# Patient Record
Sex: Male | Born: 1945 | Race: Black or African American | Hispanic: No | Smoking: Former smoker
Health system: Southern US, Community
[De-identification: ages and names within clinical notes are randomized; demographics above are authoritative.]

## PROBLEM LIST (undated history)

## (undated) ENCOUNTER — Emergency Department (HOSPITAL_COMMUNITY): Payer: Commercial Managed Care - HMO | Source: Home / Self Care

## (undated) DIAGNOSIS — J189 Pneumonia, unspecified organism: Secondary | ICD-10-CM

## (undated) DIAGNOSIS — I10 Essential (primary) hypertension: Secondary | ICD-10-CM

## (undated) DIAGNOSIS — Z86010 Personal history of colonic polyps: Secondary | ICD-10-CM

## (undated) DIAGNOSIS — I219 Acute myocardial infarction, unspecified: Secondary | ICD-10-CM

## (undated) DIAGNOSIS — I255 Ischemic cardiomyopathy: Secondary | ICD-10-CM

## (undated) DIAGNOSIS — Z9111 Patient's noncompliance with dietary regimen: Secondary | ICD-10-CM

## (undated) DIAGNOSIS — Z91119 Patient's noncompliance with dietary regimen due to unspecified reason: Secondary | ICD-10-CM

## (undated) DIAGNOSIS — G4733 Obstructive sleep apnea (adult) (pediatric): Secondary | ICD-10-CM

## (undated) DIAGNOSIS — Z9981 Dependence on supplemental oxygen: Secondary | ICD-10-CM

## (undated) DIAGNOSIS — E7800 Pure hypercholesterolemia, unspecified: Secondary | ICD-10-CM

## (undated) DIAGNOSIS — I2510 Atherosclerotic heart disease of native coronary artery without angina pectoris: Secondary | ICD-10-CM

## (undated) DIAGNOSIS — Z8719 Personal history of other diseases of the digestive system: Secondary | ICD-10-CM

## (undated) DIAGNOSIS — R0602 Shortness of breath: Secondary | ICD-10-CM

## (undated) DIAGNOSIS — Z8711 Personal history of peptic ulcer disease: Secondary | ICD-10-CM

## (undated) DIAGNOSIS — K859 Acute pancreatitis without necrosis or infection, unspecified: Secondary | ICD-10-CM

## (undated) DIAGNOSIS — E119 Type 2 diabetes mellitus without complications: Secondary | ICD-10-CM

## (undated) DIAGNOSIS — M1990 Unspecified osteoarthritis, unspecified site: Secondary | ICD-10-CM

## (undated) DIAGNOSIS — K922 Gastrointestinal hemorrhage, unspecified: Secondary | ICD-10-CM

## (undated) DIAGNOSIS — F1021 Alcohol dependence, in remission: Secondary | ICD-10-CM

## (undated) DIAGNOSIS — I350 Nonrheumatic aortic (valve) stenosis: Secondary | ICD-10-CM

## (undated) DIAGNOSIS — K219 Gastro-esophageal reflux disease without esophagitis: Secondary | ICD-10-CM

## (undated) DIAGNOSIS — C8590 Non-Hodgkin lymphoma, unspecified, unspecified site: Secondary | ICD-10-CM

## (undated) DIAGNOSIS — K5790 Diverticulosis of intestine, part unspecified, without perforation or abscess without bleeding: Secondary | ICD-10-CM

## (undated) DIAGNOSIS — I5042 Chronic combined systolic (congestive) and diastolic (congestive) heart failure: Secondary | ICD-10-CM

## (undated) DIAGNOSIS — Z9989 Dependence on other enabling machines and devices: Secondary | ICD-10-CM

## (undated) HISTORY — DX: Personal history of colonic polyps: Z86.010

## (undated) HISTORY — DX: Pneumonia, unspecified organism: J18.9

## (undated) HISTORY — DX: Acute pancreatitis without necrosis or infection, unspecified: K85.90

## (undated) HISTORY — DX: Alcohol dependence, in remission: F10.21

## (undated) HISTORY — DX: Diverticulosis of intestine, part unspecified, without perforation or abscess without bleeding: K57.90

## (undated) HISTORY — DX: Non-Hodgkin lymphoma, unspecified, unspecified site: C85.90

---

## 1979-03-19 DIAGNOSIS — K922 Gastrointestinal hemorrhage, unspecified: Secondary | ICD-10-CM

## 1979-03-19 HISTORY — DX: Gastrointestinal hemorrhage, unspecified: K92.2

## 1998-02-09 ENCOUNTER — Other Ambulatory Visit: Admission: RE | Admit: 1998-02-09 | Discharge: 1998-02-09 | Payer: Self-pay | Admitting: Family Medicine

## 2011-08-07 HISTORY — PX: CARDIAC CATHETERIZATION: SHX172

## 2012-05-18 DIAGNOSIS — I219 Acute myocardial infarction, unspecified: Secondary | ICD-10-CM

## 2012-05-18 DIAGNOSIS — J189 Pneumonia, unspecified organism: Secondary | ICD-10-CM

## 2012-05-18 HISTORY — DX: Acute myocardial infarction, unspecified: I21.9

## 2012-05-18 HISTORY — DX: Pneumonia, unspecified organism: J18.9

## 2012-05-31 ENCOUNTER — Other Ambulatory Visit: Payer: Self-pay | Admitting: Internal Medicine

## 2012-05-31 ENCOUNTER — Other Ambulatory Visit: Payer: Self-pay | Admitting: *Deleted

## 2012-06-05 ENCOUNTER — Other Ambulatory Visit: Payer: Self-pay | Admitting: Internal Medicine

## 2012-06-06 ENCOUNTER — Ambulatory Visit (HOSPITAL_COMMUNITY)
Admission: RE | Admit: 2012-06-06 | Discharge: 2012-06-07 | Disposition: A | Discharge status: Home / Self Care | Payer: Medicare Other | Source: Ambulatory Visit | Attending: Internal Medicine | Admitting: Internal Medicine

## 2012-06-06 ENCOUNTER — Encounter (HOSPITAL_COMMUNITY): Payer: Self-pay | Admitting: General Practice

## 2012-06-06 ENCOUNTER — Encounter (HOSPITAL_COMMUNITY)
Admission: RE | Disposition: A | Discharge status: Home / Self Care | Payer: Self-pay | Source: Ambulatory Visit | Attending: Internal Medicine

## 2012-06-06 DIAGNOSIS — Z955 Presence of coronary angioplasty implant and graft: Secondary | ICD-10-CM

## 2012-06-06 DIAGNOSIS — I255 Ischemic cardiomyopathy: Secondary | ICD-10-CM

## 2012-06-06 DIAGNOSIS — E119 Type 2 diabetes mellitus without complications: Secondary | ICD-10-CM

## 2012-06-06 DIAGNOSIS — D72829 Elevated white blood cell count, unspecified: Secondary | ICD-10-CM | POA: Insufficient documentation

## 2012-06-06 DIAGNOSIS — I350 Nonrheumatic aortic (valve) stenosis: Secondary | ICD-10-CM | POA: Diagnosis present

## 2012-06-06 DIAGNOSIS — I5043 Acute on chronic combined systolic (congestive) and diastolic (congestive) heart failure: Secondary | ICD-10-CM

## 2012-06-06 DIAGNOSIS — I2510 Atherosclerotic heart disease of native coronary artery without angina pectoris: Secondary | ICD-10-CM

## 2012-06-06 DIAGNOSIS — E785 Hyperlipidemia, unspecified: Secondary | ICD-10-CM | POA: Insufficient documentation

## 2012-06-06 DIAGNOSIS — I2589 Other forms of chronic ischemic heart disease: Secondary | ICD-10-CM | POA: Insufficient documentation

## 2012-06-06 DIAGNOSIS — F17200 Nicotine dependence, unspecified, uncomplicated: Secondary | ICD-10-CM | POA: Diagnosis present

## 2012-06-06 DIAGNOSIS — I509 Heart failure, unspecified: Secondary | ICD-10-CM

## 2012-06-06 DIAGNOSIS — I359 Nonrheumatic aortic valve disorder, unspecified: Secondary | ICD-10-CM | POA: Insufficient documentation

## 2012-06-06 DIAGNOSIS — I10 Essential (primary) hypertension: Secondary | ICD-10-CM

## 2012-06-06 HISTORY — DX: Obstructive sleep apnea (adult) (pediatric): G47.33

## 2012-06-06 HISTORY — DX: Pure hypercholesterolemia, unspecified: E78.00

## 2012-06-06 HISTORY — PX: PERCUTANEOUS CORONARY STENT INTERVENTION (PCI-S): SHX5485

## 2012-06-06 HISTORY — DX: Unspecified osteoarthritis, unspecified site: M19.90

## 2012-06-06 HISTORY — DX: Ischemic cardiomyopathy: I25.5

## 2012-06-06 HISTORY — DX: Shortness of breath: R06.02

## 2012-06-06 HISTORY — DX: Essential (primary) hypertension: I10

## 2012-06-06 HISTORY — DX: Dependence on other enabling machines and devices: Z99.89

## 2012-06-06 HISTORY — PX: CORONARY ANGIOPLASTY WITH STENT PLACEMENT: SHX49

## 2012-06-06 HISTORY — DX: Gastrointestinal hemorrhage, unspecified: K92.2

## 2012-06-06 HISTORY — DX: Type 2 diabetes mellitus without complications: E11.9

## 2012-06-06 HISTORY — PX: LEFT AND RIGHT HEART CATHETERIZATION WITH CORONARY ANGIOGRAM: SHX5449

## 2012-06-06 LAB — POCT ACTIVATED CLOTTING TIME: Activated Clotting Time: 364 seconds

## 2012-06-06 LAB — POCT I-STAT 3, VENOUS BLOOD GAS (G3P V)
Acid-base deficit: 1 mmol/L (ref 0.0–2.0)
O2 Saturation: 60 %

## 2012-06-06 LAB — HEMOGLOBIN A1C: Mean Plasma Glucose: 177 mg/dL — ABNORMAL HIGH (ref <117)

## 2012-06-06 LAB — POCT I-STAT 3, ART BLOOD GAS (G3+): pH, Arterial: 7.408 (ref 7.350–7.450)

## 2012-06-06 LAB — GLUCOSE, CAPILLARY
Glucose-Capillary: 110 mg/dL — ABNORMAL HIGH (ref 70–99)
Glucose-Capillary: 194 mg/dL — ABNORMAL HIGH (ref 70–99)

## 2012-06-06 SURGERY — LEFT AND RIGHT HEART CATHETERIZATION WITH CORONARY ANGIOGRAM
Anesthesia: LOCAL

## 2012-06-06 MED ORDER — MIDAZOLAM HCL 2 MG/2ML IJ SOLN
INTRAMUSCULAR | Status: AC
  Filled 2012-06-06: qty 2

## 2012-06-06 MED ORDER — INSULIN ASPART 100 UNIT/ML ~~LOC~~ SOLN
0.0000 [IU] | SUBCUTANEOUS | Status: DC
  Administered 2012-06-06 – 2012-06-07 (×2): 2 [IU] via SUBCUTANEOUS
  Administered 2012-06-07: 1 [IU] via SUBCUTANEOUS
  Administered 2012-06-07: 2 [IU] via SUBCUTANEOUS
  Administered 2012-06-07: 09:00:00 1 [IU] via SUBCUTANEOUS

## 2012-06-06 MED ORDER — SODIUM CHLORIDE 0.9 % IV SOLN
INTRAVENOUS | Status: DC
  Administered 2012-06-06: 16:00:00 via INTRAVENOUS

## 2012-06-06 MED ORDER — ACETAMINOPHEN 325 MG PO TABS: 650.0000 mg | ORAL_TABLET | ORAL | Status: DC | PRN

## 2012-06-06 MED ORDER — LORAZEPAM 0.5 MG PO TABS: 2.0000 mg | ORAL_TABLET | Freq: Four times a day (QID) | ORAL | Status: DC | PRN

## 2012-06-06 MED ORDER — BIVALIRUDIN 250 MG IV SOLR
INTRAVENOUS | Status: AC
  Filled 2012-06-06: qty 250

## 2012-06-06 MED ORDER — FENTANYL CITRATE 0.05 MG/ML IJ SOLN
INTRAMUSCULAR | Status: AC
  Filled 2012-06-06: qty 2

## 2012-06-06 MED ORDER — FUROSEMIDE 20 MG PO TABS: 20.0000 mg | ORAL_TABLET | Freq: Every day | ORAL | Status: DC

## 2012-06-06 MED ORDER — NITROGLYCERIN 0.2 MG/ML ON CALL CATH LAB
INTRAVENOUS | Status: AC
  Filled 2012-06-06: qty 1

## 2012-06-06 MED ORDER — PRASUGREL HCL 10 MG PO TABS
10.0000 mg | ORAL_TABLET | Freq: Every day | ORAL | Status: DC
  Administered 2012-06-07: 10 mg via ORAL
  Filled 2012-06-06 (×2): qty 1

## 2012-06-06 MED ORDER — SODIUM CHLORIDE 0.9 % IJ SOLN: 3.0000 mL | INTRAMUSCULAR | Status: DC | PRN

## 2012-06-06 MED ORDER — LIDOCAINE HCL (PF) 1 % IJ SOLN
INTRAMUSCULAR | Status: AC
  Filled 2012-06-06: qty 30

## 2012-06-06 MED ORDER — ASPIRIN EC 81 MG PO TBEC
81.0000 mg | DELAYED_RELEASE_TABLET | Freq: Every day | ORAL | Status: DC
  Administered 2012-06-07: 81 mg via ORAL
  Filled 2012-06-06 (×2): qty 1

## 2012-06-06 MED ORDER — HEPARIN (PORCINE) IN NACL 2-0.9 UNIT/ML-% IJ SOLN
INTRAMUSCULAR | Status: AC
  Filled 2012-06-06: qty 1000

## 2012-06-06 MED ORDER — PRASUGREL HCL 10 MG PO TABS
ORAL_TABLET | ORAL | Status: AC
  Filled 2012-06-06: qty 6

## 2012-06-06 MED ORDER — ASPIRIN 81 MG PO CHEW
324.0000 mg | CHEWABLE_TABLET | ORAL | Status: AC
  Administered 2012-06-06: 324 mg via ORAL
  Filled 2012-06-06: qty 4

## 2012-06-06 MED ORDER — FENTANYL CITRATE 0.05 MG/ML IJ SOLN
25.0000 ug | Freq: Once | INTRAMUSCULAR | Status: AC
  Administered 2012-06-06: 25 ug via INTRAVENOUS

## 2012-06-06 MED ORDER — METFORMIN HCL 500 MG PO TABS
500.0000 mg | ORAL_TABLET | Freq: Two times a day (BID) | ORAL | Status: DC
  Filled 2012-06-06 (×3): qty 1

## 2012-06-06 MED ORDER — ALPRAZOLAM 0.25 MG PO TABS
0.5000 mg | ORAL_TABLET | Freq: Once | ORAL | Status: AC
  Administered 2012-06-06: 0.5 mg via ORAL

## 2012-06-06 MED ORDER — METFORMIN HCL 500 MG PO TABS: 500.0000 mg | ORAL_TABLET | Freq: Every day | ORAL | Status: DC

## 2012-06-06 MED ORDER — FUROSEMIDE 20 MG PO TABS
20.0000 mg | ORAL_TABLET | Freq: Every day | ORAL | Status: DC
  Filled 2012-06-06: qty 1

## 2012-06-06 MED ORDER — SODIUM CHLORIDE 0.9 % IJ SOLN: 3.0000 mL | Freq: Two times a day (BID) | INTRAMUSCULAR | Status: DC

## 2012-06-06 MED ORDER — NICOTINE 14 MG/24HR TD PT24
14.0000 mg | MEDICATED_PATCH | Freq: Every day | TRANSDERMAL | Status: DC
  Administered 2012-06-06 – 2012-06-07 (×2): 14 mg via TRANSDERMAL
  Filled 2012-06-06 (×2): qty 1

## 2012-06-06 MED ORDER — HEPARIN (PORCINE) IN NACL 2-0.9 UNIT/ML-% IJ SOLN
INTRAMUSCULAR | Status: AC
  Filled 2012-06-06: qty 500

## 2012-06-06 MED ORDER — ATORVASTATIN CALCIUM 20 MG PO TABS
20.0000 mg | ORAL_TABLET | Freq: Every day | ORAL | Status: DC
  Administered 2012-06-06: 20 mg via ORAL
  Filled 2012-06-06 (×2): qty 1

## 2012-06-06 MED ORDER — ONDANSETRON HCL 4 MG/2ML IJ SOLN: 4.0000 mg | Freq: Four times a day (QID) | INTRAMUSCULAR | Status: DC | PRN

## 2012-06-06 MED ORDER — SODIUM CHLORIDE 0.9 % IV SOLN: INTRAVENOUS | Status: DC

## 2012-06-06 MED ORDER — SODIUM CHLORIDE 0.9 % IV SOLN
0.2500 mg/kg/h | INTRAVENOUS | Status: DC
  Filled 2012-06-06: qty 250

## 2012-06-06 MED ORDER — SODIUM CHLORIDE 0.9 % IV SOLN: 250.0000 mL | INTRAVENOUS | Status: DC | PRN

## 2012-06-06 MED ORDER — ALPRAZOLAM 0.25 MG PO TABS
ORAL_TABLET | ORAL | Status: AC
  Filled 2012-06-06: qty 2

## 2012-06-06 NOTE — CV Procedure (Signed)
PCI Note:  LCX OM1 vessel  [name] [name], 66 y.o., male  Full note dictated.  DICTATION # [id number], [id number]  Successful PCI to OM1 branch of LCX with 2.0 x 12 Sprinter balloon, 2.5 X 18 non DES Integrity stent, post dilated to 2.70 mm with 2.75 x 12 [employer] Sprinter balloon with 90 - 95% OM1 stenosis reduced to 0 and improved colaterals to distal superior  Branch of OM1 vessell and improved collaterals to OM2 vessel which is chronically occluded.  Angiomax bolus and infusion; 60 mg Effient; IC NTG.  Tolerated well.  Lennette Bihari, MD, [facility] 06/06/2012 12:35 PM

## 2012-06-06 NOTE — H&P (Signed)
     [facility]          INTERVAL PROCEDURE H&P   History and Physical Interval Note:  06/06/2012 8:40 AM  [name] [name] has presented today for their planned procedure. The various methods of treatment have been discussed with the patient and family. After consideration of risks, benefits and other options for treatment, the patient has consented to the procedure.  The patients' outpatient history has been reviewed, patient examined, and no change in status from most recent office note within the past 30 days. I have reviewed the patients' chart and labs and will proceed as planned. Questions were answered to the patient's satisfaction.   Chrystie Nose, MD, [facility] Attending Cardiologist [facility]  Deion Forgue C 06/06/2012, 8:40 AM

## 2012-06-06 NOTE — CV Procedure (Addendum)
[facility]     CARDIAC CATHETERIZATION REPORT  Ganesh Deeg   [id number] 05/25/1946  Performing Cardiologist: Chrystie Nose Primary Physician: Katharina Caper, N.P. Primary Cardiologist:  Dr. Rennis Golden  Procedures Performed:  Left Heart Catheterization via 5 Fr right femoral artery access, upgraded to a 22F sheath  Right Heart Cathterization via 7 Fr right femoral vein access  Native Coronary Angiography  Transaortic valve pressure gradient measurement  Indication(s): Dyspnea, new onset heart failure, ischemic cardiomyopathy EF 40-45%  History: 66 y.o. male with a history of obesity, long standing diabetes type II, HPL and HTN.  He presented with increasing shortness of breath, weight gain, anasarca and non-productive cough. He was started on diuretics and sent to me for cardiac evaluation. An echocardiogram revealed an LVEF of 40-45% without regional wall motion abnormalities, however, there was pseudonormal diastolic dysfunction and at least moderate aortic stenosis (AVA 1.1 cm2 - peak and mean gradients of 31 mmHg and 17 mmHg with a question of bicuspid aortic valve).  He is referred for left heart catheterization and right heart catheterization and to evaluate the aortic valve gradient.  Consent: The procedure with Risks/Benefits/Alternatives and Indications was reviewed with the patient (and family).  All questions were answered.    Risks / Complications include, but not limited to: Death, MI, CVA/TIA, VF/VT (with defibrillation), Bradycardia (need for temporary pacer placement), contrast induced nephropathy, bleeding / bruising / hematoma / pseudoaneurysm, vascular or coronary injury (with possible emergent CT or Vascular Surgery), adverse medication reactions, infection.    The patient (and family) voice understanding and agree to proceed.    Risks of procedure as well as the alternatives and risks of each were explained to the (patient/caregiver).   Consent for procedure obtained. Consent for signed by MD and patient with RN witness -- placed on chart.  Procedure: The patient was brought to the 2nd Floor [facility] in the fasting state and prepped and draped in the usual sterile fashion for (Right groin) access.  Sterile technique was used including antiseptics, cap, gloves, gown, hand hygiene, mask and sheet.  Skin prep: Chlorhexidine;  Time Out: Verified patient identification, verified procedure, site/side was marked, verified correct patient position, special equipment/implants available, medications/allergies/relevent history reviewed, required imaging and test results available.  Performed  The right femoral head was identified using tactile and fluoroscopic technique.  The right groin was anesthetized with 1% subcutaneous Lidocaine.  The right Common Femoral Artery was accessed using the Modified Seldinger Technique with placement of (5 Fr) sheath using the Seldinger technique.  The sheath was aspirated and flushed.  A 7 Fr venous sheath was similarly placed in the right femoral vein, aspirated and flushed.  A Swan-Ganz catheter was advanced into the RA, RV, PA and PCWP positions. Output was measured by thermodilution and the Fick method. The Swan-Ganz catheter was then used.  A 5 Fr JL4 Catheter was then advanced of over a Standard J wire into the ascending Aorta.  The catheter was used to engage the left coronary artery.  Multiple cineangiographic views of the left coronary artery system(s) were performed. A 5 Fr JR4 Catheter was advanced of over a Safety J wire into the ascending Aorta.  The catheter was used to engage the right coronary artery.  Multiple cineangiographic views of the right coronary artery system(s) were performed. This catheter was used to guide an exchange wire into the left ventricle and an angled Langston catheter was advanced across the Aortic Valve.  LV hemodynamics were measured and  simultaneous pressures across the aortic valve were obtained. The catheter and the wire was removed completely out of the body.  The patient was transferred to the holding area where the sheath was removed with manual pressure held for hemostasis.   The patient was transported to the cath lab holding area in stable condition.   The patient  was stable before, during and following the procedure.   Patient did tolerate procedure well. There were not complications. EBL: <10 cc  Medications:  Premedication: none  Sedation:  3 mg IV Versed, 50 IV mcg Fentanyl  Contrast:  100 cc Omnipaque    Hemodynamics:  Central Aortic Pressure / Mean Aortic Pressure: 127/80  LV Pressure / LV End diastolic Pressure:  32  Right Heart Catheterization:  RA - 10  RV - 57/13  PA - 62/24 (39)  PCWP - 16  TPG - 23  PVR - 8 wood units  PA Sat - 60%  AO Sat - 91%  FCO/CI - 5.42/2.18  TDCO/CI - 2.89/1.16  Langston Aortic Valve gradient (simultaneous) - 17 mmHg  Aortic pullback gradient - 19 mmHg  Calculated AVA - 1-1.1 cm2.  Coronary Angiographic Data:  Left Main:  Calcified, no significant stenosis  Left Anterior Descending (LAD):  Large vessel which coarses around the apex, mild patchy stenoses  1st diagonal (D1):  Moderate sized vessel without significant stenoses  Circumflex (LCx): Large vessel that gives off 2 diagonal branches, there is up to 50% eccentric ostial stenosis.  1st obtuse marginal:  There is a large OM1 vessel with a severe tubular stenosis of 90+% that extends to a bifurcation of the vessel  2nd obtuse marginal:  This appears to be ostially occluded with distal filling from collaterals  3rd obtuse marginal: Small vessel which is ostially occluded.  Right Coronary Artery: There is complete occlusion of the distal RCA with TIMI II flow in the vessel.  right ventricle branch of right coronary artery: There is a large RV marginal branch with 70% ostial stenosis and  homocollaterals to the distal PDA/PLB system  posterior descending artery: fills via distal collaterals from the Right and left systems  posterior lateral branch:  fills via distal collaterals from the Right and left systems  Impression: 1.  2 vessel CAD - apparent CTO of the distal RCA and severe ostial stenosis of a large OM1 branch.  2.  Moderate aortic stenosis - mean gradient of 17 - 19 mmHG, AVA of 1.0-1.1 cm2. 3.  LVEDP = 32 mmHg 4.  Elevated right heart pressures with mild pulmonary venous hypertension (PA mean 39 mmHg - PCWP 16) 5.  Mildly reduced cardiac output.  Plan: 1.  Discussed with Dr. Tresa Endo - given that the aortic valve is not severe and the RCA appears chronic, will proceed with intervention to the OM1 vessel to try to improve LVEF. 2.  Would favor BMS due to questionable compliance issues and if surgery would be necessary to shorten the length of DAPT. 3.  Will need increase in diuretics tomorrow after kidneys have recovered from contrast dye. 4.  Admit post-procedure for diuresis and monitoring of PCI.  The case and results was discussed with the patient (and daughter). The case and results was not discussed with the patient's PCP. The case and results was discussed with the patient's Cardiologist.  Time Spend Directly with Patient:  60 minutes  Chrystie Nose, MD, [facility] Attending Cardiologist [facility]  Carmine Carrozza  C 06/06/2012, 10:55 AM

## 2012-06-06 NOTE — Progress Notes (Signed)
Pt placed on cpap 16 cmH2O with 4L O2 bleed in. He was unaware of settings, but felt this was about right. Placed on full face mask because he said he wears that at home. Pt tolerating well at this time, SpO2-95%.  Jacqulynn Cadet RRT

## 2012-06-07 DIAGNOSIS — E119 Type 2 diabetes mellitus without complications: Secondary | ICD-10-CM | POA: Diagnosis present

## 2012-06-07 DIAGNOSIS — I350 Nonrheumatic aortic (valve) stenosis: Secondary | ICD-10-CM | POA: Diagnosis present

## 2012-06-07 DIAGNOSIS — D72829 Elevated white blood cell count, unspecified: Secondary | ICD-10-CM | POA: Diagnosis present

## 2012-06-07 DIAGNOSIS — I2510 Atherosclerotic heart disease of native coronary artery without angina pectoris: Secondary | ICD-10-CM | POA: Diagnosis present

## 2012-06-07 DIAGNOSIS — E785 Hyperlipidemia, unspecified: Secondary | ICD-10-CM | POA: Diagnosis present

## 2012-06-07 DIAGNOSIS — I10 Essential (primary) hypertension: Secondary | ICD-10-CM | POA: Diagnosis present

## 2012-06-07 DIAGNOSIS — I5043 Acute on chronic combined systolic (congestive) and diastolic (congestive) heart failure: Secondary | ICD-10-CM | POA: Diagnosis present

## 2012-06-07 DIAGNOSIS — F17200 Nicotine dependence, unspecified, uncomplicated: Secondary | ICD-10-CM | POA: Diagnosis present

## 2012-06-07 LAB — GLUCOSE, CAPILLARY
Glucose-Capillary: 147 mg/dL — ABNORMAL HIGH (ref 70–99)
Glucose-Capillary: 195 mg/dL — ABNORMAL HIGH (ref 70–99)
Glucose-Capillary: 152 mg/dL — ABNORMAL HIGH (ref 70–99)

## 2012-06-07 LAB — BASIC METABOLIC PANEL
BUN: 20 mg/dL (ref 6–23)
Calcium: 9.6 mg/dL (ref 8.4–10.5)
GFR calc non Af Amer: 68 mL/min — ABNORMAL LOW (ref >90)
Glucose, Bld: 124 mg/dL — ABNORMAL HIGH (ref 70–99)

## 2012-06-07 LAB — CBC
HCT: 44.7 % (ref 39.0–52.0)
Hemoglobin: 14.8 g/dL (ref 13.0–17.0)
MCH: 29 pg (ref 26.0–34.0)
MCHC: 33.1 g/dL (ref 30.0–36.0)

## 2012-06-07 LAB — PRO B NATRIURETIC PEPTIDE: Pro B Natriuretic peptide (BNP): 897.7 pg/mL — ABNORMAL HIGH (ref 0–125)

## 2012-06-07 MED ORDER — ASPIRIN 81 MG PO TBEC: 81.0000 mg | DELAYED_RELEASE_TABLET | Freq: Every day | ORAL | Status: DC

## 2012-06-07 MED ORDER — METFORMIN HCL 500 MG PO TABS: 500.0000 mg | ORAL_TABLET | Freq: Every day | ORAL | Status: DC

## 2012-06-07 MED ORDER — PRASUGREL HCL 10 MG PO TABS: 10.0000 mg | ORAL_TABLET | Freq: Every day | ORAL | Status: DC

## 2012-06-07 MED ORDER — LISINOPRIL 5 MG PO TABS
5.0000 mg | ORAL_TABLET | Freq: Every day | ORAL | Status: DC
  Administered 2012-06-07: 5 mg via ORAL
  Filled 2012-06-07 (×2): qty 1

## 2012-06-07 MED ORDER — FUROSEMIDE 10 MG/ML IJ SOLN
20.0000 mg | Freq: Once | INTRAMUSCULAR | Status: AC
  Administered 2012-06-07: 09:00:00 20 mg via INTRAVENOUS
  Filled 2012-06-07: qty 2

## 2012-06-07 MED ORDER — LISINOPRIL 5 MG PO TABS: 5.0000 mg | ORAL_TABLET | Freq: Every day | ORAL | Status: DC

## 2012-06-07 MED ORDER — ATORVASTATIN CALCIUM 20 MG PO TABS: 20.0000 mg | ORAL_TABLET | Freq: Every day | ORAL | Status: DC

## 2012-06-07 MED FILL — Dextrose Inj 5%: INTRAVENOUS | Qty: 50 | Status: AC

## 2012-06-07 NOTE — Progress Notes (Signed)
Assessed for utilization review 

## 2012-06-07 NOTE — Care Management Note (Signed)
    Page 1 of 1   06/07/2012     4:38:51 PM   CARE MANAGEMENT NOTE 06/07/2012  Patient:  SHAHIEM, MCMURRY   Account Number:  [id number]  Date Initiated:  06/07/2012  Documentation initiated by:  Tera Mater  Subjective/Objective Assessment:   66yo male admitted for elective cardiac procedure     Action/Plan:   In to speak with pt. about HH needs.  Pt. stated he did not need any assistance at home.  He is able to obtain all his medications and he has a weigh scale at home to weigh himself everyday.   Anticipated DC Date:  06/07/2012   Anticipated DC Plan:  HOME/SELF CARE         Choice offered to / List presented to:             Status of service:  Completed, signed off Medicare Important Message given?   (If response is "NO", the following Medicare IM given date fields will be blank) Date Medicare IM given:   Date Additional Medicare IM given:    Discharge Disposition:  HOME/SELF CARE  Per UR Regulation:  Reviewed for med. necessity/level of care/duration of stay  If discussed at Long Length of Stay Meetings, dates discussed:    Comments:  06/07/12 1630 Pt. states he does not want any assistance at home from [facility] services.  Gave pt. list of HH agencies and made hime aware if he changes his mind, he could speak to his physician and he would order it for him.  Pt. to dc home today.  Tera Mater, RN, BSN Nurse Case Manager [phone]

## 2012-06-07 NOTE — Discharge Summary (Signed)
Physician Discharge Summary  Patient ID: [name] [name] MRN: [id number] DOB/AGE: 1946/03/12 66 y.o.  Admit date: 06/06/2012 Discharge date: 06/07/2012  Admission Diagnoses:  Coronary artery disease, acute on chronic combined systolic and diastolic heart failure  Discharge Diagnoses:  Active Problems:  CAD, CTO RCA with colloat. S/P OM1 BMS 06/06/12  Ischemic cardiomyopathy, EF40-45% 2D 10/13  HTN (hypertension)  Diabetes mellitus, Type 2 NIDDM  Acute on chronic combined systolic and diastolic congestive heart failure  Aortic stenosis, moderate at cath 06/06/12  Dyslipidemia, LDL 105  Leukocytosis  Smoker   Discharged Condition: stable  Hospital Course:   The patient is a 66 year old male with a history of coronary artery disease, ischemic cardiomyopathy with ejection fraction 40-45% by 2-D echocardiogram October 2013, hypertension, diabetes mellitus type 2, chronic combined systolic and diastolic heart failure, moderate aortic stenosis by cath 06/06/2012, dyslipidemia, leukocytosis, tobacco abuse.  He presented at [facility] with increasing shortness of breath, weight gain, anasarca and non-productive cough. He was started on diuretics and sent to Dr. Rennis Golden for cardiac evaluation.  Patient presented to [facility] for a scheduled left and right heart catheterization. See full cath notes below.  Patient ultimately underwent successful PCI to OM1 branch of LCX with 2.0 x 12 Sprinter balloon, 2.5 X 18 non DES Integrity stent after diagnostic catheterization.    He was given IV Lasix x1 the day after the procedure. Case manager was consult and for home health followup.  The patient was seen by Dr. Herbie Baltimore who felt he was stable for discharge home that afternoon. The patient was scheduled for followup within one week's time.  Consults: None  Significant Diagnostic Studies:  Right Heart Catheterization:  RA - 10  RV - 57/13  PA - 62/24 (39)  PCWP - 16    TPG - 23  PVR - 8 wood units  PA Sat - 60%  AO Sat - 91%  FCO/CI - 5.42/2.18  TDCO/CI - 2.89/1.16  Langston Aortic Valve gradient (simultaneous) - 17 mmHg  Aortic pullback gradient - 19 mmHg  Calculated AVA - 1-1.1 cm2.  Coronary Angiographic Data:  Left Main: Calcified, no significant stenosis  Left Anterior Descending (LAD): Large vessel which coarses around the apex, mild patchy stenoses  1st diagonal (D1): Moderate sized vessel without significant stenoses  Circumflex (LCx): Large vessel that gives off 2 diagonal branches, there is up to 50% eccentric ostial stenosis.  1st obtuse marginal: There is a large OM1 vessel with a severe tubular stenosis of 90+% that extends to a bifurcation of the vessel  2nd obtuse marginal: This appears to be ostially occluded with distal filling from collaterals  3rd obtuse marginal: Small vessel which is ostially occluded.  Right Coronary Artery: There is complete occlusion of the distal RCA with TIMI II flow in the vessel.  right ventricle branch of right coronary artery: There is a large RV marginal branch with 70% ostial stenosis and homocollaterals to the distal PDA/PLB system  posterior descending artery: fills via distal collaterals from the Right and left systems  posterior lateral branch: fills via distal collaterals from the Right and left systems Impression:  1. 2 vessel CAD - apparent CTO of the distal RCA and severe ostial stenosis of a large OM1 branch.  2. Moderate aortic stenosis - mean gradient of 17 - 19 mmHG, AVA of 1.0-1.1 cm2.  3. LVEDP = 32 mmHg  4. Elevated right heart pressures with mild pulmonary venous hypertension (PA mean 39 mmHg -  PCWP 16)  5. Mildly reduced cardiac output.  Plan:  1. Discussed with Dr. Tresa Endo - given that the aortic valve is not severe and the RCA appears chronic, will proceed with intervention to the OM1 vessel to try to improve LVEF.  2. Would favor BMS due to questionable compliance issues and if  surgery would be necessary to shorten the length of DAPT.  3. Will need increase in diuretics tomorrow after kidneys have recovered from contrast dye.  4. Admit post-procedure for diuresis and monitoring of PCI.  The case and results was discussed with the patient (and daughter).  The case and results was not discussed with the patient's PCP.  The case and results was discussed with the patient's Cardiologist.  Time Spend Directly with Patient:  60 minutes  Chrystie Nose, MD, [facility]  Attending Cardiologist  [facility]  HILTY,Kenneth C  Full note dictated.  DICTATION # [id number], [id number]  Successful PCI to OM1 branch of LCX with 2.0 x 12 Sprinter balloon, 2.5 X 18 non DES Integrity stent, post dilated to 2.70 mm with 2.75 x 12 [place] Sprinter balloon with 90 - 95% OM1 stenosis reduced to 0 and improved colaterals to distal superior Branch of OM1 vessell and improved collaterals to OM2 vessel which is chronically occluded.  Angiomax bolus and infusion; 60 mg Effient; IC NTG.  Tolerated well.  Lennette Bihari, MD, [facility]  06/06/2012   Treatments: BMS stent, IV lasix  Discharge Exam: Blood pressure 136/77, pulse 82, temperature 98 F (36.7 C), temperature source Oral, resp. rate 27, height 6\' 3"  (1.905 m), weight 124.8 kg (275 lb 2.2 oz), SpO2 100.00%.  Disposition: Final discharge disposition not confirmed  Discharge Orders    Future Orders Please Complete By Expires   Amb Referral to Cardiac Rehabilitation      Diet - low sodium heart healthy      Increase activity slowly      Discharge instructions      Comments:   No lifting more than a half gallon of milk or driving for three days       Medication List     As of 06/07/2012  2:34 PM    TAKE these medications         aspirin 81 MG EC tablet   Take 1 tablet (81 mg total) by mouth daily.      atorvastatin 20 MG tablet   Commonly known as: LIPITOR   Take 1 tablet (20 mg total) by mouth daily at 6  PM.      furosemide 20 MG tablet   Commonly known as: LASIX   Take 20 mg by mouth daily.      lisinopril 5 MG tablet   Commonly known as: PRINIVIL,ZESTRIL   Take 1 tablet (5 mg total) by mouth daily.      metFORMIN 500 MG tablet   Commonly known as: GLUCOPHAGE   Take 1 tablet (500 mg total) by mouth daily with breakfast.      potassium chloride 10 MEQ tablet   Commonly known as: K-DUR,KLOR-CON   Take 10 mEq by mouth 2 (two) times daily.      prasugrel 10 MG Tabs   Commonly known as: EFFIENT   Take 1 tablet (10 mg total) by mouth daily.           Follow-up Information    Follow up with Abelino Derrick, PA. (Our office will call you wit the appt date and time.)  Contact information:   [address] [place] [address] [phone]          Signed: Wilburt Finlay 06/07/2012, 2:34 PM  I saw and evaluated the patient on the morning of discharge. I agree the discharge summary noted above.  Mr. Imhoff seems to be having a hard time absorbing his HF & CAD -- relates long standing ED.  RHC #s demonstrate elevated LVEDP but PCWP of only 16. Mild to Moderate Pulm HTN with TPG of 23 suggests underlying Pulm HTN in addition to LV systolic & diastolic dysfunction related elevated filling pressures.  Cath site is C/D/I - no hematoma. Stable post PCI -- needs ASA & Effient for d/c (minimum DAPT of 1 month).  He is still somewhat edematous & is notes a bit of DOE. BP is also elevated. -- I agree with IV Lasix today & reassess in PM. Will also start ACE-I given CAD, decreased EF & DM.   Elevated WBC is concerning -- he also noted significant sweats overnight, but not at home -- he thinks it has something to do with cath lab sedation (had similar night sweats years ago while an active Alcoholic).   He has a firm "nodule" on the abdominal wall above the umbilicus. ? OP CT Abd/pelvis.   He may well be ready for d/c home later on today, but need to reassess his  ambulation & diuresis. Should remain on PO diuretic for d/c.   Restart metformin 48 hr post cath.  Marykay Lex, M.D., M.S. [facility] [address] [place], [place]  [address]  [phone] Pager # [phone] 06/07/2012

## 2012-06-07 NOTE — Progress Notes (Signed)
CARDIAC REHAB PHASE I   PRE:  Rate/Rhythm: 87SR  BP:  Supine:   Sitting: 136/77  Standing:    SaO2:   MODE:  Ambulation: 600 ft   POST:  Rate/Rhythem: 101ST  BP:  Supine:   Sitting: 158/85  Standing:    SaO2: 95%RA 0750-0909 Pt walked 600 ft on RA with handheld asst with steady gait. Appeared SOB. Pt denied CP and stated he really did not feel that SOB. Education completed. Discussed CHF signs/symptoms and gave CHF packet. Pt states he has scales at home. Encouraged daily weights and when to notify MD. Encouraged smoking cessation and handouts given. Pt quit for 2 years with chantix. To discuss chantix with MD. Discussed importance of watching sodium. Pt seemed a llittle overwhelmed. Permission given to refer to [facility] Phase 2.   Duanne Limerick

## 2012-06-07 NOTE — Cardiovascular Report (Signed)
NAME:  [name], [name] NO.:  [id number]  MEDICAL RECORD NO.:  [id number]  LOCATION:  6531                         FACILITY:  [facility]  PHYSICIAN:  Nicki Guadalajara, M.D.     DATE OF BIRTH:  15-Jun-1946  DATE OF PROCEDURE:  06/06/2012 DATE OF DISCHARGE:                           CARDIAC CATHETERIZATION   INDICATIONS:  Mr. [name] [name] is a 66 year old African American gentleman who underwent right and left heart cardiac catheterization today by Dr. Rennis Golden.  Please refer to Dr. Blanchie Dessert cardiac catheterization report.  In brief, the patient is a 66 year old gentleman who has a longstanding history of diabetes mellitus, obesity, hypertension, who had developed increasing shortness of breath.  He was found to have aortic stenosis and was scheduled for right and left heart catheterization.  At catheterization, he was found have moderate aortic stenosis with distal RCA occlusion after the acute margin with collateralization to his distal RCA via the left system.  His LAD had mild disease.  His circumflex vessel gave rise to an OM vessel that had 90-95% stenosis.  The distal branch of this marginal 1 vessel was also collateralized retrograde.  In addition, the OM-2 vessel was occluded with collateralization noted.  After discussion with Dr. Rennis Golden, the patient is now referred for percutaneous coronary intervention to the OM- 1 vessel.  PROCEDURE:  A 6-French arterial sheath was already in place from his diagnostic procedure.  He received an additional 1 mg of Versed plus 25 mcg of fentanyl x2 for additional sedation.  Angiomax bolus plus infusion was administered.  With the patient being diabetic, he received 60 mg of oral prasugrel for antiplatelet therapy.  ACT was documented to be therapeutic.  A 6-French XB 3.5 guide was used.  Initially, a Prowater wire was advanced into the OM-1 vessel.  Initial attempt was made to cross the distal branch occlusion of this marginal  vessel, but this wire was unable to cross this.  A 2.0 balloon was then inserted and dilatation was done at the initial proximal OM site of 95% stenosis. Again, further attempts were made to see if the wire would cross the distal occlusion, but it did not, and therefore, no further attempt was made at crossing this.  Several inflations were made at the initial site with the 2.0 x 12-mm balloon.  Dr. Rennis Golden had also had discussion with the patient's daughter, who felt the patient had a high likelihood of noncompliance.  Therefore, the decision was made to use a bare-metal rather than drug-eluting stent.  A 2.5 x 18-mm Medtronic Integrity bare- metal stent was then inserted and dilated x2 up to 11 atmospheres.  A 2.75 x 12-mm noncompliant Sprinter balloon was used for poststent dilatation with poststent dilatation up to 2.70 mm.  Scouty angiography confirmed an excellent angiographic result with brisk TIMI-3 flow with improved collateralization, both of the distal branches of this vessel as well as improved collateralization to the OM-2 vessel.  It did appear that there was a long chronic occlusion to the OM-2 vessel.  One brief attempt was made to see if the wire crossed easily to the OM-2 vessel, but since it did not, and due to the probable longstanding chronicity of  this long chronic occlusion, no further attempt was made at this.  The patient tolerated the procedure well.  Plan is to continue him on bivalirudin until the current drip runs out at full dose, which would allow for optimal timing of prasugrel-platelet inhibition.  The patient left the catheterization laboratory with stable hemodynamics.  HEMODYNAMIC DATA:  Central aortic pressure was 125/80.  ANGIOGRAPHIC DATA:  Please refer to Dr. Blanchie Dessert diagnostic cardiac catheterization report.  At the start of the intervention, the left circumflex coronary artery had approximately 40% smooth ostial narrowing.  The vessel gave rise to  a moderate-size OM-1 vessel, which had tubular diffuse stenosis of 90-95% proximally.  In the mid-distal portion of the marginal vessel, there was then another chronic occlusion of a more superior branch with collateralization.  Following PTCA and stenting of this proximal portion of the OM-1 vessel with insertion of a 2.5 x 18-mm Medtronic Integrity bare-metal stent, postdilated to 2.7 mm, the 90-95% stenosis was reduced to 0%.  There was brisk TIMI-3 flow, and there was no evidence for dissection.  At the completion of the procedure, there was improved collateralization to the more distal superior branch of this marginal 1 vessel and improved collateralization to the OM-2 vessel.  IMPRESSION:  Successful percutaneous coronary intervention to the OM-1 vessel of the left circumflex coronary artery with insertion of a 2.5 x 18-mm bare-metal Integrity stent postdilated to 2.7 mm with a 90-95% stenosis being reduced to 0% with improved collateralization to a distal superior branch of this OM-1 vessel and improved collateralization to the OM-2 vessel.          ______________________________ Nicki Guadalajara, M.D.     TK/MEDQ  D:  06/06/2012  T:  06/07/2012  Job:  [id number]  cc:   Italy Hilty, MD Caryl Bis, FNP

## 2012-06-07 NOTE — Progress Notes (Signed)
Subjective:  Denies SOB but cardiac rehab RN noted DOE  Objective:  Vital Signs in the last 24 hours: Temp:  [97.6 F (36.4 C)-98.3 F (36.8 C)] 98 F (36.7 C) (11/21 0806) Pulse Rate:  [79-88] 82  (11/21 0806) Resp:  [18-39] 27  (11/21 0806) BP: (102-159)/(61-92) 136/77 mmHg (11/21 0806) SpO2:  [95 %-100 %] 100 % (11/21 0806) Weight:  [124.8 kg (275 lb 2.2 oz)] 124.8 kg (275 lb 2.2 oz) (11/21 0014)  Intake/Output from previous day:  Intake/Output Summary (Last 24 hours) at 06/07/12 0824 Last data filed at 06/07/12 0500  Gross per 24 hour  Intake   1480 ml  Output   2575 ml  Net  -1095 ml    Physical Exam: General appearance: alert, cooperative, no distress and moderately obese Lungs: few basilar crackles Heart: regular rate and rhythm and 2/6 systolic murmur Rt Groin without hematoma   Rate: 82  Rhythm: normal sinus rhythm  Lab Results:  Basename 06/07/12 0445  WBC 16.1*  HGB 14.8  PLT 188    Basename 06/07/12 0445  NA 141  K 4.6  CL 104  CO2 27  GLUCOSE 124*  BUN 20  CREATININE 1.11   No results found for this basename: TROPONINI:2,CK,MB:2 in the last 72 hours Hepatic Function Panel No results found for this basename: PROT,ALBUMIN,AST,ALT,ALKPHOS,BILITOT,BILIDIR,IBILI in the last 72 hours No results found for this basename: CHOL in the last 72 hours No results found for this basename: INR in the last 72 hours  Imaging: No results found.  Cardiac Studies:  Assessment/Plan:   Active Problems:  CAD, CTO RCA with colloat. S/P OM1 BMS 06/06/12  Acute on chronic combined systolic and diastolic congestive heart failure  Ischemic cardiomyopathy, EF40-45% 2D 10/13  Aortic stenosis, moderate at cath 06/06/12  HTN (hypertension)  Diabetes mellitus, Type 2 NIDDM  Dyslipidemia, LDL 105  Leukocytosis  Plan- He still appears to be a little volume overloaded. Lasix 20mg   IV today. ? Home later today on a diuretic. He needs follow up of elevated WBC which  was also present pre-cath ( ? CLL ). Add ACE -- renal is stable. Resume Glucophage in 48hrs.  Corine Shelter PA-C 06/07/2012, 8:24 AM  I seen and evaluated the patient this morning along with Mr. Diona Fanti, [place]. I agree with his findings, examination as well as impression recommendations.  Mr. Calo seems to be having a hard time absorbing his HF & CAD -- relates long standing ED.    RHC #s demonstrate elevated LVEDP but PCWP of only 16.  Mild to Moderate Pulm HTN with TPG of 23 suggests underlying Pulm HTN in addition to LV systolic & diastolic dysfunction related elevated filling pressures.    Cath site is C/D/I - no hematoma.  Stable post PCI -- needs ASA & Effient for d/c (minimum DAPT of 1 month).  He is still somewhat edematous & is notes a bit of DOE.  BP is also elevated.  -- I agree with IV Lasix today & reassess in PM. Will also start ACE-I given CAD, decreased EF & DM.    Elevated WBC is concerning -- he also noted significant sweats overnight, but not at home -- he thinks it has something to do with cath lab sedation (had similar night sweats years ago while an active Alcoholic).  He has a firm "nodule" on the abdominal wall above the umbilicus.  ? OP CT Abd/pelvis.  He may well be ready for d/c home later on today, but  need to reassess his ambulation & diuresis.  Should remain on PO diuretic for d/c.  Restart metformin 48 hr post cath.  Marykay Lex, M.D., M.S. [facility] [address] [place], [place]  [address]  [phone] Pager # [phone] 06/07/2012 9:37 AM

## 2012-07-06 ENCOUNTER — Encounter (HOSPITAL_COMMUNITY): Payer: Self-pay | Admitting: Emergency Medicine

## 2012-07-06 ENCOUNTER — Encounter: Payer: Self-pay | Admitting: Internal Medicine

## 2012-07-06 ENCOUNTER — Emergency Department (INDEPENDENT_AMBULATORY_CARE_PROVIDER_SITE_OTHER): Payer: Medicare Other

## 2012-07-06 ENCOUNTER — Emergency Department (HOSPITAL_COMMUNITY)
Admission: EM | Admit: 2012-07-06 | Discharge: 2012-07-06 | Disposition: A | Discharge status: Home / Self Care | Payer: Medicare Other | Source: Home / Self Care | Attending: Family Medicine | Admitting: Family Medicine

## 2012-07-06 DIAGNOSIS — K5900 Constipation, unspecified: Secondary | ICD-10-CM

## 2012-07-06 DIAGNOSIS — K5909 Other constipation: Secondary | ICD-10-CM

## 2012-07-06 NOTE — ED Notes (Signed)
Reports abdominal pain for a week now.  Patient received a stent in his heart on 06/06/12.   Admits constipation and vomiting.

## 2012-07-06 NOTE — ED Provider Notes (Signed)
History     CSN: [id number]  Arrival date & time 07/06/12  1227   None     Chief Complaint  Patient presents with  . Abdominal Pain    (Consider location/radiation/quality/duration/timing/severity/associated sxs/prior treatment) Patient is a 66 y.o. male presenting with abdominal pain. The history is provided by the patient.  Abdominal Pain The primary symptoms of the illness include abdominal pain. The primary symptoms of the illness do not include fever, nausea, diarrhea, hematemesis or hematochezia. The current episode started more than 2 days ago. The onset of the illness was gradual. The problem has not changed since onset. The patient has had a change in bowel habit. Additional symptoms associated with the illness include anorexia and constipation. Symptoms associated with the illness do not include heartburn or back pain.    Past Medical History  Diagnosis Date  . Ischemic cardiomyopathy   . Type II diabetes mellitus   . Hypertension   . Hypercholesterolemia   . Heart murmur   . OSA on CPAP   . Shortness of breath     "all the time" (06/06/2012)  . Lower GI bleeding 1980's    "when I was drinking alot" (06/06/2012)  . Arthritis     "both shoulders" (06/06/2012)    Past Surgical History  Procedure Date  . Coronary angioplasty with stent placement 06/06/2012    "1"    History reviewed. No pertinent family history.  History  Substance Use Topics  . Smoking status: Current Every Day Smoker -- 1.0 packs/day for 53 years    Types: Cigarettes  . Smokeless tobacco: Not on file     Comment: 06/06/2012 "refuses smokiong cessation materials; I'm already on Chantix"  . Alcohol Use: Yes     Comment: 06/06/2012 "haven't had a drink in 22 years; I'm an alcoholic"      Review of Systems  Constitutional: Negative.  Negative for fever.  Respiratory: Negative.   Cardiovascular: Negative for chest pain.  Gastrointestinal: Positive for abdominal pain, constipation and  anorexia. Negative for heartburn, nausea, diarrhea, blood in stool, hematochezia, anal bleeding, rectal pain and hematemesis.  Musculoskeletal: Negative for back pain.    Allergies  Review of patient's allergies indicates no known allergies.  Home Medications   Current Outpatient Rx  Name  Route  Sig  Dispense  Refill  . ASPIRIN 81 MG PO TBEC   Oral   Take 1 tablet (81 mg total) by mouth daily.         . ATORVASTATIN CALCIUM 20 MG PO TABS   Oral   Take 1 tablet (20 mg total) by mouth daily at 6 PM.   30 tablet   5   . FUROSEMIDE 20 MG PO TABS   Oral   Take 20 mg by mouth daily.         Marland Kitchen LISINOPRIL 5 MG PO TABS   Oral   Take 1 tablet (5 mg total) by mouth daily.   30 tablet   5   . METFORMIN HCL 500 MG PO TABS   Oral   Take 1 tablet (500 mg total) by mouth daily with breakfast.           Resume this medication on 06/08/12   . POTASSIUM CHLORIDE CRYS ER 10 MEQ PO TBCR   Oral   Take 10 mEq by mouth 2 (two) times daily.         Marland Kitchen PRASUGREL HCL 10 MG PO TABS   Oral   Take 1  tablet (10 mg total) by mouth daily.   30 tablet   10     BP 158/93  Pulse 83  Temp 97.9 F (36.6 C) (Oral)  Resp 18  SpO2 100%  Physical Exam  Nursing note and vitals reviewed. Constitutional: He is oriented to person, place, and time. He appears well-developed and well-nourished. No distress.  HENT:  Head: Normocephalic.  Neck: Normal range of motion. Neck supple.  Cardiovascular: Normal rate and regular rhythm.   Pulmonary/Chest: Effort normal and breath sounds normal.  Abdominal: Soft. Bowel sounds are normal. He exhibits distension. He exhibits no mass. There is no tenderness. There is no rebound and no guarding.  Lymphadenopathy:    He has no cervical adenopathy.  Neurological: He is alert and oriented to person, place, and time.  Skin: Skin is warm and dry.  Psychiatric: He has a normal mood and affect.    ED Course  Procedures (including critical care  time)  Labs Reviewed - No data to display Dg Abd 1 View  07/06/2012  *RADIOLOGY REPORT*  Clinical Data: Abdominal pain extending to back.  ABDOMEN - 1 VIEW  Comparison: None.  Findings: Abnormal small bowel loop right mid abdomen which appears slightly prominent in size with mild fold thickening.  Remainder of bowel gas pattern nonspecific and unremarkable.  Calcifications project over the left kidney which may represent vascular calcifications rather than renal calculi.  The possibility of free intraperitoneal air cannot be addressed on a supine view.  Degenerative changes lower lumbar spine.  IMPRESSION: Abnormal small bowel loop right mid abdomen which appears slightly prominent in size with mild fold thickening.Abnormal small bowel loop right mid abdomen which appears slightly prominent in size with mild fold thickening.   Original Report Authenticated By: Lacy Duverney, M.D.      1. Constipation, chronic       MDM  X-rays reviewed and report per radiologist.         Linna Hoff, MD 07/06/12 [phone]

## 2012-07-08 ENCOUNTER — Inpatient Hospital Stay (HOSPITAL_COMMUNITY)
Admission: EM | Admit: 2012-07-08 | Discharge: 2012-07-14 | DRG: 424 | Disposition: A | Discharge status: Home / Self Care | Payer: Medicare Other | Attending: Family Medicine | Admitting: Family Medicine

## 2012-07-08 ENCOUNTER — Encounter (HOSPITAL_COMMUNITY): Payer: Self-pay | Admitting: Nurse Practitioner

## 2012-07-08 ENCOUNTER — Emergency Department (HOSPITAL_COMMUNITY): Payer: Medicare Other

## 2012-07-08 DIAGNOSIS — I5042 Chronic combined systolic (congestive) and diastolic (congestive) heart failure: Secondary | ICD-10-CM

## 2012-07-08 DIAGNOSIS — M19019 Primary osteoarthritis, unspecified shoulder: Secondary | ICD-10-CM | POA: Diagnosis present

## 2012-07-08 DIAGNOSIS — C259 Malignant neoplasm of pancreas, unspecified: Secondary | ICD-10-CM

## 2012-07-08 DIAGNOSIS — I2582 Chronic total occlusion of coronary artery: Secondary | ICD-10-CM | POA: Diagnosis present

## 2012-07-08 DIAGNOSIS — I509 Heart failure, unspecified: Secondary | ICD-10-CM | POA: Diagnosis present

## 2012-07-08 DIAGNOSIS — I5043 Acute on chronic combined systolic (congestive) and diastolic (congestive) heart failure: Secondary | ICD-10-CM

## 2012-07-08 DIAGNOSIS — E871 Hypo-osmolality and hyponatremia: Secondary | ICD-10-CM | POA: Diagnosis present

## 2012-07-08 DIAGNOSIS — E7800 Pure hypercholesterolemia, unspecified: Secondary | ICD-10-CM | POA: Diagnosis present

## 2012-07-08 DIAGNOSIS — R599 Enlarged lymph nodes, unspecified: Secondary | ICD-10-CM | POA: Diagnosis present

## 2012-07-08 DIAGNOSIS — E669 Obesity, unspecified: Secondary | ICD-10-CM | POA: Diagnosis present

## 2012-07-08 DIAGNOSIS — I255 Ischemic cardiomyopathy: Secondary | ICD-10-CM

## 2012-07-08 DIAGNOSIS — I10 Essential (primary) hypertension: Secondary | ICD-10-CM

## 2012-07-08 DIAGNOSIS — E785 Hyperlipidemia, unspecified: Secondary | ICD-10-CM

## 2012-07-08 DIAGNOSIS — R591 Generalized enlarged lymph nodes: Secondary | ICD-10-CM

## 2012-07-08 DIAGNOSIS — I350 Nonrheumatic aortic (valve) stenosis: Secondary | ICD-10-CM

## 2012-07-08 DIAGNOSIS — R109 Unspecified abdominal pain: Secondary | ICD-10-CM

## 2012-07-08 DIAGNOSIS — K859 Acute pancreatitis without necrosis or infection, unspecified: Principal | ICD-10-CM

## 2012-07-08 DIAGNOSIS — K219 Gastro-esophageal reflux disease without esophagitis: Secondary | ICD-10-CM

## 2012-07-08 DIAGNOSIS — I2589 Other forms of chronic ischemic heart disease: Secondary | ICD-10-CM | POA: Diagnosis present

## 2012-07-08 DIAGNOSIS — Z87891 Personal history of nicotine dependence: Secondary | ICD-10-CM

## 2012-07-08 DIAGNOSIS — I2510 Atherosclerotic heart disease of native coronary artery without angina pectoris: Secondary | ICD-10-CM

## 2012-07-08 DIAGNOSIS — Z9861 Coronary angioplasty status: Secondary | ICD-10-CM

## 2012-07-08 DIAGNOSIS — E119 Type 2 diabetes mellitus without complications: Secondary | ICD-10-CM

## 2012-07-08 DIAGNOSIS — Z79899 Other long term (current) drug therapy: Secondary | ICD-10-CM

## 2012-07-08 DIAGNOSIS — Z7982 Long term (current) use of aspirin: Secondary | ICD-10-CM

## 2012-07-08 DIAGNOSIS — D72829 Elevated white blood cell count, unspecified: Secondary | ICD-10-CM

## 2012-07-08 DIAGNOSIS — I359 Nonrheumatic aortic valve disorder, unspecified: Secondary | ICD-10-CM | POA: Diagnosis present

## 2012-07-08 DIAGNOSIS — F17200 Nicotine dependence, unspecified, uncomplicated: Secondary | ICD-10-CM

## 2012-07-08 DIAGNOSIS — G4733 Obstructive sleep apnea (adult) (pediatric): Secondary | ICD-10-CM | POA: Diagnosis present

## 2012-07-08 HISTORY — DX: Gastro-esophageal reflux disease without esophagitis: K21.9

## 2012-07-08 HISTORY — DX: Chronic combined systolic (congestive) and diastolic (congestive) heart failure: I50.42

## 2012-07-08 LAB — CBC WITH DIFFERENTIAL/PLATELET
Basophils Relative: 0 % (ref 0–1)
Eosinophils Relative: 2 % (ref 0–5)
Hemoglobin: 15.3 g/dL (ref 13.0–17.0)
Lymphs Abs: 8.5 10*3/uL — ABNORMAL HIGH (ref 0.7–4.0)
MCH: 29.5 pg (ref 26.0–34.0)
MCV: 82.4 fL (ref 78.0–100.0)
Monocytes Absolute: 0.5 10*3/uL (ref 0.1–1.0)
Monocytes Relative: 3 % (ref 3–12)
RBC: 5.18 MIL/uL (ref 4.22–5.81)

## 2012-07-08 LAB — COMPREHENSIVE METABOLIC PANEL
AST: 17 U/L (ref 0–37)
Albumin: 4.2 g/dL (ref 3.5–5.2)
Calcium: 10.2 mg/dL (ref 8.4–10.5)
Creatinine, Ser: 0.89 mg/dL (ref 0.50–1.35)
Total Protein: 7.4 g/dL (ref 6.0–8.3)

## 2012-07-08 LAB — URINALYSIS, MICROSCOPIC ONLY
Bilirubin Urine: NEGATIVE
Nitrite: NEGATIVE
Specific Gravity, Urine: 1.01 (ref 1.005–1.030)
pH: 5 (ref 5.0–8.0)

## 2012-07-08 LAB — LIPASE, BLOOD: Lipase: 255 U/L — ABNORMAL HIGH (ref 11–59)

## 2012-07-08 MED ORDER — ASPIRIN EC 81 MG PO TBEC
81.0000 mg | DELAYED_RELEASE_TABLET | Freq: Every day | ORAL | Status: DC
  Filled 2012-07-08 (×2): qty 1

## 2012-07-08 MED ORDER — MORPHINE SULFATE 4 MG/ML IJ SOLN
4.0000 mg | INTRAMUSCULAR | Status: DC | PRN
  Administered 2012-07-08 – 2012-07-09 (×4): 4 mg via INTRAVENOUS
  Filled 2012-07-08 (×4): qty 1

## 2012-07-08 MED ORDER — MORPHINE SULFATE 4 MG/ML IJ SOLN
4.0000 mg | Freq: Once | INTRAMUSCULAR | Status: AC
  Administered 2012-07-08: 4 mg via INTRAVENOUS
  Filled 2012-07-08: qty 1

## 2012-07-08 MED ORDER — IOHEXOL 300 MG/ML  SOLN
20.0000 mL | INTRAMUSCULAR | Status: AC
  Administered 2012-07-08: 20 mL via ORAL

## 2012-07-08 MED ORDER — SODIUM CHLORIDE 0.9 % IJ SOLN
3.0000 mL | Freq: Two times a day (BID) | INTRAMUSCULAR | Status: DC
  Administered 2012-07-10 – 2012-07-14 (×7): 3 mL via INTRAVENOUS

## 2012-07-08 MED ORDER — PRASUGREL HCL 10 MG PO TABS
10.0000 mg | ORAL_TABLET | Freq: Every day | ORAL | Status: DC
  Administered 2012-07-08: 10 mg via ORAL
  Filled 2012-07-08 (×4): qty 1

## 2012-07-08 MED ORDER — SODIUM CHLORIDE 0.9 % IV SOLN
Freq: Once | INTRAVENOUS | Status: AC
  Administered 2012-07-08: 16:00:00 via INTRAVENOUS

## 2012-07-08 MED ORDER — ATORVASTATIN CALCIUM 20 MG PO TABS
20.0000 mg | ORAL_TABLET | Freq: Every day | ORAL | Status: DC
  Administered 2012-07-08 – 2012-07-14 (×7): 20 mg via ORAL
  Filled 2012-07-08 (×7): qty 1

## 2012-07-08 MED ORDER — CARVEDILOL PHOSPHATE ER 10 MG PO CP24
10.0000 mg | ORAL_CAPSULE | Freq: Every day | ORAL | Status: DC
  Administered 2012-07-08 – 2012-07-14 (×7): 10 mg via ORAL
  Filled 2012-07-08 (×10): qty 1

## 2012-07-08 MED ORDER — OXYMETAZOLINE HCL 0.05 % NA SOLN
2.0000 | Freq: Two times a day (BID) | NASAL | Status: DC
  Administered 2012-07-08 – 2012-07-14 (×9): 2 via NASAL
  Filled 2012-07-08: qty 15

## 2012-07-08 MED ORDER — IOHEXOL 300 MG/ML  SOLN
100.0000 mL | Freq: Once | INTRAMUSCULAR | Status: AC | PRN
  Administered 2012-07-08: 100 mL via INTRAVENOUS

## 2012-07-08 MED ORDER — ONDANSETRON HCL 4 MG/2ML IJ SOLN
4.0000 mg | Freq: Once | INTRAMUSCULAR | Status: AC
Start: 2012-07-08 — End: 2012-07-08
  Administered 2012-07-08: 4 mg via INTRAVENOUS
  Filled 2012-07-08: qty 2

## 2012-07-08 NOTE — H&P (Signed)
[facility] History and Physical  [name] [name] ZOX:[id number] DOB: October 07, 1945 DOA: 07/08/2012  Referring physician: Herbert Seta, ED Pa-c PCP: Default, Provider, MD  Consulted Oncology Dr. Florencia Reasons [facility], [facility]  Chief Complaint: Abdominal pain  HPI:  66 yr old [name] with recent hospitalization for ACS s/p Stent to RCA presented 2 weeks ago 12/8 and got constipated.  He eventually went to the [facility] and felt somewhat nauseated, but never threw up and never got anything donw. He wetn to [facility] and was thought to be constipated and was given laxatives. HE has been trying to eat still, he had burning pain in the abdomen and discomfort and has only been able to keep down a boiled egg and half a cup of water-He also managed to keep down a bowl of cereal as well. He developed pain and discomfort in his abdomen, which started 12/22.  The pain radiates from belly to backbone.  He also describes some R shoulder pain as well [although has a h/o frozen shoulder] No fevers. No dark stools. Does have hid GB-has never had a GB attack Hasn't noticed any other swellings either   He is aware of some L neck lymphadenopathy from an [facility] visit in 02/2012  Emergency room workup was performed which revealed a leukocytosis of 17.3, he had a mild hyponatremia of 131.CL of 94  Lipase was 255  his LFTs were within normal limits.   Review of Systems:  He denies any chest pain and shortness of breath. Denies Largent edema and denies dysuria denies fever denies chills denies blurred vision denies double vision no falls no bleeding, no dark stool no parasternal  Past Medical History  Diagnosis Date  . Ischemic cardiomyopathy   . Type II diabetes mellitus   . Hypertension   . Hypercholesterolemia   . Heart murmur   . OSA on CPAP   . Shortness of breath     "all the time" (06/06/2012)  . Lower GI bleeding 1980's    "when I was drinking alot" (06/06/2012)  . Arthritis     "both shoulders"  (06/06/2012)    Independent Chart Review    Past Surgical History  Procedure Date  . Coronary angioplasty with stent placement 06/06/2012    "1"   Social History:  reports that he has quit smoking. His smoking use included Cigarettes. He has a 53 pack-year smoking history. He does not have any smokeless tobacco history on file. He reports that he does not drink alcohol or use illicit drugs.  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 Osteoarthritis Mother   . Osteoarthritis Father     Prior to Admission medications   Medication Sig Start Date End Date Taking? Authorizing Provider  aspirin EC 81 MG EC tablet Take 1 tablet (81 mg total) by mouth daily. 06/07/12  Yes Wilburt Finlay, PA  atorvastatin (LIPITOR) 20 MG tablet Take 1 tablet (20 mg total) by mouth daily at 6 PM. 06/07/12  Yes Wilburt Finlay, PA  furosemide (LASIX) 20 MG tablet Take 20 mg by mouth daily.   Yes Historical Provider, MD  lisinopril (PRINIVIL,ZESTRIL) 5 MG tablet Take 1 tablet (5 mg total) by mouth daily. 06/07/12  Yes Wilburt Finlay, PA  metFORMIN (GLUCOPHAGE) 500 MG tablet Take 1 tablet (500 mg total) by mouth daily with breakfast. 06/07/12  Yes Wilburt Finlay, PA  oxymetazoline (AFRIN) 0.05 % nasal spray Place 2 sprays into the nose 2 (two) times daily as needed. For nasal congestion   Yes Historical Provider, MD  potassium chloride (K-DUR,KLOR-CON) 10 MEQ tablet Take 10 mEq by mouth 2 (two) times daily.   Yes Historical Provider, MD  prasugrel (EFFIENT) 10 MG TABS Take 1 tablet (10 mg total) by mouth daily. 06/07/12  Yes Wilburt Finlay, PA   Physical Exam: Filed Vitals:   07/08/12 1314 07/08/12 1630 07/08/12 1735 07/08/12 1737  BP: 164/98 129/76 134/70   Pulse: 94 84  91  Temp: 97.7 F (36.5 C)     TempSrc: Oral     Resp: 16     SpO2: 98% 96%  97%      General:  Alert oriented pleasant obese African American male  Eyes: No icterus no pallor  ENT: Throat is clear no lymphadenopathy on the right side however some significant lymphadenopathy on the left  Neck: Soft see above  Cardiovascular: S1-S2 no murmur rub or gallop  Respiratory: Clinically clear  Abdomen: Slightly tender in periumbilical lower abdomen no right upper quadrant pain  Skin: Soft nontender  Musculoskeletal: Range of motion intact  Psychiatric: Euthymic  Neurologic: Motor intact  Labs on Admission:  Basic Metabolic Panel:  Lab 07/08/12 6578  NA 131*  K 4.6  CL 94*  CO2 25  GLUCOSE 165*  BUN 11  CREATININE 0.89  CALCIUM 10.2  MG --  PHOS --   Liver Function Tests:  Lab 07/08/12 1321  AST 17  ALT 22  ALKPHOS 86  BILITOT 0.6  PROT 7.4  ALBUMIN 4.2    Lab 07/08/12 1321  LIPASE 255*  AMYLASE --   No results found for this basename: AMMONIA:5 in the last 168 hours CBC:  Lab 07/08/12 1321  WBC 17.3*  NEUTROABS 8.0*  HGB 15.3  HCT 42.7  MCV 82.4  PLT 207   Cardiac Enzymes: No results found for this basename: CKTOTAL:5,CKMB:5,CKMBINDEX:5,TROPONINI:5 in the last 168 hours  BNP (last 3 results)  Basename 06/07/12 0445  PROBNP 897.7*   CBG: No results found for this basename: GLUCAP:5 in the last 168 hours  Radiological Exams on Admission: Ct Abdomen Pelvis W Contrast  07/08/2012  *RADIOLOGY REPORT*  Clinical Data: Severe abdominal pain  CT ABDOMEN AND PELVIS WITH CONTRAST  Technique:  Multidetector CT imaging of the abdomen and pelvis was performed following the standard protocol during bolus administration of intravenous contrast.  Contrast:  100 ml Omnipaque-300  Comparison: Abdomen films of 07/06/2012  Findings: The lung bases are clear.  The liver enhances with no focal abnormality and no ductal dilatation is seen.  No calcified gallstones are seen.  The pancreas enhances with no focal abnormality.  However, there is  significant adenopathy surrounding the head of the pancreas extending to the retroperitoneum and the mesentery.  One of the largest clusters of nodes is just caudal to the head of the pancreas measuring 7.9 x 3.9 cm.  Multiple nodes extend more caudally into the lower abdomen and upper pelvis. These nodes appear homogeneous, and lymphoma would be the primary consideration. There is a probable small right incidental adrenal adenoma present in the left adrenal gland is unremarkable.  The spleen is within normal limits in size.  The stomach is not well distended.  There is indentation upon the distal antrum of the stomach and the duodenum however by clusters of adenopathy.  The kidneys enhance and there is renovascular  calcification present. No definite renal calculi are seen and on delayed images, no hydronephrosis is noted. A fatty attenuation area in the posterior right kidney may represent a small angiomyolipoma.  The abdominal aorta is normal in caliber.  The adenopathy extends into the upper abdomen.  Within the upper pelvis at approximately the level of the iliac crest there are several pathologically enlarged lymph nodes with short-axis diameter over 2 cm.  The urinary bladder is not well distended but is unremarkable.  The prostate is minimally prominent.  There are multiple rectosigmoid colonic diverticula present.  The appendix and terminal ileum are unremarkable.  There are small lymph nodes along the iliac chains bilaterally.  Diffuse degenerative changes noted throughout the lumbar spine.  IMPRESSION:  1.  Adenopathy surrounding the pancreas, and involving the retroperitoneum and mesentery throughout the abdomen and extending into the upper pelvis.  This adenopathy is very homogeneous and most typical of lymphoma. 2.  Multiple rectosigmoid and distal descending colon diverticula. 3.  The appendix and terminal ileum are unremarkable.   Original Report Authenticated By: Dwyane Dee, M.D.     EKG:  Independently reviewed. None performed  Assessment/Plan Principal Problem:  *Pancreatitis Active Problems:  CAD, CTO RCA with colloat. S/P OM1 BMS 06/06/12  Ischemic cardiomyopathy, EF40-45% 2D 10/13  Acute on chronic combined systolic and diastolic congestive heart failure  Aortic stenosis, moderate at cath 06/06/12  Smoker  Primary pancreatic adenocarcinoma??   1. Pancreatitis-secondary to #1-continue IV fluids-repeat lipase a.m. low suspicioun this is GB stone 2. Lymphadenopathy surrounding pancreas-potentially secondary to mass effect. Get a lipase in the morning. We'll consult general surgery in the morning. Patient will need an excisional biopsy per discussion by myself with Dr. Joie Bimler will be involved in his care as per my consult with him tomorrow.  3. Recent CAD with CTO RCA with collateral status post OM1 BMS 06/06/2012-I have called Dr. Herbie Baltimore of [facility] heart and vascular to consult on the patient. Patient is on FE and an aspirin which we continued. Patient will potentially need to be off of this for 7-9 days prior to any type of surgery 4. 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 Leukocytosis-probably secondary to primary process. 6. Diabetes mellitus-placed on sliding scale insulin if blood sugars above 200  Code Status: Full code Family Communication: No family in room Disposition Plan: Telemetry, disposition pending  Time spent: 90 minutes  Mahala Menghini [facility] Pager [phone]  If 7PM-7AM, please contact night-coverage [email or web address] Password [email or web address] 07/08/2012, 6:11 PM

## 2012-07-08 NOTE — ED Notes (Signed)
Pt finished drinking oral CT contrast. Notified CT tech.

## 2012-07-08 NOTE — ED Notes (Signed)
Pt returned from radiology.

## 2012-07-08 NOTE — ED Notes (Signed)
Pt was seen at [facility] for constipation Friday, finally had a bowel movement on Saturday, but continues to have severe abd burning and pain "From the navel to the backbone." pt reports he can not even eat due to pain

## 2012-07-08 NOTE — ED Provider Notes (Signed)
History     CSN: [id number]  Arrival date & time 07/08/12  1308   First MD Initiated Contact with Patient 07/08/12 1322      Chief Complaint  Patient presents with  . Abdominal Pain    (Consider location/radiation/quality/duration/timing/severity/associated sxs/prior treatment) HPI [name] [name] is a 66 y.o. male who presents with complaint of abdominal pain. States pain started about 4-5 days ago. At first felt like was constipated. Had generalized discomfort, worse with eating. Went to an UC 2 days ago, was diagnosed with constipation. Was discharged with laxatives. States once took them, had a large bowel movement yesterday, but pain did not improve. States pain diffuse, sharp, mainly midline radiating into the back. States pain worsens with eating and drinking. Admits to nausea. One episode of vomiting yesterday. Pt denies fever, chills, urinary symptoms.    Past Medical History  Diagnosis Date  . Ischemic cardiomyopathy   . Type II diabetes mellitus   . Hypertension   . Hypercholesterolemia   . Heart murmur   . OSA on CPAP   . Shortness of breath     "all the time" (06/06/2012)  . Lower GI bleeding 1980's    "when I was drinking alot" (06/06/2012)  . Arthritis     "both shoulders" (06/06/2012)    Past Surgical History  Procedure Date  . Coronary angioplasty with stent placement 06/06/2012    "1"    History reviewed. No pertinent family history.  History  Substance Use Topics  . Smoking status: Former Smoker -- 1.0 packs/day for 53 years    Types: Cigarettes  . Smokeless tobacco: Not on file     Comment: 06/06/2012 "refuses smokiong cessation materials; I'm already on Chantix"  . 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  HENT: Negative for neck pain and neck stiffness.   Respiratory: Negative.   Cardiovascular: Negative.    Gastrointestinal: Positive for nausea, vomiting, abdominal pain and constipation. Negative for diarrhea.  Genitourinary: Negative for dysuria, urgency and flank pain.  Musculoskeletal: Negative for back pain.  Skin: Negative.   Neurological: Negative for dizziness, weakness and headaches.  All other systems reviewed and are negative.    Allergies  Review of patient's allergies indicates no known allergies.  Home Medications   Current Outpatient Rx  Name  Route  Sig  Dispense  Refill  . ASPIRIN 81 MG PO TBEC   Oral   Take 1 tablet (81 mg total) by mouth daily.         . ATORVASTATIN CALCIUM 20 MG PO TABS   Oral   Take 1 tablet (20 mg total) by mouth daily at 6 PM.   30 tablet   5   . FUROSEMIDE 20 MG PO TABS   Oral   Take 20 mg by mouth daily.         Marland Kitchen LISINOPRIL 5 MG PO TABS   Oral   Take 1 tablet (5 mg total) by mouth daily.   30 tablet   5   . METFORMIN HCL 500 MG PO TABS   Oral   Take 1 tablet (500 mg total) by mouth daily with breakfast.           Resume this medication on 06/08/12   . OXYMETAZOLINE HCL 0.05 % NA SOLN   Nasal   Place 2 sprays into  the nose 2 (two) times daily as needed. For nasal congestion         . POTASSIUM CHLORIDE CRYS ER 10 MEQ PO TBCR   Oral   Take 10 mEq by mouth 2 (two) times daily.         Marland Kitchen PRASUGREL HCL 10 MG PO TABS   Oral   Take 1 tablet (10 mg total) by mouth daily.   30 tablet   10     BP 164/98  Pulse 94  Temp 97.7 F (36.5 C) (Oral)  Resp 16  SpO2 98%  Physical Exam  Nursing note and vitals reviewed. Constitutional: He is oriented to person, place, and time. He appears well-developed and well-nourished. No distress.  HENT:  Head: Normocephalic.  Eyes: Conjunctivae normal are normal.  Neck: Neck supple.  Cardiovascular: Normal rate, regular rhythm and normal heart sounds.   Pulmonary/Chest: Effort normal and breath sounds normal. No respiratory distress. He has no wheezes. He has no rales.   Abdominal: Soft. Bowel sounds are normal. He exhibits no distension. There is tenderness. There is no rebound and no guarding.       Epigastric tenderness, diffuse tenderness with deep palpation  Musculoskeletal: He exhibits no edema.  Neurological: He is alert and oriented to person, place, and time.  Skin: Skin is warm and dry.  Psychiatric: He has a normal mood and affect. His behavior is normal.    ED Course  Procedures (including critical care time)  Pt with abdominal pain, nausea, for 4 days. Will get labs, ua. Fluids and pain meds ordered.   Results for orders placed during the hospital encounter of 07/08/12  COMPREHENSIVE METABOLIC PANEL      Component Value Range   Sodium 131 (*) 135 - 145 mEq/L   Potassium 4.6  3.5 - 5.1 mEq/L   Chloride 94 (*) 96 - 112 mEq/L   CO2 25  19 - 32 mEq/L   Glucose, Bld 165 (*) 70 - 99 mg/dL   BUN 11  6 - 23 mg/dL   Creatinine, Ser 8.29  0.50 - 1.35 mg/dL   Calcium 56.2  8.4 - 13.0 mg/dL   Total Protein 7.4  6.0 - 8.3 g/dL   Albumin 4.2  3.5 - 5.2 g/dL   AST 17  0 - 37 U/L   ALT 22  0 - 53 U/L   Alkaline Phosphatase 86  39 - 117 U/L   Total Bilirubin 0.6  0.3 - 1.2 mg/dL   GFR calc non Af Amer 88 (*) >90 mL/min   GFR calc Af Amer >90  >90 mL/min  CBC WITH DIFFERENTIAL      Component Value Range   WBC 17.3 (*) 4.0 - 10.5 K/uL   RBC 5.18  4.22 - 5.81 MIL/uL   Hemoglobin 15.3  13.0 - 17.0 g/dL   HCT 86.5  78.4 - 69.6 %   MCV 82.4  78.0 - 100.0 fL   MCH 29.5  26.0 - 34.0 pg   MCHC 35.8  30.0 - 36.0 g/dL   RDW 29.5  28.4 - 13.2 %   Platelets 207  150 - 400 K/uL   Neutrophils Relative 46  43 - 77 %   Lymphocytes Relative 49 (*) 12 - 46 %   Monocytes Relative 3  3 - 12 %   Eosinophils Relative 2  0 - 5 %   Basophils Relative 0  0 - 1 %   Neutro Abs 8.0 (*) 1.7 - 7.7  K/uL   Lymphs Abs 8.5 (*) 0.7 - 4.0 K/uL   Monocytes Absolute 0.5  0.1 - 1.0 K/uL   Eosinophils Absolute 0.3  0.0 - 0.7 K/uL   Basophils Absolute 0.0  0.0 - 0.1 K/uL    WBC Morphology ATYPICAL LYMPHOCYTES    LIPASE, BLOOD      Component Value Range   Lipase 255 (*) 11 - 59 U/L  URINALYSIS, MICROSCOPIC ONLY      Component Value Range   Color, Urine YELLOW  YELLOW   APPearance CLEAR  CLEAR   Specific Gravity, Urine 1.010  1.005 - 1.030   pH 5.0  5.0 - 8.0   Glucose, UA NEGATIVE  NEGATIVE mg/dL   Hgb urine dipstick SMALL (*) NEGATIVE   Bilirubin Urine NEGATIVE  NEGATIVE   Ketones, ur NEGATIVE  NEGATIVE mg/dL   Protein, ur 30 (*) NEGATIVE mg/dL   Urobilinogen, UA 1.0  0.0 - 1.0 mg/dL   Nitrite NEGATIVE  NEGATIVE   Leukocytes, UA NEGATIVE  NEGATIVE   RBC / HPF 0-2  <3 RBC/hpf   Dg Abd 1 View  07/06/2012  *RADIOLOGY REPORT*  Clinical Data: Abdominal pain extending to back.  ABDOMEN - 1 VIEW  Comparison: None.  Findings: Abnormal small bowel loop right mid abdomen which appears slightly prominent in size with mild fold thickening.  Remainder of bowel gas pattern nonspecific and unremarkable.  Calcifications project over the left kidney which may represent vascular calcifications rather than renal calculi.  The possibility of free intraperitoneal air cannot be addressed on a supine view.  Degenerative changes lower lumbar spine.  IMPRESSION: Abnormal small bowel loop right mid abdomen which appears slightly prominent in size with mild fold thickening.Abnormal small bowel loop right mid abdomen which appears slightly prominent in size with mild fold thickening.   Original Report Authenticated By: Lacy Duverney, M.D.     Lipase elevated. X-ray from 07/06/12 as above, will get CT scan for further evaluation. Pt moved to CDU pending CT scan. Pt to be admitted once CT returns.   1. Abdominal pain   2. CAD (coronary artery disease)   3. Diabetes mellitus   4. Pancreatitis       MDM  Pt with abdominal painfor about 4 days now. 2 days ago diagnosed with constipation. Looking at x-ray from that visit, it appears that the read did show several abnormal loops of the  bowel. Pt's pain worsening. WBC found to be elevated. aslo noted lipase elevated.  CT abdomen ordered for further evaluation.         Lottie Mussel, PA 07/10/12 0045

## 2012-07-08 NOTE — ED Provider Notes (Signed)
Assumed care of patient in the CDU.  Patient presents today with a chief complaint of abdominal pain that has been present for the past 4-5 days.  Labs show an elevated Lipase of 255 and an elevated WBC of 17.3 with elevated lymphocytes.  Patient states that he has not had a drink of Alcohol in 22 years.  Patient is to have a CT of the abdomen and pelvis with contrast.  Patient will be admitted once the CT has resulted.  Reassessed patient.  He reports that his pain has returned.  No active vomiting.  Patient is alert and orientated x 3, Heart RRR, Lungs CTAB, Abdomen soft with diffuse tenderness.  5:50 PM CT results are as follows: IMPRESSION:  1. Adenopathy surrounding the pancreas, and involving the retroperitoneum and mesentery throughout the abdomen and extending into the upper pelvis. This adenopathy is very homogeneous and most typical of lymphoma. 2. Multiple rectosigmoid and distal descending colon diverticula. 3. The appendix and terminal ileum are unremarkable.  Discussed results of the CT with patient.  Patient does not have a PCP.  Will page Internal Medicine Unassigned for admission.    6:00 PM Patient discussed with Dr. Mahala Menghini with [facility] who has agreed to see the patient and admit the patient.    Pascal Lux [place], PA-C 07/09/12 1341

## 2012-07-08 NOTE — ED Notes (Signed)
Pt ambulated to restroom with no issues.  

## 2012-07-08 NOTE — Progress Notes (Signed)
Pt admitted to 4700, room 4703 came on stretcher, VSS, c/o of ABD pain 2/10 increasing, Morphine 4mg  IV given as ordered, 1 degree AV block on monitor no distress noticed. Pt on CPAD at bed time of OSA. Pt oriented to the unit and to his room and encouraged to call for assistance to get OOB to prevent any fall or injury while in the hospital. We'll continue with POC.

## 2012-07-09 ENCOUNTER — Inpatient Hospital Stay (HOSPITAL_COMMUNITY): Payer: Medicare Other

## 2012-07-09 DIAGNOSIS — K859 Acute pancreatitis without necrosis or infection, unspecified: Principal | ICD-10-CM

## 2012-07-09 DIAGNOSIS — I2510 Atherosclerotic heart disease of native coronary artery without angina pectoris: Secondary | ICD-10-CM

## 2012-07-09 DIAGNOSIS — E119 Type 2 diabetes mellitus without complications: Secondary | ICD-10-CM

## 2012-07-09 LAB — COMPREHENSIVE METABOLIC PANEL
AST: 21 U/L (ref 0–37)
Albumin: 3.8 g/dL (ref 3.5–5.2)
BUN: 13 mg/dL (ref 6–23)
CO2: 21 mEq/L (ref 19–32)
Calcium: 9.7 mg/dL (ref 8.4–10.5)
Creatinine, Ser: 0.96 mg/dL (ref 0.50–1.35)
GFR calc non Af Amer: 85 mL/min — ABNORMAL LOW (ref >90)

## 2012-07-09 LAB — CBC
HCT: 42.5 % (ref 39.0–52.0)
MCHC: 34.6 g/dL (ref 30.0–36.0)
MCV: 83.8 fL (ref 78.0–100.0)
RBC: 5.07 MIL/uL (ref 4.22–5.81)
RDW: 12.6 % (ref 11.5–15.5)
WBC: 17.5 10*3/uL — ABNORMAL HIGH (ref 4.0–10.5)

## 2012-07-09 LAB — GLUCOSE, CAPILLARY: Glucose-Capillary: 155 mg/dL — ABNORMAL HIGH (ref 70–99)

## 2012-07-09 LAB — PROTIME-INR: INR: 1.15 (ref 0.00–1.49)

## 2012-07-09 LAB — LACTATE DEHYDROGENASE: LDH: 244 U/L (ref 94–250)

## 2012-07-09 LAB — PATHOLOGIST SMEAR REVIEW

## 2012-07-09 MED ORDER — PANTOPRAZOLE SODIUM 40 MG IV SOLR
40.0000 mg | INTRAVENOUS | Status: DC
  Administered 2012-07-09: 40 mg via INTRAVENOUS
  Filled 2012-07-09 (×2): qty 40

## 2012-07-09 MED ORDER — SODIUM CHLORIDE 0.9 % IV SOLN
INTRAVENOUS | Status: DC
  Administered 2012-07-09: 10:00:00 via INTRAVENOUS

## 2012-07-09 MED ORDER — INSULIN ASPART 100 UNIT/ML ~~LOC~~ SOLN
0.0000 [IU] | SUBCUTANEOUS | Status: DC
  Administered 2012-07-09 (×2): 2 [IU] via SUBCUTANEOUS
  Administered 2012-07-09: 3 [IU] via SUBCUTANEOUS
  Administered 2012-07-10 – 2012-07-11 (×4): 1 [IU] via SUBCUTANEOUS
  Administered 2012-07-11: 2 [IU] via SUBCUTANEOUS
  Administered 2012-07-11 – 2012-07-12 (×2): 1 [IU] via SUBCUTANEOUS
  Administered 2012-07-12: 2 [IU] via SUBCUTANEOUS
  Administered 2012-07-12: 3 [IU] via SUBCUTANEOUS
  Administered 2012-07-13: 2 [IU] via SUBCUTANEOUS
  Administered 2012-07-13 (×3): 1 [IU] via SUBCUTANEOUS
  Administered 2012-07-13: 5 [IU] via SUBCUTANEOUS
  Administered 2012-07-14: 1 [IU] via SUBCUTANEOUS
  Administered 2012-07-14 (×2): 2 [IU] via SUBCUTANEOUS

## 2012-07-09 MED ORDER — ONDANSETRON HCL 4 MG/2ML IJ SOLN: 4.0000 mg | Freq: Four times a day (QID) | INTRAMUSCULAR | Status: DC | PRN

## 2012-07-09 MED ORDER — IOHEXOL 300 MG/ML  SOLN
75.0000 mL | Freq: Once | INTRAMUSCULAR | Status: AC | PRN
  Administered 2012-07-09: 75 mL via INTRAVENOUS

## 2012-07-09 MED ORDER — MORPHINE SULFATE 4 MG/ML IJ SOLN
4.0000 mg | INTRAMUSCULAR | Status: DC | PRN
  Administered 2012-07-09 – 2012-07-10 (×6): 4 mg via INTRAVENOUS
  Filled 2012-07-09 (×6): qty 1

## 2012-07-09 NOTE — Consult Note (Signed)
Pt. Seen and examined. Agree with the NP/PA-C note as written.  [name] [name] is an unfortunate patient of mine who had little contact with doctors until recently. He was referred to me for evaluation of a murmur, increasing shortness of breath and weight gain suggestive of heart failure. I performed a L/RHC on 06/06/2012 which showed elevated RH pressures and 2 vessel CAD. The RCA was CTO and an OM branch was severely stenotic. He had a bare metal stent placed.  His AS is moderate, AVA around 1.1 cm2. Given concern about compliance, I recommended a BMS.  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  There is a family history of cancer in 2 uncles, one recently died of colon CA.   He will likely need a lymph node biopsy for diagnosis and staging and may even require surgery at some point.  He had a BMS placed on 06/06/12, therefore I can recommend discontinuing ASA and Effient to allow biopsy at lower risk in the next 7-10 days. With regard to operative risk, I would classify him as intermediate to high risk for surgery (under general anesthesia, not as high of a risk for needle biopsy under local anesthesia), given his CAD, moderate AS and ischemic cardiomyopathy (EF ~45%). I would ensure adequate perioperative b-blockade should he require surgery and be careful about volume overload with fluids.  Thanks for consulting me about [name] [name]. We will be available as needed, but sign-off for now.  Call with questions.  Chrystie Nose, MD, [facility] Attending Cardiologist [facility]

## 2012-07-09 NOTE — Progress Notes (Signed)
Case was discussed with Dr. Mahala Menghini over the phone.  I have recommended a lymph node biopsy first (by surgery if possible). Once pathology is done, full oncology evaluation to follow.

## 2012-07-09 NOTE — Consult Note (Signed)
Reason for Consult: Pre-op clearance  Requesting Physician: Rhetta Mura   HPI: The patient is a 66 year old male with a history of coronary artery disease, ischemic cardiomyopathy with ejection fraction 40-45% by 2-D echocardiogram October 2013, hypertension, diabetes mellitus type 2, chronic combined systolic and diastolic heart failure, moderate aortic stenosis, dyslipidemia, leukocytosis, and tobacco abuse. He was last admitted on 06/06/12 for  increasing shortness of breath, weight gain, anasarca and non-productive cough.  During previous admission, he underwent left and right heart catheterization, which revealed 2 vessel CAD (apparent CTO of the distal RCA and severe ostial stenosis of a large OM1 branch), moderate aortic stenosis (mean gradient of 17 - 19 mmHG, AVA of 1.0-1.1 cm2), LVEDP ( 32 mmHg), elevated right heart pressures with mild pulmonary venous hypertension (PA mean 39 mmHg - PCWP 16), and mildly reduced cardiac output. He ultimately underwent successful PCI  to OM1 branch of LCX with 2.0 x 12 Sprinter balloon, 2.5 X 18 non DES Integrity stent after diagnostic catheterization. He was discharged home on ASA and Effient. Since discharge, he has done well, from a cardiac standpoint. He denies chest pain/SOB. He is now admitted for pancreatitis and may require biopsy.    PMHx:  Past Medical History  Diagnosis Date  . Ischemic cardiomyopathy   . Type II diabetes mellitus   . Hypertension   . Hypercholesterolemia   . Heart murmur   . OSA on CPAP   . Shortness of breath     "all the time" (06/06/2012)  . Lower GI bleeding 1980's    "when I was drinking alot" (06/06/2012)  . Arthritis     "both shoulders" (06/06/2012)   Past Surgical History  Procedure Date  . Coronary angioplasty with stent placement 06/06/2012    "1"    FAMHx: Family History  Problem Relation Age of Onset  . Osteoarthritis Mother   . Osteoarthritis Father     SOCHx:  reports that he has  quit smoking. His smoking use included Cigarettes. He has a 53 pack-year smoking history. He does not have any smokeless tobacco history on file. He reports that he does not drink alcohol or use illicit drugs.  ALLERGIES: No Known Allergies  ROS: Constitutional: positive for anorexia, negative for chills, fevers and weight loss Respiratory: negative for dyspnea on exertion Cardiovascular: negative for chest pain and chest pressure/discomfort Gastrointestinal: positive for abdominal pain  HOME MEDICATIONS: Prescriptions prior to admission  Medication Sig Dispense Refill  . aspirin EC 81 MG EC tablet Take 1 tablet (81 mg total) by mouth daily.      Marland Kitchen atorvastatin (LIPITOR) 20 MG tablet Take 1 tablet (20 mg total) by mouth daily at 6 PM.  30 tablet  5  . furosemide (LASIX) 20 MG tablet Take 20 mg by mouth daily.      Marland Kitchen lisinopril (PRINIVIL,ZESTRIL) 5 MG tablet Take 1 tablet (5 mg total) by mouth daily.  30 tablet  5  . metFORMIN (GLUCOPHAGE) 500 MG tablet Take 1 tablet (500 mg total) by mouth daily with breakfast.      . oxymetazoline (AFRIN) 0.05 % nasal spray Place 2 sprays into the nose 2 (two) times daily as needed. For nasal congestion      . potassium chloride (K-DUR,KLOR-CON) 10 MEQ tablet Take 10 mEq by mouth 2 (two) times daily.      . prasugrel (EFFIENT) 10 MG TABS Take 1 tablet (10 mg total) by mouth daily.  30 tablet  10    HOSPITAL  MEDICATIONS: I have reviewed the patient's current medications.  VITALS: Blood pressure 122/69, pulse 80, temperature 98.1 F (36.7 C), temperature source Oral, resp. rate 18, height 6\' 3"  (1.905 m), weight 122.2 kg (269 lb 6.4 oz), SpO2 93.00%.  PHYSICAL EXAM: General appearance: alert, cooperative, no distress and moderately obese Neck: marked left cervical adenopathy,   Lungs: clear to auscultation bilaterally Heart: regular rate and rhythm and systolic murmur: systolic ejection 2/6, at 2nd left intercostal space Abdomen: soft, non-tender;  bowel sounds normal; no masses,  no organomegaly Extremities: no LEE Pulses: 2+ and symmetric Skin: warm and dry Neurologic: Grossly normal  LABS: Results for orders placed during the hospital encounter of 07/08/12 (from the past 48 hour(s))  COMPREHENSIVE METABOLIC PANEL     Status: Abnormal   Collection Time   07/08/12  1:21 PM      Component Value Range Comment   Sodium 131 (*) 135 - 145 mEq/L    Potassium 4.6  3.5 - 5.1 mEq/L    Chloride 94 (*) 96 - 112 mEq/L    CO2 25  19 - 32 mEq/L    Glucose, Bld 165 (*) 70 - 99 mg/dL    BUN 11  6 - 23 mg/dL    Creatinine, Ser 1.61  0.50 - 1.35 mg/dL    Calcium 09.6  8.4 - 10.5 mg/dL    Total Protein 7.4  6.0 - 8.3 g/dL    Albumin 4.2  3.5 - 5.2 g/dL    AST 17  0 - 37 U/L    ALT 22  0 - 53 U/L    Alkaline Phosphatase 86  39 - 117 U/L    Total Bilirubin 0.6  0.3 - 1.2 mg/dL    GFR calc non Af Amer 88 (*) >90 mL/min    GFR calc Af Amer >90  >90 mL/min   CBC WITH DIFFERENTIAL     Status: Abnormal   Collection Time   07/08/12  1:21 PM      Component Value Range Comment   WBC 17.3 (*) 4.0 - 10.5 K/uL    RBC 5.18  4.22 - 5.81 MIL/uL    Hemoglobin 15.3  13.0 - 17.0 g/dL    HCT 04.5  40.9 - 81.1 %    MCV 82.4  78.0 - 100.0 fL    MCH 29.5  26.0 - 34.0 pg    MCHC 35.8  30.0 - 36.0 g/dL    RDW 91.4  78.2 - 95.6 %    Platelets 207  150 - 400 K/uL    Neutrophils Relative 46  43 - 77 %    Lymphocytes Relative 49 (*) 12 - 46 %    Monocytes Relative 3  3 - 12 %    Eosinophils Relative 2  0 - 5 %    Basophils Relative 0  0 - 1 %    Neutro Abs 8.0 (*) 1.7 - 7.7 K/uL    Lymphs Abs 8.5 (*) 0.7 - 4.0 K/uL    Monocytes Absolute 0.5  0.1 - 1.0 K/uL    Eosinophils Absolute 0.3  0.0 - 0.7 K/uL    Basophils Absolute 0.0  0.0 - 0.1 K/uL    WBC Morphology ATYPICAL LYMPHOCYTES   ABSOLUTE LYMPHOCYTOSIS  LIPASE, BLOOD     Status: Abnormal   Collection Time   07/08/12  1:21 PM      Component Value Range Comment   Lipase 255 (*) 11 - 59 U/L    URINALYSIS,  MICROSCOPIC ONLY     Status: Abnormal   Collection Time   07/08/12  2:07 PM      Component Value Range Comment   Color, Urine YELLOW  YELLOW    APPearance CLEAR  CLEAR    Specific Gravity, Urine 1.010  1.005 - 1.030    pH 5.0  5.0 - 8.0    Glucose, UA NEGATIVE  NEGATIVE mg/dL    Hgb urine dipstick SMALL (*) NEGATIVE    Bilirubin Urine NEGATIVE  NEGATIVE    Ketones, ur NEGATIVE  NEGATIVE mg/dL    Protein, ur 30 (*) NEGATIVE mg/dL    Urobilinogen, UA 1.0  0.0 - 1.0 mg/dL    Nitrite NEGATIVE  NEGATIVE    Leukocytes, UA NEGATIVE  NEGATIVE    RBC / HPF 0-2  <3 RBC/hpf   GLUCOSE, CAPILLARY     Status: Abnormal   Collection Time   07/08/12  9:06 PM      Component Value Range Comment   Glucose-Capillary 140 (*) 70 - 99 mg/dL    Comment 1 Notify RN     COMPREHENSIVE METABOLIC PANEL     Status: Abnormal   Collection Time   07/09/12  4:35 AM      Component Value Range Comment   Sodium 132 (*) 135 - 145 mEq/L    Potassium 4.8  3.5 - 5.1 mEq/L    Chloride 96  96 - 112 mEq/L    CO2 21  19 - 32 mEq/L    Glucose, Bld 134 (*) 70 - 99 mg/dL    BUN 13  6 - 23 mg/dL    Creatinine, Ser 1.61  0.50 - 1.35 mg/dL    Calcium 9.7  8.4 - 09.6 mg/dL    Total Protein 6.9  6.0 - 8.3 g/dL    Albumin 3.8  3.5 - 5.2 g/dL    AST 21  0 - 37 U/L    ALT 21  0 - 53 U/L    Alkaline Phosphatase 86  39 - 117 U/L    Total Bilirubin 0.6  0.3 - 1.2 mg/dL    GFR calc non Af Amer 85 (*) >90 mL/min    GFR calc Af Amer >90  >90 mL/min   CBC     Status: Abnormal   Collection Time   07/09/12  4:35 AM      Component Value Range Comment   WBC 17.5 (*) 4.0 - 10.5 K/uL    RBC 5.07  4.22 - 5.81 MIL/uL    Hemoglobin 14.7  13.0 - 17.0 g/dL    HCT 04.5  40.9 - 81.1 %    MCV 83.8  78.0 - 100.0 fL    MCH 29.0  26.0 - 34.0 pg    MCHC 34.6  30.0 - 36.0 g/dL    RDW 91.4  78.2 - 95.6 %    Platelets 202  150 - 400 K/uL   PROTIME-INR     Status: Normal   Collection Time   07/09/12  4:35 AM      Component Value  Range Comment   Prothrombin Time 14.5  11.6 - 15.2 seconds    INR 1.15  0.00 - 1.49   GLUCOSE, CAPILLARY     Status: Abnormal   Collection Time   07/09/12  5:59 AM      Component Value Range Comment   Glucose-Capillary 140 (*) 70 - 99 mg/dL     IMAGING: Ct Abdomen Pelvis W Contrast  07/08/2012  *  RADIOLOGY REPORT*  Clinical Data: Severe abdominal pain  CT ABDOMEN AND PELVIS WITH CONTRAST  Technique:  Multidetector CT imaging of the abdomen and pelvis was performed following the standard protocol during bolus administration of intravenous contrast.  Contrast:  100 ml Omnipaque-300  Comparison: Abdomen films of 07/06/2012  Findings: The lung bases are clear.  The liver enhances with no focal abnormality and no ductal dilatation is seen.  No calcified gallstones are seen.  The pancreas enhances with no focal abnormality.  However, there is significant adenopathy surrounding the head of the pancreas extending to the retroperitoneum and the mesentery.  One of the largest clusters of nodes is just caudal to the head of the pancreas measuring 7.9 x 3.9 cm.  Multiple nodes extend more caudally into the lower abdomen and upper pelvis. These nodes appear homogeneous, and lymphoma would be the primary consideration. There is a probable small right incidental adrenal adenoma present in the left adrenal gland is unremarkable.  The spleen is within normal limits in size.  The stomach is not well distended.  There is indentation upon the distal antrum of the stomach and the duodenum however by clusters of adenopathy.  The kidneys enhance and there is renovascular calcification present. No definite renal calculi are seen and on delayed images, no hydronephrosis is noted. A fatty attenuation area in the posterior right kidney may represent a small angiomyolipoma.  The abdominal aorta is normal in caliber.  The adenopathy extends into the upper abdomen.  Within the upper pelvis at approximately the level of the iliac  crest there are several pathologically enlarged lymph nodes with short-axis diameter over 2 cm.  The urinary bladder is not well distended but is unremarkable.  The prostate is minimally prominent.  There are multiple rectosigmoid colonic diverticula present.  The appendix and terminal ileum are unremarkable.  There are small lymph nodes along the iliac chains bilaterally.  Diffuse degenerative changes noted throughout the lumbar spine.  IMPRESSION:  1.  Adenopathy surrounding the pancreas, and involving the retroperitoneum and mesentery throughout the abdomen and extending into the upper pelvis.  This adenopathy is very homogeneous and most typical of lymphoma. 2.  Multiple rectosigmoid and distal descending colon diverticula. 3.  The appendix and terminal ileum are unremarkable.   Original Report Authenticated By: Dwyane Dee, M.D.     IMPRESSION: Principal Problem:  *Pancreatitis Active Problems:  CAD, CTO RCA with colloat. S/P OM1 BMS 06/06/12  Acute on chronic combined systolic and diastolic congestive heart failure  Ischemic cardiomyopathy, EF40-45% 2D 10/13  Aortic stenosis, moderate at cath 06/06/12  Smoker  Primary pancreatic adenocarcinoma??   RECOMMENDATION:  Plan: Pt is s/p PCI w/ BMS + Effient therapy x 1 Month. From a cardiac standpoint, he has done well post-op.  It is reasonable to d/c Effient since he has taken it a minimum of 4 weeks, which is the current recommendation for BMS.    Time Spent Directly with Patient: 30 minutes  [name] [name] K 07/09/2012, 9:01 AM

## 2012-07-09 NOTE — Progress Notes (Signed)
[facility] PROGRESS NOTE  Gaetan Spieker NWG:[id number] DOB: 1946-02-14 DOA: 07/08/2012 PCP: Default, Provider, MD  Assessment/Plan: Principal Problem:  *Pancreatitis Active Problems:  CAD, CTO RCA with colloat. S/P OM1 BMS 06/06/12  Ischemic cardiomyopathy, EF40-45% 2D 10/13  Acute on chronic combined systolic and diastolic congestive heart failure  Aortic stenosis, moderate at cath 06/06/12  Smoker  Primary pancreatic adenocarcinoma??   1. Abdominal pain: Patient presented with a 2-week history of abdominal pain, constipation, poor appetite and occasional vomiting. Abdomina/pelvic CT demonstrated significant adenopathy surrounding the pancreas, and involving the retroperitoneum and mesentery throughout the abdomen and extending into the upper pelvis. This adenopathy is very homogeneous and most typical of lymphoma. He also has a peripheral blood lymphocytosis and "shotty" cervical lymphadenopathy, which patient states was first noticed in 02/2012. Have consulted Dr Denny Levy, interventional radiology for possible CT-guided lymph node biopsy, and he has recommended chest CT scan. We shall check serum LDH as well as peripheral blood flow cytometry. Further management will depend on pathology findings. Dr Clelia Croft has been consulted.  2. Pancreatitis: Patient was found to have a Lipase of 255 at presentation. LFTs are normal. He states that he quit drinking about 2 months ago, and imaging studies show no evidence of biliary or pancreatic ductal dilatation. Abdominal exam appears quite benign. He may have a mild pancreatitis, which is already resolving clinically. Will manage with bowel rest, PPI, anti-emetics and analgesics. Following Lipase levels. 3. CAD s/p Stent to RCA with collateral status post OM1 BMS 06/06/2012. Patient appears clinically stable/asymptomatic from this view point. Dr. Herbie Baltimore of [facility] heart [facility] vascular, consulted. Patient is on Prasugrel  and an aspirin which we  continued. These will likely need to be placed on hold, prior to biopsy. 4. CHF: Patient has known history of combined systolic/diastolic CHF. No clinical evidence of decompensation at this time. Will need to watch volume status closely.  5. OSA: Stable on CPAP.  6. Hyponatremia; This is mild, and likely secondary to poor oral intakesodium 131 low chloride 94-probably secondary to poor by mouth intake and diuretic therapy. Following lytes. 7. Diabetes mellitus:  Type 2. Metformin is on hold. Managing with SSI and CBGs are reasonable.    Code Status: Full Code.  Family Communication:  Disposition Plan: To be determined.    Brief narrative: 66 yr old [name] with known history of HTN, dyslipidemia, DM-2, OSA-on CPAP, OA, lower GI bleed, combined systolic/diastolic CHF, moderated aortic stenosis, CAD/ischemic cardiomyopathy, s/p hospitalization 06/06/12-06/07/12 for ACS s/p Stent to RCA presenting with abdominal pain and constipation over the past 2 weeks, despite visits to [facility]. He has been unable to keep food down. He eventually came to the ED on 07/08/12, where work up revealed a eukocytosis of 17.3, mild hyponatremia of 131.CL of 94 and Lipase was 255. He was admitted for further management.    Consultants:  Cardiology/[facility].  Jetty Peeks, MD, IR.  Dr Eli Hose, Hem-Onc.   Procedures:  Abdominal X-Ray.   CT abdomen/pelvis.   Antibiotics:  N/A.   HPI/Subjective: No new issues this AM.   Objective: Vital signs in last 24 hours: Temp:  [97.7 F (36.5 C)-98.1 F (36.7 C)] 98.1 F (36.7 C) (12/23 0533) Pulse Rate:  [80-94] 80  (12/23 0533) Resp:  [16-18] 18  (12/23 0533) BP: (122-164)/(64-98) 122/69 mmHg (12/23 0533) SpO2:  [93 %-100 %] 93 % (12/23 0533) Weight:  [122.018 kg (269 lb)-122.2 kg (269 lb 6.4 oz)] 122.2 kg (269 lb 6.4 oz) (12/23 0533) Weight change:  Last BM Date: 07/08/12  Intake/Output from previous day: 12/22 0701 - 12/23 0700 In: -  Out: 600  [Urine:600]     Physical Exam: General: Comfortable, alert, communicative, fully oriented, not short of breath at rest.  HEENT:  No clinical pallor, no jaundice, no conjunctival injection or discharge. Hydration is satisfactory.  NECK:  Supple, JVP not seen, no carotid bruits, shotty cervical lymphadenopathy, no palpable goiter. CHEST:  Clinically clear to auscultation, no wheezes, no crackles. No palpable axillary lymphadenopathy. HEART:  Sounds 1 and 2 heard, normal, regular, no murmurs. ABDOMEN:  Moderately obese, soft, non-tender, no palpable organomegaly, no palpable masses, normal bowel sounds. GENITALIA:  Not examined. LOWER EXTREMITIES:  No pitting edema, palpable peripheral pulses. MUSCULOSKELETAL SYSTEM:  Unremarkable.  CENTRAL NERVOUS SYSTEM:  No focal neurologic deficit on gross examination.  Lab Results:  Basename 07/09/12 0435 07/08/12 1321  WBC 17.5* 17.3*  HGB 14.7 15.3  HCT 42.5 42.7  PLT 202 207    Basename 07/09/12 0435 07/08/12 1321  NA 132* 131*  K 4.8 4.6  CL 96 94*  CO2 21 25  GLUCOSE 134* 165*  BUN 13 11  CREATININE 0.96 0.89  CALCIUM 9.7 10.2   No results found for this or any previous visit (from the past 240 hour(s)).   Studies/Results: Ct Abdomen Pelvis W Contrast  07/08/2012  *RADIOLOGY REPORT*  Clinical Data: Severe abdominal pain  CT ABDOMEN AND PELVIS WITH CONTRAST  Technique:  Multidetector CT imaging of the abdomen and pelvis was performed following the standard protocol during bolus administration of intravenous contrast.  Contrast:  100 ml Omnipaque-300  Comparison: Abdomen films of 07/06/2012  Findings: The lung bases are clear.  The liver enhances with no focal abnormality and no ductal dilatation is seen.  No calcified gallstones are seen.  The pancreas enhances with no focal abnormality.  However, there is significant adenopathy surrounding the head of the pancreas extending to the retroperitoneum and the mesentery.  One of the largest  clusters of nodes is just caudal to the head of the pancreas measuring 7.9 x 3.9 cm.  Multiple nodes extend more caudally into the lower abdomen and upper pelvis. These nodes appear homogeneous, and lymphoma would be the primary consideration. There is a probable small right incidental adrenal adenoma present in the left adrenal gland is unremarkable.  The spleen is within normal limits in size.  The stomach is not well distended.  There is indentation upon the distal antrum of the stomach and the duodenum however by clusters of adenopathy.  The kidneys enhance and there is renovascular calcification present. No definite renal calculi are seen and on delayed images, no hydronephrosis is noted. A fatty attenuation area in the posterior right kidney may represent a small angiomyolipoma.  The abdominal aorta is normal in caliber.  The adenopathy extends into the upper abdomen.  Within the upper pelvis at approximately the level of the iliac crest there are several pathologically enlarged lymph nodes with short-axis diameter over 2 cm.  The urinary bladder is not well distended but is unremarkable.  The prostate is minimally prominent.  There are multiple rectosigmoid colonic diverticula present.  The appendix and terminal ileum are unremarkable.  There are small lymph nodes along the iliac chains bilaterally.  Diffuse degenerative changes noted throughout the lumbar spine.  IMPRESSION:  1.  Adenopathy surrounding the pancreas, and involving the retroperitoneum and mesentery throughout the abdomen and extending into the upper pelvis.  This adenopathy is very homogeneous and most  typical of lymphoma. 2.  Multiple rectosigmoid and distal descending colon diverticula. 3.  The appendix and terminal ileum are unremarkable.   Original Report Authenticated By: Dwyane Dee, M.D.     Medications: Scheduled Meds:   . aspirin EC  81 mg Oral Daily  . atorvastatin  20 mg Oral q1800  . carvedilol  10 mg Oral Daily  .  oxymetazoline  2 spray Each Nare BID  . prasugrel  10 mg Oral Daily  . sodium chloride  3 mL Intravenous Q12H   Continuous Infusions:  PRN Meds:.morphine injection    LOS: 1 day   Henok Heacock,CHRISTOPHER  [facility] Pager [phone]. If 8PM-8AM, please contact night-coverage at [email or web address], password [facility] 07/09/2012, 8:21 AM  LOS: 1 day

## 2012-07-10 ENCOUNTER — Encounter (HOSPITAL_COMMUNITY): Payer: Self-pay | Admitting: Internal Medicine

## 2012-07-10 DIAGNOSIS — I5042 Chronic combined systolic (congestive) and diastolic (congestive) heart failure: Secondary | ICD-10-CM

## 2012-07-10 DIAGNOSIS — R599 Enlarged lymph nodes, unspecified: Secondary | ICD-10-CM

## 2012-07-10 DIAGNOSIS — I509 Heart failure, unspecified: Secondary | ICD-10-CM

## 2012-07-10 DIAGNOSIS — R591 Generalized enlarged lymph nodes: Secondary | ICD-10-CM | POA: Diagnosis present

## 2012-07-10 HISTORY — DX: Chronic combined systolic (congestive) and diastolic (congestive) heart failure: I50.42

## 2012-07-10 LAB — CBC
MCV: 83.6 fL (ref 78.0–100.0)
Platelets: 230 10*3/uL (ref 150–400)
RBC: 5.19 MIL/uL (ref 4.22–5.81)
WBC: 17.7 10*3/uL — ABNORMAL HIGH (ref 4.0–10.5)

## 2012-07-10 LAB — GLUCOSE, CAPILLARY
Glucose-Capillary: 104 mg/dL — ABNORMAL HIGH (ref 70–99)
Glucose-Capillary: 110 mg/dL — ABNORMAL HIGH (ref 70–99)
Glucose-Capillary: 114 mg/dL — ABNORMAL HIGH (ref 70–99)
Glucose-Capillary: 121 mg/dL — ABNORMAL HIGH (ref 70–99)
Glucose-Capillary: 134 mg/dL — ABNORMAL HIGH (ref 70–99)

## 2012-07-10 LAB — COMPREHENSIVE METABOLIC PANEL
ALT: 22 U/L (ref 0–53)
AST: 21 U/L (ref 0–37)
Albumin: 4.3 g/dL (ref 3.5–5.2)
Alkaline Phosphatase: 92 U/L (ref 39–117)
CO2: 25 mEq/L (ref 19–32)
Chloride: 95 mEq/L — ABNORMAL LOW (ref 96–112)
Creatinine, Ser: 1.09 mg/dL (ref 0.50–1.35)
GFR calc non Af Amer: 69 mL/min — ABNORMAL LOW (ref >90)
Potassium: 4.6 mEq/L (ref 3.5–5.1)
Sodium: 134 mEq/L — ABNORMAL LOW (ref 135–145)
Total Bilirubin: 0.7 mg/dL (ref 0.3–1.2)

## 2012-07-10 LAB — LIPASE, BLOOD: Lipase: 96 U/L — ABNORMAL HIGH (ref 11–59)

## 2012-07-10 MED ORDER — PANTOPRAZOLE SODIUM 40 MG IV SOLR
40.0000 mg | Freq: Two times a day (BID) | INTRAVENOUS | Status: DC
  Administered 2012-07-10 – 2012-07-11 (×3): 40 mg via INTRAVENOUS
  Filled 2012-07-10 (×5): qty 40

## 2012-07-10 MED ORDER — MORPHINE SULFATE 4 MG/ML IJ SOLN
4.0000 mg | INTRAMUSCULAR | Status: DC | PRN
  Administered 2012-07-10 – 2012-07-13 (×7): 4 mg via INTRAVENOUS
  Filled 2012-07-10 (×7): qty 1

## 2012-07-10 MED ORDER — ASPIRIN 81 MG PO CHEW
81.0000 mg | CHEWABLE_TABLET | Freq: Every day | ORAL | Status: DC
  Administered 2012-07-10 – 2012-07-14 (×4): 81 mg via ORAL
  Filled 2012-07-10 (×4): qty 1

## 2012-07-10 MED ORDER — FLEET ENEMA 7-19 GM/118ML RE ENEM
1.0000 | ENEMA | Freq: Every day | RECTAL | Status: DC | PRN
  Filled 2012-07-10: qty 1

## 2012-07-10 MED ORDER — SODIUM CHLORIDE 0.9 % IV SOLN
INTRAVENOUS | Status: AC
  Administered 2012-07-10: 1000 mL via INTRAVENOUS

## 2012-07-10 MED ORDER — SODIUM CHLORIDE 0.9 % IV SOLN
INTRAVENOUS | Status: DC
  Administered 2012-07-10: 20:00:00 via INTRAVENOUS
  Administered 2012-07-11: 75 mL/h via INTRAVENOUS
  Administered 2012-07-11: 01:00:00 via INTRAVENOUS

## 2012-07-10 MED ORDER — DOCUSATE SODIUM 100 MG PO CAPS
100.0000 mg | ORAL_CAPSULE | Freq: Two times a day (BID) | ORAL | Status: DC
  Administered 2012-07-10 – 2012-07-14 (×7): 100 mg via ORAL
  Filled 2012-07-10 (×10): qty 1

## 2012-07-10 NOTE — Progress Notes (Signed)
[facility] PROGRESS NOTE  [name] [name] UJW:[id number] DOB: 03/01/1946 DOA: 07/08/2012 PCP: Default, Provider, MD  Assessment/Plan: Principal Problem:  *Pancreatitis Active Problems:  CAD, CTO RCA with colloat. S/P OM1 BMS 06/06/12  Ischemic cardiomyopathy, EF40-45% 2D 10/13  HTN (hypertension)  Diabetes mellitus, Type 2 NIDDM  Aortic stenosis, moderate at cath 06/06/12  Dyslipidemia, LDL 105  Leukocytosis  Smoker  Primary pancreatic adenocarcinoma??  Lymphadenopathy  Chronic combined systolic and diastolic CHF (congestive heart failure)   1. Abdominal pain: Patient presented with a 2-week history of abdominal pain, constipation, poor appetite and occasional vomiting. Abdomina/pelvic CT demonstrated significant adenopathy surrounding the pancreas, and involving the retroperitoneum and mesentery throughout the abdomen and extending into the upper pelvis. This adenopathy is very homogeneous and most typical of lymphoma. He also has a peripheral blood lymphocytosis and "shotty" cervical lymphadenopathy, which patient states was first noticed in 02/2012. Have consulted Dr Denny Levy, interventional radiology for possible CT-guided lymph node biopsy, and he has recommended chest CT scan. CT scan of the chest has been done. Serum LDH is within normal limits at 244.  Peripheral blood flow cytometry pending. Further management will depend on pathology findings. Per Dr Clelia Croft once pathology is done for lymph node biopsy for oncology evaluation to follow.  2. Pancreatitis: Patient was found to have a Lipase of 255 at presentation. LFTs are normal. He states that he quit drinking about 2 months ago, and imaging studies show no evidence of biliary or pancreatic ductal dilatation. Abdominal exam appears quite benign. He may have a mild pancreatitis, which is already resolving clinically. Patient tried clear liquids with worsening abdominal pain. We'll make patient n.p.o, increase PPI to twice a day,  anti-emetics and analgesics. Following Lipase levels. 3. CAD s/p Stent to RCA with collateral status post OM1 BMS 06/06/2012. Patient appears clinically stable/asymptomatic from this view point. Dr. Herbie Baltimore of [facility] heart and vascular, consulted. Patient is on Prasugrel  and an aspirin which we continued. Per cardiology patient had a bare-metal stent placed 06/06/2012 and has had approximately a month of antiplatelet therapy and cardiology recommended discontinuing aspirin and effient to allow biopsy of the lower risk in the next 7-10 days. Will discontinue effient. Will continue aspirin as per interventional radiology okay for patient to be on aspirin. Per cardiology. 4. CHF: Patient has known history of combined systolic/diastolic CHF. No clinical evidence of decompensation at this time. Will need to watch volume status closely.  5. OSA: Stable on CPAP.  6. Hyponatremia; This is mild, and likely secondary to poor oral intake. Slowly improving with hydration. Sodium 131 low chloride 94 on admission-probably secondary to poor by mouth intake and diuretic therapy. Following lytes. 7. Diabetes mellitus:  Type 2. Hemoglobin A1c 7.8 on 06/06/2012. CBGs have ranged from 114-155. Metformin is on hold. Managing with SSI.  8.leukocytosis Likely secondary to lymphadenopathy versus pancreatitis. Urinalysis is negative for UTI. CT chest is negative for any acute infiltrate however shows bulky supraclavicular subpectoral axillary mediastinal and hilar lymphadenopathy. No need for antibiotics at this time. We'll monitor.  Code Status: Full Code.  Family Communication: Updated patient at bedside. No family present. Disposition Plan: To be determined.    Brief narrative: 66 yr old [name] with known history of HTN, dyslipidemia, DM-2, OSA-on CPAP, OA, lower GI bleed, combined systolic/diastolic CHF, moderated aortic stenosis, CAD/ischemic cardiomyopathy, s/p hospitalization 06/06/12-06/07/12 for ACS s/p Stent to  RCA presenting with abdominal pain and constipation over the past 2 weeks, despite visits to [facility]. He has been unable  to keep food down. He eventually came to the ED on 07/08/12, where work up revealed a eukocytosis of 17.3, mild hyponatremia of 131.CL of 94 and Lipase was 255. He was admitted for further management.    Consultants:  Cardiology/[facility].  Jetty Peeks, MD, IR.  Dr Eli Hose, Hem-Onc.   Procedures:  Abdominal X-Ray.   CT abdomen/pelvis.   CT of the abdomen and chest 07/09/2012  Antibiotics:  N/A.   HPI/Subjective: Patient states pain medication and not lasting long enough. Patient still with abdominal pain. Patient stated clear liquids worsened his abdominal pain and feels like a burning sensation. No nausea no vomiting. Patient is frustrated about being here and when he cannot have his biopsy.  Objective: Vital signs in last 24 hours: Temp:  [97.4 F (36.3 C)-98.5 F (36.9 C)] 98.5 F (36.9 C) (12/24 0600) Pulse Rate:  [77-91] 91  (12/24 0600) Resp:  [18] 18  (12/24 0600) BP: (133-156)/(92-100) 133/92 mmHg (12/24 0600) SpO2:  [92 %-96 %] 95 % (12/24 0600) Weight:  [119.931 kg (264 lb 6.4 oz)] 119.931 kg (264 lb 6.4 oz) (12/24 0600) Weight change: -2.087 kg (-4 lb 9.6 oz) Last BM Date: 07/08/12  Intake/Output from previous day: 12/23 0701 - 12/24 0700 In: 1940 [P.O.:960; I.V.:980] Out: 1250 [Urine:1250]     Physical Exam: General: Comfortable, alert, communicative, fully oriented, not short of breath at rest. Frustrated. HEENT:  No clinical pallor, no jaundice, no conjunctival injection or discharge. Hydration is satisfactory.  NECK:  Supple, JVP not seen, no carotid bruits, shotty cervical lymphadenopathy, no palpable goiter. CHEST:  Clinically clear to auscultation, no wheezes, no crackles. No palpable axillary lymphadenopathy. HEART:  Sounds 1 and 2 heard, normal, regular, no murmurs. ABDOMEN:  Moderately obese, soft, non-tender, no palpable  organomegaly, no palpable masses, normal bowel sounds. NTTP patient is status post pain medications.. GENITALIA:  Not examined. LOWER EXTREMITIES:  No pitting edema, palpable peripheral pulses. MUSCULOSKELETAL SYSTEM:  Unremarkable.  CENTRAL NERVOUS SYSTEM:  No focal neurologic deficit on gross examination.  Lab Results:  [facility] 07/10/12 0610 07/09/12 0435  WBC 17.7* 17.5*  HGB 15.0 14.7  HCT 43.4 42.5  PLT 230 202    Basename 07/10/12 0610 07/09/12 0435  NA 134* 132*  K 4.6 4.8  CL 95* 96  CO2 25 21  GLUCOSE 139* 134*  BUN 14 13  CREATININE 1.09 0.96  CALCIUM 9.8 9.7   No results found for this or any previous visit (from the past 240 hour(s)).   Studies/Results: Ct Chest W Contrast  07/09/2012  *RADIOLOGY REPORT*  Clinical Data: Lymphadenopathy.  CT CHEST WITH CONTRAST  Technique:  Multidetector CT imaging of the chest was performed following the standard protocol during bolus administration of intravenous contrast.  Contrast: 75mL OMNIPAQUE IOHEXOL 300 MG/ML  SOLN  Comparison: Abdominal CT scan 07/08/2012.  Findings: The chest wall demonstrates bulky supraclavicular, subpectoral and axillary lymphadenopathy.  The thyroid gland is grossly normal.  The bony thorax is intact.  No destructive bone lesions or spinal canal compromise.  There are advanced degenerative changes involving the shoulder joints and there are moderate degenerative changes involving the thoracic spine.  The heart is normal in size.  No pericardial effusion.  The aorta is normal in caliber.  Mild atherosclerotic calcifications. Coronary artery calcifications are noted.  There is bulky mediastinal and hilar lymphadenopathy. The esophagus is grossly normal.  Examination of the lung parenchyma demonstrates no acute pulmonary findings.  No worrisome pulmonary mass lesions or nodules.  No pleural effusion.  The upper abdomen demonstrates bulky adenopathy.  IMPRESSION:  1.  Bulky supraclavicular, subpectoral, axillary,  mediastinal and hilar lymphadenopathy. 2.  No acute pulmonary findings or worrisome pulmonary nodules.   Original Report Authenticated By: Rudie Meyer, M.D.    Ct Abdomen Pelvis W Contrast  07/08/2012  *RADIOLOGY REPORT*  Clinical Data: Severe abdominal pain  CT ABDOMEN AND PELVIS WITH CONTRAST  Technique:  Multidetector CT imaging of the abdomen and pelvis was performed following the standard protocol during bolus administration of intravenous contrast.  Contrast:  100 ml Omnipaque-300  Comparison: Abdomen films of 07/06/2012  Findings: The lung bases are clear.  The liver enhances with no focal abnormality and no ductal dilatation is seen.  No calcified gallstones are seen.  The pancreas enhances with no focal abnormality.  However, there is significant adenopathy surrounding the head of the pancreas extending to the retroperitoneum and the mesentery.  One of the largest clusters of nodes is just caudal to the head of the pancreas measuring 7.9 x 3.9 cm.  Multiple nodes extend more caudally into the lower abdomen and upper pelvis. These nodes appear homogeneous, and lymphoma would be the primary consideration. There is a probable small right incidental adrenal adenoma present in the left adrenal gland is unremarkable.  The spleen is within normal limits in size.  The stomach is not well distended.  There is indentation upon the distal antrum of the stomach and the duodenum however by clusters of adenopathy.  The kidneys enhance and there is renovascular calcification present. No definite renal calculi are seen and on delayed images, no hydronephrosis is noted. A fatty attenuation area in the posterior right kidney may represent a small angiomyolipoma.  The abdominal aorta is normal in caliber.  The adenopathy extends into the upper abdomen.  Within the upper pelvis at approximately the level of the iliac crest there are several pathologically enlarged lymph nodes with short-axis diameter over 2 cm.  The  urinary bladder is not well distended but is unremarkable.  The prostate is minimally prominent.  There are multiple rectosigmoid colonic diverticula present.  The appendix and terminal ileum are unremarkable.  There are small lymph nodes along the iliac chains bilaterally.  Diffuse degenerative changes noted throughout the lumbar spine.  IMPRESSION:  1.  Adenopathy surrounding the pancreas, and involving the retroperitoneum and mesentery throughout the abdomen and extending into the upper pelvis.  This adenopathy is very homogeneous and most typical of lymphoma. 2.  Multiple rectosigmoid and distal descending colon diverticula. 3.  The appendix and terminal ileum are unremarkable.   Original Report Authenticated By: Dwyane Dee, M.D.     Medications: Scheduled Meds:    . aspirin  81 mg Oral Daily  . atorvastatin  20 mg Oral q1800  . carvedilol  10 mg Oral Daily  . docusate sodium  100 mg Oral BID  . insulin aspart  0-9 Units Subcutaneous Q4H  . oxymetazoline  2 spray Each Nare BID  . pantoprazole (PROTONIX) IV  40 mg Intravenous Q12H  . sodium chloride  3 mL Intravenous Q12H   Continuous Infusions:    . sodium chloride 50 mL/hr at 07/09/12 1024   PRN Meds:.morphine injection, ondansetron (ZOFRAN) IV, sodium phosphate    LOS: 2 days   [facility]  [facility] Pager [phone]. If 8PM-8AM, please contact night-coverage at [email or web address], password [facility] 07/10/2012, 9:35 AM  LOS: 2 days

## 2012-07-10 NOTE — Progress Notes (Signed)
Spoke with pt's daughter Rosaline this am at 0840 regarding pt's procedure and called CT who notified no order at this time.  Also notified Dr. Janee Morn.  MD will f/u.  Will continue to monitor.  Samora Jernberg,RN.

## 2012-07-10 NOTE — Consult Note (Signed)
Reason for Consult:  Refer for possible open biopsy for patient with widespread LAD Referring Physician: Dr. Ardean Larsen [name] [name] is an 66 y.o. male.  HPI:  66 yr old AA male with recent hospitalization for ACS s/p stent to RCA presented 2 weeks ago 12/8 and more recently became constipated and nauseated after returning home.  He eventually went to the [facility] UC after 1 week, he was given laxatives. Despite his symptoms he continued to try and eat, but he would developed pain and discomfort in his abdomen, which started 12/22. The pain radiates from belly to backbone. No fevers/chills, fatigue, dark stools.  No past abdominal surgeries, he still has his appendix and GB and never had an attack, no history of any hernias.    Back in August 2013 he saw UC for some swelling in his legs/groin/testicles.  He was given penicillin and lasix which resolved the issue.  At the time they noticed enlarged palpable lymphnodes.  Today he is able to point out that his lymph nodes in his neck are enlarged and mildly tender.    Workup revealed as of today a leukocytosis of 17.7, and lipase was 255 now down to 96 his LFTs were within normal limits.   Past Medical History  Diagnosis Date  . Ischemic cardiomyopathy   . Type II diabetes mellitus   . Hypertension   . Hypercholesterolemia   . Heart murmur   . OSA on CPAP   . Shortness of breath     "all the time" (06/06/2012)  . Lower GI bleeding 1980's    "when I was drinking alot" (06/06/2012)  . Arthritis     "both shoulders" (06/06/2012)  . Chronic combined systolic and diastolic CHF (congestive heart failure) 07/10/2012    Past Surgical History  Procedure Date  . Coronary angioplasty with stent placement 06/06/2012    "1"    Family History  Problem Relation Age of Onset  . Osteoarthritis Mother   . Osteoarthritis Father     Social History:  reports that he has quit smoking. His smoking use included Cigarettes. He has a 53 pack-year smoking  history. He does not have any smokeless tobacco history on file. 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  He denies any illicit drug use.  Allergies: No Known Allergies, but allergic to asbestos?  Medications: I have reviewed the patient's current medications.  Results for orders placed during the hospital encounter of 07/08/12 (from the past 48 hour(s))  URINALYSIS, MICROSCOPIC ONLY     Status: Abnormal   Collection Time   07/08/12  2:07 PM      Component Value Range Comment   Color, Urine YELLOW  YELLOW    APPearance CLEAR  CLEAR    Specific Gravity, Urine 1.010  1.005 - 1.030    pH 5.0  5.0 - 8.0    Glucose, UA NEGATIVE  NEGATIVE mg/dL    Hgb urine dipstick SMALL (*) NEGATIVE    Bilirubin Urine NEGATIVE  NEGATIVE    Ketones, ur NEGATIVE  NEGATIVE mg/dL    Protein, ur 30 (*) NEGATIVE mg/dL    Urobilinogen, UA 1.0  0.0 - 1.0 mg/dL    Nitrite NEGATIVE  NEGATIVE    Leukocytes, UA NEGATIVE  NEGATIVE    RBC / HPF 0-2  <3 RBC/hpf   GLUCOSE, CAPILLARY     Status: Abnormal   Collection Time   07/08/12  9:06 PM      Component Value Range Comment   Glucose-Capillary 140 (*) 70 - 99 mg/dL    Comment 1 Notify RN     COMPREHENSIVE METABOLIC PANEL     Status: Abnormal   Collection Time   07/09/12  4:35 AM      Component Value Range Comment   Sodium 132 (*) 135 - 145 mEq/L    Potassium 4.8  3.5 - 5.1 mEq/L    Chloride 96  96 - 112 mEq/L    CO2 21  19 - 32 mEq/L    Glucose, Bld 134 (*) 70 - 99 mg/dL    BUN 13  6 - 23 mg/dL    Creatinine, Ser 1.19  0.50 - 1.35 mg/dL    Calcium 9.7  8.4 - 14.7 mg/dL    Total Protein 6.9  6.0 - 8.3 g/dL    Albumin 3.8  3.5 - 5.2 g/dL    AST 21  0 - 37 U/L    ALT 21  0 - 53 U/L    Alkaline Phosphatase 86  39 - 117 U/L    Total Bilirubin 0.6  0.3 - 1.2 mg/dL    GFR calc non Af Amer 85 (*) >90 mL/min    GFR calc Af Amer >90  >90 mL/min   CBC     Status: Abnormal   Collection  Time   07/09/12  4:35 AM      Component Value Range Comment   WBC 17.5 (*) 4.0 - 10.5 K/uL    RBC 5.07  4.22 - 5.81 MIL/uL    Hemoglobin 14.7  13.0 - 17.0 g/dL    HCT 82.9  56.2 - 13.0 %    MCV 83.8  78.0 - 100.0 fL    MCH 29.0  26.0 - 34.0 pg    MCHC 34.6  30.0 - 36.0 g/dL    RDW 86.5  78.4 - 69.6 %    Platelets 202  150 - 400 K/uL   PROTIME-INR     Status: Normal   Collection Time   07/09/12  4:35 AM      Component Value Range Comment   Prothrombin Time 14.5  11.6 - 15.2 seconds    INR 1.15  0.00 - 1.49   GLUCOSE, CAPILLARY     Status: Abnormal   Collection Time   07/09/12  5:59 AM      Component Value Range Comment   Glucose-Capillary 140 (*) 70 - 99 mg/dL   LACTATE DEHYDROGENASE     Status: Normal   Collection Time   07/09/12  9:17 AM      Component Value Range Comment   LDH 244  94 - 250 U/L   LIPASE, BLOOD     Status: Abnormal   Collection Time   07/09/12  9:17 AM      Component Value Range Comment   Lipase 118 (*) 11 - 59 U/L   GLUCOSE, CAPILLARY     Status: Abnormal   Collection Time   07/09/12 11:33 AM      Component Value Range Comment   Glucose-Capillary 167 (*) 70 - 99 mg/dL    Comment 1 Notify RN     GLUCOSE, CAPILLARY     Status: Abnormal   Collection Time   07/09/12  4:34 PM      Component Value Range Comment   Glucose-Capillary 155 (*) 70 - 99 mg/dL    Comment 1 Notify RN  GLUCOSE, CAPILLARY     Status: Abnormal   Collection Time   07/09/12  8:15 PM      Component Value Range Comment   Glucose-Capillary 155 (*) 70 - 99 mg/dL   GLUCOSE, CAPILLARY     Status: Abnormal   Collection Time   07/10/12 12:04 AM      Component Value Range Comment   Glucose-Capillary 114 (*) 70 - 99 mg/dL   GLUCOSE, CAPILLARY     Status: Abnormal   Collection Time   07/10/12  4:12 AM      Component Value Range Comment   Glucose-Capillary 136 (*) 70 - 99 mg/dL   LIPASE, BLOOD     Status: Abnormal   Collection Time   07/10/12  6:10 AM      Component Value Range  Comment   Lipase 96 (*) 11 - 59 U/L   CBC     Status: Abnormal   Collection Time   07/10/12  6:10 AM      Component Value Range Comment   WBC 17.7 (*) 4.0 - 10.5 K/uL    RBC 5.19  4.22 - 5.81 MIL/uL    Hemoglobin 15.0  13.0 - 17.0 g/dL    HCT 40.9  81.1 - 91.4 %    MCV 83.6  78.0 - 100.0 fL    MCH 28.9  26.0 - 34.0 pg    MCHC 34.6  30.0 - 36.0 g/dL    RDW 78.2  95.6 - 21.3 %    Platelets 230  150 - 400 K/uL   COMPREHENSIVE METABOLIC PANEL     Status: Abnormal   Collection Time   07/10/12  6:10 AM      Component Value Range Comment   Sodium 134 (*) 135 - 145 mEq/L    Potassium 4.6  3.5 - 5.1 mEq/L    Chloride 95 (*) 96 - 112 mEq/L    CO2 25  19 - 32 mEq/L    Glucose, Bld 139 (*) 70 - 99 mg/dL    BUN 14  6 - 23 mg/dL    Creatinine, Ser 0.86  0.50 - 1.35 mg/dL    Calcium 9.8  8.4 - 57.8 mg/dL    Total Protein 7.4  6.0 - 8.3 g/dL    Albumin 4.3  3.5 - 5.2 g/dL    AST 21  0 - 37 U/L    ALT 22  0 - 53 U/L    Alkaline Phosphatase 92  39 - 117 U/L    Total Bilirubin 0.7  0.3 - 1.2 mg/dL    GFR calc non Af Amer 69 (*) >90 mL/min    GFR calc Af Amer 80 (*) >90 mL/min   GLUCOSE, CAPILLARY     Status: Abnormal   Collection Time   07/10/12  7:37 AM      Component Value Range Comment   Glucose-Capillary 125 (*) 70 - 99 mg/dL    Comment 1 Notify RN     GLUCOSE, CAPILLARY     Status: Abnormal   Collection Time   07/10/12 11:36 AM      Component Value Range Comment   Glucose-Capillary 110 (*) 70 - 99 mg/dL    Comment 1 Notify RN       Ct Chest W Contrast  07/09/2012  *RADIOLOGY REPORT*  Clinical Data: Lymphadenopathy.  CT CHEST WITH CONTRAST  Technique:  Multidetector CT imaging of the chest was performed following the standard protocol during bolus administration of intravenous contrast.  Contrast: 75mL OMNIPAQUE IOHEXOL 300 MG/ML  SOLN  Comparison: Abdominal CT scan 07/08/2012.  Findings: The chest wall demonstrates bulky supraclavicular, subpectoral and axillary lymphadenopathy.   The thyroid gland is grossly normal.  The bony thorax is intact.  No destructive bone lesions or spinal canal compromise.  There are advanced degenerative changes involving the shoulder joints and there are moderate degenerative changes involving the thoracic spine.  The heart is normal in size.  No pericardial effusion.  The aorta is normal in caliber.  Mild atherosclerotic calcifications. Coronary artery calcifications are noted.  There is bulky mediastinal and hilar lymphadenopathy. The esophagus is grossly normal.  Examination of the lung parenchyma demonstrates no acute pulmonary findings.  No worrisome pulmonary mass lesions or nodules.  No pleural effusion.  The upper abdomen demonstrates bulky adenopathy.  IMPRESSION:  1.  Bulky supraclavicular, subpectoral, axillary, mediastinal and hilar lymphadenopathy. 2.  No acute pulmonary findings or worrisome pulmonary nodules.   Original Report Authenticated By: Rudie Meyer, M.D.    Ct Abdomen Pelvis W Contrast  07/08/2012  *RADIOLOGY REPORT*  Clinical Data: Severe abdominal pain  CT ABDOMEN AND PELVIS WITH CONTRAST  Technique:  Multidetector CT imaging of the abdomen and pelvis was performed following the standard protocol during bolus administration of intravenous contrast.  Contrast:  100 ml Omnipaque-300  Comparison: Abdomen films of 07/06/2012  Findings: The lung bases are clear.  The liver enhances with no focal abnormality and no ductal dilatation is seen.  No calcified gallstones are seen.  The pancreas enhances with no focal abnormality.  However, there is significant adenopathy surrounding the head of the pancreas extending to the retroperitoneum and the mesentery.  One of the largest clusters of nodes is just caudal to the head of the pancreas measuring 7.9 x 3.9 cm.  Multiple nodes extend more caudally into the lower abdomen and upper pelvis. These nodes appear homogeneous, and lymphoma would be the primary consideration. There is a probable small  right incidental adrenal adenoma present in the left adrenal gland is unremarkable.  The spleen is within normal limits in size.  The stomach is not well distended.  There is indentation upon the distal antrum of the stomach and the duodenum however by clusters of adenopathy.  The kidneys enhance and there is renovascular calcification present. No definite renal calculi are seen and on delayed images, no hydronephrosis is noted. A fatty attenuation area in the posterior right kidney may represent a small angiomyolipoma.  The abdominal aorta is normal in caliber.  The adenopathy extends into the upper abdomen.  Within the upper pelvis at approximately the level of the iliac crest there are several pathologically enlarged lymph nodes with short-axis diameter over 2 cm.  The urinary bladder is not well distended but is unremarkable.  The prostate is minimally prominent.  There are multiple rectosigmoid colonic diverticula present.  The appendix and terminal ileum are unremarkable.  There are small lymph nodes along the iliac chains bilaterally.  Diffuse degenerative changes noted throughout the lumbar spine.  IMPRESSION:  1.  Adenopathy surrounding the pancreas, and involving the retroperitoneum and mesentery throughout the abdomen and extending into the upper pelvis.  This adenopathy is very homogeneous and most typical of lymphoma. 2.  Multiple rectosigmoid and distal descending colon diverticula. 3.  The appendix and terminal ileum are unremarkable.   Original Report Authenticated By: Dwyane Dee, M.D.     ROS Blood pressure 119/76, pulse 92, temperature 98.5 F (36.9 C), temperature source Oral, resp. rate  18, height 6\' 3"  (1.905 m), weight 264 lb 6.4 oz (119.931 kg), SpO2 96.00%.  Physical Exam General: pleasant, WD/WN AA male, NAD HEENT: head is normocephalic, atraumatic.  Sclera are noninjected.  PERRL.  Ears and nose without any masses or lesions.  Mouth is pink and moist Neck:  +LAD anterior/posterior  cervical, supraclavicular, and axillary Heart: regular, rate, and rhythm. +murmur Lungs: CTAB, no wheezes, rhonchi, or rales noted.  Respiratory effort nonlabored Abd: Soft, NT/ND, diminished BS, no masses, hernias, or organomegaly Ext:  Mild b/l leg edema, good pulses Psych: A&Ox3 with an appropriate affect.   Assessment/Plan: 1.  Pancreatitis - cont NPO, per medicine treatments 2.  Widespread lymphadenopathy, leukocytosis (17.7) possible lymphoma   - Will need biopsied, oncologist Dr. Clelia Croft recommends surgical biopsy as opposed to IR guided biopsy  - IR per note would be willing to biopsy depending on what surgery recommends  - Patient aware that he may have lymphoma and understands we need to get biopsy results back for confirmation  - Patient on day 2 of holding Effient will need to hold 7-10 days before IR or surgery will consider an operation/procedure  - Long discussion with patient about him needing to stay her until his pancreatitis is resolved and medicine/cardiology think its   safe for him to return home.  He may be able to be discharged and brought back if he improves over the next day or 2.  - One factor limiting the patients biopsy is his CAD and heard disease as well as his limited flexion in b/l shoulders due to frozen  Shoulders if approach would be axillary lymph nodes.    - Will discuss with Dr. Jamey Ripa about potential for biopsy and time frame.     DORT, Zariya Minner 07/10/2012, 1:48 PM

## 2012-07-10 NOTE — Progress Notes (Signed)
Patient ID: [name] [name], male   DOB: May 20, 1946, 66 y.o.   MRN: [id number]  Request has been made for IR to perform LAN bx by [facility]  Imaging has been reviewed by Dr Lowella Dandy Pt does have lymphadenopathy that is amenable to percutaneous bx  Imaging most suspicious for lymphoma This type of bx requires core samples  Pt must be off Effient 7 days to safely perform core bx  Earliest we could perform bx is 12/30 Can be done as OP if necessary  Dr Lowella Dandy recommends surgical consult for poss excisional bx This is also Dr Clelia Croft recommendation per note in chart  Let us know as to decision and Rec of surgery. Dr Janee Morn aware and agreeable

## 2012-07-10 NOTE — ED Provider Notes (Signed)
Medical screening examination/treatment/procedure(s) were performed by non-physician practitioner and as supervising physician I was immediately available for consultation/collaboration.   Gwyneth Sprout, MD 07/10/12 [phone]
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
## 2012-07-10 NOTE — Progress Notes (Signed)
Utilization Review Completed.   Laquinta Hazell, RN, BSN Nurse Case Manager  [phone]  

## 2012-07-10 NOTE — ED Provider Notes (Signed)
Medical screening examination/treatment/procedure(s) were performed by non-physician practitioner and as supervising physician I was immediately available for consultation/collaboration.   Aniston Christman, MD 07/10/12 0418 

## 2012-07-10 NOTE — Progress Notes (Signed)
Pt had 3 beats of non sustain VT.  Asymptomatic.  BP 150/92, HR 80.  L. Kilroy informed.  Instructed to call if HR is > 20.  Will continue to monitor.  Amanda Pea, Charity fundraiser.

## 2012-07-11 LAB — CBC WITH DIFFERENTIAL/PLATELET
Basophils Absolute: 0 10*3/uL (ref 0.0–0.1)
Eosinophils Relative: 4 % (ref 0–5)
Lymphocytes Relative: 55 % — ABNORMAL HIGH (ref 12–46)
Lymphs Abs: 7.7 10*3/uL — ABNORMAL HIGH (ref 0.7–4.0)
Monocytes Relative: 4 % (ref 3–12)
Neutrophils Relative %: 37 % — ABNORMAL LOW (ref 43–77)
Platelets: 211 10*3/uL (ref 150–400)
RBC: 4.99 MIL/uL (ref 4.22–5.81)
RDW: 12.6 % (ref 11.5–15.5)
WBC: 14.1 10*3/uL — ABNORMAL HIGH (ref 4.0–10.5)

## 2012-07-11 LAB — GLUCOSE, CAPILLARY
Glucose-Capillary: 99 mg/dL (ref 70–99)
Glucose-Capillary: 91 mg/dL (ref 70–99)

## 2012-07-11 LAB — COMPREHENSIVE METABOLIC PANEL
AST: 22 U/L (ref 0–37)
Albumin: 4 g/dL (ref 3.5–5.2)
Alkaline Phosphatase: 84 U/L (ref 39–117)
BUN: 15 mg/dL (ref 6–23)
Chloride: 98 mEq/L (ref 96–112)
Potassium: 4.2 mEq/L (ref 3.5–5.1)
Total Bilirubin: 0.6 mg/dL (ref 0.3–1.2)
Total Protein: 6.9 g/dL (ref 6.0–8.3)

## 2012-07-11 LAB — LIPASE, BLOOD: Lipase: 73 U/L — ABNORMAL HIGH (ref 11–59)

## 2012-07-11 MED ORDER — LISINOPRIL 5 MG PO TABS
5.0000 mg | ORAL_TABLET | Freq: Every day | ORAL | Status: DC
  Administered 2012-07-11 – 2012-07-14 (×3): 5 mg via ORAL
  Filled 2012-07-11 (×4): qty 1

## 2012-07-11 MED ORDER — POLYETHYLENE GLYCOL 3350 17 G PO PACK
17.0000 g | PACK | Freq: Every day | ORAL | Status: DC | PRN
  Filled 2012-07-11: qty 1

## 2012-07-11 MED ORDER — SIMETHICONE 80 MG PO CHEW
160.0000 mg | CHEWABLE_TABLET | Freq: Four times a day (QID) | ORAL | Status: DC | PRN
  Filled 2012-07-11: qty 2

## 2012-07-11 NOTE — Progress Notes (Signed)
[facility] PROGRESS NOTE  [name] [name] EAV:[id number] DOB: 05/21/1946 DOA: 07/08/2012 PCP: Default, Provider, MD  Assessment/Plan: Principal Problem:  *Pancreatitis Active Problems:  CAD, CTO RCA with colloat. S/P OM1 BMS 06/06/12  Ischemic cardiomyopathy, EF40-45% 2D 10/13  HTN (hypertension)  Diabetes mellitus, Type 2 NIDDM  Aortic stenosis, moderate at cath 06/06/12  Dyslipidemia, LDL 105  Leukocytosis  Smoker  Primary pancreatic adenocarcinoma??  Lymphadenopathy  Chronic combined systolic and diastolic CHF (congestive heart failure)   1. Abdominal pain: Patient presented with a 2-week history of abdominal pain, constipation, poor appetite and occasional vomiting. Abdomina/pelvic CT demonstrated significant adenopathy surrounding the pancreas, and involving the retroperitoneum and mesentery throughout the abdomen and extending into the upper pelvis. This adenopathy is very homogeneous and most typical of lymphoma. He also has a peripheral blood lymphocytosis and "shotty" cervical lymphadenopathy, which patient states was first noticed in 02/2012. Have consulted Dr Denny Levy, interventional radiology for possible CT-guided lymph node biopsy, and he has recommended chest CT scan. CT scan of the chest has been done. Serum LDH is within normal limits at 244.  Peripheral blood flow cytometry pending. Interventional radiology recommended consulted surgery as per oncology recommendations. Surgery has been consulted, and awaiting recommendations for  biopsy. Per Dr Clelia Croft once pathology is done for lymph node biopsy then oncology evaluation to follow.  2. Pancreatitis: Patient was found to have a Lipase of 255 at presentation. LFTs are normal. He states that he quit drinking about 2 months ago, and imaging studies show no evidence of biliary or pancreatic ductal dilatation. Abdominal exam appears quite benign. He may have a mild pancreatitis, which is already resolving clinically. Patient  states abdominal pain has improved. Lipase levels trending down. Will restart patient on clear liquids and advance as tolerated. Decrease IV fluids to 50 cc per hour. Continue PPI IV twice a day. Continue supportive care. Following Lipase levels. 3. CAD s/p Stent to RCA with collateral status post OM1 BMS 06/06/2012. Patient appears clinically stable/asymptomatic from this view point. Dr. Herbie Baltimore of [facility] heart and vascular, consulted. Patient is on Prasugrel  and an aspirin which we continued. Per cardiology patient had a bare-metal stent placed 06/06/2012 and has had approximately a month of antiplatelet therapy and cardiology recommended discontinuing aspirin and effient to allow biopsy of the lower risk in the next 7-10 days. Discontinued effient 3 days ago. Will continue aspirin as per interventional radiology okay for patient to be on aspirin. Per cardiology. 4. CHF: Patient has known history of combined systolic/diastolic CHF. No clinical evidence of decompensation at this time. Will need to watch volume status closely. Decrease IV fluids to 50 cc per hour. 5. OSA: Stable on CPAP.  6. Hyponatremia; This is mild, and likely secondary to poor oral intake. Slowly improving with hydration. Sodium 131 low chloride 94 on admission-probably secondary to poor by mouth intake and diuretic therapy. Following lytes. 7. Diabetes mellitus:  Type 2. Hemoglobin A1c 7.8 on 06/06/2012. CBGs have ranged from 87-163. Metformin is on hold. Managing with SSI.  8.leukocytosis Likely secondary to lymphadenopathy versus pancreatitis. Leukocytosis is trending down. Urinalysis is negative for UTI. CT chest is negative for any acute infiltrate however shows bulky supraclavicular subpectoral axillary mediastinal and hilar lymphadenopathy. No need for antibiotics at this time. We'll monitor. 9.hypertension Continue Coreg. Will resume patient's home dose lisinopril.  Code Status: Full Code.  Family Communication:  Updated patient at bedside. No family present. Disposition Plan: To be determined.    Brief narrative: 66 yr old  [name] with known history of HTN, dyslipidemia, DM-2, OSA-on CPAP, OA, lower GI bleed, combined systolic/diastolic CHF, moderated aortic stenosis, CAD/ischemic cardiomyopathy, s/p hospitalization 06/06/12-06/07/12 for ACS s/p Stent to RCA presenting with abdominal pain and constipation over the past 2 weeks, despite visits to [facility]. He has been unable to keep food down. He eventually came to the ED on 07/08/12, where work up revealed a eukocytosis of 17.3, mild hyponatremia of 131.CL of 94 and Lipase was 255. He was admitted for further management.    Consultants:  Cardiology/[facility].  Jetty Peeks, MD, IR.  Dr Eli Hose, Hem-Onc.   General surgery: Meghan Dort PA  Procedures:  Abdominal X-Ray.   CT abdomen/pelvis.   CT of the abdomen and chest 07/09/2012  Antibiotics:  N/A.   HPI/Subjective: Patient states abdominal pain improved with increased IV dose of Protonix. Patient states he's required less pain medication. Patient asking diet to be advanced.  Objective: Vital signs in last 24 hours: Temp:  [98.1 F (36.7 C)] 98.1 F (36.7 C) (12/25 0545) Pulse Rate:  [72-97] 75  (12/25 0545) Resp:  [18-22] 18  (12/25 0545) BP: (119-150)/(69-86) 139/69 mmHg (12/25 0545) SpO2:  [88 %-97 %] 94 % (12/25 0545) Weight:  [119.2 kg (262 lb 12.6 oz)] 119.2 kg (262 lb 12.6 oz) (12/25 0545) Weight change: -0.731 kg (-1 lb 9.8 oz) Last BM Date: 07/08/12  Intake/Output from previous day: 12/24 0701 - 12/25 0700 In: 1231.7 [I.V.:1231.7] Out: 600 [Urine:600]     Physical Exam: General: Comfortable, alert, communicative, fully oriented, not short of breath at rest.  HEENT:  No clinical pallor, no jaundice, no conjunctival injection or discharge. Hydration is satisfactory.  NECK:  Supple, JVP not seen, no carotid bruits, shotty cervical lymphadenopathy, no palpable  goiter. CHEST:  Clinically clear to auscultation, no wheezes, no crackles. No palpable axillary lymphadenopathy. HEART:  Sounds 1 and 2 heard, normal, regular, no murmurs. ABDOMEN:  Moderately obese, soft, non-tender, no palpable organomegaly, no palpable masses, normal bowel sounds. NTTP patient is status post pain medications.. GENITALIA:  Not examined. LOWER EXTREMITIES:  No pitting edema, palpable peripheral pulses. MUSCULOSKELETAL SYSTEM:  Unremarkable.  CENTRAL NERVOUS SYSTEM:  No focal neurologic deficit on gross examination.  Lab Results:  Basename 07/11/12 0435 07/10/12 0610  WBC 14.1* 17.7*  HGB 14.3 15.0  HCT 42.0 43.4  PLT 211 230    Basename 07/11/12 0435 07/10/12 0610  NA 137 134*  K 4.2 4.6  CL 98 95*  CO2 22 25  GLUCOSE 94 139*  BUN 15 14  CREATININE 1.08 1.09  CALCIUM 9.5 9.8   No results found for this or any previous visit (from the past 240 hour(s)).   Studies/Results: Ct Chest W Contrast  07/09/2012  *RADIOLOGY REPORT*  Clinical Data: Lymphadenopathy.  CT CHEST WITH CONTRAST  Technique:  Multidetector CT imaging of the chest was performed following the standard protocol during bolus administration of intravenous contrast.  Contrast: 75mL OMNIPAQUE IOHEXOL 300 MG/ML  SOLN  Comparison: Abdominal CT scan 07/08/2012.  Findings: The chest wall demonstrates bulky supraclavicular, subpectoral and axillary lymphadenopathy.  The thyroid gland is grossly normal.  The bony thorax is intact.  No destructive bone lesions or spinal canal compromise.  There are advanced degenerative changes involving the shoulder joints and there are moderate degenerative changes involving the thoracic spine.  The heart is normal in size.  No pericardial effusion.  The aorta is normal in caliber.  Mild atherosclerotic calcifications. Coronary artery calcifications are noted.  There  is bulky mediastinal and hilar lymphadenopathy. The esophagus is grossly normal.  Examination of the lung  parenchyma demonstrates no acute pulmonary findings.  No worrisome pulmonary mass lesions or nodules.  No pleural effusion.  The upper abdomen demonstrates bulky adenopathy.  IMPRESSION:  1.  Bulky supraclavicular, subpectoral, axillary, mediastinal and hilar lymphadenopathy. 2.  No acute pulmonary findings or worrisome pulmonary nodules.   Original Report Authenticated By: Rudie Meyer, M.D.     Medications: Scheduled Meds:    . aspirin  81 mg Oral Daily  . atorvastatin  20 mg Oral q1800  . carvedilol  10 mg Oral Daily  . docusate sodium  100 mg Oral BID  . insulin aspart  0-9 Units Subcutaneous Q4H  . oxymetazoline  2 spray Each Nare BID  . pantoprazole (PROTONIX) IV  40 mg Intravenous Q12H  . sodium chloride  3 mL Intravenous Q12H   Continuous Infusions:    . sodium chloride 75 mL/hr at 07/11/12 0044   PRN Meds:.morphine injection, ondansetron (ZOFRAN) IV, polyethylene glycol, sodium phosphate    LOS: 3 days   [facility]  [facility] Pager [phone]. If 8PM-8AM, please contact night-coverage at [email or web address], password [facility] 07/11/2012, 11:27 AM  LOS: 3 days

## 2012-07-11 NOTE — Progress Notes (Signed)
Patient has had pain med x1 during 7a-7p shift and has been ambulating short distances in the hall to escalator and back to room without any pain noted. Will continue to monitor to end of shift.

## 2012-07-12 ENCOUNTER — Encounter (HOSPITAL_COMMUNITY): Payer: Self-pay | Admitting: Internal Medicine

## 2012-07-12 DIAGNOSIS — K219 Gastro-esophageal reflux disease without esophagitis: Secondary | ICD-10-CM

## 2012-07-12 HISTORY — DX: Gastro-esophageal reflux disease without esophagitis: K21.9

## 2012-07-12 LAB — CBC
Hemoglobin: 14.7 g/dL (ref 13.0–17.0)
MCH: 28.5 pg (ref 26.0–34.0)
MCV: 83.3 fL (ref 78.0–100.0)
RBC: 5.16 MIL/uL (ref 4.22–5.81)

## 2012-07-12 LAB — GLUCOSE, CAPILLARY
Glucose-Capillary: 137 mg/dL — ABNORMAL HIGH (ref 70–99)
Glucose-Capillary: 138 mg/dL — ABNORMAL HIGH (ref 70–99)

## 2012-07-12 LAB — BASIC METABOLIC PANEL
BUN: 13 mg/dL (ref 6–23)
CO2: 26 mEq/L (ref 19–32)
Calcium: 9.7 mg/dL (ref 8.4–10.5)
Glucose, Bld: 119 mg/dL — ABNORMAL HIGH (ref 70–99)
Potassium: 4.3 mEq/L (ref 3.5–5.1)
Sodium: 138 mEq/L (ref 135–145)

## 2012-07-12 MED ORDER — PANTOPRAZOLE SODIUM 40 MG PO TBEC
40.0000 mg | DELAYED_RELEASE_TABLET | Freq: Two times a day (BID) | ORAL | Status: DC
  Administered 2012-07-12 – 2012-07-14 (×4): 40 mg via ORAL
  Filled 2012-07-12 (×4): qty 1

## 2012-07-12 MED ORDER — GI COCKTAIL ~~LOC~~
30.0000 mL | Freq: Three times a day (TID) | ORAL | Status: DC | PRN
Start: 2012-07-12 — End: 2012-07-14
  Administered 2012-07-12 – 2012-07-14 (×2): 30 mL via ORAL
  Filled 2012-07-12 (×4): qty 30

## 2012-07-12 NOTE — Consult Note (Signed)
Agree with A&P of MD,PA.

## 2012-07-12 NOTE — Progress Notes (Signed)
Pancreatitis  Assessment: Pancreatitis Diffuse lymphadenopathy, likely malignant  Plan: He has some readily accessible posterior cervical nodes. He will be off anticoagulant five days tomorrow. This node is fairly superficial and we can remove under IV sedation and local. I think if we do tomorrow or Saturday there will be lonely a slight increase in bleeding risk. I discussed with patient and he is agreeable.  If primary care and card ok we will schedule for tomorrow   Subjective: Still with some abd pain  Objective: Vital signs in last 24 hours: Temp:  [97.5 F (36.4 C)-98.2 F (36.8 C)] 97.8 F (36.6 C) (12/26 0609) Pulse Rate:  [78-86] 82  (12/26 0609) Resp:  [19-20] 20  (12/26 0609) BP: (136-152)/(70-93) 136/91 mmHg (12/26 0609) SpO2:  [97 %-100 %] 97 % (12/26 0609) Weight:  [263 lb 0.1 oz (119.3 kg)] 263 lb 0.1 oz (119.3 kg) (12/26 0609) Last BM Date: 07/08/12  Intake/Output from previous day: 12/25 0701 - 12/26 0700 In: 360 [P.O.:360] Out: 2851 [Urine:2850; Stool:1] Intake/Output this shift:    General appearance: alert, cooperative and no distress Lymph nodes: Cervical adenopathy: Bilateral and there are nodes that are somewhat posterior and superficial that are amenable to excisional biopsy  Lab Results:  Results for orders placed during the hospital encounter of 07/08/12 (from the past 24 hour(s))  GLUCOSE, CAPILLARY     Status: Normal   Collection Time   07/11/12 12:02 PM      Component Value Range   Glucose-Capillary 87  70 - 99 mg/dL  GLUCOSE, CAPILLARY     Status: Abnormal   Collection Time   07/11/12  4:15 PM      Component Value Range   Glucose-Capillary 163 (*) 70 - 99 mg/dL   Comment 1 Notify RN    GLUCOSE, CAPILLARY     Status: Abnormal   Collection Time   07/11/12  5:02 PM      Component Value Range   Glucose-Capillary 160 (*) 70 - 99 mg/dL  GLUCOSE, CAPILLARY     Status: Abnormal   Collection Time   07/11/12  8:56 PM      Component Value  Range   Glucose-Capillary 130 (*) 70 - 99 mg/dL  BASIC METABOLIC PANEL     Status: Abnormal   Collection Time   07/12/12  4:05 AM      Component Value Range   Sodium 138  135 - 145 mEq/L   Potassium 4.3  3.5 - 5.1 mEq/L   Chloride 100  96 - 112 mEq/L   CO2 26  19 - 32 mEq/L   Glucose, Bld 119 (*) 70 - 99 mg/dL   BUN 13  6 - 23 mg/dL   Creatinine, Ser 6.29  0.50 - 1.35 mg/dL   Calcium 9.7  8.4 - 52.8 mg/dL   GFR calc non Af Amer 67 (*) >90 mL/min   GFR calc Af Amer 78 (*) >90 mL/min  CBC     Status: Abnormal   Collection Time   07/12/12  4:05 AM      Component Value Range   WBC 14.1 (*) 4.0 - 10.5 K/uL   RBC 5.16  4.22 - 5.81 MIL/uL   Hemoglobin 14.7  13.0 - 17.0 g/dL   HCT 41.3  24.4 - 01.0 %   MCV 83.3  78.0 - 100.0 fL   MCH 28.5  26.0 - 34.0 pg   MCHC 34.2  30.0 - 36.0 g/dL   RDW 27.2  53.6 -  15.5 %   Platelets 228  150 - 400 K/uL  LIPASE, BLOOD     Status: Abnormal   Collection Time   07/12/12  4:05 AM      Component Value Range   Lipase 61 (*) 11 - 59 U/L  GLUCOSE, CAPILLARY     Status: Abnormal   Collection Time   07/12/12  5:21 AM      Component Value Range   Glucose-Capillary 137 (*) 70 - 99 mg/dL   Comment 1 Documented in Chart     Comment 2 Notify RN       Studies/Results Radiology     MEDS, Scheduled    . aspirin  81 mg Oral Daily  . atorvastatin  20 mg Oral q1800  . carvedilol  10 mg Oral Daily  . docusate sodium  100 mg Oral BID  . insulin aspart  0-9 Units Subcutaneous Q4H  . lisinopril  5 mg Oral Daily  . oxymetazoline  2 spray Each Nare BID  . pantoprazole (PROTONIX) IV  40 mg Intravenous Q12H  . sodium chloride  3 mL Intravenous Q12H       LOS: 4 days    Currie Paris, MD, [facility], [place] [phone]   07/12/2012 8:14 AM

## 2012-07-12 NOTE — Evaluation (Signed)
Occupational Therapy Evaluation Patient Details Name: [name] [name] MRN: [id number] DOB: 1945/11/20 Today's Date: 07/12/2012 Time: 6962-9528 OT Time Calculation (min): 11 min  OT Assessment / Plan / Recommendation Clinical Impression  Pt admitted with abdominal pain and undergoing work up for pancreatitis.  Pt currently at mod I/I level with ADLs and functional mobility.  Bil shoulder deficits at baseline which could be addressed by OPOT.  No further acute OT needs. Signing off.    OT Assessment  All further OT needs can be met in the next venue of care    Follow Up Recommendations  Outpatient OT (for bil shoulders)    Barriers to Discharge      Equipment Recommendations  None recommended by OT    Recommendations for Other Services    Frequency       Precautions / Restrictions Precautions Precautions: None   Pertinent Vitals/Pain See vitals    ADL  Grooming: Performed;Wash/dry face;Modified independent Where Assessed - Grooming: Unsupported sitting Lower Body Dressing: Performed;Modified independent Where Assessed - Lower Body Dressing: Unsupported sitting Toilet Transfer: Simulated;Independent Toilet Transfer Method: Sit to Barista: Other (comment) (ambulating around room) Toileting - Clothing Manipulation and Hygiene: Performed;Independent Where Assessed - Toileting Clothing Manipulation and Hygiene: Standing Equipment Used:  (none) Transfers/Ambulation Related to ADLs: independent without device around room ADL Comments: Pt is at baseline level I/mod I with ADLs.  Mod I due to bil shoulder ROM deficits (baseline- frozen shoulder) but is able to perform all tasks with increased time.  Pt reports that he would like to paricipate in cardiac rehab but is unable to get his truck out of the garage. Has been looking in to using [facility] transportation but cannot not access a phone number in order to contact them.  OT provided pt with phone numbers for  both [employer] and [employer] transportation for pt to contact in order to maximize safety and independence with community mobility.    OT Diagnosis:    OT Problem List:   OT Treatment Interventions:     OT Goals    Visit Information  Last OT Received On: 07/12/12    Subjective Data      Prior Functioning     Home Living Lives With: Alone Available Help at Discharge: Family;Available PRN/intermittently Type of Home: House Home Access: Level entry Home Layout: One level Bathroom Shower/Tub: Engineer, manufacturing systems: Standard Home Adaptive Equipment: None Prior Function Level of Independence: Independent Able to Take Stairs?: Yes Driving: Yes         Vision/Perception     Cognition  Overall Cognitive Status: Appears within functional limits for tasks assessed/performed Arousal/Alertness: Awake/alert Orientation Level: Appears intact for tasks assessed Behavior During Session: [name] for tasks performed    Extremity/Trunk Assessment Right Upper Extremity Assessment RUE ROM/Strength/Tone: [name] for tasks assessed;Deficits RUE ROM/Strength/Tone Deficits: 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  FF ~100.  Able to reach back of head when washing face/head.  Pt shaves his head and was able to reach all aspects of head with bil UE.  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  FF ~100.  Able to reach back of head when washing face/head.  Pt shaves his head and was able to reach all aspects of head with bil UE.     Mobility Bed Mobility Bed Mobility: Not assessed Transfers Transfers: Sit to Stand;Stand to Sit Sit  to Stand: 7: Independent;From bed Stand to Sit: 7: Independent;To bed     Shoulder Instructions     Exercise     Balance     End of Session OT - End of Session Equipment Utilized During Treatment:  (none) Activity  Tolerance: Patient tolerated treatment well Patient left: with nursing in room Nurse Communication: Mobility status  GO    07/12/2012 [name] OTR/L Pager [phone] Office [phone]  [name] 07/12/2012, 11:53 AM

## 2012-07-12 NOTE — Progress Notes (Deleted)
PT does not wear a CPAP 

## 2012-07-12 NOTE — Progress Notes (Signed)
[facility] PROGRESS NOTE  [name] [name] AVW:[id number] DOB: [birth year] DOA: 07/08/2012 PCP: Default, Provider, MD  Assessment/Plan: Principal Problem:  *Pancreatitis Active Problems:  CAD, CTO RCA with colloat. S/P OM1 BMS 06/06/12  Ischemic cardiomyopathy, EF40-45% 2D 10/13  HTN (hypertension)  Diabetes mellitus, Type 2 NIDDM  Aortic stenosis, moderate at cath 06/06/12  Dyslipidemia, LDL 105  Leukocytosis  Smoker  Primary pancreatic adenocarcinoma??  Lymphadenopathy  Chronic combined systolic and diastolic CHF (congestive heart failure)  GERD (gastroesophageal reflux disease)   1. Abdominal pain: Patient presented with a 2-week history of abdominal pain, constipation, poor appetite and occasional vomiting. Abdomina/pelvic CT demonstrated significant adenopathy surrounding the pancreas, and involving the retroperitoneum and mesentery throughout the abdomen and extending into the upper pelvis. This adenopathy is very homogeneous and most typical of lymphoma. He also has a peripheral blood lymphocytosis and "shotty" cervical lymphadenopathy, which patient states was first noticed in 02/2012. Have consulted Dr Denny Levy, interventional radiology for possible CT-guided lymph node biopsy, and he has recommended chest CT scan. CT scan of the chest has been done. Serum LDH is within normal limits at 244.  Peripheral blood flow cytometry pending. Interventional radiology recommended consulted surgery as per oncology recommendations. Surgery has been consulted and scheduling patient for biopsy tomorrow. It's okay with medicine for biopsy to be done tomorrow. Patient was seen by cardiology on 07/09/2012 and per note okay for biopsy tomorrow. Per Dr Clelia Croft once pathology is done for lymph node biopsy then oncology evaluation to follow.  2. Pancreatitis: Patient was found to have a Lipase of 255 at presentation. LFTs are normal. He states that he quit drinking about 2 months ago, and imaging studies  show no evidence of biliary or pancreatic ductal dilatation. Abdominal exam appears quite benign. He may have a mild pancreatitis, which is already resolving clinically. Patient states abdominal pain has improved. Lipase levels trending down. Patient tolerated clear and full liquids yesterday. Will normal saline lock IV fluids. Will advance diet to her heart healthy diet. Continue PPI twice daily. 3. Gastroesophageal reflux disease Change IV PPI to oral PPI twice daily. GI cocktail when necessary. 4. CAD s/p Stent to RCA with collateral status post OM1 BMS 06/06/2012. Patient appears clinically stable/asymptomatic from this view point. Dr. Herbie Baltimore of [facility] heart and vascular, consulted. Patient is on Prasugrel  and an aspirin which we continued. Per cardiology patient had a bare-metal stent placed 06/06/2012 and has had approximately a month of antiplatelet therapy and cardiology recommended discontinuing aspirin and effient to allow biopsy of the lower risk in the next 7-10 days. Discontinued effient 4 days ago. Will continue aspirin as per interventional radiology okay for patient to be on aspirin. Per cardiology. 5. CHF: Patient has known history of combined systolic/diastolic CHF. No clinical evidence of decompensation at this time. Normal saline lock IV fluids. Will resume Lasix at 1-2 days. 6. OSA: Stable on CPAP.  7. Hyponatremia; This is mild, and likely secondary to poor oral intake. Slowly improving with hydration. Sodium 131 low chloride 94 on admission-probably secondary to poor by mouth intake and diuretic therapy. Resolved. Following lytes. 8. Diabetes mellitus:  Type 2. Hemoglobin A1c 7.8 on 06/06/2012. CBGs have ranged from 130-160. Metformin is on hold. Managing with SSI.  9.leukocytosis Likely secondary to lymphadenopathy versus pancreatitis. Leukocytosis is trending down. Urinalysis is negative for UTI. CT chest is negative for any acute infiltrate however shows bulky  supraclavicular subpectoral axillary mediastinal and hilar lymphadenopathy. No need for antibiotics at this time. Will monitor.  10.hypertension Continue Coreg and lisinopril.   Code Status: Full Code.  Family Communication: Updated patient at bedside. No family present. Disposition Plan: To be determined.    Brief narrative: 66 yr old [name] with known history of HTN, dyslipidemia, DM-2, OSA-on CPAP, OA, lower GI bleed, combined systolic/diastolic CHF, moderated aortic stenosis, CAD/ischemic cardiomyopathy, s/p hospitalization 06/06/12-06/07/12 for ACS s/p Stent to RCA presenting with abdominal pain and constipation over the past 2 weeks, despite visits to [facility]. He has been unable to keep food down. He eventually came to the ED on 07/08/12, where work up revealed a eukocytosis of 17.3, mild hyponatremia of 131.CL of 94 and Lipase was 255. He was admitted for further management.    Consultants:  Cardiology/[facility].  Jetty Peeks, MD, IR.  Dr Eli Hose, Hem-Onc.   General surgery: Meghan Dort PA/Dr. Jamey Ripa  Procedures:  Abdominal X-Ray.   CT abdomen/pelvis.   CT of the abdomen and chest 07/09/2012  Antibiotics:  N/A.   HPI/Subjective: Patient states abdominal pain improved with increased IV dose of Protonix. Patient states he's required less pain medication. Patient describes his pain as more burning sensation. No pain with full liquid diet last night.   Objective: Vital signs in last 24 hours: Temp:  [97.5 F (36.4 C)-98.2 F (36.8 C)] 97.8 F (36.6 C) (12/26 0609) Pulse Rate:  [78-86] 82  (12/26 0609) Resp:  [19-20] 20  (12/26 0609) BP: (136-152)/(70-93) 136/91 mmHg (12/26 0609) SpO2:  [97 %-100 %] 97 % (12/26 0609) Weight:  [119.3 kg (263 lb 0.1 oz)] 119.3 kg (263 lb 0.1 oz) (12/26 0609) Weight change: 0.1 kg (3.5 oz) Last BM Date: 07/08/12  Intake/Output from previous day: 12/25 0701 - 12/26 0700 In: 360 [P.O.:360] Out: 2851 [Urine:2850; Stool:1] Total I/O In:  360 [P.O.:360] Out: -    Physical Exam: General: Comfortable, alert, communicative, fully oriented, not short of breath at rest.  HEENT:  No clinical pallor, no jaundice, no conjunctival injection or discharge. Hydration is satisfactory.  NECK:  Supple, JVP not seen, no carotid bruits, shotty cervical lymphadenopathy, no palpable goiter. CHEST:  Clinically clear to auscultation, no wheezes, no crackles. No palpable axillary lymphadenopathy. HEART:  Sounds 1 and 2 heard, normal, regular, no murmurs. ABDOMEN:  Moderately obese, soft, non-tender, no palpable organomegaly, no palpable masses, normal bowel sounds. NTTP patient is status post pain medications.. GENITALIA:  Not examined. LOWER EXTREMITIES:  No pitting edema, palpable peripheral pulses. MUSCULOSKELETAL SYSTEM:  Unremarkable.  CENTRAL NERVOUS SYSTEM:  No focal neurologic deficit on gross examination.  Lab Results:  [facility] 07/12/12 0405 07/11/12 0435  WBC 14.1* 14.1*  HGB 14.7 14.3  HCT 43.0 42.0  PLT 228 211    Basename 07/12/12 0405 07/11/12 0435  NA 138 137  K 4.3 4.2  CL 100 98  CO2 26 22  GLUCOSE 119* 94  BUN 13 15  CREATININE 1.12 1.08  CALCIUM 9.7 9.5   No results found for this or any previous visit (from the past 240 hour(s)).   Studies/Results: No results found.  Medications: Scheduled Meds:    . aspirin  81 mg Oral Daily  . atorvastatin  20 mg Oral q1800  . carvedilol  10 mg Oral Daily  . docusate sodium  100 mg Oral BID  . insulin aspart  0-9 Units Subcutaneous Q4H  . lisinopril  5 mg Oral Daily  . oxymetazoline  2 spray Each Nare BID  . pantoprazole  40 mg Oral BID AC  . sodium chloride  3  mL Intravenous Q12H   Continuous Infusions:   PRN Meds:.gi cocktail, morphine injection, ondansetron (ZOFRAN) IV, polyethylene glycol, simethicone, sodium phosphate    LOS: 4 days   [facility]  [facility] Pager [phone]. If 8PM-8AM, please contact night-coverage at [email or web address],  password [facility] 07/12/2012, 9:34 AM  LOS: 4 days

## 2012-07-12 NOTE — Progress Notes (Signed)
Upon entering the room, the pt. Is on CPAP (IPAP 10, EPAP 10) back up rate of 8.  Pt. Is resting and tolerating well. RN aware.

## 2012-07-12 NOTE — Progress Notes (Signed)
AM labs drawn. Pt OOB to chair, tolerating activity well.We will continue to monitor.

## 2012-07-12 NOTE — Progress Notes (Signed)
The pt is for biopsy tomorrow. Dr Janee Morn has asked [place] to stop by and check on the pt post op and to make final recommendations reguarding Effient.  Corine Shelter PA-C 07/12/2012 4:05 PM

## 2012-07-13 ENCOUNTER — Encounter (HOSPITAL_COMMUNITY): Payer: Self-pay | Admitting: *Deleted

## 2012-07-13 ENCOUNTER — Inpatient Hospital Stay (HOSPITAL_COMMUNITY): Payer: Medicare Other | Admitting: *Deleted

## 2012-07-13 ENCOUNTER — Encounter (HOSPITAL_COMMUNITY)
Admission: EM | Disposition: A | Discharge status: Home / Self Care | Payer: Self-pay | Source: Home / Self Care | Attending: Internal Medicine

## 2012-07-13 DIAGNOSIS — C9110 Chronic lymphocytic leukemia of B-cell type not having achieved remission: Secondary | ICD-10-CM

## 2012-07-13 DIAGNOSIS — C8581 Other specified types of non-Hodgkin lymphoma, lymph nodes of head, face, and neck: Secondary | ICD-10-CM

## 2012-07-13 HISTORY — PX: LYMPH GLAND EXCISION: SHX13

## 2012-07-13 LAB — GLUCOSE, CAPILLARY
Glucose-Capillary: 191 mg/dL — ABNORMAL HIGH (ref 70–99)
Glucose-Capillary: 123 mg/dL — ABNORMAL HIGH (ref 70–99)
Glucose-Capillary: 123 mg/dL — ABNORMAL HIGH (ref 70–99)

## 2012-07-13 LAB — BASIC METABOLIC PANEL
BUN: 13 mg/dL (ref 6–23)
CO2: 24 mEq/L (ref 19–32)
Calcium: 9.3 mg/dL (ref 8.4–10.5)
Chloride: 102 mEq/L (ref 96–112)
Creatinine, Ser: 1.11 mg/dL (ref 0.50–1.35)

## 2012-07-13 LAB — CBC
HCT: 39.4 % (ref 39.0–52.0)
MCH: 28.7 pg (ref 26.0–34.0)
MCV: 83.1 fL (ref 78.0–100.0)
Platelets: 217 10*3/uL (ref 150–400)
RBC: 4.74 MIL/uL (ref 4.22–5.81)

## 2012-07-13 LAB — SURGICAL PCR SCREEN: MRSA, PCR: NEGATIVE

## 2012-07-13 SURGERY — EXCISION, LYMPH NODE, CERVICAL
Anesthesia: Monitor Anesthesia Care | Site: Neck | Laterality: Right | Wound class: Clean

## 2012-07-13 MED ORDER — MEPERIDINE HCL 25 MG/ML IJ SOLN: 6.2500 mg | INTRAMUSCULAR | Status: DC | PRN

## 2012-07-13 MED ORDER — HEMOSTATIC AGENTS (NO CHARGE) OPTIME
TOPICAL | Status: DC | PRN
  Administered 2012-07-13: 1 via TOPICAL

## 2012-07-13 MED ORDER — OXYCODONE HCL 5 MG PO TABS: 5.0000 mg | ORAL_TABLET | Freq: Once | ORAL | Status: DC | PRN

## 2012-07-13 MED ORDER — LACTATED RINGERS IV SOLN
INTRAVENOUS | Status: DC | PRN
  Administered 2012-07-13: 10:00:00 via INTRAVENOUS

## 2012-07-13 MED ORDER — LIDOCAINE HCL (PF) 1 % IJ SOLN
INTRAMUSCULAR | Status: AC
  Filled 2012-07-13: qty 30

## 2012-07-13 MED ORDER — MIDAZOLAM HCL 5 MG/5ML IJ SOLN
INTRAMUSCULAR | Status: DC | PRN
  Administered 2012-07-13: 2 mg via INTRAVENOUS

## 2012-07-13 MED ORDER — MIDAZOLAM HCL 2 MG/2ML IJ SOLN: 0.5000 mg | Freq: Once | INTRAMUSCULAR | Status: DC | PRN

## 2012-07-13 MED ORDER — LIDOCAINE HCL 1 % IJ SOLN
INTRAMUSCULAR | Status: DC | PRN
  Administered 2012-07-13: 11:00:00 via INTRAMUSCULAR

## 2012-07-13 MED ORDER — PROPOFOL INFUSION 10 MG/ML OPTIME
INTRAVENOUS | Status: DC | PRN
  Administered 2012-07-13: 75 ug/kg/min via INTRAVENOUS

## 2012-07-13 MED ORDER — INFLUENZA VIRUS VACC SPLIT PF IM SUSP
0.5000 mL | INTRAMUSCULAR | Status: AC
  Administered 2012-07-14: 0.5 mL via INTRAMUSCULAR
  Filled 2012-07-13: qty 0.5

## 2012-07-13 MED ORDER — FENTANYL CITRATE 0.05 MG/ML IJ SOLN
INTRAMUSCULAR | Status: DC | PRN
  Administered 2012-07-13 (×3): 25 ug via INTRAVENOUS

## 2012-07-13 MED ORDER — PROMETHAZINE HCL 25 MG/ML IJ SOLN: 6.2500 mg | INTRAMUSCULAR | Status: DC | PRN

## 2012-07-13 MED ORDER — OXYCODONE HCL 5 MG/5ML PO SOLN: 5.0000 mg | Freq: Once | ORAL | Status: DC | PRN

## 2012-07-13 MED ORDER — LACTATED RINGERS IV SOLN
INTRAVENOUS | Status: DC
  Administered 2012-07-13: 10:00:00 via INTRAVENOUS

## 2012-07-13 MED ORDER — HYDROMORPHONE HCL PF 1 MG/ML IJ SOLN: 0.2500 mg | INTRAMUSCULAR | Status: DC | PRN

## 2012-07-13 SURGICAL SUPPLY — 43 items
ADH SKN CLS APL DERMABOND .7 (GAUZE/BANDAGES/DRESSINGS) ×2
BLADE SURG 10 STRL SS (BLADE) ×2 IMPLANT
BLADE SURG 15 STRL LF DISP TIS (BLADE) ×1 IMPLANT
BLADE SURG 15 STRL SS (BLADE) ×2
CANISTER SUCTION 2500CC (MISCELLANEOUS) IMPLANT
CHLORAPREP W/TINT 26ML (MISCELLANEOUS) ×2 IMPLANT
CLOTH BEACON ORANGE TIMEOUT ST (SAFETY) ×2 IMPLANT
COVER SURGICAL LIGHT HANDLE (MISCELLANEOUS) ×2 IMPLANT
DECANTER SPIKE VIAL GLASS SM (MISCELLANEOUS) ×2 IMPLANT
DERMABOND ADVANCED (GAUZE/BANDAGES/DRESSINGS) ×2
DERMABOND ADVANCED .7 DNX12 (GAUZE/BANDAGES/DRESSINGS) ×2 IMPLANT
DRAPE LAPAROTOMY T 102X78X121 (DRAPES) IMPLANT
DRAPE PED LAPAROTOMY (DRAPES) ×2 IMPLANT
DRAPE UTILITY 15X26 W/TAPE STR (DRAPE) ×4 IMPLANT
ELECT CAUTERY BLADE 6.4 (BLADE) ×2 IMPLANT
ELECT REM PT RETURN 9FT ADLT (ELECTROSURGICAL) ×2
ELECTRODE REM PT RTRN 9FT ADLT (ELECTROSURGICAL) ×1 IMPLANT
GAUZE SPONGE 4X4 16PLY XRAY LF (GAUZE/BANDAGES/DRESSINGS) ×2 IMPLANT
GLOVE EUDERMIC 7 POWDERFREE (GLOVE) ×2 IMPLANT
GOWN PREVENTION PLUS XLARGE (GOWN DISPOSABLE) ×2 IMPLANT
GOWN STRL NON-REIN LRG LVL3 (GOWN DISPOSABLE) ×2 IMPLANT
HEMOSTAT SURGICEL 2X14 (HEMOSTASIS) ×2 IMPLANT
KIT BASIN OR (CUSTOM PROCEDURE TRAY) ×2 IMPLANT
KIT ROOM TURNOVER OR (KITS) ×2 IMPLANT
NEEDLE 27GAX1X1/2 (NEEDLE) ×2 IMPLANT
NS IRRIG 1000ML POUR BTL (IV SOLUTION) ×2 IMPLANT
PACK SURGICAL SETUP 50X90 (CUSTOM PROCEDURE TRAY) ×2 IMPLANT
PAD ARMBOARD 7.5X6 YLW CONV (MISCELLANEOUS) ×4 IMPLANT
PENCIL BUTTON HOLSTER BLD 10FT (ELECTRODE) ×2 IMPLANT
SPECIMEN JAR SMALL (MISCELLANEOUS) ×2 IMPLANT
SPONGE GAUZE 4X4 12PLY (GAUZE/BANDAGES/DRESSINGS) IMPLANT
SPONGE LAP 18X18 X RAY DECT (DISPOSABLE) ×2 IMPLANT
STRIP CLOSURE SKIN 1/2X4 (GAUZE/BANDAGES/DRESSINGS) IMPLANT
SUT MNCRL AB 4-0 PS2 18 (SUTURE) ×2 IMPLANT
SUT VIC AB 3-0 SH 27 (SUTURE)
SUT VIC AB 3-0 SH 27X BRD (SUTURE) IMPLANT
SYR BULB 3OZ (MISCELLANEOUS) ×2 IMPLANT
SYR CONTROL 10ML LL (SYRINGE) ×2 IMPLANT
TOWEL OR 17X24 6PK STRL BLUE (TOWEL DISPOSABLE) ×2 IMPLANT
TOWEL OR 17X26 10 PK STRL BLUE (TOWEL DISPOSABLE) ×2 IMPLANT
TUBE CONNECTING 12X1/4 (SUCTIONS) IMPLANT
WATER STERILE IRR 1000ML POUR (IV SOLUTION) IMPLANT
YANKAUER SUCT BULB TIP NO VENT (SUCTIONS) IMPLANT

## 2012-07-13 NOTE — Op Note (Signed)
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 Lymph node biopsy was recommended and requested by the medical oncologist for diagnostic purposes. This was discussed with the patient and he is agreeable. There is a fairly abnormal right posterior cervical lymph node that was thought to be amenable to excisional biopsy    Description of Procedure: the patient was seen again in the preoperative area and the plans confirmed and the lymph node marked. He was taken the operating room and given IV sedation. The right posterior neck was prepped and draped and a timeout performed.  Accommodation of 1% plain Xylocaine and 0.25% Marcaine with epinephrine was mixed equally and used for local. Was infiltrated over the palpable lymph node. A curvilinear incision was made in a skin fold directly over the lymph node. The platysma was divided. A self-retaining retractor was placed. The abnormal lymph node was carefully dissected out with accommodation of blunt dissection and cautery. Cautery used to strip Limited because of proximity to multiple dermal nerves.  On this was removed it was sent for pathology. Because the patient has been on continuous aspirin as well as other anticoagulant therapy I left a small piece of Surgicel in the wound prior to closing. However, there have been no significant bleeding at all. Incision was closed in layers with 3-0 Vicryl and 4 Monocryl subcuticular plus Dermabond. The patient tolerated the procedure well. Counts were correct. Estimated blood loss was minimal.  Currie Paris, MD, FACS 07/13/2012 11:18 AM

## 2012-07-13 NOTE — Transfer of Care (Signed)
Immediate Anesthesia Transfer of Care Note  Patient: [name] [name]  Procedure(s) Performed: Procedure(s) (LRB) with comments: CERVICAL LYMPH GLAND EXCISION (Right)  Patient Location: PACU  Anesthesia Type:MAC  Level of Consciousness: awake, oriented and patient cooperative  Airway & Oxygen Therapy: Patient Spontanous Breathing and Patient connected to nasal cannula oxygen  Post-op Assessment: Report given to PACU RN and Post -op Vital signs reviewed and stable  Post vital signs: Reviewed and stable  Complications: No apparent anesthesia complications

## 2012-07-13 NOTE — Preoperative (Signed)
Beta Blockers   Reason not to administer Beta Blockers:Not Applicable, took this am 

## 2012-07-13 NOTE — Anesthesia Postprocedure Evaluation (Signed)
  Anesthesia Post-op Note  Patient: [name] [name]  Procedure(s) Performed: Procedure(s) (LRB) with comments: CERVICAL LYMPH GLAND EXCISION (Right)  Patient Location: PACU  Anesthesia Type:MAC  Level of Consciousness: awake, alert , oriented and patient cooperative  Airway and Oxygen Therapy: Patient Spontanous Breathing  Post-op Pain: none  Post-op Assessment: Post-op Vital signs reviewed, Patient's Cardiovascular Status Stable, Respiratory Function Stable, Patent Airway, No signs of Nausea or vomiting and Pain level controlled  Post-op Vital Signs: Reviewed and stable  Complications: No apparent anesthesia complications

## 2012-07-13 NOTE — Progress Notes (Signed)
[facility] PROGRESS NOTE  [name] [name] ZOX:[id number] DOB: Aug 04, 1945 DOA: 07/08/2012 PCP: Default, Provider, MD  Assessment/Plan: Principal Problem:  *Pancreatitis Active Problems:  CAD, CTO RCA with colloat. S/P OM1 BMS 06/06/12  Ischemic cardiomyopathy, EF40-45% 2D 10/13  HTN (hypertension)  Diabetes mellitus, Type 2 NIDDM  Aortic stenosis, moderate at cath 06/06/12  Dyslipidemia, LDL 105  Leukocytosis  Smoker  Primary pancreatic adenocarcinoma??  Lymphadenopathy  Chronic combined systolic and diastolic CHF (congestive heart failure)  GERD (gastroesophageal reflux disease)   1. Abdominal pain: Patient presented with a 2-week history of abdominal pain, constipation, poor appetite and occasional vomiting. Abdomina/pelvic CT demonstrated significant adenopathy surrounding the pancreas, and involving the retroperitoneum and mesentery throughout the abdomen and extending into the upper pelvis. This adenopathy is very homogeneous and most typical of lymphoma. He also has a peripheral blood lymphocytosis and "shotty" cervical lymphadenopathy, which patient states was first noticed in 02/2012. Have consulted Dr Denny Levy, interventional radiology for possible CT-guided lymph node biopsy, and he has recommended chest CT scan. CT scan of the chest has been done. Serum LDH is within normal limits at 244.  Peripheral blood flow cytometry pending. Interventional radiology recommended consulted surgery as per oncology recommendations. Surgery has been consulted and scheduling patient for biopsy today. Per Dr Clelia Croft once pathology is done for lymph node biopsy then oncology evaluation to follow.  2. Pancreatitis: Patient was found to have a Lipase of 255 at presentation. LFTs are normal. He states that he quit drinking about 2 months ago, and imaging studies show no evidence of biliary or pancreatic ductal dilatation. Abdominal exam appears quite benign. He may have a mild pancreatitis, which is  already resolving clinically. Patient states abdominal pain has improved. Lipase levels trending down. Patient tolerating diet. Continue PPI twice daily. 3. Gastroesophageal reflux disease Continue PPI twice daily. GI cocktail when necessary. 4. CAD s/p Stent to RCA with collateral status post OM1 BMS 06/06/2012. Patient appears clinically stable/asymptomatic from this view point. Dr. Herbie Baltimore of [facility] heart and vascular, consulted. Patient is on Prasugrel  and an aspirin which we continued. Per cardiology patient had a bare-metal stent placed 06/06/2012 and has had approximately a month of antiplatelet therapy and cardiology recommended discontinuing aspirin and effient to allow biopsy of the lower risk in the next 7-10 days. Discontinued effient 5 days ago. The patient for biopsy today per general surgery. Cardiology and general surgery to advise when to resume Effient. 5. CHF: Patient has known history of combined systolic/diastolic CHF. No clinical evidence of decompensation at this time. Normal saline lock IV fluids. Will resume Lasix tomorrow. 6. OSA: Stable on CPAP.  7. Hyponatremia; This is mild, and likely secondary to poor oral intake. Resolved. Following lytes. 8. Diabetes mellitus:  Type 2. Hemoglobin A1c 7.8 on 06/06/2012. CBGs have ranged from 123-255. Metformin is on hold. Managing with SSI.  9.leukocytosis Likely secondary to lymphadenopathy versus pancreatitis. Leukocytosis is trending down. Urinalysis is negative for UTI. CT chest is negative for any acute infiltrate however shows bulky supraclavicular subpectoral axillary mediastinal and hilar lymphadenopathy. No need for antibiotics at this time. Will monitor. 10.hypertension Continue Coreg and lisinopril.   Code Status: Full Code.  Family Communication: Updated patient at bedside. No family present. Disposition Plan: To be determined.    Brief narrative: 66 yr old [name] with known history of HTN, dyslipidemia, DM-2, OSA-on  CPAP, OA, lower GI bleed, combined systolic/diastolic CHF, moderated aortic stenosis, CAD/ischemic cardiomyopathy, s/p hospitalization 06/06/12-06/07/12 for ACS s/p Stent to RCA presenting  with abdominal pain and constipation over the past 2 weeks, despite visits to [facility]. He has been unable to keep food down. He eventually came to the ED on 07/08/12, where work up revealed a eukocytosis of 17.3, mild hyponatremia of 131.CL of 94 and Lipase was 255. He was admitted for further management.    Consultants:  Cardiology/[facility].  Jetty Peeks, MD, IR.  Dr Eli Hose, Hem-Onc.   General surgery: Meghan Dort PA/Dr. Jamey Ripa  Procedures:  Abdominal X-Ray.   CT abdomen/pelvis.   CT of the abdomen and chest 07/09/2012  Antibiotics:  N/A.   HPI/Subjective: Patient states abdominal pain improved. Patient tolerating current diet. Patient for biopsy today.  Objective: Vital signs in last 24 hours: Temp:  [97.9 F (36.6 C)-98.1 F (36.7 C)] 98 F (36.7 C) (12/27 0420) Pulse Rate:  [81-85] 81  (12/27 0420) Resp:  [18-19] 18  (12/27 0420) BP: (115-150)/(72-85) 133/85 mmHg (12/27 0420) SpO2:  [94 %-98 %] 94 % (12/27 0420) Weight:  [119.523 kg (263 lb 8 oz)] 119.523 kg (263 lb 8 oz) (12/27 0420) Weight change: 0.223 kg (7.9 oz) Last BM Date: 07/12/12  Intake/Output from previous day: 12/26 0701 - 12/27 0700 In: 1086 [P.O.:1080; I.V.:6] Out: 1125 [Urine:1125]     Physical Exam: General: Comfortable, alert, communicative, fully oriented, not short of breath at rest.  HEENT:  No clinical pallor, no jaundice, no conjunctival injection or discharge. Hydration is satisfactory.  NECK:  Supple, JVP not seen, no carotid bruits, shotty cervical lymphadenopathy, no palpable goiter. CHEST:  Clinically clear to auscultation, no wheezes, no crackles. No palpable axillary lymphadenopathy. HEART:  Sounds 1 and 2 heard, normal, regular, 3/6 SEM ABDOMEN:  Moderately obese, soft, non-tender, no  palpable organomegaly, no palpable masses, normal bowel sounds. NTTP patient is status post pain medications.. GENITALIA:  Not examined. LOWER EXTREMITIES:  No pitting edema, palpable peripheral pulses. MUSCULOSKELETAL SYSTEM:  Unremarkable.  CENTRAL NERVOUS SYSTEM:  No focal neurologic deficit on gross examination.  Lab Results:  Basename 07/13/12 0415 07/12/12 0405  WBC 13.9* 14.1*  HGB 13.6 14.7  HCT 39.4 43.0  PLT 217 228    Basename 07/13/12 0415 07/12/12 0405  NA 137 138  K 4.2 4.3  CL 102 100  CO2 24 26  GLUCOSE 115* 119*  BUN 13 13  CREATININE 1.11 1.12  CALCIUM 9.3 9.7   Recent Results (from the past 240 hour(s))  SURGICAL PCR SCREEN     Status: Normal   Collection Time   07/13/12  2:58 AM      Component Value Range Status Comment   MRSA, PCR NEGATIVE  NEGATIVE Final    Staphylococcus aureus NEGATIVE  NEGATIVE Final      Studies/Results: No results found.  Medications: Scheduled Meds:    . aspirin  81 mg Oral Daily  . atorvastatin  20 mg Oral q1800  . carvedilol  10 mg Oral Daily  . docusate sodium  100 mg Oral BID  . influenza  inactive virus vaccine  0.5 mL Intramuscular Tomorrow-1000  . insulin aspart  0-9 Units Subcutaneous Q4H  . lisinopril  5 mg Oral Daily  . oxymetazoline  2 spray Each Nare BID  . pantoprazole  40 mg Oral BID AC  . sodium chloride  3 mL Intravenous Q12H   Continuous Infusions:   PRN Meds:.gi cocktail, morphine injection, ondansetron (ZOFRAN) IV, polyethylene glycol, simethicone, sodium phosphate    LOS: 5 days   [facility]  [facility] Pager [phone]. If 8PM-8AM, please  contact night-coverage at [email or web address], password [facility] 07/13/2012, 9:39 AM  LOS: 5 days

## 2012-07-13 NOTE — Anesthesia Preprocedure Evaluation (Signed)
Anesthesia Evaluation  Patient identified by MRN, date of birth, ID band Patient awake    Reviewed: Allergy & Precautions, H&P , NPO status , Patient's Chart, lab work & pertinent test results, reviewed documented beta blocker date and time   History of Anesthesia Complications Negative for: history of anesthetic complications  Airway Mallampati: II TM Distance: >3 FB Neck ROM: Full    Dental  (+) Poor Dentition, Chipped and Dental Advisory Given   Pulmonary shortness of breath, sleep apnea and Continuous Positive Airway Pressure Ventilation , former smoker,  breath sounds clear to auscultation  Pulmonary exam normal       Cardiovascular hypertension, + CAD, + Cardiac Stents (11/13 RCA stent) and +CHF + Valvular Problems/Murmurs (moderate AS at 11/13 cath) AS Rhythm:Regular Rate:Normal + Systolic murmurs Ischemic cardiomyopathy, 10/13 EF 40-45%, mod AS   Neuro/Psych negative neurological ROS  negative psych ROS   GI/Hepatic Neg liver ROS, GERD-  Controlled,  Endo/Other  diabetes (glu 123), Well Controlled, Type 2, Oral Hypoglycemic AgentsMorbid obesity  Renal/GU negative Renal ROS     Musculoskeletal   Abdominal (+) + obese,   Peds  Hematology negative hematology ROS (+)   Anesthesia Other Findings   Reproductive/Obstetrics                           Anesthesia Physical Anesthesia Plan  ASA: III  Anesthesia Plan: MAC   Post-op Pain Management:    Induction:   Airway Management Planned: Natural Airway and Simple Face Mask  Additional Equipment:   Intra-op Plan:   Post-operative Plan:   Informed Consent: I have reviewed the patients History and Physical, chart, labs and discussed the procedure including the risks, benefits and alternatives for the proposed anesthesia with the patient or authorized representative who has indicated his/her understanding and acceptance.   Dental advisory  given  Plan Discussed with: CRNA and Surgeon  Anesthesia Plan Comments: (Plan routine monitors, MAC with local by surgeon)        Anesthesia Quick Evaluation

## 2012-07-13 NOTE — Progress Notes (Signed)
S; patient feels about the same, no new c/o O: Has multiple enlarged cervical LN. There is a readily accessible one right posterior cervical. I: Lymphadenopathy, likely some form of lymphoma P: Will excise the right posterior node. Discussed with patient and marked the node, answered questions. He has another larger node on left, near angle of mandible, but excision of this would be more difficult and is in proximity to the facial nerve

## 2012-07-14 LAB — GLUCOSE, CAPILLARY
Glucose-Capillary: 159 mg/dL — ABNORMAL HIGH (ref 70–99)
Glucose-Capillary: 147 mg/dL — ABNORMAL HIGH (ref 70–99)
Glucose-Capillary: 148 mg/dL — ABNORMAL HIGH (ref 70–99)

## 2012-07-14 LAB — CBC
HCT: 39.7 % (ref 39.0–52.0)
Hemoglobin: 13.6 g/dL (ref 13.0–17.0)
WBC: 14.2 10*3/uL — ABNORMAL HIGH (ref 4.0–10.5)

## 2012-07-14 LAB — BASIC METABOLIC PANEL
BUN: 13 mg/dL (ref 6–23)
CO2: 24 mEq/L (ref 19–32)
Chloride: 100 mEq/L (ref 96–112)
Glucose, Bld: 204 mg/dL — ABNORMAL HIGH (ref 70–99)
Potassium: 3.9 mEq/L (ref 3.5–5.1)

## 2012-07-14 MED ORDER — GI COCKTAIL ~~LOC~~: 30.0000 mL | Freq: Three times a day (TID) | ORAL | Status: DC | PRN

## 2012-07-14 MED ORDER — CARVEDILOL PHOSPHATE ER 10 MG PO CP24: 10.0000 mg | ORAL_CAPSULE | Freq: Every day | ORAL | Status: DC

## 2012-07-14 MED ORDER — PANTOPRAZOLE SODIUM 40 MG PO TBEC: 40.0000 mg | DELAYED_RELEASE_TABLET | Freq: Two times a day (BID) | ORAL | Status: DC

## 2012-07-14 NOTE — Discharge Summary (Signed)
Physician Discharge Summary  [name] [name] JYN:[id number] DOB: 19-Aug-1945 DOA: 07/08/2012  PCP: Default, Provider, MD  Admit date: 07/08/2012 Discharge date: 07/14/2012  Time spent: 35 minutes  Recommendations for Outpatient Follow-up:  1. Needs close f/u with Oncology-Patient is to call Dr. Alver Fisher office for an appointment 2. Interval f/u with [facility] prn 3. Needs CMET and cbc in about 1 week   Discharge Diagnoses:  Principal Problem:  *Pancreatitis Active Problems:  CAD, CTO RCA with colloat. S/P OM1 BMS 06/06/12  Ischemic cardiomyopathy, EF40-45% 2D 10/13  HTN (hypertension)  Diabetes mellitus, Type 2 NIDDM  Aortic stenosis, moderate at cath 06/06/12  Dyslipidemia, LDL 105  Leukocytosis  Smoker  Primary pancreatic adenocarcinoma??  Lymphadenopathy  Chronic combined systolic and diastolic CHF (congestive heart failure)  GERD (gastroesophageal reflux disease)   Discharge Condition: good  Diet recommendation: heart healthy low salt  Filed Weights   07/12/12 0609 07/13/12 0420 07/14/12 0452  Weight: 119.3 kg (263 lb 0.1 oz) 119.523 kg (263 lb 8 oz) 119.84 kg (264 lb 3.2 oz)    History of present illness:  66 yr old [name] with recent hospitalization for ACS s/p Stent to RCA presented 2 weeks ago 12/8 and got constipated. He eventually went to the [facility] and felt somewhat nauseated, but never threw up and never got anything donw.  He went to [facility] and was thought to be constipated and was given laxatives. He came to the [facility] ultimately 12/22 and was found to have an elevated lipase of 255 and CT scan abdomen showed bulky peri-pancreatic adenopathy.   Hospital Course:  1. Abdominal pain: Patient presented with a 2-week history of abdominal pain, constipation, poor appetite and occasional vomiting. Abdomina/pelvic CT demonstrated significant adenopathy surrounding the pancreas, and involving the retroperitoneum and mesentery throughout the abdomen and extending into the upper  pelvis. This adenopathy is very homogeneous and most typical of lymphoma. He also has a peripheral blood lymphocytosis and "shotty" cervical lymphadenopathy, which patient states was first noticed in 02/2012. Have consulted Dr Denny Levy, interventional radiology for possible CT-guided lymph node biopsy, and he has recommended chest CT scan. CT scan of the chest has been done. Serum LDH is within normal limits at 244. Peripheral blood flow cytometry pending. Interventional radiology recommended consulted surgery as per oncology recommendations. Surgery has been consulted and scheduling patient for biopsy today. Dr. Clelia Croft was curb-sided during hopsital stay and patient was encouraged to call his office as an out-patient to make sure this was follwed up  2. Pancreatitis: Patient was found to have a Lipase of 255 at presentation. LFTs are normal. He states that he quit drinking about 2 months ago, and imaging studies show no evidence of biliary or pancreatic ductal dilatation. Abdominal exam appears quite benign. He may have a mild pancreatitis, which resolved.   3. Gastroesophageal reflux disease  Continue PPI twice daily. GI cocktail when necessary-patient was given Rx's on d/c home for both  4. CAD s/p  BMS Integerity Stent to RCA with collateral status post OM1 BMS 06/06/2012. Patient appears clinically stable/asymptomatic from this view point. Dr. Herbie Baltimore of [facility] heart and vascular, consulted.  Discontinued effient.  Will d/c home on ASA alone per Cardiology rec's   5. CHF: Patient has known history of combined systolic/diastolic CHF. No clinical evidence of decompensation at this time. Normal saline lock IV fluids. Continue home doses of lasix  6. OSA: Stable on CPAP.   7. Hyponatremia; This is mild, and likely secondary to poor oral intake. Resolved.  Following lytes.   8. Diabetes mellitus: Type 2. Hemoglobin A1c 7.8 on 06/06/2012. CBGs have ranged from 123-255.  Start back metformin on d/c  home  9.leukocytosis Likely secondary to lymphadenopathy versus pancreatitis. Leukocytosis is trending down. Urinalysis is negative for UTI. CT chest is negative for any acute infiltrate however shows bulky supraclavicular subpectoral axillary mediastinal and hilar lymphadenopathy. No need for antibiotics at this time.    10.hypertension Continue Coreg and lisinopril.    Consultants:  Cardiology/[facility].  Jetty Peeks, MD, IR.  Dr Eli Hose, Hem-Onc.  General surgery: Meghan Dort PA/Dr. Jamey Ripa Procedures:  Abdominal X-Ray.  CT abdomen/pelvis.  CT of the abdomen and chest 07/09/2012 Antibiotics:  N/A.    Discharge Exam: Filed Vitals:   07/13/12 2036 07/14/12 0452 07/14/12 1003 07/14/12 1501  BP: 143/72 155/83 136/80 147/85  Pulse: 84 86 67 82  Temp: 98.1 F (36.7 C) 98.4 F (36.9 C)  98.2 F (36.8 C)  TempSrc: Oral Oral  Oral  Resp: 18 18 18 18   Height:      Weight:  119.84 kg (264 lb 3.2 oz)    SpO2: 97% 98% 100% 99%   Alert pleasant oriented, NAD Talking and laughing  General: alert opleasant nad Cardiovascular S1 s2 no HSM 4/6 Respiratory: clear  Discharge Instructions  Discharge Orders    Future Appointments: Provider: Department: Dept Phone: Center:   08/03/2012 10:00 AM [facility] Pre[facility] [phone] [facility]   08/17/2012 11:00 AM Iva Boop, MD  [facility] [phone] [facility]     Future Orders Please Complete By Expires   Diet - low sodium heart healthy      Increase activity slowly      Call MD for:  temperature >100.4      Call MD for:  severe uncontrolled pain      Call MD for:  redness, tenderness, or signs of infection (pain, swelling, redness, odor or green/yellow discharge around incision site)      Call MD for:  hives      Call MD for:  persistant dizziness or light-headedness          Medication List     As of 07/14/2012  4:14 PM    STOP taking these medications           prasugrel 10 MG Tabs   Commonly known as: EFFIENT      TAKE these medications         aspirin 81 MG EC tablet   Take 1 tablet (81 mg total) by mouth daily.      atorvastatin 20 MG tablet   Commonly known as: LIPITOR   Take 1 tablet (20 mg total) by mouth daily at 6 PM.      carvedilol 10 MG 24 hr capsule   Commonly known as: COREG CR   Take 1 capsule (10 mg total) by mouth daily.      furosemide 20 MG tablet   Commonly known as: LASIX   Take 20 mg by mouth daily.      gi cocktail Susp suspension   Take 30 mLs by mouth 3 (three) times daily as needed for indigestion. Shake well.      lisinopril 5 MG tablet   Commonly known as: PRINIVIL,ZESTRIL   Take 1 tablet (5 mg total) by mouth daily.      metFORMIN 500 MG tablet   Commonly known as: GLUCOPHAGE   Take 1 tablet (500 mg total) by mouth daily  with breakfast.      oxymetazoline 0.05 % nasal spray   Commonly known as: AFRIN   Place 2 sprays into the nose 2 (two) times daily as needed. For nasal congestion      pantoprazole 40 MG tablet   Commonly known as: PROTONIX   Take 1 tablet (40 mg total) by mouth 2 (two) times daily before a meal.      potassium chloride 10 MEQ tablet   Commonly known as: K-DUR,KLOR-CON   Take 10 mEq by mouth 2 (two) times daily.           Follow-up Information    Follow up with [facility], MD. Schedule an appointment as soon as possible for a visit in 2 weeks.   Contact information:   [address] [place] [address] [phone]           The results of significant diagnostics from this hospitalization (including imaging, microbiology, ancillary and laboratory) are listed below for reference.    Significant Diagnostic Studies: Dg Abd 1 View  07/06/2012  *RADIOLOGY REPORT*  Clinical Data: Abdominal pain extending to back.  ABDOMEN - 1 VIEW  Comparison: None.  Findings: Abnormal small bowel loop right mid abdomen which appears slightly prominent in size with mild fold  thickening.  Remainder of bowel gas pattern nonspecific and unremarkable.  Calcifications project over the left kidney which may represent vascular calcifications rather than renal calculi.  The possibility of free intraperitoneal air cannot be addressed on a supine view.  Degenerative changes lower lumbar spine.  IMPRESSION: Abnormal small bowel loop right mid abdomen which appears slightly prominent in size with mild fold thickening.Abnormal small bowel loop right mid abdomen which appears slightly prominent in size with mild fold thickening.   Original Report Authenticated By: Lacy Duverney, M.D.    Ct Chest W Contrast  07/09/2012  *RADIOLOGY REPORT*  Clinical Data: Lymphadenopathy.  CT CHEST WITH CONTRAST  Technique:  Multidetector CT imaging of the chest was performed following the standard protocol during bolus administration of intravenous contrast.  Contrast: 75mL OMNIPAQUE IOHEXOL 300 MG/ML  SOLN  Comparison: Abdominal CT scan 07/08/2012.  Findings: The chest wall demonstrates bulky supraclavicular, subpectoral and axillary lymphadenopathy.  The thyroid gland is grossly normal.  The bony thorax is intact.  No destructive bone lesions or spinal canal compromise.  There are advanced degenerative changes involving the shoulder joints and there are moderate degenerative changes involving the thoracic spine.  The heart is normal in size.  No pericardial effusion.  The aorta is normal in caliber.  Mild atherosclerotic calcifications. Coronary artery calcifications are noted.  There is bulky mediastinal and hilar lymphadenopathy. The esophagus is grossly normal.  Examination of the lung parenchyma demonstrates no acute pulmonary findings.  No worrisome pulmonary mass lesions or nodules.  No pleural effusion.  The upper abdomen demonstrates bulky adenopathy.  IMPRESSION:  1.  Bulky supraclavicular, subpectoral, axillary, mediastinal and hilar lymphadenopathy. 2.  No acute pulmonary findings or worrisome  pulmonary nodules.   Original Report Authenticated By: Rudie Meyer, M.D.    Ct Abdomen Pelvis W Contrast  07/08/2012  *RADIOLOGY REPORT*  Clinical Data: Severe abdominal pain  CT ABDOMEN AND PELVIS WITH CONTRAST  Technique:  Multidetector CT imaging of the abdomen and pelvis was performed following the standard protocol during bolus administration of intravenous contrast.  Contrast:  100 ml Omnipaque-300  Comparison: Abdomen films of 07/06/2012  Findings: The lung bases are clear.  The liver enhances with no focal abnormality  and no ductal dilatation is seen.  No calcified gallstones are seen.  The pancreas enhances with no focal abnormality.  However, there is significant adenopathy surrounding the head of the pancreas extending to the retroperitoneum and the mesentery.  One of the largest clusters of nodes is just caudal to the head of the pancreas measuring 7.9 x 3.9 cm.  Multiple nodes extend more caudally into the lower abdomen and upper pelvis. These nodes appear homogeneous, and lymphoma would be the primary consideration. There is a probable small right incidental adrenal adenoma present in the left adrenal gland is unremarkable.  The spleen is within normal limits in size.  The stomach is not well distended.  There is indentation upon the distal antrum of the stomach and the duodenum however by clusters of adenopathy.  The kidneys enhance and there is renovascular calcification present. No definite renal calculi are seen and on delayed images, no hydronephrosis is noted. A fatty attenuation area in the posterior right kidney may represent a small angiomyolipoma.  The abdominal aorta is normal in caliber.  The adenopathy extends into the upper abdomen.  Within the upper pelvis at approximately the level of the iliac crest there are several pathologically enlarged lymph nodes with short-axis diameter over 2 cm.  The urinary bladder is not well distended but is unremarkable.  The prostate is minimally  prominent.  There are multiple rectosigmoid colonic diverticula present.  The appendix and terminal ileum are unremarkable.  There are small lymph nodes along the iliac chains bilaterally.  Diffuse degenerative changes noted throughout the lumbar spine.  IMPRESSION:  1.  Adenopathy surrounding the pancreas, and involving the retroperitoneum and mesentery throughout the abdomen and extending into the upper pelvis.  This adenopathy is very homogeneous and most typical of lymphoma. 2.  Multiple rectosigmoid and distal descending colon diverticula. 3.  The appendix and terminal ileum are unremarkable.   Original Report Authenticated By: Dwyane Dee, M.D.     Microbiology: Recent Results (from the past 240 hour(s))  SURGICAL PCR SCREEN     Status: Normal   Collection Time   07/13/12  2:58 AM      Component Value Range Status Comment   MRSA, PCR NEGATIVE  NEGATIVE Final    Staphylococcus aureus NEGATIVE  NEGATIVE Final      Labs: Basic Metabolic Panel:  Lab 07/14/12 1610 07/13/12 0415 07/12/12 0405 07/11/12 0435 07/10/12 0610  NA 136 137 138 137 134*  K 3.9 4.2 4.3 4.2 4.6  CL 100 102 100 98 95*  CO2 24 24 26 22 25   GLUCOSE 204* 115* 119* 94 139*  BUN 13 13 13 15 14   CREATININE 1.03 1.11 1.12 1.08 1.09  CALCIUM 9.3 9.3 9.7 9.5 9.8  MG -- -- -- -- --  PHOS -- -- -- -- --   Liver Function Tests:  Lab 07/11/12 0435 07/10/12 0610 07/09/12 0435 07/08/12 1321  AST 22 21 21 17   ALT 20 22 21 22   ALKPHOS 84 92 86 86  BILITOT 0.6 0.7 0.6 0.6  PROT 6.9 7.4 6.9 7.4  ALBUMIN 4.0 4.3 3.8 4.2    Lab 07/12/12 0405 07/11/12 0435 07/10/12 0610 07/09/12 0917 07/08/12 1321  LIPASE 61* 73* 96* 118* 255*  AMYLASE -- -- -- -- --   No results found for this basename: AMMONIA:5 in the last 168 hours CBC:  Lab 07/14/12 0432 07/13/12 0415 07/12/12 0405 07/11/12 0435 07/10/12 0610 07/08/12 1321  WBC 14.2* 13.9* 14.1* 14.1* 17.7* --  NEUTROABS -- -- -- 5.2 -- 8.0*  HGB 13.6 13.6 14.7 14.3 15.0 --  HCT  39.7 39.4 43.0 42.0 43.4 --  MCV 84.5 83.1 83.3 84.2 83.6 --  PLT 220 217 228 211 230 --   Cardiac Enzymes: No results found for this basename: CKTOTAL:5,CKMB:5,CKMBINDEX:5,TROPONINI:5 in the last 168 hours BNP: BNP (last 3 results)  Basename 06/07/12 0445  PROBNP 897.7*   CBG:  Lab 07/14/12 1119 07/14/12 0737 07/14/12 0524 07/13/12 2147 07/13/12 1605  GLUCAP 147* 159* 170* 135* 255*       Signed:  Rhetta Mura  [facility] 07/14/2012, 4:14 PM

## 2012-07-14 NOTE — Progress Notes (Signed)
Pt d/c to home. D/c instructions and medications reviewed with Pt. Pt states understanding/ All Pt questions answered

## 2012-07-14 NOTE — Progress Notes (Signed)
1 Day Post-Op   Assessment: s/p Procedure(s): CERVICAL LYMPH GLAND EXCISION Patient Active Problem List  Diagnosis  . CAD, CTO RCA with colloat. S/P OM1 BMS 06/06/12  . Ischemic cardiomyopathy, EF40-45% 2D 10/13  . HTN (hypertension)  . Diabetes mellitus, Type 2 NIDDM  . Acute on chronic combined systolic and diastolic congestive heart failure  . Aortic stenosis, moderate at cath 06/06/12  . Dyslipidemia, LDL 105  . Leukocytosis  . Smoker  . Pancreatitis  . Primary pancreatic adenocarcinoma??  . Lymphadenopathy  . Chronic combined systolic and diastolic CHF (congestive heart failure)  . GERD (gastroesophageal reflux disease)    No problems post excision of right posterior cervical LN  Plan: We will see again PRN. No sututres to remove and he may shower and get the incision we. Does not need office F/U unless problems with incision  Subjective: Feels OK, no problems from surgery  Objective: Vital signs in last 24 hours: Temp:  [97.1 F (36.2 C)-98.4 F (36.9 C)] 98.4 F (36.9 C) (12/28 0452) Pulse Rate:  [67-86] 86  (12/28 0452) Resp:  [18-22] 18  (12/28 0452) BP: (113-155)/(68-83) 155/83 mmHg (12/28 0452) SpO2:  [93 %-100 %] 98 % (12/28 0452) Weight:  [264 lb 3.2 oz (119.84 kg)] 264 lb 3.2 oz (119.84 kg) (12/28 0452)   Intake/Output from previous day: 12/27 0701 - 12/28 0700 In: 1508.3 [P.O.:960; I.V.:548.3] Out: 1375 [Urine:1375] Intake/Output this shift:     General appearance: alert, cooperative and no distress  Incision: healing well  Lab Results:   Basename 07/14/12 0432 07/13/12 0415  WBC 14.2* 13.9*  HGB 13.6 13.6  HCT 39.7 39.4  PLT 220 217   BMET  Basename 07/14/12 0432 07/13/12 0415  NA 136 137  K 3.9 4.2  CL 100 102  CO2 24 24  GLUCOSE 204* 115*  BUN 13 13  CREATININE 1.03 1.11  CALCIUM 9.3 9.3   PT/INR No results found for this basename: LABPROT:2,INR:2 in the last 72 hours ABG No results found for this basename:  PHART:2,PCO2:2,PO2:2,HCO3:2 in the last 72 hours  MEDS, Scheduled    . aspirin  81 mg Oral Daily  . atorvastatin  20 mg Oral q1800  . carvedilol  10 mg Oral Daily  . docusate sodium  100 mg Oral BID  . influenza  inactive virus vaccine  0.5 mL Intramuscular Tomorrow-1000  . insulin aspart  0-9 Units Subcutaneous Q4H  . lisinopril  5 mg Oral Daily  . oxymetazoline  2 spray Each Nare BID  . pantoprazole  40 mg Oral BID AC  . sodium chloride  3 mL Intravenous Q12H    Studies/Results: No results found.    LOS: 6 days     Currie Paris, MD, [facility], [place] [phone]   07/14/2012 8:04 AM

## 2012-07-14 NOTE — Progress Notes (Signed)
[name]  Subjective: No CP, SOB or ABD pain.  Objective: Vital signs in last 24 hours: Temp:  [97.1 F (36.2 C)-98.4 F (36.9 C)] 98.4 F (36.9 C) (12/28 0452) Pulse Rate:  [67-86] 86  (12/28 0452) Resp:  [18-22] 18  (12/28 0452) BP: (113-155)/(68-83) 155/83 mmHg (12/28 0452) SpO2:  [93 %-100 %] 98 % (12/28 0452) Weight:  [119.84 kg (264 lb 3.2 oz)] 119.84 kg (264 lb 3.2 oz) (12/28 0452) Last BM Date: 07/13/12  Intake/Output from previous day: 12/27 0701 - 12/28 0700 In: 1508.3 [P.O.:960; I.V.:548.3] Out: 1375 [Urine:1375] Intake/Output this shift: Total I/O In: 600 [P.O.:600] Out: 300 [Urine:300]  Medications Current Facility-Administered Medications  Medication Dose Route Frequency Provider Last Rate Last Dose  . aspirin chewable tablet 81 mg  81 mg Oral Daily Rodolph Bong, MD   81 mg at 07/12/12 1041  . atorvastatin (LIPITOR) tablet 20 mg  20 mg Oral q1800 Rhetta Mura, MD   20 mg at 07/13/12 1702  . carvedilol (COREG CR) 24 hr capsule 10 mg  10 mg Oral Daily Rhetta Mura, MD   10 mg at 07/13/12 1020  . docusate sodium (COLACE) capsule 100 mg  100 mg Oral BID Rodolph Bong, MD   100 mg at 07/13/12 2144  . gi cocktail (Maalox,Lidocaine,Donnatal)  30 mL Oral TID PRN Rodolph Bong, MD   30 mL at 07/14/12 0541  . influenza  inactive virus vaccine (FLUZONE/FLUARIX) injection 0.5 mL  0.5 mL Intramuscular Tomorrow-1000 Rodolph Bong, MD      . insulin aspart (novoLOG) injection 0-9 Units  0-9 Units Subcutaneous Q4H Laveda Norman, MD   2 Units at 07/14/12 [phone]  . lisinopril (PRINIVIL,ZESTRIL) tablet 5 mg  5 mg Oral Daily Rodolph Bong, MD   5 mg at 07/12/12 1041  . morphine 4 MG/ML injection 4 mg  4 mg Intravenous Q3H PRN Rodolph Bong, MD   4 mg at 07/13/12 0317  . ondansetron (ZOFRAN) injection 4 mg  4 mg Intravenous Q6H PRN Laveda Norman, MD      . oxymetazoline (AFRIN) 0.05 % nasal spray 2 spray  2 spray  Each Nare BID Rhetta Mura, MD   2 spray at 07/13/12 2145  . pantoprazole (PROTONIX) EC tablet 40 mg  40 mg Oral BID AC Rodolph Bong, MD   40 mg at 07/14/12 0541  . polyethylene glycol (MIRALAX / GLYCOLAX) packet 17 g  17 g Oral Daily PRN Rodolph Bong, MD      . simethicone [facility]) chewable tablet 160 mg  160 mg Oral Q6H PRN Rodolph Bong, MD      . sodium chloride 0.9 % injection 3 mL  3 mL Intravenous Q12H Rhetta Mura, MD   3 mL at 07/13/12 2144  . sodium phosphate (FLEET) 7-19 GM/118ML enema 1 enema  1 enema Rectal Daily PRN Rodolph Bong, MD        PE: General appearance: alert, cooperative and no distress Neck: Mild errythema at incision site on the right side on his neck. Lungs: clear to auscultation bilaterally Heart: regular rate and rhythm and 2/6 systolic MM Abdomen: +BS,  nontender Extremities: Trace LEE Pulses: 2+ and symmetric 1+ pedal pulses. Neurologic: Grossly normal  Lab Results:   Basename 07/14/12 0432 07/13/12 0415 07/12/12 0405  WBC 14.2* 13.9* 14.1*  HGB 13.6 13.6 14.7  HCT 39.7 39.4 43.0  PLT 220 217 228   BMET  Basename 07/14/12 0432 07/13/12 0415 07/12/12 0405  NA 136 137 138  K 3.9 4.2 4.3  CL 100 102 100  CO2 24 24 26   GLUCOSE 204* 115* 119*  BUN 13 13 13   CREATININE 1.03 1.11 1.12  CALCIUM 9.3 9.3 9.7    Assessment/Plan  Principal Problem:  *Pancreatitis Active Problems:  CAD, CTO RCA with colloat. S/P OM1 BMS 06/06/12  Ischemic cardiomyopathy, EF40-45% 2D 10/13  HTN (hypertension)  Diabetes mellitus, Type 2 NIDDM  Aortic stenosis, moderate at cath 06/06/12  Dyslipidemia, LDL 105  Leukocytosis  Smoker  Primary pancreatic adenocarcinoma??  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  SP BMS to OM1 on 06/06/12.  ? When to restart Effient. The patient  also reports he is to have a colonoscopy at the end of January.  BP and HR stable.    LOS: 6 days    [name], [name] 07/14/2012 9:21 AM   Patient seen and examined. Agree with assessment and plan. Pt had a BMS Integrity Medtronic 2.5 x18 mm stent (not DES) inserted into the ostium of OM1 branch of LCX ~5 weeks ago. Therefore, no need to resume effient particularly since he will be needing other procedures. Will continue with ASA alone unless recurrent cardiac symptomotology.   Lennette Bihari, MD, [facility] 07/14/2012 9:54 AM

## 2012-07-16 ENCOUNTER — Encounter (HOSPITAL_COMMUNITY): Payer: Self-pay | Admitting: Surgery

## 2012-07-16 NOTE — Progress Notes (Signed)
Received a call from Daughter Jerald Kief regarding KDur Rx which she wanted a rx for.  Called in to [facility]. Kdur 20 bid Patient requesting information re status of Pathology from [facility] perfomred 12.27.  Explained to her that on review of chart this is not available at present.  Explained this numerous times.  Patient insistent to know result this week.  She has an appt with Dr. Clelia Croft in 2 weeks.   Patient goes on to state that she would like me to find this out.  Again explained I am not sure if this result is available Patient has no PCP.  I have infomred her I am avaiable until tomorrow and will try to call her if I get a result She understands  Pleas Koch, MD [facility] [phone]

## 2012-07-17 ENCOUNTER — Telehealth: Payer: Self-pay | Admitting: Oncology

## 2012-07-17 ENCOUNTER — Other Ambulatory Visit: Payer: Self-pay | Admitting: Oncology

## 2012-07-17 DIAGNOSIS — D72829 Elevated white blood cell count, unspecified: Secondary | ICD-10-CM

## 2012-07-17 DIAGNOSIS — C8590 Non-Hodgkin lymphoma, unspecified, unspecified site: Secondary | ICD-10-CM

## 2012-07-17 NOTE — Telephone Encounter (Signed)
S/W pt dtr in re NP appt 01/02 @ 10:30 w/Dr. Clelia Croft. Welcome packet given at registration.

## 2012-07-17 NOTE — Significant Event (Signed)
Received a call from Pathologist-He has potential NH Lymphoma.  Flow cytometry is still pending. Recommended to follow-up with Dr. Clelia Croft for further input as per appt.  Will forward note to him for follow up    Pleas Koch, MD [facility] [phone]

## 2012-07-19 ENCOUNTER — Ambulatory Visit: Payer: Medicare Other

## 2012-07-19 ENCOUNTER — Telehealth: Payer: Self-pay | Admitting: Oncology

## 2012-07-19 ENCOUNTER — Encounter: Payer: Self-pay | Admitting: Oncology

## 2012-07-19 ENCOUNTER — Other Ambulatory Visit (INDEPENDENT_AMBULATORY_CARE_PROVIDER_SITE_OTHER): Payer: Self-pay | Admitting: Surgery

## 2012-07-19 ENCOUNTER — Other Ambulatory Visit (HOSPITAL_BASED_OUTPATIENT_CLINIC_OR_DEPARTMENT_OTHER): Payer: Medicare Other | Admitting: Lab

## 2012-07-19 ENCOUNTER — Ambulatory Visit (HOSPITAL_BASED_OUTPATIENT_CLINIC_OR_DEPARTMENT_OTHER): Payer: Medicare Other | Admitting: Oncology

## 2012-07-19 VITALS — BP 130/73 | HR 79 | Temp 97.5°F | Resp 20 | Ht 71.5 in | Wt 268.0 lb

## 2012-07-19 DIAGNOSIS — C8589 Other specified types of non-Hodgkin lymphoma, extranodal and solid organ sites: Secondary | ICD-10-CM

## 2012-07-19 DIAGNOSIS — E119 Type 2 diabetes mellitus without complications: Secondary | ICD-10-CM

## 2012-07-19 DIAGNOSIS — I10 Essential (primary) hypertension: Secondary | ICD-10-CM

## 2012-07-19 DIAGNOSIS — D72829 Elevated white blood cell count, unspecified: Secondary | ICD-10-CM

## 2012-07-19 DIAGNOSIS — C8590 Non-Hodgkin lymphoma, unspecified, unspecified site: Secondary | ICD-10-CM

## 2012-07-19 LAB — CBC WITH DIFFERENTIAL/PLATELET
BASO%: 0.7 % (ref 0.0–2.0)
LYMPH%: 51.6 % — ABNORMAL HIGH (ref 14.0–49.0)
MCHC: 33.7 g/dL (ref 32.0–36.0)
MONO#: 0.7 10*3/uL (ref 0.1–0.9)
Platelets: 241 10*3/uL (ref 140–400)
RBC: 4.85 10*6/uL (ref 4.20–5.82)
WBC: 15.1 10*3/uL — ABNORMAL HIGH (ref 4.0–10.3)

## 2012-07-19 LAB — TECHNOLOGIST REVIEW

## 2012-07-19 LAB — COMPREHENSIVE METABOLIC PANEL (CC13)
ALT: 17 U/L (ref 0–55)
Alkaline Phosphatase: 87 U/L (ref 40–150)
CO2: 29 mEq/L (ref 22–29)
Sodium: 139 mEq/L (ref 136–145)
Total Bilirubin: 0.42 mg/dL (ref 0.20–1.20)
Total Protein: 6.7 g/dL (ref 6.4–8.3)

## 2012-07-19 LAB — LACTATE DEHYDROGENASE (CC13): LDH: 222 U/L (ref 125–245)

## 2012-07-19 MED ORDER — ONDANSETRON HCL 8 MG PO TABS: 8.0000 mg | ORAL_TABLET | Freq: Three times a day (TID) | ORAL | Status: DC | PRN

## 2012-07-19 NOTE — Progress Notes (Signed)
Checked in new patient. No financial issues. °

## 2012-07-19 NOTE — Telephone Encounter (Signed)
C/D 07/20/11 for appt. 07/19/12

## 2012-07-19 NOTE — Progress Notes (Signed)
Note dictated

## 2012-07-19 NOTE — Progress Notes (Signed)
Patient said still has not received his insurance card. I advised him make sure he brings back on next appointment.

## 2012-07-19 NOTE — Telephone Encounter (Signed)
Per staff message and POF I have scheduled appts.  JMW  

## 2012-07-20 ENCOUNTER — Telehealth (INDEPENDENT_AMBULATORY_CARE_PROVIDER_SITE_OTHER): Payer: Self-pay

## 2012-07-20 NOTE — Progress Notes (Signed)
CC:   [name] [name], M.D. [name] [name], M.D.  REASON FOR CONSULTATION:  Leukocytosis and lymphadenopathy.  HISTORY OF PRESENT ILLNESS:  [name] [name] is a pleasant 67 year old gentleman currently of [place].  This is a gentleman with past medical history significant for diabetes and recent coronary artery disease who really has not been receiving routine medical care, does not have a regular primary care doctor.  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  He was diagnosed with coronary artery disease after a hospitalization for acute coronary syndrome in November 2013.  At that time, he had a stent placed in the RCA.  After his discharge, he did well for a period of time and started developing constipation, nausea.  Despite given laxatives, he continued to have abdominal discomfort.  He presented again to [facility] and was hospitalized on December 22nd.  During that hospitalization he was noted to have leukocytosis with white cell count ranging between 14,000-15,000 with lymphocytosis.  He had a CT scan of the abdomen on December 22nd which showed enlarging lymphadenopathy, a large cluster was noted around a caudal head of the pancreas measuring 7.9 x 3.9 cm.  Multiple nodes extend more caudally into lower abdomen and pelvis.  The adenopathy, again, extends into the upper abdomen.  Within the upper pelvis approximately the level of the iliac crest there are several pathologically enlarged lymph nodes over 2 cm.  He also had a CT scan of the chest on December 23rd which showed bulky supraclavicular, subpectoral, axillary, and mediastinal lymphadenopathy.  At that time, General Surgery evaluated the patient, Dr. Jamey Ripa.  Performed a lymph node biopsy that was done on 07/13/2012.  He was discharged on 07/14/2012 and has done very well since his discharge.  His abdominal pain has  resolved.  He is able to eat without any problems.  He is not reporting any constipation, nausea, diarrhea, or abdominal pain.  He resumed most activities of daily living without any hindrance or decline.  He has not been driving yet, but certainly is capable of doing that at this time.  PAST MEDICAL HISTORY:  Significant for hypertension, diabetes, hyperlipidemia.  He does have history of arthritis, as well as well as ischemic cardiomyopathy.  He is status post coronary angioplasty.  FAMILY HISTORY:  His father had diabetes, mother had Alzheimer disease. No history of any malignancy.  SOCIAL HISTORY:  He used to be a heavy smoker, he quit just recently, had a 53-pack-year smoking history.  There is also a history of alcohol abuse, but quit about 22 years.  ALLERGIES:  He is not allergic to any medication.  MEDICATION:  Reviewed today.  He is on potassium chloride, Protonix, Afrin, Zofran, Glucophage, lisinopril, Lasix, Coreg, Lipitor, aspirin.  PHYSICAL EXAM:  General:  Alert, awake gentleman, appeared in no active distress today.  Vital Signs:  Blood pressure is 130/73, pulse 79, respiration 20, ECOG performance status is 1.  Weight is 268 pounds. HEENT:  Head is normocephalic, atraumatic.  Pupils equal, round, reactive to light.  Oral mucosa moist and pink.  Neck:  Supple.  There are some small cervical lymph nodes noted.  Chest:  Clear to auscultation.  No rhonchi, wheeze, or dullness to percussion.  Heart: Regular rate and rhythm.  S1 and S2.  Abdomen:  Soft, nontender.  No hepatosplenomegaly.  Extremities:  No clubbing, cyanosis, or edema. Neurologic:  Intact motor,  sensory, and deep tendon reflexes.  LABORATORY DATA:  Today showed a hemoglobin of 14.3, white cell count of 15, platelet count of 241.  Chemistry showed a creatinine of 1.4, glucose __________.  He has lymphocytosis of 51% lymphocytes.  PATHOLOGY:  The case was reviewed with Dr. Colonel Bald, the  reviewing pathologist.  The flow cytometry showed a monoclonal B cell population with expression of CD5, CD19, 20, 21, and 22, and these are lambda restricted.  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  ASSESSMENT AND PLAN:  This is a 67 year old gentleman with the following issues:  1. 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  I had a lengthy discussion today with [name] [name] and     his daughter, discussing the natural course of a small lymphocytic     lymphoma today.  He appeared to have widespread disease with     disease above and below the diaphragm, indicating a stage IIIA     disease.  Certainly, given the indwelling nature of a small     lymphocytic lymphoma, there is really no urgency to treat Mr.     [name]; however, he does have rather bulky disease with a large     mass around the pancreas that makes it more susceptible to     developing episodes of pancreatitis which is possibly what he     developed recently.  It could also cause his bowel obstruction,     among other complications, and for that reason I am in favor for     treating him sooner rather than later.  Treatment regimens for     chronic lymphocytic leukemia were discussed that include     fludarabine, cyclophosphamide, rituximab regimen versus     bendamustine and rituximab regimen versus Adriamycin-based     regimens.  The risks and benefits of these treatments were     discussed.  I feel bendamustine and rituximab is a reasonable     regimen, given his cardiac history as well as his history of     diabetes.  Complications from this regimen were discussed today     including infusion-related toxicity with rituximab that includes     hives, pruritus, and rarely, but possibly anaphylaxis.     Complications for the bendamustine including myelosuppression,     neutropenia, neutropenic fever, and neutropenic  sepsis are rare,     but definitely a possibility.  Gastrointestinal toxicity including     nausea and vomiting are also possible complications.  I intend to     start him in the near future, around January 15th or so, after a     chemotherapy education class. 2. Nausea prophylaxis.  I have given him a prescription for Zofran. 3. Intravenous access.  I am going to ask Dr. Jamey Ripa or colleagues to     kindly place a Port-A-Cath in anticipation of starting     chemotherapy.  Risks and benefits of having a Port-A-Cath were     discussed, complications that include thrombosis, bleeding,     infection were discussed today and certainly with the caveat that     after the completion of chemotherapy, this can be removed.  All his questions were answered today and we will anticipate, again, starting chemotherapy around January 15th.    ______________________________ Benjiman Core, M.D. FNS/MEDQ  D:  07/19/2012  T:  07/19/2012  Job:  [id number]

## 2012-07-20 NOTE — Telephone Encounter (Signed)
The pt just got scheduled for a portacath placement on 1/13.  I asked him questions about his history and he does have a history of MI, stent and murmur.  I asked him to stop his Aspirin 5 days preop.  I informed Dr Jamey Ripa and he offered to see the pt preop if he wanted to talk.  The pt declined and said Dr Jamey Ripa did such a good job on his neck that he has confidence in him.  I told him that Dr Jamey Ripa will just see him preop the day of surgery.  I also told him he was checked over by Cardiology in the hospital so he should be ok to stop the Aspirin.

## 2012-07-24 ENCOUNTER — Other Ambulatory Visit: Payer: Medicare Other

## 2012-07-24 ENCOUNTER — Encounter: Payer: Self-pay | Admitting: *Deleted

## 2012-07-25 ENCOUNTER — Encounter (HOSPITAL_BASED_OUTPATIENT_CLINIC_OR_DEPARTMENT_OTHER): Payer: Self-pay | Admitting: *Deleted

## 2012-07-25 ENCOUNTER — Telehealth (INDEPENDENT_AMBULATORY_CARE_PROVIDER_SITE_OTHER): Payer: Self-pay

## 2012-07-25 NOTE — Telephone Encounter (Signed)
Pt called for instructions on taking his meds prior to [facility] placement on 07/30/12.  He was instructed to stop his ASA today, and follow his pre-op nurses' instructions on his other medications.  Pt had a hard time understanding these instructions which were repeated several times.

## 2012-07-25 NOTE — Progress Notes (Signed)
Pt was in hospital 11/13 for MI cath-stent Then in 112/13 with dx lymphoma-having PAC-called dr Landry Dyke office to make them aware of this per dr Gelene Mink Dr Jamey Ripa to tell him if he can stop asa

## 2012-07-27 NOTE — Progress Notes (Signed)
Cleared by dr Sampson Goon

## 2012-07-30 ENCOUNTER — Encounter (HOSPITAL_BASED_OUTPATIENT_CLINIC_OR_DEPARTMENT_OTHER): Payer: Self-pay | Admitting: Anesthesiology

## 2012-07-30 ENCOUNTER — Ambulatory Visit (HOSPITAL_BASED_OUTPATIENT_CLINIC_OR_DEPARTMENT_OTHER): Payer: Medicare Other | Admitting: Anesthesiology

## 2012-07-30 ENCOUNTER — Encounter (HOSPITAL_BASED_OUTPATIENT_CLINIC_OR_DEPARTMENT_OTHER)
Admission: RE | Disposition: A | Discharge status: Home / Self Care | Payer: Self-pay | Source: Ambulatory Visit | Attending: Surgery

## 2012-07-30 ENCOUNTER — Ambulatory Visit (HOSPITAL_COMMUNITY): Payer: Medicare Other

## 2012-07-30 ENCOUNTER — Encounter (HOSPITAL_BASED_OUTPATIENT_CLINIC_OR_DEPARTMENT_OTHER): Payer: Self-pay | Admitting: *Deleted

## 2012-07-30 ENCOUNTER — Ambulatory Visit (HOSPITAL_BASED_OUTPATIENT_CLINIC_OR_DEPARTMENT_OTHER)
Admission: RE | Admit: 2012-07-30 | Discharge: 2012-07-30 | Disposition: A | Discharge status: Home / Self Care | Payer: Medicare Other | Source: Ambulatory Visit | Attending: Surgery | Admitting: Surgery

## 2012-07-30 DIAGNOSIS — I10 Essential (primary) hypertension: Secondary | ICD-10-CM | POA: Insufficient documentation

## 2012-07-30 DIAGNOSIS — K219 Gastro-esophageal reflux disease without esophagitis: Secondary | ICD-10-CM | POA: Insufficient documentation

## 2012-07-30 DIAGNOSIS — G4730 Sleep apnea, unspecified: Secondary | ICD-10-CM | POA: Insufficient documentation

## 2012-07-30 DIAGNOSIS — I509 Heart failure, unspecified: Secondary | ICD-10-CM | POA: Insufficient documentation

## 2012-07-30 DIAGNOSIS — R011 Cardiac murmur, unspecified: Secondary | ICD-10-CM | POA: Insufficient documentation

## 2012-07-30 DIAGNOSIS — C8589 Other specified types of non-Hodgkin lymphoma, extranodal and solid organ sites: Secondary | ICD-10-CM | POA: Insufficient documentation

## 2012-07-30 DIAGNOSIS — Z87891 Personal history of nicotine dependence: Secondary | ICD-10-CM | POA: Insufficient documentation

## 2012-07-30 DIAGNOSIS — I2510 Atherosclerotic heart disease of native coronary artery without angina pectoris: Secondary | ICD-10-CM | POA: Insufficient documentation

## 2012-07-30 DIAGNOSIS — C8590 Non-Hodgkin lymphoma, unspecified, unspecified site: Secondary | ICD-10-CM

## 2012-07-30 DIAGNOSIS — I252 Old myocardial infarction: Secondary | ICD-10-CM | POA: Insufficient documentation

## 2012-07-30 DIAGNOSIS — E669 Obesity, unspecified: Secondary | ICD-10-CM | POA: Insufficient documentation

## 2012-07-30 DIAGNOSIS — N289 Disorder of kidney and ureter, unspecified: Secondary | ICD-10-CM | POA: Insufficient documentation

## 2012-07-30 HISTORY — PX: PORTACATH PLACEMENT: SHX2246

## 2012-07-30 HISTORY — DX: Acute myocardial infarction, unspecified: I21.9

## 2012-07-30 HISTORY — DX: Atherosclerotic heart disease of native coronary artery without angina pectoris: I25.10

## 2012-07-30 SURGERY — INSERTION, TUNNELED CENTRAL VENOUS DEVICE, WITH PORT
Anesthesia: Monitor Anesthesia Care | Site: Chest | Laterality: Right | Wound class: Clean

## 2012-07-30 MED ORDER — FENTANYL CITRATE 0.05 MG/ML IJ SOLN
INTRAMUSCULAR | Status: DC | PRN
  Administered 2012-07-30 (×3): 50 ug via INTRAVENOUS

## 2012-07-30 MED ORDER — CHLORHEXIDINE GLUCONATE 4 % EX LIQD: 1.0000 "application " | Freq: Once | CUTANEOUS | Status: DC

## 2012-07-30 MED ORDER — CEFAZOLIN SODIUM-DEXTROSE 2-3 GM-% IV SOLR
2.0000 g | INTRAVENOUS | Status: AC
  Administered 2012-07-30: 2 g via INTRAVENOUS

## 2012-07-30 MED ORDER — MIDAZOLAM HCL 2 MG/2ML IJ SOLN: 0.5000 mg | Freq: Once | INTRAMUSCULAR | Status: DC | PRN

## 2012-07-30 MED ORDER — OXYCODONE HCL 5 MG PO TABS: 5.0000 mg | ORAL_TABLET | Freq: Once | ORAL | Status: DC | PRN

## 2012-07-30 MED ORDER — LIDOCAINE HCL (PF) 1 % IJ SOLN
INTRAMUSCULAR | Status: DC | PRN
  Administered 2012-07-30: 23 mL

## 2012-07-30 MED ORDER — PROPOFOL 10 MG/ML IV BOLUS
INTRAVENOUS | Status: DC | PRN
  Administered 2012-07-30: 20 mg via INTRAVENOUS

## 2012-07-30 MED ORDER — FENTANYL CITRATE 0.05 MG/ML IJ SOLN: 25.0000 ug | INTRAMUSCULAR | Status: DC | PRN

## 2012-07-30 MED ORDER — PROPOFOL INFUSION 10 MG/ML OPTIME
INTRAVENOUS | Status: DC | PRN
  Administered 2012-07-30: 50 ug/kg/min via INTRAVENOUS

## 2012-07-30 MED ORDER — PROMETHAZINE HCL 25 MG/ML IJ SOLN: 6.2500 mg | INTRAMUSCULAR | Status: DC | PRN

## 2012-07-30 MED ORDER — OXYCODONE HCL 5 MG/5ML PO SOLN: 5.0000 mg | Freq: Once | ORAL | Status: DC | PRN

## 2012-07-30 MED ORDER — MEPERIDINE HCL 25 MG/ML IJ SOLN: 6.2500 mg | INTRAMUSCULAR | Status: DC | PRN

## 2012-07-30 MED ORDER — HEPARIN (PORCINE) IN NACL 2-0.9 UNIT/ML-% IJ SOLN
INTRAMUSCULAR | Status: DC | PRN
  Administered 2012-07-30: 500 mL via INTRAVENOUS

## 2012-07-30 MED ORDER — HEPARIN SOD (PORK) LOCK FLUSH 100 UNIT/ML IV SOLN
INTRAVENOUS | Status: DC | PRN
  Administered 2012-07-30: 500 [IU] via INTRAVENOUS

## 2012-07-30 MED ORDER — HYDROCODONE-ACETAMINOPHEN 5-325 MG PO TABS: 1.0000 | ORAL_TABLET | ORAL | Status: DC | PRN

## 2012-07-30 MED ORDER — ONDANSETRON HCL 4 MG/2ML IJ SOLN
INTRAMUSCULAR | Status: DC | PRN
  Administered 2012-07-30: 4 mg via INTRAVENOUS

## 2012-07-30 MED ORDER — LACTATED RINGERS IV SOLN
INTRAVENOUS | Status: DC
  Administered 2012-07-30 (×2): via INTRAVENOUS

## 2012-07-30 MED ORDER — NITROGLYCERIN 0.4 MG SL SUBL
SUBLINGUAL_TABLET | SUBLINGUAL | Status: DC | PRN
  Administered 2012-07-30: 0.4 mg via SUBLINGUAL

## 2012-07-30 MED ORDER — MIDAZOLAM HCL 5 MG/5ML IJ SOLN
INTRAMUSCULAR | Status: DC | PRN
  Administered 2012-07-30: 2 mg via INTRAVENOUS

## 2012-07-30 SURGICAL SUPPLY — 50 items
ADH SKN CLS APL DERMABOND .7 (GAUZE/BANDAGES/DRESSINGS) ×1
APL SKNCLS STERI-STRIP NONHPOA (GAUZE/BANDAGES/DRESSINGS) ×1
BAG DECANTER FOR FLEXI CONT (MISCELLANEOUS) ×2 IMPLANT
BENZOIN TINCTURE PRP APPL 2/3 (GAUZE/BANDAGES/DRESSINGS) ×2 IMPLANT
BLADE HEX COATED 2.75 (ELECTRODE) ×2 IMPLANT
BLADE SURG 15 STRL LF DISP TIS (BLADE) ×1 IMPLANT
BLADE SURG 15 STRL SS (BLADE) ×2
CANISTER SUCTION 1200CC (MISCELLANEOUS) IMPLANT
CHLORAPREP W/TINT 26ML (MISCELLANEOUS) ×2 IMPLANT
CLOTH BEACON ORANGE TIMEOUT ST (SAFETY) ×2 IMPLANT
COVER MAYO STAND STRL (DRAPES) ×2 IMPLANT
COVER TABLE BACK 60X90 (DRAPES) ×2 IMPLANT
DECANTER SPIKE VIAL GLASS SM (MISCELLANEOUS) IMPLANT
DERMABOND ADVANCED (GAUZE/BANDAGES/DRESSINGS) ×1
DERMABOND ADVANCED .7 DNX12 (GAUZE/BANDAGES/DRESSINGS) ×1 IMPLANT
DRAPE C-ARM 42X72 X-RAY (DRAPES) ×2 IMPLANT
DRAPE LAPAROTOMY TRNSV 102X78 (DRAPE) ×2 IMPLANT
DRAPE UTILITY XL STRL (DRAPES) ×4 IMPLANT
ELECT REM PT RETURN 9FT ADLT (ELECTROSURGICAL) ×2
ELECTRODE REM PT RTRN 9FT ADLT (ELECTROSURGICAL) ×1 IMPLANT
GLOVE EUDERMIC 7 POWDERFREE (GLOVE) ×2 IMPLANT
GOWN PREVENTION PLUS XLARGE (GOWN DISPOSABLE) ×4 IMPLANT
IV CATH PLACEMENT UNIT 16 GA (IV SOLUTION) ×4 IMPLANT
IV HEPARIN 1000UNITS/500ML (IV SOLUTION) ×2 IMPLANT
IV KIT MINILOC 20X1 SAFETY (NEEDLE) IMPLANT
KIT BARDPORT ISP (Port) IMPLANT
KIT PORT POWER 8FR ISP CVUE (Catheter) ×2 IMPLANT
KIT PORT POWER ISP 8FR (Catheter) IMPLANT
KIT POWER PORT SLIM 6FR (PORTABLE EQUIPMENT SUPPLIES) IMPLANT
KIT PROBE COVER (MISCELLANEOUS) ×4 IMPLANT
NEEDLE HYPO 25X1 1.5 SAFETY (NEEDLE) ×2 IMPLANT
NEEDLE SPNL 18GX3.5 QUINCKE PK (NEEDLE) ×2 IMPLANT
PACK BASIN DAY SURGERY FS (CUSTOM PROCEDURE TRAY) ×2 IMPLANT
PENCIL BUTTON HOLSTER BLD 10FT (ELECTRODE) ×2 IMPLANT
SET SHEATH INTRODUCER 10FR (MISCELLANEOUS) ×2 IMPLANT
SHEATH COOK PEEL AWAY SET 9F (SHEATH) IMPLANT
SHEET MEDIUM DRAPE 40X70 STRL (DRAPES) ×2 IMPLANT
SLEEVE SCD COMPRESS KNEE MED (MISCELLANEOUS) ×2 IMPLANT
STAPLER VISISTAT (STAPLE) IMPLANT
STRIP CLOSURE SKIN 1/2X4 (GAUZE/BANDAGES/DRESSINGS) ×4 IMPLANT
SUT MNCRL AB 4-0 PS2 18 (SUTURE) ×2 IMPLANT
SUT PROLENE 2 0 SH DA (SUTURE) ×2 IMPLANT
SUT VICRYL 3-0 CR8 SH (SUTURE) ×2 IMPLANT
SYR 5ML LUER SLIP (SYRINGE) ×2 IMPLANT
SYR CONTROL 10ML LL (SYRINGE) ×2 IMPLANT
TOWEL OR 17X24 6PK STRL BLUE (TOWEL DISPOSABLE) ×2 IMPLANT
TOWEL OR NON WOVEN STRL DISP B (DISPOSABLE) IMPLANT
TUBE CONNECTING 20X1/4 (TUBING) IMPLANT
WATER STERILE IRR 1000ML POUR (IV SOLUTION) IMPLANT
YANKAUER SUCT BULB TIP NO VENT (SUCTIONS) IMPLANT

## 2012-07-30 NOTE — Op Note (Signed)
[name] [name] 1945-07-27 [id number] 07/20/2012  Preoperative diagnosis: PAC needed  Postoperative diagnosis: Same  Procedure: Portacath Placement  Surgeon: Currie Paris, MD, FACS  Anesthesia: MAC  Clinical History and Indications: The patient is getting ready to begin chemotherapy for his cancer. He  needs a Port-A-Cath for venous access.  Description of Procedure: I have seen the patient in the holding area and confirmed the plans for the procedure as noted above. I reviewed the risks and complications again and the patient has no further questions. He wishes to proceed.   The patient was then taken to the operating room. After satisfactory MAC anesthesia had been obtained the upper chest and lower neck were prepped and draped as a sterile field. The timeout was done.  I attempted to enter the right subclavian vein, but was unable to access it. I appeared to run into bone where the clavicle and first rib are close together. I also then developed a more lateral but continued to run into bony prominences. I therefore used the ultrasound to identify the right internal jugular vein. I was able to enter it with a finder needle and then with the 18-gauge needle. The guidewire threaded easily. Using fluoroscopy it appeared to have made a turn so was deflated under fluoroscopic guidance into the superior vena cava right atrial area.  Additional local was infiltrated, a transverse incision made in a pocket made for the port placement.  The port tubing was then brought through a subcutaneous tunnel from the port site to the guidewire site. The dilator and peel-away sheath were then advanced over the guidewire while monitoring this with fluoroscopy. The guidewire and dilator were removed and the tubing threaded to approximately 22 cm. The peel-away sheath was then removed. The catheter aspirated and flushed easily. Using fluoroscopy the tip was backed out into the superior vena cava right atrial  junction area. It aspirated and flushed easily. The reservoir was attached and the locking mechanism engaged. That aspirated and flushed easily.  The reservoir was secured to the fascia with 2-0 Prolene. A final check with fluoroscopy was done to make sure we had no kinks and good positioning of the tip of the catheter. Everything appeared to be okay. The catheter was aspirated, flushed with dilute heparin and then concentrated aqueous heparin.  The incision was then closed with interrupted 3-0 Vicryl, and 4-0 Monocryl subcuticular with Dermabond on the skin.  There were no operative complications. Estimated blood loss was minimal. All counts were correct. The patient tolerated the procedure well.  Currie Paris, MD, FACS 07/30/2012 1:04 PM

## 2012-07-30 NOTE — Anesthesia Postprocedure Evaluation (Signed)
  Anesthesia Post-op Note  Patient: [name] [name]  Procedure(s) Performed: Procedure(s) (LRB) with comments: INSERTION PORT-A-CATH (Right)  Patient Location: PACU  Anesthesia Type:MAC  Level of Consciousness: awake, alert , oriented and patient cooperative  Airway and Oxygen Therapy: Patient Spontanous Breathing  Post-op Pain: none  Post-op Assessment: Post-op Vital signs reviewed, Patient's Cardiovascular Status Stable, Respiratory Function Stable, Patent Airway, No signs of Nausea or vomiting and Pain level controlled, CXR no active Cardiopulmonary disease, 12 lead EKG unchanged, no ST-T abnormalities  Post-op Vital Signs: Reviewed and stable  Complications: No apparent anesthesia complications

## 2012-07-30 NOTE — Anesthesia Preprocedure Evaluation (Addendum)
Anesthesia Evaluation  Patient identified by MRN, date of birth, ID band Patient awake    Reviewed: Allergy & Precautions, H&P , NPO status , Patient's Chart, lab work & pertinent test results, reviewed documented beta blocker date and time   History of Anesthesia Complications Negative for: history of anesthetic complications  Airway Mallampati: II TM Distance: >3 FB Neck ROM: Full    Dental  (+) Dental Advisory Given and Poor Dentition   Pulmonary shortness of breath, with exertion and at rest, sleep apnea , former smoker,  breath sounds clear to auscultation  Pulmonary exam normal       Cardiovascular hypertension, Pt. on medications and Pt. on home beta blockers + CAD, + Past MI, + Cardiac Stents and +CHF + Valvular Problems/Murmurs AS Rhythm:Regular Rate:Normal + Systolic murmurs 16/10 Echo: EF 40-45% 11/13 cath: mod AS, BM Stent to Cx   Neuro/Psych    GI/Hepatic Neg liver ROS, GERD-  Medicated and Controlled,  Endo/Other  diabetes, Well Controlled, Type 2, Oral Hypoglycemic AgentsMorbid obesity  Renal/GU Renal InsufficiencyRenal disease (creat 1.4)negative Renal ROS     Musculoskeletal   Abdominal (+) + obese,   Peds  Hematology  (+) Blood dyscrasia (lymphoma), ,   Anesthesia Other Findings   Reproductive/Obstetrics                          Anesthesia Physical Anesthesia Plan  ASA: III  Anesthesia Plan: MAC   Post-op Pain Management:    Induction:   Airway Management Planned: Simple Face Mask and Natural Airway  Additional Equipment:   Intra-op Plan:   Post-operative Plan:   Informed Consent: I have reviewed the patients History and Physical, chart, labs and discussed the procedure including the risks, benefits and alternatives for the proposed anesthesia with the patient or authorized representative who has indicated his/her understanding and acceptance.   Dental advisory  given  Plan Discussed with: CRNA and Surgeon  Anesthesia Plan Comments: (Plan routine monitors, MAC with local by surgeon)       Anesthesia Quick Evaluation

## 2012-07-30 NOTE — Transfer of Care (Signed)
Immediate Anesthesia Transfer of Care Note  Patient: [name] [name]  Procedure(s) Performed: Procedure(s) (LRB) with comments: INSERTION PORT-A-CATH (Right)  Patient Location: PACU  Anesthesia Type:MAC  Level of Consciousness: awake, alert  and oriented  Airway & Oxygen Therapy: Patient Spontanous Breathing and Patient connected to face mask oxygen  Post-op Assessment: Report given to PACU RN and Post -op Vital signs reviewed and stable  Post vital signs: Reviewed and stable  Complications: No apparent anesthesia complications

## 2012-07-30 NOTE — H&P (View-Only) (Signed)
Reason for Consult:  Refer for possible open biopsy for patient with widespread LAD Referring Physician: Dr. Thompson  [name] [name] is an 67 y.o. male.  HPI:  67 yr old AA male with recent hospitalization for ACS s/p stent to RCA presented 2 weeks ago 12/8 and more recently became constipated and nauseated after returning home.  He eventually went to the [facility] after 1 week, he was given laxatives. Despite his symptoms he continued to try and eat, but he would developed pain and discomfort in his abdomen, which started 12/22. The pain radiates from belly to backbone. No fevers/chills, fatigue, dark stools.  No past abdominal surgeries, he still has his appendix and GB and never had an attack, no history of any hernias.    Back in August 2013 he saw UC for some swelling in his legs/groin/testicles.  He was given penicillin and lasix which resolved the issue.  At the time they noticed enlarged palpable lymphnodes.  Today he is able to point out that his lymph nodes in his neck are enlarged and mildly tender.    Workup revealed as of today a leukocytosis of 17.7, and lipase was 255 now down to 96 his LFTs were within normal limits.   Past Medical History  Diagnosis Date  . Ischemic cardiomyopathy   . Type II diabetes mellitus   . Hypertension   . Hypercholesterolemia   . Heart murmur   . OSA on CPAP   . Shortness of breath     "all the time" (06/06/2012)  . Lower GI bleeding 1980's    "when I was drinking alot" (06/06/2012)  . Arthritis     "both shoulders" (06/06/2012)  . Chronic combined systolic and diastolic CHF (congestive heart failure) 07/10/2012    Past Surgical History  Procedure Date  . Coronary angioplasty with stent placement 06/06/2012    "1"    Family History  Problem Relation Age of Onset  . Osteoarthritis Mother   . Osteoarthritis Father     Social History:  reports that he has quit smoking. His smoking use included Cigarettes. He has a 53 pack-year smoking  history. He does not have any smokeless tobacco history on file. 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  He denies any illicit drug use.  Allergies: No Known Allergies, but allergic to asbestos?  Medications: I have reviewed the patient's current medications.  Results for orders placed during the hospital encounter of 07/08/12 (from the past 48 hour(s))  URINALYSIS, MICROSCOPIC ONLY     Status: Abnormal   Collection Time   07/08/12  2:07 PM      Component Value Range Comment   Color, Urine YELLOW  YELLOW    APPearance CLEAR  CLEAR    Specific Gravity, Urine 1.010  1.005 - 1.030    pH 5.0  5.0 - 8.0    Glucose, UA NEGATIVE  NEGATIVE mg/dL    Hgb urine dipstick SMALL (*) NEGATIVE    Bilirubin Urine NEGATIVE  NEGATIVE    Ketones, ur NEGATIVE  NEGATIVE mg/dL    Protein, ur 30 (*) NEGATIVE mg/dL    Urobilinogen, UA 1.0  0.0 - 1.0 mg/dL    Nitrite NEGATIVE  NEGATIVE    Leukocytes, UA NEGATIVE  NEGATIVE    RBC / HPF 0-2  <3 RBC/hpf   GLUCOSE, CAPILLARY     Status: Abnormal   Collection Time   07/08/12    9:06 PM      Component Value Range Comment   Glucose-Capillary 140 (*) 70 - 99 mg/dL    Comment 1 Notify RN     COMPREHENSIVE METABOLIC PANEL     Status: Abnormal   Collection Time   07/09/12  4:35 AM      Component Value Range Comment   Sodium 132 (*) 135 - 145 mEq/L    Potassium 4.8  3.5 - 5.1 mEq/L    Chloride 96  96 - 112 mEq/L    CO2 21  19 - 32 mEq/L    Glucose, Bld 134 (*) 70 - 99 mg/dL    BUN 13  6 - 23 mg/dL    Creatinine, Ser 0.96  0.50 - 1.35 mg/dL    Calcium 9.7  8.4 - 10.5 mg/dL    Total Protein 6.9  6.0 - 8.3 g/dL    Albumin 3.8  3.5 - 5.2 g/dL    AST 21  0 - 37 U/L    ALT 21  0 - 53 U/L    Alkaline Phosphatase 86  39 - 117 U/L    Total Bilirubin 0.6  0.3 - 1.2 mg/dL    GFR calc non Af Amer 85 (*) >90 mL/min    GFR calc Af Amer >90  >90 mL/min   CBC     Status: Abnormal   Collection  Time   07/09/12  4:35 AM      Component Value Range Comment   WBC 17.5 (*) 4.0 - 10.5 K/uL    RBC 5.07  4.22 - 5.81 MIL/uL    Hemoglobin 14.7  13.0 - 17.0 g/dL    HCT 42.5  39.0 - 52.0 %    MCV 83.8  78.0 - 100.0 fL    MCH 29.0  26.0 - 34.0 pg    MCHC 34.6  30.0 - 36.0 g/dL    RDW 12.6  11.5 - 15.5 %    Platelets 202  150 - 400 K/uL   PROTIME-INR     Status: Normal   Collection Time   07/09/12  4:35 AM      Component Value Range Comment   Prothrombin Time 14.5  11.6 - 15.2 seconds    INR 1.15  0.00 - 1.49   GLUCOSE, CAPILLARY     Status: Abnormal   Collection Time   07/09/12  5:59 AM      Component Value Range Comment   Glucose-Capillary 140 (*) 70 - 99 mg/dL   LACTATE DEHYDROGENASE     Status: Normal   Collection Time   07/09/12  9:17 AM      Component Value Range Comment   LDH 244  94 - 250 U/L   LIPASE, BLOOD     Status: Abnormal   Collection Time   07/09/12  9:17 AM      Component Value Range Comment   Lipase 118 (*) 11 - 59 U/L   GLUCOSE, CAPILLARY     Status: Abnormal   Collection Time   07/09/12 11:33 AM      Component Value Range Comment   Glucose-Capillary 167 (*) 70 - 99 mg/dL    Comment 1 Notify RN     GLUCOSE, CAPILLARY     Status: Abnormal   Collection Time   07/09/12  4:34 PM      Component Value Range Comment   Glucose-Capillary 155 (*) 70 - 99 mg/dL    Comment 1 Notify RN       GLUCOSE, CAPILLARY     Status: Abnormal   Collection Time   07/09/12  8:15 PM      Component Value Range Comment   Glucose-Capillary 155 (*) 70 - 99 mg/dL   GLUCOSE, CAPILLARY     Status: Abnormal   Collection Time   07/10/12 12:04 AM      Component Value Range Comment   Glucose-Capillary 114 (*) 70 - 99 mg/dL   GLUCOSE, CAPILLARY     Status: Abnormal   Collection Time   07/10/12  4:12 AM      Component Value Range Comment   Glucose-Capillary 136 (*) 70 - 99 mg/dL   LIPASE, BLOOD     Status: Abnormal   Collection Time   07/10/12  6:10 AM      Component Value Range  Comment   Lipase 96 (*) 11 - 59 U/L   CBC     Status: Abnormal   Collection Time   07/10/12  6:10 AM      Component Value Range Comment   WBC 17.7 (*) 4.0 - 10.5 K/uL    RBC 5.19  4.22 - 5.81 MIL/uL    Hemoglobin 15.0  13.0 - 17.0 g/dL    HCT 43.4  39.0 - 52.0 %    MCV 83.6  78.0 - 100.0 fL    MCH 28.9  26.0 - 34.0 pg    MCHC 34.6  30.0 - 36.0 g/dL    RDW 12.6  11.5 - 15.5 %    Platelets 230  150 - 400 K/uL   COMPREHENSIVE METABOLIC PANEL     Status: Abnormal   Collection Time   07/10/12  6:10 AM      Component Value Range Comment   Sodium 134 (*) 135 - 145 mEq/L    Potassium 4.6  3.5 - 5.1 mEq/L    Chloride 95 (*) 96 - 112 mEq/L    CO2 25  19 - 32 mEq/L    Glucose, Bld 139 (*) 70 - 99 mg/dL    BUN 14  6 - 23 mg/dL    Creatinine, Ser 1.09  0.50 - 1.35 mg/dL    Calcium 9.8  8.4 - 10.5 mg/dL    Total Protein 7.4  6.0 - 8.3 g/dL    Albumin 4.3  3.5 - 5.2 g/dL    AST 21  0 - 37 U/L    ALT 22  0 - 53 U/L    Alkaline Phosphatase 92  39 - 117 U/L    Total Bilirubin 0.7  0.3 - 1.2 mg/dL    GFR calc non Af Amer 69 (*) >90 mL/min    GFR calc Af Amer 80 (*) >90 mL/min   GLUCOSE, CAPILLARY     Status: Abnormal   Collection Time   07/10/12  7:37 AM      Component Value Range Comment   Glucose-Capillary 125 (*) 70 - 99 mg/dL    Comment 1 Notify RN     GLUCOSE, CAPILLARY     Status: Abnormal   Collection Time   07/10/12 11:36 AM      Component Value Range Comment   Glucose-Capillary 110 (*) 70 - 99 mg/dL    Comment 1 Notify RN       Ct Chest W Contrast  07/09/2012  *RADIOLOGY REPORT*  Clinical Data: Lymphadenopathy.  CT CHEST WITH CONTRAST  Technique:  Multidetector CT imaging of the chest was performed following the standard protocol during bolus administration of intravenous contrast.    Contrast: 75mL OMNIPAQUE IOHEXOL 300 MG/ML  SOLN  Comparison: Abdominal CT scan 07/08/2012.  Findings: The chest wall demonstrates bulky supraclavicular, subpectoral and axillary lymphadenopathy.   The thyroid gland is grossly normal.  The bony thorax is intact.  No destructive bone lesions or spinal canal compromise.  There are advanced degenerative changes involving the shoulder joints and there are moderate degenerative changes involving the thoracic spine.  The heart is normal in size.  No pericardial effusion.  The aorta is normal in caliber.  Mild atherosclerotic calcifications. Coronary artery calcifications are noted.  There is bulky mediastinal and hilar lymphadenopathy. The esophagus is grossly normal.  Examination of the lung parenchyma demonstrates no acute pulmonary findings.  No worrisome pulmonary mass lesions or nodules.  No pleural effusion.  The upper abdomen demonstrates bulky adenopathy.  IMPRESSION:  1.  Bulky supraclavicular, subpectoral, axillary, mediastinal and hilar lymphadenopathy. 2.  No acute pulmonary findings or worrisome pulmonary nodules.   Original Report Authenticated By: P. Gallerani, M.D.    Ct Abdomen Pelvis W Contrast  07/08/2012  *RADIOLOGY REPORT*  Clinical Data: Severe abdominal pain  CT ABDOMEN AND PELVIS WITH CONTRAST  Technique:  Multidetector CT imaging of the abdomen and pelvis was performed following the standard protocol during bolus administration of intravenous contrast.  Contrast:  100 ml Omnipaque-300  Comparison: Abdomen films of 07/06/2012  Findings: The lung bases are clear.  The liver enhances with no focal abnormality and no ductal dilatation is seen.  No calcified gallstones are seen.  The pancreas enhances with no focal abnormality.  However, there is significant adenopathy surrounding the head of the pancreas extending to the retroperitoneum and the mesentery.  One of the largest clusters of nodes is just caudal to the head of the pancreas measuring 7.9 x 3.9 cm.  Multiple nodes extend more caudally into the lower abdomen and upper pelvis. These nodes appear homogeneous, and lymphoma would be the primary consideration. There is a probable small  right incidental adrenal adenoma present in the left adrenal gland is unremarkable.  The spleen is within normal limits in size.  The stomach is not well distended.  There is indentation upon the distal antrum of the stomach and the duodenum however by clusters of adenopathy.  The kidneys enhance and there is renovascular calcification present. No definite renal calculi are seen and on delayed images, no hydronephrosis is noted. A fatty attenuation area in the posterior right kidney may represent a small angiomyolipoma.  The abdominal aorta is normal in caliber.  The adenopathy extends into the upper abdomen.  Within the upper pelvis at approximately the level of the iliac crest there are several pathologically enlarged lymph nodes with short-axis diameter over 2 cm.  The urinary bladder is not well distended but is unremarkable.  The prostate is minimally prominent.  There are multiple rectosigmoid colonic diverticula present.  The appendix and terminal ileum are unremarkable.  There are small lymph nodes along the iliac chains bilaterally.  Diffuse degenerative changes noted throughout the lumbar spine.  IMPRESSION:  1.  Adenopathy surrounding the pancreas, and involving the retroperitoneum and mesentery throughout the abdomen and extending into the upper pelvis.  This adenopathy is very homogeneous and most typical of lymphoma. 2.  Multiple rectosigmoid and distal descending colon diverticula. 3.  The appendix and terminal ileum are unremarkable.   Original Report Authenticated By: Paul Barry, M.D.     ROS Blood pressure 119/76, pulse 92, temperature 98.5 F (36.9 C), temperature source Oral, resp. rate   18, height 6' 3" (1.905 m), weight 264 lb 6.4 oz (119.931 kg), SpO2 96.00%.  Physical Exam General: pleasant, WD/WN AA male, NAD HEENT: head is normocephalic, atraumatic.  Sclera are noninjected.  PERRL.  Ears and nose without any masses or lesions.  Mouth is pink and moist Neck:  +LAD anterior/posterior  cervical, supraclavicular, and axillary Heart: regular, rate, and rhythm. +murmur Lungs: CTAB, no wheezes, rhonchi, or rales noted.  Respiratory effort nonlabored Abd: Soft, NT/ND, diminished BS, no masses, hernias, or organomegaly Ext:  Mild b/l leg edema, good pulses Psych: A&Ox3 with an appropriate affect.   Assessment/Plan: 1.  Pancreatitis - cont NPO, per medicine treatments 2.  Widespread lymphadenopathy, leukocytosis (17.7) possible lymphoma   - Will need biopsied, oncologist Dr. Shadad recommends surgical biopsy as opposed to IR guided biopsy  - IR per note would be willing to biopsy depending on what surgery recommends  - Patient aware that he may have lymphoma and understands we need to get biopsy results back for confirmation  - Patient on day 2 of holding Effient will need to hold 7-10 days before IR or surgery will consider an operation/procedure  - Long discussion with patient about him needing to stay her until his pancreatitis is resolved and medicine/cardiology think its   safe for him to return home.  He may be able to be discharged and brought back if he improves over the next day or 2.  - One factor limiting the patients biopsy is his CAD and heard disease as well as his limited flexion in b/l shoulders due to frozen  Shoulders if approach would be axillary lymph nodes.    - Will discuss with Dr. Streck about potential for biopsy and time frame.     DORT, Kristianne Albin 07/10/2012, 1:48 PM      

## 2012-07-30 NOTE — Interval H&P Note (Signed)
History and Physical Interval Note:  07/30/2012 11:43 AM  [name] [name]  has presented today for surgery, with the diagnosis of lymphoma  The various methods of treatment have been discussed with the patient and family. After consideration of risks, benefits and other options for treatment, the patient has consented to  Procedure(s) (LRB) with comments: INSERTION PORT-A-CATH (N/A) as a surgical intervention .  The patient's history has been reviewed, patient examined, no change in status, stable for surgery.  I have reviewed the patient's chart and labs.  Questions were answered to the patient's satisfaction.   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 He has done well since discharge and is planning on chemo to start in a few days. He presents for port placement. I have reviewed the procedure risks etc and he is ready to proceed. I discussed with him the need to have someone stay with him tonight or stay in the [facility] for overnight observation  [name] [name]

## 2012-07-31 ENCOUNTER — Encounter (HOSPITAL_BASED_OUTPATIENT_CLINIC_OR_DEPARTMENT_OTHER): Payer: Self-pay | Admitting: Surgery

## 2012-07-31 LAB — POCT I-STAT, CHEM 8
Calcium, Ion: 1.16 mmol/L (ref 1.13–1.30)
Creatinine, Ser: 1 mg/dL (ref 0.50–1.35)
Glucose, Bld: 150 mg/dL — ABNORMAL HIGH (ref 70–99)
HCT: 40 % (ref 39.0–52.0)
Hemoglobin: 13.6 g/dL (ref 13.0–17.0)
Potassium: 4.7 mEq/L (ref 3.5–5.1)
TCO2: 26 mmol/L (ref 0–100)

## 2012-08-01 ENCOUNTER — Ambulatory Visit (HOSPITAL_BASED_OUTPATIENT_CLINIC_OR_DEPARTMENT_OTHER): Payer: Medicare Other

## 2012-08-01 ENCOUNTER — Other Ambulatory Visit (HOSPITAL_BASED_OUTPATIENT_CLINIC_OR_DEPARTMENT_OTHER): Payer: Medicare Other | Admitting: Lab

## 2012-08-01 VITALS — BP 134/63 | HR 89 | Temp 97.9°F | Resp 17

## 2012-08-01 DIAGNOSIS — Z5111 Encounter for antineoplastic chemotherapy: Secondary | ICD-10-CM

## 2012-08-01 DIAGNOSIS — Z5112 Encounter for antineoplastic immunotherapy: Secondary | ICD-10-CM

## 2012-08-01 DIAGNOSIS — C8589 Other specified types of non-Hodgkin lymphoma, extranodal and solid organ sites: Secondary | ICD-10-CM

## 2012-08-01 DIAGNOSIS — I10 Essential (primary) hypertension: Secondary | ICD-10-CM

## 2012-08-01 DIAGNOSIS — C8590 Non-Hodgkin lymphoma, unspecified, unspecified site: Secondary | ICD-10-CM

## 2012-08-01 DIAGNOSIS — E119 Type 2 diabetes mellitus without complications: Secondary | ICD-10-CM

## 2012-08-01 LAB — CBC WITH DIFFERENTIAL/PLATELET
BASO%: 0.4 % (ref 0.0–2.0)
Basophils Absolute: 0.1 10*3/uL (ref 0.0–0.1)
EOS%: 6.8 % (ref 0.0–7.0)
HGB: 13.6 g/dL (ref 13.0–17.1)
MCH: 28.6 pg (ref 27.2–33.4)
MCHC: 33.6 g/dL (ref 32.0–36.0)
MCV: 85.1 fL (ref 79.3–98.0)
MONO%: 3.4 % (ref 0.0–14.0)
RBC: 4.76 10*6/uL (ref 4.20–5.82)
RDW: 12.7 % (ref 11.0–14.6)
lymph#: 7.8 10*3/uL — ABNORMAL HIGH (ref 0.9–3.3)

## 2012-08-01 LAB — COMPREHENSIVE METABOLIC PANEL (CC13)
AST: 17 U/L (ref 5–34)
Alkaline Phosphatase: 83 U/L (ref 40–150)
Glucose: 147 mg/dl — ABNORMAL HIGH (ref 70–99)
Potassium: 4.4 mEq/L (ref 3.5–5.1)
Sodium: 138 mEq/L (ref 136–145)
Total Bilirubin: 0.69 mg/dL (ref 0.20–1.20)
Total Protein: 6.9 g/dL (ref 6.4–8.3)

## 2012-08-01 MED ORDER — HEPARIN SOD (PORK) LOCK FLUSH 100 UNIT/ML IV SOLN
500.0000 [IU] | Freq: Once | INTRAVENOUS | Status: AC | PRN
  Administered 2012-08-01: 500 [IU]
  Filled 2012-08-01: qty 5

## 2012-08-01 MED ORDER — DEXAMETHASONE SODIUM PHOSPHATE 10 MG/ML IJ SOLN
10.0000 mg | Freq: Once | INTRAMUSCULAR | Status: AC
  Administered 2012-08-01: 10 mg via INTRAVENOUS

## 2012-08-01 MED ORDER — DIPHENHYDRAMINE HCL 25 MG PO CAPS
50.0000 mg | ORAL_CAPSULE | Freq: Once | ORAL | Status: AC
  Administered 2012-08-01: 50 mg via ORAL

## 2012-08-01 MED ORDER — SODIUM CHLORIDE 0.9 % IV SOLN
90.0000 mg/m2 | Freq: Once | INTRAVENOUS | Status: AC
  Administered 2012-08-01: 225 mg via INTRAVENOUS
  Filled 2012-08-01: qty 45

## 2012-08-01 MED ORDER — SODIUM CHLORIDE 0.9 % IV SOLN
375.0000 mg/m2 | Freq: Once | INTRAVENOUS | Status: AC
  Administered 2012-08-01: 900 mg via INTRAVENOUS
  Filled 2012-08-01: qty 90

## 2012-08-01 MED ORDER — SODIUM CHLORIDE 0.9 % IV SOLN
Freq: Once | INTRAVENOUS | Status: AC
  Administered 2012-08-01: 09:00:00 via INTRAVENOUS

## 2012-08-01 MED ORDER — SODIUM CHLORIDE 0.9 % IJ SOLN
10.0000 mL | INTRAMUSCULAR | Status: DC | PRN
  Administered 2012-08-01: 10 mL
  Filled 2012-08-01: qty 10

## 2012-08-01 MED ORDER — ACETAMINOPHEN 325 MG PO TABS
650.0000 mg | ORAL_TABLET | Freq: Once | ORAL | Status: AC
  Administered 2012-08-01: 650 mg via ORAL

## 2012-08-01 MED ORDER — ONDANSETRON 8 MG/50ML IVPB (CHCC)
8.0000 mg | Freq: Once | INTRAVENOUS | Status: AC
  Administered 2012-08-01: 8 mg via INTRAVENOUS

## 2012-08-01 NOTE — Progress Notes (Signed)
0935 Rituxan initiated at 73ml/hr x 9ml VTBI. 1005 VSS. Patient with no complaints, rate increased to 66ml/hr x 19 VTBI. 1035 VSS. Patient with no complaints, eating. Rate increased to 64ml/hr x 29ml VTBI. 1105 VSS. Patient with no complaints. Rate increased to 42ml/hr x 38ml VTBI. 1135 VSS. Patient with no complaints. Rate increased to 102ml/hr x 48ml VTBI 1205 VSS. Patient with no complaints. Up to bathroom. Rate increased to 159ml/hr x 58ml VTBI 1235 VSS. Patient with no complaints. Rate increased to 122ml/hr x 67ml VTBI. 1305 VSS. Patient with no complaints. Rate increased to goal rate of 180ml/hr.

## 2012-08-01 NOTE — Patient Instructions (Addendum)
[facility] Discharge Instructions for Patients Receiving Chemotherapy  Today you received the following chemotherapy agents Rituxan and Treanda.  To help prevent nausea and vomiting after your treatment, we encourage you to take your nausea medication. Begin taking your nausea medication as often as prescribed for by Dr. Clelia Croft.    If you develop nausea and vomiting that is not controlled by your nausea medication, call the clinic. If it is after clinic hours your family physician or the after hours number for the clinic or go to the Emergency Department.   BELOW ARE SYMPTOMS THAT SHOULD BE REPORTED IMMEDIATELY:  *FEVER GREATER THAN 100.5 F  *CHILLS WITH OR WITHOUT FEVER  NAUSEA AND VOMITING THAT IS NOT CONTROLLED WITH YOUR NAUSEA MEDICATION  *UNUSUAL SHORTNESS OF BREATH  *UNUSUAL BRUISING OR BLEEDING  TENDERNESS IN MOUTH AND THROAT WITH OR WITHOUT PRESENCE OF ULCERS  *URINARY PROBLEMS  *BOWEL PROBLEMS  UNUSUAL RASH Items with * indicate a potential emergency and should be followed up as soon as possible.  One of the nurses will contact you 24 hours after your treatment. Please let the nurse know about any problems that you may have experienced. Feel free to call the clinic you have any questions or concerns. The clinic phone number is [phone].   I have been informed and understand all the instructions given to me. I know to contact the clinic, my physician, or go to the Emergency Department if any problems should occur. I do not have any questions at this time, but understand that I may call the clinic during office hours   should I have any questions or need assistance in obtaining follow up care.    __________________________________________  _____________  __________ Signature of Patient or Authorized Representative            Date                   Time    __________________________________________ Nurse's Signature

## 2012-08-02 ENCOUNTER — Telehealth: Payer: Self-pay | Admitting: *Deleted

## 2012-08-02 ENCOUNTER — Ambulatory Visit (HOSPITAL_BASED_OUTPATIENT_CLINIC_OR_DEPARTMENT_OTHER): Payer: Medicare Other

## 2012-08-02 VITALS — BP 146/69 | HR 91 | Temp 97.0°F | Resp 24

## 2012-08-02 DIAGNOSIS — C8590 Non-Hodgkin lymphoma, unspecified, unspecified site: Secondary | ICD-10-CM

## 2012-08-02 DIAGNOSIS — Z5111 Encounter for antineoplastic chemotherapy: Secondary | ICD-10-CM

## 2012-08-02 DIAGNOSIS — C8589 Other specified types of non-Hodgkin lymphoma, extranodal and solid organ sites: Secondary | ICD-10-CM

## 2012-08-02 MED ORDER — SODIUM CHLORIDE 0.9 % IV SOLN
90.0000 mg/m2 | Freq: Once | INTRAVENOUS | Status: AC
  Administered 2012-08-02: 225 mg via INTRAVENOUS
  Filled 2012-08-02: qty 45

## 2012-08-02 MED ORDER — SODIUM CHLORIDE 0.9 % IV SOLN
Freq: Once | INTRAVENOUS | Status: AC
  Administered 2012-08-02: 15:00:00 via INTRAVENOUS

## 2012-08-02 MED ORDER — SODIUM CHLORIDE 0.9 % IJ SOLN
10.0000 mL | INTRAMUSCULAR | Status: DC | PRN
  Administered 2012-08-02: 10 mL
  Filled 2012-08-02: qty 10

## 2012-08-02 MED ORDER — DEXAMETHASONE SODIUM PHOSPHATE 10 MG/ML IJ SOLN
10.0000 mg | Freq: Once | INTRAMUSCULAR | Status: AC
  Administered 2012-08-02: 10 mg via INTRAVENOUS

## 2012-08-02 MED ORDER — HEPARIN SOD (PORK) LOCK FLUSH 100 UNIT/ML IV SOLN
500.0000 [IU] | Freq: Once | INTRAVENOUS | Status: AC | PRN
  Administered 2012-08-02: 500 [IU]
  Filled 2012-08-02: qty 5

## 2012-08-02 MED ORDER — ONDANSETRON 8 MG/50ML IVPB (CHCC)
8.0000 mg | Freq: Once | INTRAVENOUS | Status: AC
  Administered 2012-08-02: 8 mg via INTRAVENOUS

## 2012-08-02 NOTE — Patient Instructions (Addendum)
[facility] Discharge Instructions for Patients Receiving Chemotherapy  Today you received the following chemotherapy agents treanda  To help prevent nausea and vomiting after your treatment, we encourage you to take your nausea medication  as often as prescribed for the next 48-72 hours.   If you develop nausea and vomiting that is not controlled by your nausea medication, call the clinic. If it is after clinic hours your family physician or the after hours number for the clinic or go to the Emergency Department.   BELOW ARE SYMPTOMS THAT SHOULD BE REPORTED IMMEDIATELY:  *FEVER GREATER THAN 100.5 F  *CHILLS WITH OR WITHOUT FEVER  NAUSEA AND VOMITING THAT IS NOT CONTROLLED WITH YOUR NAUSEA MEDICATION  *UNUSUAL SHORTNESS OF BREATH  *UNUSUAL BRUISING OR BLEEDING  TENDERNESS IN MOUTH AND THROAT WITH OR WITHOUT PRESENCE OF ULCERS  *URINARY PROBLEMS  *BOWEL PROBLEMS  UNUSUAL RASH Items with * indicate a potential emergency and should be followed up as soon as possible.  Feel free to call the clinic you have any questions or concerns. The clinic phone number is [phone].   I have been informed and understand all the instructions given to me. I know to contact the clinic, my physician, or go to the Emergency Department if any problems should occur. I do not have any questions at this time, but understand that I may call the clinic during office hours   should I have any questions or need assistance in obtaining follow up care.    __________________________________________  _____________  __________ Signature of Patient or Authorized Representative            Date                   Time    __________________________________________ Nurse's Signature

## 2012-08-02 NOTE — Telephone Encounter (Signed)
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 Appointments for previsit and colonoscopy were scheduled in 06/2012. Message left for patient to confirm if this appointment needs to be kept for tomorrow and end of month or if we need to cancel and reschedule.

## 2012-08-03 ENCOUNTER — Encounter: Payer: Self-pay | Admitting: Internal Medicine

## 2012-08-03 ENCOUNTER — Telehealth: Payer: Self-pay | Admitting: *Deleted

## 2012-08-03 ENCOUNTER — Encounter: Payer: Self-pay | Admitting: *Deleted

## 2012-08-03 NOTE — Telephone Encounter (Signed)
Called patient for chemo follow up, patient states he called and spoke to Dr Clelia Croft last night regarding SOB he was experiencing, used the "machine I have used for years" and after an hour it got better so he did not need to go to emergency room. Patient denies any symptoms today of nausea/vomiting/diarrhea/fever. Patient understands to call with any other problems.

## 2012-08-03 NOTE — Telephone Encounter (Signed)
Message copied by Kathlynn Grate on Fri Aug 03, 2012  4:09 PM ------      Message from: Rexene Edison      Created: Wed Aug 01, 2012  3:41 PM      Regarding: chemo follow up call      Contact: [phone]       Rituxan/ Treanda 1/15 and 1/16. Dr. Clelia Croft.

## 2012-08-03 NOTE — Progress Notes (Signed)
Patient ID: [name] [name], male   DOB: Sep 22, 1945, 67 y.o.   MRN: [id number]  Pt here for PV for direct colonoscopy 1/31.  Ph has new diagnosis of Lymphoma and began chemo on 1/16.  Pt said Dr. Artis Flock suggested screening colonoscopy before diagnosis.  Explained to pt that he would need ov with Dr. Leone Payor to discuss need for colonoscopy.  PV and colonoscopy cancelled and new patient appt scheduled with Dr. Leone Payor. 2/10 at 10:30.

## 2012-08-17 ENCOUNTER — Encounter: Payer: Medicare Other | Admitting: Internal Medicine

## 2012-08-27 ENCOUNTER — Ambulatory Visit (INDEPENDENT_AMBULATORY_CARE_PROVIDER_SITE_OTHER): Payer: Medicare Other | Admitting: Internal Medicine

## 2012-08-27 ENCOUNTER — Encounter: Payer: Self-pay | Admitting: Internal Medicine

## 2012-08-27 VITALS — BP 100/64 | HR 96 | Ht 72.0 in | Wt 266.1 lb

## 2012-08-27 DIAGNOSIS — Z1211 Encounter for screening for malignant neoplasm of colon: Secondary | ICD-10-CM

## 2012-08-27 DIAGNOSIS — K5900 Constipation, unspecified: Secondary | ICD-10-CM

## 2012-08-27 NOTE — Progress Notes (Signed)
  Subjective:    Patient ID: [name] [name], male    DOB: [birth year], 67 y.o.   MRN: [id number]  HPI This very nice man is here to discuss a screening colonoscopy. He recently had some constipation and saw Dr. Artis Flock - his former PCP - in urgent care. Dr. Artis Flock advised he see about a colonoscopy. He was also recently diagnosed with lymphoma and was hospitalized at the time, with an associated pancreatitis. He has had a Portocath placed and has started or will start chemotherapy under the direction of Dr. Clelia Croft soon.  Medications, allergies, past medical history, past surgical history, family history and social history are reviewed and updated in the EMR.   Review of Systems + arthritis, myalgias, fatigue All other ROS negative or as above    Objective:   Physical Exam General:  Well-developed, well-nourished and in no acute distress Eyes:  anicteric. ENT:   Mouth and posterior pharynx free of lesions.  Neck:   supple w/o thyromegaly or mass.  Lungs: Clear to auscultation bilaterally. Heart:  S1S2, 3/6 systolic ejection murmur best at RUSB no gallops. Abdomen:  soft, non-tender, no hepatosplenomegaly, hernia, or mass and BS+.  Rectal: declined Lymph:  no cervical or supraclavicular adenopathy. Extremities:   Trace bilateral LE edema Skin   no rash. Neuro:  A&O x 3.  Psych:  appropriate mood and  Affect.   Data Reviewed: Lab Results  Component Value Date   WBC 14.9* 08/01/2012   HGB 13.6 08/01/2012   HCT 40.5 08/01/2012   MCV 85.1 08/01/2012   PLT 187 08/01/2012          Assessment & Plan:   1. Unspecified constipation - transient  2. Special screening for malignant neoplasms, colon    1. 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 Depending upon how that goes it may not be appropriate at all - but I suspect it will. 2. He has asked to obtain a PCP and he will ask [name] for an appointment.  Cc: Eli Hose, MD

## 2012-08-27 NOTE — Patient Instructions (Addendum)
Stop at primary care on the first floor about establishing care.  Stop your Protonix but if indigestion becomes bad may restart.  Get your potassium filled.  We have put you in for an office recall for August 2014 and will contact you at that time.   Thank you for choosing me and [facility].  Iva Boop, M.D., [facility]

## 2012-08-28 ENCOUNTER — Telehealth: Payer: Self-pay | Admitting: *Deleted

## 2012-08-28 NOTE — Telephone Encounter (Signed)
Patient called regarding her appts for tomorrow, patient needs to cancel due to possible weather. Patient transferred to the desk RN. JMW

## 2012-08-29 ENCOUNTER — Telehealth: Payer: Self-pay | Admitting: Oncology

## 2012-08-29 ENCOUNTER — Other Ambulatory Visit (HOSPITAL_BASED_OUTPATIENT_CLINIC_OR_DEPARTMENT_OTHER): Payer: Medicare Other

## 2012-08-29 ENCOUNTER — Ambulatory Visit (HOSPITAL_BASED_OUTPATIENT_CLINIC_OR_DEPARTMENT_OTHER): Payer: Medicare Other

## 2012-08-29 ENCOUNTER — Ambulatory Visit (HOSPITAL_BASED_OUTPATIENT_CLINIC_OR_DEPARTMENT_OTHER): Payer: Medicare Other | Admitting: Oncology

## 2012-08-29 VITALS — BP 135/83 | HR 86 | Temp 98.3°F | Resp 16

## 2012-08-29 VITALS — BP 143/79 | HR 93 | Temp 97.3°F | Resp 20 | Ht 72.0 in | Wt 261.7 lb

## 2012-08-29 DIAGNOSIS — Z5112 Encounter for antineoplastic immunotherapy: Secondary | ICD-10-CM

## 2012-08-29 DIAGNOSIS — I10 Essential (primary) hypertension: Secondary | ICD-10-CM

## 2012-08-29 DIAGNOSIS — C8590 Non-Hodgkin lymphoma, unspecified, unspecified site: Secondary | ICD-10-CM

## 2012-08-29 DIAGNOSIS — C8589 Other specified types of non-Hodgkin lymphoma, extranodal and solid organ sites: Secondary | ICD-10-CM

## 2012-08-29 DIAGNOSIS — E119 Type 2 diabetes mellitus without complications: Secondary | ICD-10-CM

## 2012-08-29 LAB — CBC WITH DIFFERENTIAL/PLATELET
Eosinophils Absolute: 1.3 10*3/uL — ABNORMAL HIGH (ref 0.0–0.5)
HCT: 36.3 % — ABNORMAL LOW (ref 38.4–49.9)
LYMPH%: 17.8 % (ref 14.0–49.0)
MCV: 84 fL (ref 79.3–98.0)
MONO#: 0.7 10*3/uL (ref 0.1–0.9)
MONO%: 6.4 % (ref 0.0–14.0)
NEUT#: 6.8 10*3/uL — ABNORMAL HIGH (ref 1.5–6.5)
NEUT%: 63.1 % (ref 39.0–75.0)
Platelets: 321 10*3/uL (ref 140–400)
WBC: 10.8 10*3/uL — ABNORMAL HIGH (ref 4.0–10.3)

## 2012-08-29 LAB — COMPREHENSIVE METABOLIC PANEL (CC13)
BUN: 13.4 mg/dL (ref 7.0–26.0)
CO2: 26 mEq/L (ref 22–29)
Calcium: 9.5 mg/dL (ref 8.4–10.4)
Chloride: 101 mEq/L (ref 98–107)
Creatinine: 1.2 mg/dL (ref 0.7–1.3)
Glucose: 203 mg/dl — ABNORMAL HIGH (ref 70–99)

## 2012-08-29 MED ORDER — DEXAMETHASONE SODIUM PHOSPHATE 10 MG/ML IJ SOLN
10.0000 mg | Freq: Once | INTRAMUSCULAR | Status: AC
  Administered 2012-08-29: 10 mg via INTRAVENOUS

## 2012-08-29 MED ORDER — DIPHENHYDRAMINE HCL 25 MG PO CAPS
50.0000 mg | ORAL_CAPSULE | Freq: Once | ORAL | Status: AC
  Administered 2012-08-29: 50 mg via ORAL

## 2012-08-29 MED ORDER — SODIUM CHLORIDE 0.9 % IV SOLN
375.0000 mg/m2 | Freq: Once | INTRAVENOUS | Status: AC
  Administered 2012-08-29: 900 mg via INTRAVENOUS
  Filled 2012-08-29: qty 90

## 2012-08-29 MED ORDER — SODIUM CHLORIDE 0.9 % IV SOLN
Freq: Once | INTRAVENOUS | Status: AC
  Administered 2012-08-29: 09:00:00 via INTRAVENOUS

## 2012-08-29 MED ORDER — ACETAMINOPHEN 325 MG PO TABS
650.0000 mg | ORAL_TABLET | Freq: Once | ORAL | Status: AC
  Administered 2012-08-29: 650 mg via ORAL

## 2012-08-29 MED ORDER — BENDAMUSTINE HCL (LYOPHILIZED PWD) CHEMO INJECTION 100MG
90.0000 mg/m2 | Freq: Once | INTRAVENOUS | Status: AC
  Administered 2012-08-29: 225 mg via INTRAVENOUS
  Filled 2012-08-29: qty 45

## 2012-08-29 MED ORDER — ONDANSETRON 8 MG/50ML IVPB (CHCC)
8.0000 mg | Freq: Once | INTRAVENOUS | Status: AC
  Administered 2012-08-29: 8 mg via INTRAVENOUS

## 2012-08-29 MED ORDER — SODIUM CHLORIDE 0.9 % IJ SOLN
10.0000 mL | INTRAMUSCULAR | Status: DC | PRN
  Administered 2012-08-29: 10 mL
  Filled 2012-08-29: qty 10

## 2012-08-29 MED ORDER — HEPARIN SOD (PORK) LOCK FLUSH 100 UNIT/ML IV SOLN
500.0000 [IU] | Freq: Once | INTRAVENOUS | Status: AC | PRN
  Administered 2012-08-29: 500 [IU]
  Filled 2012-08-29: qty 5

## 2012-08-29 NOTE — Telephone Encounter (Signed)
Gave pt appt for lab and MD for [facility] and April 2014, emailed Marcelino Duster regzarding chemo she will get calendar from [facility] @ chemo room

## 2012-08-29 NOTE — Progress Notes (Signed)
Hematology and Oncology Follow Up Visit  [name] [name] [id number] 04/13/1946 67 y.o. 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   Prior Therapy: He is S/P lymph node biopsy that was done on 07/13/2012.   Current therapy: He is S/P the first cycle of B-R in 07/2012. He is here for cycle 2.   Interim History: [name] [name] presents today for a follow up visit. He is a nice man with the above diagnosis. He presented with bulky adenopathy above and below the diaphragm. He tolerated chemotherapy so far without complications. He did have an episode of congestion and shortness of breath after the first cycle, but resolved quickly. He reports no fevers or chills. No nausea or vomiting. His abdominal  pain has resolved. He is able to eat without any problems. He is not reporting any constipation, nausea, diarrhea, or abdominal pain. He  resumed most activities of daily living without any hindrance or decline. He has been driving as well.   Medications: I have reviewed the patient's current medications. Current outpatient prescriptions:Alum & Mag Hydroxide-Simeth (GI COCKTAIL) SUSP suspension, Take 30 mLs by mouth 3 (three) times daily as needed for indigestion. Shake well., Disp: 300 mL, Rfl: 0;  aspirin EC 81 MG EC tablet, Take 1 tablet (81 mg total) by mouth daily., Disp: , Rfl: ;  atorvastatin (LIPITOR) 20 MG tablet, Take 1 tablet (20 mg total) by mouth daily at 6 PM., Disp: 30 tablet, Rfl: 5 carvedilol (COREG CR) 10 MG 24 hr capsule, Take 325 mg by mouth 2 (two) times daily. , Disp: , Rfl: ;  furosemide (LASIX) 20 MG tablet, Take 20 mg by mouth daily., Disp: , Rfl: ;  lisinopril (PRINIVIL,ZESTRIL) 5 MG tablet, Take 1 tablet (5 mg total) by mouth daily., Disp: 30 tablet, Rfl: 5;  metFORMIN (GLUCOPHAGE) 500 MG tablet, Take 1,000 mg by mouth 2 (two) times daily with a meal., Disp: , Rfl:  oxymetazoline (AFRIN) 0.05 % nasal spray, Place 2 sprays into the  nose 2 (two) times daily as needed. For nasal congestion, Disp: , Rfl: ;  potassium chloride (K-DUR) 10 MEQ tablet, Take 10 mg by mouth daily. , Disp: , Rfl:   Allergies: No Known Allergies  Past Medical History, Surgical history, Social history, and Family History were reviewed and updated.  Review of Systems: Constitutional:  Negative for fever, chills, night sweats, anorexia, weight loss, pain. Cardiovascular: no chest pain or dyspnea on exertion Respiratory: negative Neurological: negative Dermatological: negative ENT: negative Skin: Negative. Gastrointestinal: negative Genito-Urinary: negative Hematological and Lymphatic: negative Breast: negative Musculoskeletal: negative Remaining ROS negative. Physical Exam: Blood pressure 143/79, pulse 93, temperature 97.3 F (36.3 C), temperature source Oral, resp. rate 20, height 6' (1.829 m), weight 261 lb 11.2 oz (118.706 kg). ECOG: 1 General appearance: alert Head: Normocephalic, without obvious abnormality, atraumatic Neck: no adenopathy, no carotid bruit, no JVD, supple, symmetrical, trachea midline and thyroid not enlarged, symmetric, no tenderness/mass/nodules Lymph nodes: Cervical, supraclavicular, and axillary nodes normal. Heart:regular rate and rhythm, S1, S2 normal, no murmur, click, rub or gallop Lung:chest clear, no wheezing, rales, normal symmetric air entry Abdomin: soft, non-tender, without masses or organomegaly EXT:no erythema, induration, or nodules Decrease to a resolution of his left cervical adenopathy.   Lab Results: Lab Results  Component Value Date   WBC 10.8* 08/29/2012   HGB 12.2* 08/29/2012   HCT 36.3* 08/29/2012   MCV 84.0 08/29/2012   PLT 321 08/29/2012  Chemistry      Component Value Date/Time   NA 138 08/01/2012 0815   NA 139 07/30/2012 1140   K 4.4 08/01/2012 0815   K 4.7 07/30/2012 1140   CL 102 08/01/2012 0815   CL 102 07/30/2012 1140   CO2 26 08/01/2012 0815   CO2 24 07/14/2012 0432   BUN  14.0 08/01/2012 0815   BUN 16 07/30/2012 1140   CREATININE 1.4* 08/01/2012 0815   CREATININE 1.00 07/30/2012 1140      Component Value Date/Time   CALCIUM 9.5 08/01/2012 0815   CALCIUM 9.3 07/14/2012 0432   ALKPHOS 83 08/01/2012 0815   ALKPHOS 84 07/11/2012 0435   AST 17 08/01/2012 0815   AST 22 07/11/2012 0435   ALT 11 08/01/2012 0815   ALT 20 07/11/2012 0435   BILITOT 0.69 08/01/2012 0815   BILITOT 0.6 07/11/2012 0435       Impression and Plan:  This is a 67 year old gentleman with the following issues: 1. 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 He is on B-R chemotherapy. He is tolerating it well without complications. He is ready to proceed with cycle 2 today. Likely he will need 4-6 cycles.   2. Nausea prophylaxis. on Zofran without major issues.   3. IV access: A port in place without issues at this time.   4. Follow up: 3/12 for cycle 3.       [facility], MD 2/12/20148:48 AM

## 2012-08-29 NOTE — Patient Instructions (Addendum)
[facility] Discharge Instructions for Patients Receiving Chemotherapy  Today you received the following chemotherapy agents Rituxan/Treanda  To help prevent nausea and vomiting after your treatment, we encourage you to take your nausea medication as prescribed.   If you develop nausea and vomiting that is not controlled by your nausea medication, call the clinic. If it is after clinic hours your family physician or the after hours number for the clinic or go to the Emergency Department.   BELOW ARE SYMPTOMS THAT SHOULD BE REPORTED IMMEDIATELY:  *FEVER GREATER THAN 100.5 F  *CHILLS WITH OR WITHOUT FEVER  NAUSEA AND VOMITING THAT IS NOT CONTROLLED WITH YOUR NAUSEA MEDICATION  *UNUSUAL SHORTNESS OF BREATH  *UNUSUAL BRUISING OR BLEEDING  TENDERNESS IN MOUTH AND THROAT WITH OR WITHOUT PRESENCE OF ULCERS  *URINARY PROBLEMS  *BOWEL PROBLEMS  UNUSUAL RASH Items with * indicate a potential emergency and should be followed up as soon as possible.  Feel free to call the clinic you have any questions or concerns. The clinic phone number is [phone].   I have been informed and understand all the instructions given to me. I know to contact the clinic, my physician, or go to the Emergency Department if any problems should occur. I do not have any questions at this time, but understand that I may call the clinic during office hours   should I have any questions or need assistance in obtaining follow up care.    __________________________________________  _____________  __________ Signature of Patient or Authorized Representative            Date                   Time    __________________________________________ Nurse's Signature

## 2012-08-30 ENCOUNTER — Ambulatory Visit: Payer: Medicare Other

## 2012-09-06 ENCOUNTER — Telehealth: Payer: Self-pay | Admitting: *Deleted

## 2012-09-06 NOTE — Telephone Encounter (Signed)
Per staff message and POF I have scheduled appts.  JMW  

## 2012-09-07 ENCOUNTER — Telehealth: Payer: Self-pay | Admitting: Oncology

## 2012-09-07 NOTE — Telephone Encounter (Signed)
Talked to patient and gave him appt date for lab,ML and chemo

## 2012-09-26 ENCOUNTER — Ambulatory Visit (HOSPITAL_BASED_OUTPATIENT_CLINIC_OR_DEPARTMENT_OTHER): Payer: Medicare Other

## 2012-09-26 ENCOUNTER — Encounter: Payer: Self-pay | Admitting: Oncology

## 2012-09-26 ENCOUNTER — Ambulatory Visit (HOSPITAL_BASED_OUTPATIENT_CLINIC_OR_DEPARTMENT_OTHER): Payer: Medicare Other | Admitting: Oncology

## 2012-09-26 ENCOUNTER — Other Ambulatory Visit (HOSPITAL_BASED_OUTPATIENT_CLINIC_OR_DEPARTMENT_OTHER): Payer: Medicare Other

## 2012-09-26 ENCOUNTER — Ambulatory Visit: Payer: Medicare Other | Admitting: Internal Medicine

## 2012-09-26 VITALS — BP 126/70 | HR 94 | Temp 97.7°F | Resp 20 | Ht 72.0 in | Wt 276.7 lb

## 2012-09-26 VITALS — BP 128/80 | HR 93 | Temp 97.1°F | Resp 17

## 2012-09-26 DIAGNOSIS — C8590 Non-Hodgkin lymphoma, unspecified, unspecified site: Secondary | ICD-10-CM

## 2012-09-26 DIAGNOSIS — C9110 Chronic lymphocytic leukemia of B-cell type not having achieved remission: Secondary | ICD-10-CM

## 2012-09-26 DIAGNOSIS — Z0289 Encounter for other administrative examinations: Secondary | ICD-10-CM

## 2012-09-26 LAB — COMPREHENSIVE METABOLIC PANEL (CC13)
ALT: 14 U/L (ref 0–55)
AST: 12 U/L (ref 5–34)
Albumin: 3.7 g/dL (ref 3.5–5.0)
Alkaline Phosphatase: 77 U/L (ref 40–150)
BUN: 20.4 mg/dL (ref 7.0–26.0)
CO2: 26 mEq/L (ref 22–29)
Calcium: 9.4 mg/dL (ref 8.4–10.4)
Chloride: 101 mEq/L (ref 98–107)
Creatinine: 1.2 mg/dL (ref 0.7–1.3)
Glucose: 322 mg/dl — ABNORMAL HIGH (ref 70–99)
Potassium: 4.6 mEq/L (ref 3.5–5.1)
Sodium: 135 mEq/L — ABNORMAL LOW (ref 136–145)
Total Bilirubin: 0.43 mg/dL (ref 0.20–1.20)
Total Protein: 6.9 g/dL (ref 6.4–8.3)

## 2012-09-26 LAB — CBC WITH DIFFERENTIAL/PLATELET
BASO%: 0.7 % (ref 0.0–2.0)
Basophils Absolute: 0.1 10*3/uL (ref 0.0–0.1)
Eosinophils Absolute: 0.8 10*3/uL — ABNORMAL HIGH (ref 0.0–0.5)
HCT: 35.3 % — ABNORMAL LOW (ref 38.4–49.9)
HGB: 12.1 g/dL — ABNORMAL LOW (ref 13.0–17.1)
MCHC: 34.3 g/dL (ref 32.0–36.0)
MONO#: 0.7 10*3/uL (ref 0.1–0.9)
NEUT#: 5.1 10*3/uL (ref 1.5–6.5)
NEUT%: 63.4 % (ref 39.0–75.0)
WBC: 8.1 10*3/uL (ref 4.0–10.3)
lymph#: 1.4 10*3/uL (ref 0.9–3.3)

## 2012-09-26 MED ORDER — SODIUM CHLORIDE 0.9 % IV SOLN
Freq: Once | INTRAVENOUS | Status: AC
  Administered 2012-09-26: 12:00:00 via INTRAVENOUS

## 2012-09-26 MED ORDER — ONDANSETRON 8 MG/50ML IVPB (CHCC)
8.0000 mg | Freq: Once | INTRAVENOUS | Status: AC
  Administered 2012-09-26: 8 mg via INTRAVENOUS

## 2012-09-26 MED ORDER — SODIUM CHLORIDE 0.9 % IJ SOLN
10.0000 mL | INTRAMUSCULAR | Status: DC | PRN
  Administered 2012-09-26: 10 mL
  Filled 2012-09-26: qty 10

## 2012-09-26 MED ORDER — DEXAMETHASONE SODIUM PHOSPHATE 10 MG/ML IJ SOLN
10.0000 mg | Freq: Once | INTRAMUSCULAR | Status: AC
  Administered 2012-09-26: 10 mg via INTRAVENOUS

## 2012-09-26 MED ORDER — DIPHENHYDRAMINE HCL 25 MG PO CAPS
50.0000 mg | ORAL_CAPSULE | Freq: Once | ORAL | Status: AC
  Administered 2012-09-26: 50 mg via ORAL

## 2012-09-26 MED ORDER — SODIUM CHLORIDE 0.9 % IV SOLN
90.0000 mg/m2 | Freq: Once | INTRAVENOUS | Status: AC
  Administered 2012-09-26: 225 mg via INTRAVENOUS
  Filled 2012-09-26: qty 45

## 2012-09-26 MED ORDER — ACETAMINOPHEN 325 MG PO TABS
650.0000 mg | ORAL_TABLET | Freq: Once | ORAL | Status: AC
  Administered 2012-09-26: 650 mg via ORAL

## 2012-09-26 MED ORDER — SODIUM CHLORIDE 0.9 % IV SOLN
375.0000 mg/m2 | Freq: Once | INTRAVENOUS | Status: AC
  Administered 2012-09-26: 900 mg via INTRAVENOUS
  Filled 2012-09-26: qty 90

## 2012-09-26 MED ORDER — HEPARIN SOD (PORK) LOCK FLUSH 100 UNIT/ML IV SOLN
500.0000 [IU] | Freq: Once | INTRAVENOUS | Status: AC | PRN
  Administered 2012-09-26: 500 [IU]
  Filled 2012-09-26: qty 5

## 2012-09-26 NOTE — Patient Instructions (Addendum)
[facility] Discharge Instructions for Patients Receiving Chemotherapy  Today you received the following chemotherapy agents : Rituxan & Treanda  To help prevent nausea and vomiting after your treatment, we encourage you to take your nausea medication : Zofran Begin taking it at 1st sign of nausea and take it every 8 hours as needed for nausea.    If you develop nausea and vomiting that is not controlled by your nausea medication, call the clinic. If it is after clinic hours your family physician or the after hours number for the clinic or go to the Emergency Department.   BELOW ARE SYMPTOMS THAT SHOULD BE REPORTED IMMEDIATELY:  *FEVER GREATER THAN 100.5 F  *CHILLS WITH OR WITHOUT FEVER  NAUSEA AND VOMITING THAT IS NOT CONTROLLED WITH YOUR NAUSEA MEDICATION  *UNUSUAL SHORTNESS OF BREATH  *UNUSUAL BRUISING OR BLEEDING  TENDERNESS IN MOUTH AND THROAT WITH OR WITHOUT PRESENCE OF ULCERS  *URINARY PROBLEMS  *BOWEL PROBLEMS  UNUSUAL RASH Items with * indicate a potential emergency and should be followed up as soon as possible.   Feel free to call the clinic you have any questions or concerns. The clinic phone number is [phone].   I have been informed and understand all the instructions given to me. I know to contact the clinic, my physician, or go to the Emergency Department if any problems should occur. I do not have any questions at this time, but understand that I may call the clinic during office hours   should I have any questions or need assistance in obtaining follow up care.    __________________________________________  _____________  __________ Signature of Patient or Authorized Representative            Date                   Time    __________________________________________ Nurse's Signature

## 2012-09-26 NOTE — Progress Notes (Signed)
Hematology and Oncology Follow Up Visit  [name] [name] [id number] 1946/01/10 67 y.o. 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   Prior Therapy: He is S/P lymph node biopsy that was done on 07/13/2012.   Current therapy: He is S/P the first cycle of B-R in 07/2012. He is here for cycle 3.   Interim History: [name] [name] presents today for a follow up visit. He is a nice man with the above diagnosis. He presented with bulky adenopathy above and below the diaphragm. He tolerated chemotherapy so far without complications. He did have an episode of congestion and shortness of breath after the first cycle, but resolved quickly. He reports no fevers or chills. No nausea or vomiting. His abdominal pain has resolved. He is able to eat without any problems. He is not reporting any constipation, nausea, diarrhea, or abdominal pain. He resumed most activities of daily living without any hindrance or decline. He has been driving as well. 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   Medications: I have reviewed the patient's current medications. Current outpatient prescriptions:Alum & Mag Hydroxide-Simeth (GI COCKTAIL) SUSP suspension, Take 30 mLs by mouth 3 (three) times daily as needed for indigestion. Shake well., Disp: 300 mL, Rfl: 0;  aspirin EC 81 MG EC tablet, Take 1 tablet (81 mg total) by mouth daily., Disp: , Rfl: ;  atorvastatin (LIPITOR) 20 MG tablet, Take 1 tablet (20 mg total) by mouth daily at 6 PM., Disp: 30 tablet, Rfl: 5 carvedilol (COREG CR) 10 MG 24 hr capsule, Take 3.25 mg by mouth 2 (two) times daily. , Disp: , Rfl: ;  furosemide (LASIX) 20 MG tablet, Take 20 mg by mouth daily., Disp: , Rfl: ;  lisinopril (PRINIVIL,ZESTRIL) 5 MG tablet, Take 1 tablet (5 mg total) by mouth daily., Disp: 30 tablet, Rfl: 5;  metFORMIN (GLUCOPHAGE) 500 MG tablet, Take 1,000 mg  by mouth 2 (two) times daily with a meal., Disp: , Rfl:  oxymetazoline (AFRIN) 0.05 % nasal spray, Place 2 sprays into the nose 2 (two) times daily as needed. For nasal congestion, Disp: , Rfl: ;  potassium chloride (K-DUR) 10 MEQ tablet, Take 10 mg by mouth daily. , Disp: , Rfl:  No current facility-administered medications for this visit. Facility-Administered Medications Ordered in Other Visits: bendamustine (TREANDA) 225 mg in sodium chloride 0.9 % 500 mL chemo infusion, 90 mg/m2 (Treatment Plan Actual), Intravenous, Once, [name] Core, MD;  heparin lock flush 100 unit/mL, 500 Units, Intracatheter, Once PRN, [name] Core, MD;  riTUXimab (RITUXAN) 900 mg in sodium chloride 0.9 % 160 mL chemo infusion, 375 mg/m2 (Treatment Plan Actual), Intravenous, Once, [name] Core, MD sodium chloride 0.9 % injection 10 mL, 10 mL, Intracatheter, PRN, [name] Core, MD  Allergies: No Known Allergies  Past Medical History, Surgical history, Social history, and Family History were reviewed and updated.  Review of Systems: Constitutional:  Negative for fever, chills, night sweats, anorexia, weight loss, pain. Cardiovascular: no chest pain or dyspnea on exertion Respiratory: negative Neurological: negative Dermatological: negative ENT: negative Skin: Negative. Gastrointestinal: negative Genito-Urinary: negative Hematological and Lymphatic: negative Breast: negative Musculoskeletal: negative Remaining ROS negative.  Physical Exam: Blood pressure 126/70, pulse 94, temperature 97.7 F (36.5 C), temperature source Oral, resp. rate 20, height 6' (1.829 m), weight 276 lb 11.2 oz (125.51 kg). ECOG: 1 General appearance: alert  Head: Normocephalic, without obvious abnormality, atraumatic Neck: no adenopathy, no carotid bruit, no JVD, supple, symmetrical, trachea midline and thyroid not enlarged, symmetric, no tenderness/mass/nodules Lymph nodes: Cervical, supraclavicular, and axillary nodes  normal. Heart:regular rate and rhythm, S1, S2 normal, no murmur, click, rub or gallop Lung:chest clear, no wheezing, rales, normal symmetric air entry Abdomen: soft, non-tender, without masses or organomegaly EXT:no erythema, induration, or nodules Decrease to a resolution of his left cervical adenopathy.   Lab Results: Lab Results  Component Value Date   WBC 8.1 09/26/2012   HGB 12.1* 09/26/2012   HCT 35.3* 09/26/2012   MCV 85.1 09/26/2012   PLT 222 09/26/2012     Chemistry      Component Value Date/Time   NA 135* 09/26/2012 1016   NA 139 07/30/2012 1140   K 4.6 09/26/2012 1016   K 4.7 07/30/2012 1140   CL 101 09/26/2012 1016   CL 102 07/30/2012 1140   CO2 26 09/26/2012 1016   CO2 24 07/14/2012 0432   BUN 20.4 09/26/2012 1016   BUN 16 07/30/2012 1140   CREATININE 1.2 09/26/2012 1016   CREATININE 1.00 07/30/2012 1140      Component Value Date/Time   CALCIUM 9.4 09/26/2012 1016   CALCIUM 9.3 07/14/2012 0432   ALKPHOS 77 09/26/2012 1016   ALKPHOS 84 07/11/2012 0435   AST 12 09/26/2012 1016   AST 22 07/11/2012 0435   ALT 14 09/26/2012 1016   ALT 20 07/11/2012 0435   BILITOT 0.43 09/26/2012 1016   BILITOT 0.6 07/11/2012 0435       Impression and Plan:  This is a 67 year old gentleman with the following issues: 1. 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 He is on B-R chemotherapy. He is tolerating it well without complications. He is ready to proceed with cycle 3 today. Likely he will need 4-6 cycles.   2. Nausea prophylaxis. on Zofran without major issues.   3. IV access: A port in place without issues at this time.   4. Shoulder pain: Due to osteoarthritis. Recommend Aleve BID.  4. Follow up: In 4 weeks for cycle 4.       [name] [name] 3/12/20141:06 PM

## 2012-09-27 ENCOUNTER — Ambulatory Visit (HOSPITAL_BASED_OUTPATIENT_CLINIC_OR_DEPARTMENT_OTHER): Payer: Medicare Other

## 2012-09-27 VITALS — BP 129/59 | HR 74 | Temp 97.6°F

## 2012-09-27 DIAGNOSIS — C8590 Non-Hodgkin lymphoma, unspecified, unspecified site: Secondary | ICD-10-CM

## 2012-09-27 MED ORDER — SODIUM CHLORIDE 0.9 % IV SOLN
90.0000 mg/m2 | Freq: Once | INTRAVENOUS | Status: AC
  Administered 2012-09-27: 225 mg via INTRAVENOUS
  Filled 2012-09-27: qty 45

## 2012-09-27 MED ORDER — HEPARIN SOD (PORK) LOCK FLUSH 100 UNIT/ML IV SOLN
500.0000 [IU] | Freq: Once | INTRAVENOUS | Status: AC | PRN
  Administered 2012-09-27: 500 [IU]
  Filled 2012-09-27: qty 5

## 2012-09-27 MED ORDER — ONDANSETRON 8 MG/50ML IVPB (CHCC)
8.0000 mg | Freq: Once | INTRAVENOUS | Status: AC
  Administered 2012-09-27: 8 mg via INTRAVENOUS

## 2012-09-27 MED ORDER — DEXAMETHASONE SODIUM PHOSPHATE 10 MG/ML IJ SOLN
10.0000 mg | Freq: Once | INTRAMUSCULAR | Status: AC
  Administered 2012-09-27: 10 mg via INTRAVENOUS

## 2012-09-27 MED ORDER — SODIUM CHLORIDE 0.9 % IJ SOLN
10.0000 mL | INTRAMUSCULAR | Status: DC | PRN
  Administered 2012-09-27: 10 mL
  Filled 2012-09-27: qty 10

## 2012-09-27 MED ORDER — SODIUM CHLORIDE 0.9 % IV SOLN
Freq: Once | INTRAVENOUS | Status: AC
  Administered 2012-09-27: 15:00:00 via INTRAVENOUS

## 2012-09-27 NOTE — Patient Instructions (Addendum)
[facility] Discharge Instructions for Patients Receiving Chemotherapy  Today you received the following chemotherapy agents; Treanda  To help prevent nausea and vomiting after your treatment, we encourage you to take your nausea medication; Zofran (ondansetron) as directed.   If you develop nausea and vomiting that is not controlled by your nausea medication, call the clinic. If it is after clinic hours your family physician or the after hours number for the clinic or go to the Emergency Department.   BELOW ARE SYMPTOMS THAT SHOULD BE REPORTED IMMEDIATELY:  *FEVER GREATER THAN 100.5 F  *CHILLS WITH OR WITHOUT FEVER  NAUSEA AND VOMITING THAT IS NOT CONTROLLED WITH YOUR NAUSEA MEDICATION  *UNUSUAL SHORTNESS OF BREATH  *UNUSUAL BRUISING OR BLEEDING  TENDERNESS IN MOUTH AND THROAT WITH OR WITHOUT PRESENCE OF ULCERS  *URINARY PROBLEMS  *BOWEL PROBLEMS  UNUSUAL RASH Items with * indicate a potential emergency and should be followed up as soon as possible.  Feel free to call the clinic you have any questions or concerns. The clinic phone number is [phone].   I have been informed and understand all the instructions given to me. I know to contact the clinic, my physician, or go to the Emergency Department if any problems should occur. I do not have any questions at this time, but understand that I may call the clinic during office hours   should I have any questions or need assistance in obtaining follow up care.    __________________________________________  _____________  __________ Signature of Patient or Authorized Representative            Date                   Time    __________________________________________ Nurse's Signature

## 2012-10-24 ENCOUNTER — Ambulatory Visit (HOSPITAL_BASED_OUTPATIENT_CLINIC_OR_DEPARTMENT_OTHER): Payer: Medicare Other | Admitting: Oncology

## 2012-10-24 ENCOUNTER — Other Ambulatory Visit (HOSPITAL_BASED_OUTPATIENT_CLINIC_OR_DEPARTMENT_OTHER): Payer: Medicare Other | Admitting: Lab

## 2012-10-24 ENCOUNTER — Telehealth: Payer: Self-pay | Admitting: *Deleted

## 2012-10-24 ENCOUNTER — Telehealth: Payer: Self-pay | Admitting: Oncology

## 2012-10-24 ENCOUNTER — Ambulatory Visit (HOSPITAL_BASED_OUTPATIENT_CLINIC_OR_DEPARTMENT_OTHER): Payer: Medicare Other

## 2012-10-24 VITALS — BP 121/78 | HR 100 | Temp 96.8°F | Resp 18 | Ht 72.0 in | Wt 267.6 lb

## 2012-10-24 VITALS — BP 122/74 | HR 77 | Temp 97.8°F | Resp 18

## 2012-10-24 DIAGNOSIS — C8590 Non-Hodgkin lymphoma, unspecified, unspecified site: Secondary | ICD-10-CM

## 2012-10-24 DIAGNOSIS — C8599 Non-Hodgkin lymphoma, unspecified, extranodal and solid organ sites: Secondary | ICD-10-CM

## 2012-10-24 DIAGNOSIS — C9110 Chronic lymphocytic leukemia of B-cell type not having achieved remission: Secondary | ICD-10-CM

## 2012-10-24 DIAGNOSIS — M25559 Pain in unspecified hip: Secondary | ICD-10-CM

## 2012-10-24 DIAGNOSIS — Z5112 Encounter for antineoplastic immunotherapy: Secondary | ICD-10-CM

## 2012-10-24 DIAGNOSIS — Z5111 Encounter for antineoplastic chemotherapy: Secondary | ICD-10-CM

## 2012-10-24 DIAGNOSIS — R0602 Shortness of breath: Secondary | ICD-10-CM

## 2012-10-24 LAB — CBC WITH DIFFERENTIAL/PLATELET
Basophils Absolute: 0 10*3/uL (ref 0.0–0.1)
EOS%: 9.2 % — ABNORMAL HIGH (ref 0.0–7.0)
Eosinophils Absolute: 0.8 10*3/uL — ABNORMAL HIGH (ref 0.0–0.5)
HCT: 37.2 % — ABNORMAL LOW (ref 38.4–49.9)
HGB: 12.8 g/dL — ABNORMAL LOW (ref 13.0–17.1)
MONO#: 0.6 10*3/uL (ref 0.1–0.9)
NEUT#: 5.6 10*3/uL (ref 1.5–6.5)
NEUT%: 67.7 % (ref 39.0–75.0)
RDW: 13.9 % (ref 11.0–14.6)
WBC: 8.3 10*3/uL (ref 4.0–10.3)
lymph#: 1.3 10*3/uL (ref 0.9–3.3)

## 2012-10-24 LAB — COMPREHENSIVE METABOLIC PANEL (CC13)
Albumin: 3.4 g/dL — ABNORMAL LOW (ref 3.5–5.0)
Alkaline Phosphatase: 93 U/L (ref 40–150)
BUN: 12.5 mg/dL (ref 7.0–26.0)
CO2: 23 mEq/L (ref 22–29)
Calcium: 9.6 mg/dL (ref 8.4–10.4)
Chloride: 103 mEq/L (ref 98–107)
Glucose: 239 mg/dl — ABNORMAL HIGH (ref 70–99)
Potassium: 4.5 mEq/L (ref 3.5–5.1)
Sodium: 136 mEq/L (ref 136–145)
Total Protein: 7.2 g/dL (ref 6.4–8.3)

## 2012-10-24 MED ORDER — SODIUM CHLORIDE 0.9 % IV SOLN
Freq: Once | INTRAVENOUS | Status: AC
  Administered 2012-10-24: 11:00:00 via INTRAVENOUS

## 2012-10-24 MED ORDER — ACETAMINOPHEN 325 MG PO TABS
650.0000 mg | ORAL_TABLET | Freq: Once | ORAL | Status: AC
  Administered 2012-10-24: 650 mg via ORAL

## 2012-10-24 MED ORDER — SODIUM CHLORIDE 0.9 % IJ SOLN
10.0000 mL | INTRAMUSCULAR | Status: DC | PRN
  Administered 2012-10-24: 10 mL
  Filled 2012-10-24: qty 10

## 2012-10-24 MED ORDER — SODIUM CHLORIDE 0.9 % IV SOLN
90.0000 mg/m2 | Freq: Once | INTRAVENOUS | Status: AC
  Administered 2012-10-24: 225 mg via INTRAVENOUS
  Filled 2012-10-24: qty 45

## 2012-10-24 MED ORDER — DEXAMETHASONE SODIUM PHOSPHATE 10 MG/ML IJ SOLN
10.0000 mg | Freq: Once | INTRAMUSCULAR | Status: AC
  Administered 2012-10-24: 10 mg via INTRAVENOUS

## 2012-10-24 MED ORDER — SODIUM CHLORIDE 0.9 % IV SOLN
375.0000 mg/m2 | Freq: Once | INTRAVENOUS | Status: AC
  Administered 2012-10-24: 900 mg via INTRAVENOUS
  Filled 2012-10-24: qty 90

## 2012-10-24 MED ORDER — HEPARIN SOD (PORK) LOCK FLUSH 100 UNIT/ML IV SOLN
500.0000 [IU] | Freq: Once | INTRAVENOUS | Status: AC | PRN
  Administered 2012-10-24: 500 [IU]
  Filled 2012-10-24: qty 5

## 2012-10-24 MED ORDER — ONDANSETRON 8 MG/50ML IVPB (CHCC)
8.0000 mg | Freq: Once | INTRAVENOUS | Status: AC
  Administered 2012-10-24: 8 mg via INTRAVENOUS

## 2012-10-24 MED ORDER — DIPHENHYDRAMINE HCL 25 MG PO CAPS
50.0000 mg | ORAL_CAPSULE | Freq: Once | ORAL | Status: AC
  Administered 2012-10-24: 50 mg via ORAL

## 2012-10-24 NOTE — Patient Instructions (Addendum)
[facility] Discharge Instructions for Patients Receiving Chemotherapy  Today you received the following chemotherapy agents rituxan, treanda  To help prevent nausea and vomiting after your treatment, we encourage you to take your nausea medication if needed Begin taking it at 5 pm  and take it as prescribed if needed.   If you develop nausea and vomiting that is not controlled by your nausea medication, call the clinic. If it is after clinic hours your family physician or the after hours number for the clinic or go to the Emergency Department.   BELOW ARE SYMPTOMS THAT SHOULD BE REPORTED IMMEDIATELY:  *FEVER GREATER THAN 100.5 F  *CHILLS WITH OR WITHOUT FEVER  NAUSEA AND VOMITING THAT IS NOT CONTROLLED WITH YOUR NAUSEA MEDICATION  *UNUSUAL SHORTNESS OF BREATH  *UNUSUAL BRUISING OR BLEEDING  TENDERNESS IN MOUTH AND THROAT WITH OR WITHOUT PRESENCE OF ULCERS  *URINARY PROBLEMS  *BOWEL PROBLEMS  UNUSUAL RASH Items with * indicate a potential emergency and should be followed up as soon as possible.   Feel free to call the clinic you have any questions or concerns. The clinic phone number is [phone].   I have been informed and understand all the instructions given to me. I know to contact the clinic, my physician, or go to the Emergency Department if any problems should occur. I do not have any questions at this time, but understand that I may call the clinic during office hours   should I have any questions or need assistance in obtaining follow up care.    __________________________________________  _____________  __________ Signature of Patient or Authorized Representative            Date                   Time    __________________________________________ Nurse's Signature

## 2012-10-24 NOTE — Progress Notes (Signed)
Hematology and Oncology Follow Up Visit  [name] [name] [id number] 1946-04-14 67 y.o. 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   Prior Therapy: He is S/P lymph node biopsy that was done on 07/13/2012.   Current therapy: He is S/P the first cycle of B-R in 07/2012. He is here for cycle 4.   Interim History: [name] [name] presents today for a follow up visit. He is a nice man with the above diagnosis. He presented with bulky adenopathy above and below the diaphragm. He tolerated chemotherapy so far without complications. He did have an episode of congestion and shortness of breath after the first cycle, but resolved quickly. He had another episode in Sunday and now resolving but still report some sob with activity.He reports no fevers or chills. No nausea or vomiting. His abdominal pain has resolved. He is able to eat without any problems. He is not reporting any constipation, nausea, diarrhea, or abdominal pain. He resumed most activities of daily living without any hindrance or decline.  Medications: I have reviewed the patient's current medications. Current outpatient prescriptions:Alum & Mag Hydroxide-Simeth (GI COCKTAIL) SUSP suspension, Take 30 mLs by mouth 3 (three) times daily as needed for indigestion. Shake well., Disp: 300 mL, Rfl: 0;  aspirin EC 81 MG EC tablet, Take 1 tablet (81 mg total) by mouth daily., Disp: , Rfl: ;  atorvastatin (LIPITOR) 20 MG tablet, Take 1 tablet (20 mg total) by mouth daily at 6 PM., Disp: 30 tablet, Rfl: 5 carvedilol (COREG CR) 10 MG 24 hr capsule, Take 3.25 mg by mouth 2 (two) times daily. , Disp: , Rfl: ;  furosemide (LASIX) 20 MG tablet, Take 20 mg by mouth daily., Disp: , Rfl: ;  lisinopril (PRINIVIL,ZESTRIL) 5 MG tablet, Take 1 tablet (5 mg total) by mouth daily., Disp: 30 tablet, Rfl: 5;  metFORMIN (GLUCOPHAGE) 500 MG tablet, Take 1,000 mg by mouth 2 (two) times daily with a meal., Disp: , Rfl:   ondansetron (ZOFRAN) 8 MG tablet, Take 8 mg by mouth every 8 (eight) hours as needed for nausea., Disp: , Rfl: ;  oxymetazoline (AFRIN) 0.05 % nasal spray, Place 2 sprays into the nose 2 (two) times daily as needed. For nasal congestion, Disp: , Rfl: ;  potassium chloride (K-DUR) 10 MEQ tablet, Take 10 mg by mouth daily. , Disp: , Rfl:   Allergies: No Known Allergies  Past Medical History, Surgical history, Social history, and Family History were reviewed and updated.  Review of Systems: Constitutional:  Negative for fever, chills, night sweats, anorexia, weight loss, pain. Cardiovascular: no chest pain or dyspnea on exertion Respiratory: negative Neurological: negative Dermatological: negative ENT: negative Skin: Negative. Gastrointestinal: negative Genito-Urinary: negative Hematological and Lymphatic: negative Breast: negative Musculoskeletal: negative Remaining ROS negative.  Physical Exam: Blood pressure 121/78, pulse 100, temperature 96.8 F (36 C), temperature source Oral, resp. rate 18, height 6' (1.829 m), weight 267 lb 9.6 oz (121.383 kg), SpO2 95.00%. ECOG: 1 General appearance: alert Head: Normocephalic, without obvious abnormality, atraumatic Neck: no adenopathy, no carotid bruit, no JVD, supple, symmetrical, trachea midline and thyroid not enlarged, symmetric, no tenderness/mass/nodules Lymph nodes: Cervical, supraclavicular, and axillary nodes normal. Heart:regular rate and rhythm, S1, S2 normal, no murmur, click, rub or gallop Lung:chest clear, no wheezing, rales, normal symmetric air entry Abdomen: soft, non-tender, without masses or organomegaly EXT:no erythema, induration, or nodules Decrease to a resolution of his left cervical adenopathy.   Lab Results: Lab Results  Component Value Date   WBC 8.3 10/24/2012   HGB 12.8* 10/24/2012   HCT 37.2* 10/24/2012   MCV 85.7 10/24/2012   PLT 275 10/24/2012     Chemistry      Component Value Date/Time   NA 135* 09/26/2012  1016   NA 139 07/30/2012 1140   K 4.6 09/26/2012 1016   K 4.7 07/30/2012 1140   CL 101 09/26/2012 1016   CL 102 07/30/2012 1140   CO2 26 09/26/2012 1016   CO2 24 07/14/2012 0432   BUN 20.4 09/26/2012 1016   BUN 16 07/30/2012 1140   CREATININE 1.2 09/26/2012 1016   CREATININE 1.00 07/30/2012 1140      Component Value Date/Time   CALCIUM 9.4 09/26/2012 1016   CALCIUM 9.3 07/14/2012 0432   ALKPHOS 77 09/26/2012 1016   ALKPHOS 84 07/11/2012 0435   AST 12 09/26/2012 1016   AST 22 07/11/2012 0435   ALT 14 09/26/2012 1016   ALT 20 07/11/2012 0435   BILITOT 0.43 09/26/2012 1016   BILITOT 0.6 07/11/2012 0435       Impression and Plan:  This is a 67 year old gentleman with the following issues: 1. 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 He is on B-R chemotherapy. He is tolerating it well without complications. He is ready to proceed with cycle 4 today. I will obtain CT scan next week for staging and might consider stopping chemotherapy if he has an excellent response.   2. Nausea prophylaxis. on Zofran without major issues.   3. Shortness of breath: It seems to be sporadic in nature. I will obtain CT scan to make sure it is not due to lymphoma.  4. Shoulder pain: Due to osteoarthritis. Recommend Aleve BID.  4. Follow up: In 4 weeks for cycle 5.       Maresa Morash 4/9/201410:13 AM

## 2012-10-24 NOTE — Telephone Encounter (Signed)
Per staff phone call and POF I have schedueld appts.  JMW  

## 2012-10-25 ENCOUNTER — Ambulatory Visit (HOSPITAL_BASED_OUTPATIENT_CLINIC_OR_DEPARTMENT_OTHER): Payer: Medicare Other

## 2012-10-25 DIAGNOSIS — C8590 Non-Hodgkin lymphoma, unspecified, unspecified site: Secondary | ICD-10-CM

## 2012-10-25 DIAGNOSIS — C9110 Chronic lymphocytic leukemia of B-cell type not having achieved remission: Secondary | ICD-10-CM

## 2012-10-25 MED ORDER — SODIUM CHLORIDE 0.9 % IV SOLN
Freq: Once | INTRAVENOUS | Status: AC
  Administered 2012-10-25: 10:00:00 via INTRAVENOUS

## 2012-10-25 MED ORDER — SODIUM CHLORIDE 0.9 % IV SOLN
90.0000 mg/m2 | Freq: Once | INTRAVENOUS | Status: AC
  Administered 2012-10-25: 225 mg via INTRAVENOUS
  Filled 2012-10-25: qty 45

## 2012-10-25 MED ORDER — DEXAMETHASONE SODIUM PHOSPHATE 10 MG/ML IJ SOLN
10.0000 mg | Freq: Once | INTRAMUSCULAR | Status: AC
  Administered 2012-10-25: 10 mg via INTRAVENOUS

## 2012-10-25 MED ORDER — SODIUM CHLORIDE 0.9 % IJ SOLN
10.0000 mL | INTRAMUSCULAR | Status: DC | PRN
  Administered 2012-10-25: 10 mL
  Filled 2012-10-25: qty 10

## 2012-10-25 MED ORDER — ONDANSETRON 8 MG/50ML IVPB (CHCC)
8.0000 mg | Freq: Once | INTRAVENOUS | Status: AC
  Administered 2012-10-25: 8 mg via INTRAVENOUS

## 2012-10-25 MED ORDER — HEPARIN SOD (PORK) LOCK FLUSH 100 UNIT/ML IV SOLN
500.0000 [IU] | Freq: Once | INTRAVENOUS | Status: AC | PRN
  Administered 2012-10-25: 500 [IU]
  Filled 2012-10-25: qty 5

## 2012-10-25 NOTE — Patient Instructions (Signed)
[facility] Discharge Instructions for Patients Receiving Chemotherapy  Today you received the following chemotherapy agents Treanda   To help prevent nausea and vomiting after your treatment, we encourage you to take your nausea medication as directed.   If you develop nausea and vomiting that is not controlled by your nausea medication, call the clinic. If it is after clinic hours your family physician or the after hours number for the clinic or go to the Emergency Department.   BELOW ARE SYMPTOMS THAT SHOULD BE REPORTED IMMEDIATELY:  *FEVER GREATER THAN 100.5 F  *CHILLS WITH OR WITHOUT FEVER  NAUSEA AND VOMITING THAT IS NOT CONTROLLED WITH YOUR NAUSEA MEDICATION  *UNUSUAL SHORTNESS OF BREATH  *UNUSUAL BRUISING OR BLEEDING  TENDERNESS IN MOUTH AND THROAT WITH OR WITHOUT PRESENCE OF ULCERS  *URINARY PROBLEMS  *BOWEL PROBLEMS  UNUSUAL RASH Items with * indicate a potential emergency and should be followed up as soon as possible.  One of the nurses will contact you 24 hours after your treatment. Please let the nurse know about any problems that you may have experienced. Feel free to call the clinic you have any questions or concerns. The clinic phone number is [phone].   I have been informed and understand all the instructions given to me. I know to contact the clinic, my physician, or go to the Emergency Department if any problems should occur. I do not have any questions at this time, but understand that I may call the clinic during office hours   should I have any questions or need assistance in obtaining follow up care.    __________________________________________  _____________  __________ Signature of Patient or Authorized Representative            Date                   Time    __________________________________________ Nurse's Signature

## 2012-10-26 ENCOUNTER — Ambulatory Visit: Payer: Medicare Other | Admitting: Internal Medicine

## 2012-10-26 DIAGNOSIS — Z0289 Encounter for other administrative examinations: Secondary | ICD-10-CM

## 2012-10-30 ENCOUNTER — Encounter (HOSPITAL_COMMUNITY): Payer: Self-pay

## 2012-10-30 ENCOUNTER — Ambulatory Visit (HOSPITAL_COMMUNITY)
Admission: RE | Admit: 2012-10-30 | Discharge: 2012-10-30 | Disposition: A | Discharge status: Home / Self Care | Payer: Medicare Other | Source: Ambulatory Visit | Attending: Oncology | Admitting: Oncology

## 2012-10-30 DIAGNOSIS — M47817 Spondylosis without myelopathy or radiculopathy, lumbosacral region: Secondary | ICD-10-CM | POA: Insufficient documentation

## 2012-10-30 DIAGNOSIS — R599 Enlarged lymph nodes, unspecified: Secondary | ICD-10-CM | POA: Insufficient documentation

## 2012-10-30 DIAGNOSIS — C8589 Other specified types of non-Hodgkin lymphoma, extranodal and solid organ sites: Secondary | ICD-10-CM | POA: Insufficient documentation

## 2012-10-30 DIAGNOSIS — C8590 Non-Hodgkin lymphoma, unspecified, unspecified site: Secondary | ICD-10-CM

## 2012-10-30 DIAGNOSIS — M479 Spondylosis, unspecified: Secondary | ICD-10-CM | POA: Insufficient documentation

## 2012-10-30 MED ORDER — IOHEXOL 300 MG/ML  SOLN
125.0000 mL | Freq: Once | INTRAMUSCULAR | Status: AC | PRN
  Administered 2012-10-30: 125 mL via INTRAVENOUS

## 2012-11-21 ENCOUNTER — Encounter: Payer: Self-pay | Admitting: Oncology

## 2012-11-21 ENCOUNTER — Ambulatory Visit (HOSPITAL_BASED_OUTPATIENT_CLINIC_OR_DEPARTMENT_OTHER): Payer: Medicare Other

## 2012-11-21 ENCOUNTER — Ambulatory Visit (HOSPITAL_BASED_OUTPATIENT_CLINIC_OR_DEPARTMENT_OTHER): Payer: Medicare Other | Admitting: Oncology

## 2012-11-21 ENCOUNTER — Other Ambulatory Visit (HOSPITAL_BASED_OUTPATIENT_CLINIC_OR_DEPARTMENT_OTHER): Payer: Medicare Other

## 2012-11-21 VITALS — BP 157/89 | HR 94 | Temp 97.0°F | Resp 18 | Ht 73.0 in | Wt 278.9 lb

## 2012-11-21 VITALS — BP 136/72 | HR 82 | Temp 98.2°F | Resp 18

## 2012-11-21 DIAGNOSIS — C8599 Non-Hodgkin lymphoma, unspecified, extranodal and solid organ sites: Secondary | ICD-10-CM

## 2012-11-21 DIAGNOSIS — C9110 Chronic lymphocytic leukemia of B-cell type not having achieved remission: Secondary | ICD-10-CM

## 2012-11-21 DIAGNOSIS — C8590 Non-Hodgkin lymphoma, unspecified, unspecified site: Secondary | ICD-10-CM

## 2012-11-21 DIAGNOSIS — Z5112 Encounter for antineoplastic immunotherapy: Secondary | ICD-10-CM

## 2012-11-21 DIAGNOSIS — R0602 Shortness of breath: Secondary | ICD-10-CM

## 2012-11-21 DIAGNOSIS — M25519 Pain in unspecified shoulder: Secondary | ICD-10-CM

## 2012-11-21 DIAGNOSIS — Z5111 Encounter for antineoplastic chemotherapy: Secondary | ICD-10-CM

## 2012-11-21 LAB — COMPREHENSIVE METABOLIC PANEL (CC13)
AST: 12 U/L (ref 5–34)
Alkaline Phosphatase: 77 U/L (ref 40–150)
BUN: 12.9 mg/dL (ref 7.0–26.0)
Calcium: 9.4 mg/dL (ref 8.4–10.4)
Creatinine: 1.1 mg/dL (ref 0.7–1.3)

## 2012-11-21 LAB — CBC WITH DIFFERENTIAL/PLATELET
BASO%: 0.4 % (ref 0.0–2.0)
EOS%: 7.7 % — ABNORMAL HIGH (ref 0.0–7.0)
HCT: 35.4 % — ABNORMAL LOW (ref 38.4–49.9)
LYMPH%: 23.9 % (ref 14.0–49.0)
MCH: 29.3 pg (ref 27.2–33.4)
MCHC: 33.9 g/dL (ref 32.0–36.0)
MONO%: 7.1 % (ref 0.0–14.0)
NEUT%: 60.9 % (ref 39.0–75.0)
Platelets: 178 10*3/uL (ref 140–400)

## 2012-11-21 MED ORDER — DIPHENHYDRAMINE HCL 25 MG PO CAPS
50.0000 mg | ORAL_CAPSULE | Freq: Once | ORAL | Status: AC
  Administered 2012-11-21: 50 mg via ORAL

## 2012-11-21 MED ORDER — HEPARIN SOD (PORK) LOCK FLUSH 100 UNIT/ML IV SOLN
500.0000 [IU] | Freq: Once | INTRAVENOUS | Status: AC | PRN
  Administered 2012-11-21: 500 [IU]
  Filled 2012-11-21: qty 5

## 2012-11-21 MED ORDER — ACETAMINOPHEN 325 MG PO TABS
650.0000 mg | ORAL_TABLET | Freq: Once | ORAL | Status: AC
  Administered 2012-11-21: 650 mg via ORAL

## 2012-11-21 MED ORDER — SODIUM CHLORIDE 0.9 % IV SOLN
Freq: Once | INTRAVENOUS | Status: AC
  Administered 2012-11-21: 11:00:00 via INTRAVENOUS

## 2012-11-21 MED ORDER — SODIUM CHLORIDE 0.9 % IV SOLN
375.0000 mg/m2 | Freq: Once | INTRAVENOUS | Status: AC
  Administered 2012-11-21: 900 mg via INTRAVENOUS
  Filled 2012-11-21: qty 90

## 2012-11-21 MED ORDER — ONDANSETRON 8 MG/50ML IVPB (CHCC)
8.0000 mg | Freq: Once | INTRAVENOUS | Status: AC
  Administered 2012-11-21: 8 mg via INTRAVENOUS

## 2012-11-21 MED ORDER — DEXAMETHASONE SODIUM PHOSPHATE 10 MG/ML IJ SOLN
10.0000 mg | Freq: Once | INTRAMUSCULAR | Status: AC
  Administered 2012-11-21: 10 mg via INTRAVENOUS

## 2012-11-21 MED ORDER — SODIUM CHLORIDE 0.9 % IJ SOLN
10.0000 mL | INTRAMUSCULAR | Status: DC | PRN
  Administered 2012-11-21: 10 mL
  Filled 2012-11-21: qty 10

## 2012-11-21 MED ORDER — SODIUM CHLORIDE 0.9 % IV SOLN
90.0000 mg/m2 | Freq: Once | INTRAVENOUS | Status: AC
  Administered 2012-11-21: 225 mg via INTRAVENOUS
  Filled 2012-11-21: qty 45

## 2012-11-21 NOTE — Progress Notes (Signed)
Hematology and Oncology Follow Up Visit  [name] [name] [id number] 02/25/1946 67 y.o. 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   Prior Therapy: He is S/P lymph node biopsy that was done on 07/13/2012.   Current therapy: He is S/P the first cycle of B-R in 07/2012. He is here for cycle 5.   Interim History: [name] [name] presents today for a follow up visit. He is a nice man with the above diagnosis. He presented with bulky adenopathy above and below the diaphragm. He tolerated chemotherapy so far without complications. He did have an episode of congestion and shortness of breath after the first cycle, but resolved quickly. Reports intermittent dyspnea with activities. He reports no fevers or chills. No nausea or vomiting. His abdominal pain has resolved. He is able to eat without any problems. He is not reporting any constipation, nausea, diarrhea, or abdominal pain. He resumed most activities of daily living without any hindrance or decline.  Medications: I have reviewed the patient's current medications. Current outpatient prescriptions:Alum & Mag Hydroxide-Simeth (GI COCKTAIL) SUSP suspension, Take 30 mLs by mouth 3 (three) times daily as needed for indigestion. Shake well., Disp: 300 mL, Rfl: 0;  aspirin EC 81 MG EC tablet, Take 1 tablet (81 mg total) by mouth daily., Disp: , Rfl: ;  atorvastatin (LIPITOR) 20 MG tablet, Take 1 tablet (20 mg total) by mouth daily at 6 PM., Disp: 30 tablet, Rfl: 5 carvedilol (COREG CR) 10 MG 24 hr capsule, Take 3.25 mg by mouth 2 (two) times daily. , Disp: , Rfl: ;  furosemide (LASIX) 20 MG tablet, Take 20 mg by mouth daily., Disp: , Rfl: ;  lisinopril (PRINIVIL,ZESTRIL) 5 MG tablet, Take 1 tablet (5 mg total) by mouth daily., Disp: 30 tablet, Rfl: 5;  metFORMIN (GLUCOPHAGE) 500 MG tablet, Take 1,000 mg by mouth 2 (two) times daily with a meal., Disp: , Rfl:  ondansetron (ZOFRAN) 8 MG tablet, Take 8 mg by mouth  every 8 (eight) hours as needed for nausea., Disp: , Rfl: ;  oxymetazoline (AFRIN) 0.05 % nasal spray, Place 2 sprays into the nose 2 (two) times daily as needed. For nasal congestion, Disp: , Rfl: ;  potassium chloride (K-DUR) 10 MEQ tablet, Take 10 mg by mouth daily. , Disp: , Rfl:   Allergies: No Known Allergies  Past Medical History, Surgical history, Social history, and Family History were reviewed and updated.  Review of Systems: Constitutional:  Negative for fever, chills, night sweats, anorexia, weight loss, pain. Cardiovascular: no chest pain or dyspnea on exertion Respiratory: negative Neurological: negative Dermatological: negative ENT: negative Skin: Negative. Gastrointestinal: negative Genito-Urinary: negative Hematological and Lymphatic: negative Breast: negative Musculoskeletal: negative Remaining ROS negative.  Physical Exam: Blood pressure 157/89, pulse 94, temperature 97 F (36.1 C), temperature source Oral, resp. rate 18, height 6\' 1"  (1.854 m), weight 278 lb 14.4 oz (126.508 kg). ECOG: 1 General appearance: alert Head: Normocephalic, without obvious abnormality, atraumatic Neck: no adenopathy, no carotid bruit, no JVD, supple, symmetrical, trachea midline and thyroid not enlarged, symmetric, no tenderness/mass/nodules Lymph nodes: Cervical, supraclavicular, and axillary nodes normal. Heart:regular rate and rhythm, S1, S2 normal, no murmur, click, rub or gallop Lung:chest clear, no wheezing, rales, normal symmetric air entry Abdomen: soft, non-tender, without masses or organomegaly EXT:no erythema, induration, or nodules Decrease to a resolution of his left cervical adenopathy.   Lab Results: Lab Results  Component Value Date   WBC 5.5 11/21/2012  HGB 12.0* 11/21/2012   HCT 35.4* 11/21/2012   MCV 86.6 11/21/2012   PLT 178 11/21/2012     Chemistry      Component Value Date/Time   NA 136 10/24/2012 0930   NA 139 07/30/2012 1140   K 4.5 10/24/2012 0930   K 4.7  07/30/2012 1140   CL 103 10/24/2012 0930   CL 102 07/30/2012 1140   CO2 23 10/24/2012 0930   CO2 24 07/14/2012 0432   BUN 12.5 10/24/2012 0930   BUN 16 07/30/2012 1140   CREATININE 1.2 10/24/2012 0930   CREATININE 1.00 07/30/2012 1140      Component Value Date/Time   CALCIUM 9.6 10/24/2012 0930   CALCIUM 9.3 07/14/2012 0432   ALKPHOS 93 10/24/2012 0930   ALKPHOS 84 07/11/2012 0435   AST 10 10/24/2012 0930   AST 22 07/11/2012 0435   ALT 9 10/24/2012 0930   ALT 20 07/11/2012 0435   BILITOT 0.33 10/24/2012 0930   BILITOT 0.6 07/11/2012 0435     Imaging:  *RADIOLOGY REPORT*  Clinical Data: Evaluate lymphoma  CT CHEST, ABDOMEN AND PELVIS WITH CONTRAST  Technique: Multidetector CT imaging of the chest, abdomen and  pelvis was performed following the standard protocol during bolus  administration of intravenous contrast.  Contrast: OMNIPAQUE IOHEXOL 300 MG/ML SOLN  Comparison: 07/08/2012  CT CHEST  Findings:  Lungs/pleura: No pleural effusion identified. There is no airspace  consolidation identified. Pulmonary nodule in the left lower lobe  measures 5 mm, image 32/series 4.  Heart/Mediastinum: Heart size appears normal. Prominent  mediastinal nodes are identified. Right paratracheal lymph node  measures 1.2 cm, image 21/series 2. Previously 2.4 cm.  Prevascular lymph node measures 8 mm, image 21/series 2.  Previously this measured 1.7 cm  Bones/Musculoskeletal: Left supraclavicular lymph node measures 1  cm, image 6/series 2. Previously this measured 1.7 cm. Right  axillary lymph node containing fatty hilum measures 0.7 cm, image  15/series 2. Previously this measured 1.5 cm. Review of the  visualized osseous structures is significant for multilevel  thoracic spondylosis. There are no aggressive lytic or sclerotic  bone lesions.  IMPRESSION:  1. Interval response to therapy. There has been significant  decrease in size of mediastinal, axillary and supraclavicular lymph  nodes.  CT  ABDOMEN AND PELVIS  Findings: There are no focal liver abnormalities identified. The  gallbladder is normal. No biliary dilatation. Normal appearance  of the pancreas. The spleen is normal.  Normal appearance of both adrenal glands. The urinary bladder  appears within normal limits. The prostate gland and seminal  vesicles are unremarkable.  Calcified atherosclerotic disease affects the abdominal aorta and  its branches. There is no aneurysm.  The enlarged new precaval lymph node measures 1.3 cm, image  63/series 2. Previously 3.9 cm. The enlarged jejunal mesenteric  lymph node measures 2.1 cm, image 80/series 2. Previously 4.2 cm.  The aortocaval lymph node just above the aortic bifurcation  measures 0.8 cm, image 85/series 2. Previously 1.7 cm. The right  external iliac lymph node measures 0.7 cm, image 111/series 2.  Previously 1.9 cm.  The stomach appears normal. The small bowel loops have a normal  course and caliber. No obstruction. The appendix is visualized  and appears normal. Normal appearance of the proximal colon.  There are multiple sigmoid diverticula without acute inflammation.  No free fluid or abnormal fluid collections within the abdomen or  pelvis. Review of the visualized bony structures is significant  for multilevel lumbar spondylosis.  IMPRESSION:  1. No acute findings identified within the abdomen or pelvis  2. Interval response to therapy. There has been decrease in size  of retroperitoneal, iliac and mesenteric lymph nodes.  Original Report Authenticated By: Signa Kell, M.D.    Impression and Plan:  This is a 67 year old gentleman with the following issues: 1. 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 He is on B-R chemotherapy. He is tolerating it well without complications. CT showed a response to therapy. Recommend that he proceed with cycle 5 today.    2. Nausea prophylaxis. on Zofran without major issues.   3.  Shortness of breath: It seems to be sporadic in nature. Does not seem to be related to lymphoma per CT scan. Recommend that he see Cardiology due to his history of CHF, MI, CAD, and aortic stenosis.    4. Shoulder pain: Due to osteoarthritis. Recommend Aleve BID.  4. Follow up: In 4 weeks for cycle 6.       [place], [place] 5/7/20149:42 AM

## 2012-11-21 NOTE — Patient Instructions (Signed)
[facility] Discharge Instructions for Patients Receiving Chemotherapy  Today you received the following chemotherapy agents: Rituxan and Treanda.  To help prevent nausea and vomiting after your treatment, we encourage you to take your nausea medication, Zofran. Take it every 8 hours as needed.   If you develop nausea and vomiting that is not controlled by your nausea medication, call the clinic. If it is after clinic hours your family physician or the after hours number for the clinic or go to the Emergency Department.   BELOW ARE SYMPTOMS THAT SHOULD BE REPORTED IMMEDIATELY:  *FEVER GREATER THAN 100.5 F  *CHILLS WITH OR WITHOUT FEVER  NAUSEA AND VOMITING THAT IS NOT CONTROLLED WITH YOUR NAUSEA MEDICATION  *UNUSUAL SHORTNESS OF BREATH  *UNUSUAL BRUISING OR BLEEDING  TENDERNESS IN MOUTH AND THROAT WITH OR WITHOUT PRESENCE OF ULCERS  *URINARY PROBLEMS  *BOWEL PROBLEMS  UNUSUAL RASH Items with * indicate a potential emergency and should be followed up as soon as possible.  Feel free to call the clinic you have any questions or concerns. The clinic phone number is [phone].   I have been informed and understand all the instructions given to me. I know to contact the clinic, my physician, or go to the Emergency Department if any problems should occur. I do not have any questions at this time, but understand that I may call the clinic during office hours   should I have any questions or need assistance in obtaining follow up care.

## 2012-11-21 NOTE — Progress Notes (Signed)
Patient came in and said he got call on a 8000 bill? He is not sure who or what it is for. I gave him directions to [employer] to check on it. He didn't want to call needed to do in person. I also gave him application for asst and Lenise's card. I told him to make sure the ins had paid their portion and he can apply for help for his portion.

## 2012-11-22 ENCOUNTER — Ambulatory Visit (HOSPITAL_BASED_OUTPATIENT_CLINIC_OR_DEPARTMENT_OTHER): Payer: Medicare Other

## 2012-11-22 VITALS — BP 152/84 | HR 105 | Temp 97.5°F

## 2012-11-22 DIAGNOSIS — C9110 Chronic lymphocytic leukemia of B-cell type not having achieved remission: Secondary | ICD-10-CM

## 2012-11-22 DIAGNOSIS — Z5111 Encounter for antineoplastic chemotherapy: Secondary | ICD-10-CM

## 2012-11-22 DIAGNOSIS — C8590 Non-Hodgkin lymphoma, unspecified, unspecified site: Secondary | ICD-10-CM

## 2012-11-22 MED ORDER — SODIUM CHLORIDE 0.9 % IV SOLN
90.0000 mg/m2 | Freq: Once | INTRAVENOUS | Status: AC
  Administered 2012-11-22: 225 mg via INTRAVENOUS
  Filled 2012-11-22: qty 45

## 2012-11-22 MED ORDER — SODIUM CHLORIDE 0.9 % IV SOLN
Freq: Once | INTRAVENOUS | Status: AC
  Administered 2012-11-22: 10:00:00 via INTRAVENOUS

## 2012-11-22 MED ORDER — SODIUM CHLORIDE 0.9 % IJ SOLN
10.0000 mL | INTRAMUSCULAR | Status: DC | PRN
  Administered 2012-11-22: 10 mL
  Filled 2012-11-22: qty 10

## 2012-11-22 MED ORDER — ONDANSETRON 8 MG/50ML IVPB (CHCC)
8.0000 mg | Freq: Once | INTRAVENOUS | Status: AC
  Administered 2012-11-22: 8 mg via INTRAVENOUS

## 2012-11-22 MED ORDER — HEPARIN SOD (PORK) LOCK FLUSH 100 UNIT/ML IV SOLN
500.0000 [IU] | Freq: Once | INTRAVENOUS | Status: AC | PRN
  Administered 2012-11-22: 500 [IU]
  Filled 2012-11-22: qty 5

## 2012-11-22 MED ORDER — DEXAMETHASONE SODIUM PHOSPHATE 10 MG/ML IJ SOLN
10.0000 mg | Freq: Once | INTRAMUSCULAR | Status: AC
  Administered 2012-11-22: 10 mg via INTRAVENOUS

## 2012-11-22 NOTE — Patient Instructions (Signed)
[facility] Discharge Instructions for Patients Receiving Chemotherapy  Today you received the following chemotherapy agents treanda.  To help prevent nausea and vomiting after your treatment, we encourage you to take your nausea medication zofran. Begin taking it at tonight and take it as often as prescribed.   If you develop nausea and vomiting that is not controlled by your nausea medication, call the clinic. If it is after clinic hours your family physician or the after hours number for the clinic or go to the Emergency Department.   BELOW ARE SYMPTOMS THAT SHOULD BE REPORTED IMMEDIATELY:  *FEVER GREATER THAN 100.5 F  *CHILLS WITH OR WITHOUT FEVER  NAUSEA AND VOMITING THAT IS NOT CONTROLLED WITH YOUR NAUSEA MEDICATION  *UNUSUAL SHORTNESS OF BREATH  *UNUSUAL BRUISING OR BLEEDING  TENDERNESS IN MOUTH AND THROAT WITH OR WITHOUT PRESENCE OF ULCERS  *URINARY PROBLEMS  *BOWEL PROBLEMS  UNUSUAL RASH Items with * indicate a potential emergency and should be followed up as soon as possible.  . Feel free to call the clinic you have any questions or concerns. The clinic phone number is [phone].   I have been informed and understand all the instructions given to me. I know to contact the clinic, my physician, or go to the Emergency Department if any problems should occur. I do not have any questions at this time, but understand that I may call the clinic during office hours   should I have any questions or need assistance in obtaining follow up care.    __________________________________________  _____________  __________ Signature of Patient or Authorized Representative            Date                   Time    __________________________________________ Nurse's Signature

## 2012-11-30 ENCOUNTER — Encounter: Payer: Self-pay | Admitting: Internal Medicine

## 2012-11-30 ENCOUNTER — Ambulatory Visit (INDEPENDENT_AMBULATORY_CARE_PROVIDER_SITE_OTHER)
Admission: RE | Admit: 2012-11-30 | Discharge: 2012-11-30 | Disposition: A | Discharge status: Home / Self Care | Payer: Medicare Other | Source: Ambulatory Visit | Attending: Internal Medicine | Admitting: Internal Medicine

## 2012-11-30 ENCOUNTER — Ambulatory Visit (INDEPENDENT_AMBULATORY_CARE_PROVIDER_SITE_OTHER): Payer: Medicare Other | Admitting: Internal Medicine

## 2012-11-30 ENCOUNTER — Other Ambulatory Visit (INDEPENDENT_AMBULATORY_CARE_PROVIDER_SITE_OTHER): Payer: Medicare Other

## 2012-11-30 VITALS — BP 138/76 | HR 86 | Temp 97.3°F | Ht 70.0 in | Wt 279.0 lb

## 2012-11-30 DIAGNOSIS — K219 Gastro-esophageal reflux disease without esophagitis: Secondary | ICD-10-CM

## 2012-11-30 DIAGNOSIS — E119 Type 2 diabetes mellitus without complications: Secondary | ICD-10-CM

## 2012-11-30 DIAGNOSIS — M25519 Pain in unspecified shoulder: Secondary | ICD-10-CM

## 2012-11-30 DIAGNOSIS — I10 Essential (primary) hypertension: Secondary | ICD-10-CM

## 2012-11-30 DIAGNOSIS — E785 Hyperlipidemia, unspecified: Secondary | ICD-10-CM

## 2012-11-30 DIAGNOSIS — Z125 Encounter for screening for malignant neoplasm of prostate: Secondary | ICD-10-CM

## 2012-11-30 LAB — CBC
RBC: 3.93 Mil/uL — ABNORMAL LOW (ref 4.22–5.81)
RDW: 15.2 % — ABNORMAL HIGH (ref 11.5–14.6)
WBC: 5.2 10*3/uL (ref 4.5–10.5)

## 2012-11-30 LAB — LIPID PANEL
HDL: 41.1 mg/dL (ref >39.00)
Triglycerides: 113 mg/dL (ref 0.0–149.0)
VLDL: 22.6 mg/dL (ref 0.0–40.0)

## 2012-11-30 LAB — BASIC METABOLIC PANEL
CO2: 28 mEq/L (ref 19–32)
GFR: 81.79 mL/min (ref >60.00)
Glucose, Bld: 212 mg/dL — ABNORMAL HIGH (ref 70–99)
Potassium: 3.7 mEq/L (ref 3.5–5.1)
Sodium: 138 mEq/L (ref 135–145)

## 2012-11-30 LAB — MICROALBUMIN / CREATININE URINE RATIO: Microalb Creat Ratio: 6.1 mg/g (ref 0.0–30.0)

## 2012-11-30 LAB — HEMOGLOBIN A1C: Hgb A1c MFr Bld: 11.9 % — ABNORMAL HIGH (ref 4.6–6.5)

## 2012-11-30 LAB — PSA, MEDICARE: PSA: 0.61 ng/ml (ref 0.10–4.00)

## 2012-11-30 NOTE — Progress Notes (Signed)
HPI  Pt presents to the clinic today to establish care. He does not have a PCP. He is currently undergoing treatment for lymphoma. He has no concerns today. He doesn't understand why he needs a PCP. He is very angry about his recent health situation. He has not been to a doctor much in the past 40 years and now all of a sudden he is having all these health problems. He does not understand why. He does have hypertension and DM@. He does not check his blood pressure or his blood sugar at home. He is non compliant with his diet.  Flu: 2013 Tetanus: never Pneumovax: never Colonoscopy: never Eye doctor: as needed Dentist: as needed  Past Medical History  Diagnosis Date  . Ischemic cardiomyopathy   . Type II diabetes mellitus   . Hypertension   . Hypercholesterolemia   . Heart murmur   . Shortness of breath     "all the time" (06/06/2012)  . Lower GI bleeding 1980's    "when I was drinking alot" (06/06/2012)  . Arthritis     "both shoulders" (06/06/2012)  . Chronic combined systolic and diastolic CHF (congestive heart failure) 07/10/2012  . GERD (gastroesophageal reflux disease) 07/12/2012  . OSA on CPAP     uses a cpap  . Myocardial infarction 11/13  . Coronary artery disease   . Lymphoma   . History of alcoholism   . Sleep apnea   . Aortic stenosis     Current Outpatient Prescriptions  Medication Sig Dispense Refill  . Alum & Mag Hydroxide-Simeth (GI COCKTAIL) SUSP suspension Take 30 mLs by mouth 3 (three) times daily as needed for indigestion. Shake well.  300 mL  0  . aspirin EC 81 MG EC tablet Take 1 tablet (81 mg total) by mouth daily.      Marland Kitchen atorvastatin (LIPITOR) 20 MG tablet Take 1 tablet (20 mg total) by mouth daily at 6 PM.  30 tablet  5  . carvedilol (COREG CR) 10 MG 24 hr capsule Take 3.25 mg by mouth 2 (two) times daily.       . furosemide (LASIX) 20 MG tablet Take 20 mg by mouth daily.      Marland Kitchen lisinopril (PRINIVIL,ZESTRIL) 5 MG tablet Take 1 tablet (5 mg total) by  mouth daily.  30 tablet  5  . metFORMIN (GLUCOPHAGE) 500 MG tablet Take 1,000 mg by mouth 2 (two) times daily with a meal.      . ondansetron (ZOFRAN) 8 MG tablet Take 8 mg by mouth every 8 (eight) hours as needed for nausea.      Marland Kitchen oxymetazoline (AFRIN) 0.05 % nasal spray Place 2 sprays into the nose 2 (two) times daily as needed. For nasal congestion      . potassium chloride (K-DUR) 10 MEQ tablet Take 10 mg by mouth daily.        No current facility-administered medications for this visit.    No Known Allergies  Family History  Problem Relation Age of Onset  . Osteoarthritis Mother   . Osteoarthritis Father   . Colon cancer Daughter   . Diabetes Father     entire family  . Heart disease Father     History   Social History  . Marital Status: Single    [name]    Number of Children: 2  . Years of Education: 12   Occupational History  . retired    Social History Main Topics  . Smoking status: Former  Smoker -- 1.00 packs/day for 53 years    Types: Cigarettes    Quit date: 07/19/2011  . Smokeless tobacco: Never Used     Comment: 06/06/2012 "refuses smokiong cessation materials; I'm already on Chantix"  . Alcohol Use: No     Comment: 06/06/2012 "haven't had a drink in 22 years; I'm an alcoholic"  . Drug Use: No  . Sexually Active: No   Other Topics Concern  . 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   Lives by self, since '78   [name] in [place]     Regular exercise-no   Caffeine Use-no    ROS:  Constitutional: Denies fever, malaise, fatigue, headache or abrupt weight changes.  HEENT: Pt reports blurred vision. Denies eye pain, eye redness, ear pain, ringing in the ears, wax buildup, runny nose, nasal congestion, bloody nose, or sore throat. Respiratory: Pt reports shortness of breath. Denies difficulty breathing, shortness of breath, cough or sputum production.   Cardiovascular: Denies chest pain,  chest tightness, palpitations or swelling in the hands or feet.  Gastrointestinal: Denies abdominal pain, bloating, constipation, diarrhea or blood in the stool.  GU: Denies frequency, urgency, pain with urination, blood in urine, odor or discharge. Musculoskeletal: Pt reports bilateral shoulder pain. Denies decrease in range of motion, difficulty with gait, muscle pain or joint swelling.  Skin: Denies redness, rashes, lesions or ulcercations.  Neurological: Denies dizziness, difficulty with memory, difficulty with speech or problems with balance and coordination.   No other specific complaints in a complete review of systems (except as listed in HPI above).  PE:  BP 138/76  Pulse 86  Temp(Src) 97.3 F (36.3 C) (Oral)  Ht 5\' 10"  (1.778 m)  Wt 279 lb (126.554 kg)  BMI 40.03 kg/m2  SpO2 95% Wt Readings from Last 3 Encounters:  11/30/12 279 lb (126.554 kg)  11/21/12 278 lb 14.4 oz (126.508 kg)  10/24/12 267 lb 9.6 oz (121.383 kg)    General: Appears his stated age, obese but well developed, well nourished in NAD. HEENT: Head: normal shape and size; Eyes: sclera white, no icterus, conjunctiva pink, PERRLA and EOMs intact; Ears: Tm's gray and intact, normal light reflex; Nose: mucosa pink and moist, septum midline; Throat/Mouth: Teeth present, mucosa pink and moist, no lesions or ulcerations noted.  Neck: Normal range of motion. Neck supple, trachea midline. No massses, lumps or thyromegaly present.  Cardiovascular: Normal rate and rhythm. S1,S2 noted.  No murmur, rubs or gallops noted. No JVD or BLE edema. No carotid bruits noted. Pulmonary/Chest: Normal effort and positive vesicular breath sounds. No respiratory distress. No wheezes, rales or ronchi noted.  Abdomen: Soft and nontender. Normal bowel sounds, no bruits noted. No distention or masses noted. Liver, spleen and kidneys non palpable. Musculoskeletal: decreased internal and external rotation of the shoulders. No signs of joint  swelling. No difficulty with gait.  Neurological: Alert and oriented. Cranial nerves II-XII intact. Coordination normal. +DTRs bilaterally. Psychiatric: Mood slightly angry and affect normal. Behavior is normal. Judgment and thought content normal.      Assessment and Plan:  Prevent Health Maintenance:  All preventative vaccines declined today Will obtain colonoscopy after you finish chemo Will check PSA today Encouraged pt to work on diet and exercise

## 2012-11-30 NOTE — Assessment & Plan Note (Signed)
Will check xray of bilateral shoulders today

## 2012-11-30 NOTE — Assessment & Plan Note (Signed)
Well controlled Tums as needed

## 2012-11-30 NOTE — Assessment & Plan Note (Signed)
Well controlled Continue current therapy Will check RFP today Continue to work on diet and exercise Avoid salt in your diet

## 2012-11-30 NOTE — Assessment & Plan Note (Signed)
Will check A1C and microalbumin today Will refill meds based on levels Continue to work on diet and exercise Encouraged pt to visit opthamologist for dilated eye exam

## 2012-11-30 NOTE — Assessment & Plan Note (Signed)
Will check lipid profile today Will refill meds based on current therapy Continue to work on diet and exercise

## 2012-11-30 NOTE — Patient Instructions (Signed)
How to Avoid Diabetes Problems You can do a lot to prevent or slow down diabetes problems. Following your diabetes plan and taking care of yourself can reduce your risk of serious or life-threatening complications. Below, you will find certain things you can do to prevent diabetes problems. MANAGE YOUR DIABETES Follow your caregiver's, nurse educator's, and dietician's instructions for managing your diabetes. They will teach you the basics of diabetes care. They can help answer questions you may have. Learn about diabetes and make healthy choices regarding eating and physical activity. Monitor your blood glucose level regularly. Your caregiver will help you decide how often to check your blood glucose level depending on your treatment goals and how well you are meeting them.  DO NOT SMOKE Smoking and diabetes are a dangerous combination. Smoking raises your risk for diabetes problems. If you quit smoking, you will lower your risk for heart attack, stroke, nerve disease, and kidney disease. Your cholesterol and your blood pressure levels may improve. Your blood circulation will also improve. If you smoke, ask your caregiver for help in quitting. KEEP YOUR BLOOD PRESSURE UNDER CONTROL Keeping your blood pressure under control will help prevent damage to your eyes, kidneys, heart, and blood vessels. Blood pressure consists of 2 numbers. The top number should be below 130, and the bottom number should be below 80 (130/80). Keep your blood pressure as close to these numbers as you can. If you already have kidney disease, you may want even lower blood pressure to protect your kidneys. Talk to your caregiver to make sure that your blood pressure goal is right for your needs. Meal planning, medicines, and exercise can help you reach your blood pressure target. Have your blood pressure checked at every visit with your caregiver. KEEP YOUR CHOLESTEROL UNDER CONTROL Normal cholesterol levels will help prevent heart  disease and stroke. These are the biggest health problems for people with diabetes. Keeping cholesterol levels under control can also help with blood flow. Have your cholesterol level checked at least once a year. Meal planning, exercise, and medicines can help you reach your cholesterol targets. SCHEDULE AND KEEP YOUR ANNUAL PHYSICAL EXAMS AND EYE EXAMS Your caregiver will tell you how often he or she wants to see you depending on your plan of treatment. It is important that you keep these appointments so that possible problems can be identified early and complications can be avoided or treated.  Every visit with your caregiver should include your weight, blood pressure, and an evaluation of your blood glucose control.  Your hemoglobin A1c should be checked:  At least twice a year if you are at your goal.  Every 3 months if there are changes in treatment.  If you are not meeting your goals.  Your blood lipids should be checked yearly. You should also be checked yearly to see if you have protein in your urine (microalbumin).  Schedule a dilated eye exam if you have type 1 diabetes within 5 years of your diagnosis and then yearly. Schedule a dilated eye exam if you have type 2 diabetes at diagnosis and then yearly. All exams thereafter can be extended to every 2 to 3 years if one or more exams have been normal. KEEP YOUR VACCINES CURRENT The flu vaccine is recommended yearly. The formula for the vaccine changes every year and needs to be updated for the best protection against current viruses. In addition, you should get a vaccination against pneumonia at least once in your life. However, there are some  instances where another vaccine is recommended. Check with your caregiver. TAKE CARE OF YOUR FEET  Diabetes may cause you to have a poor blood supply (circulation) to your legs and feet. Because of this, the skin may be thinner, break easier, and heal more slowly. You also may have nerve damage in  your legs and feet causing decreased feeling. You may not notice minor injuries to your feet that could lead to serious problems or infections. Taking care of your feet is very important. Visual foot exams are performed at every routine medical visit. The exams check for cuts, injuries, or other problems with the feet. A comprehensive foot exam should be done yearly. This includes visual inspection as well as assessing foot pulses and testing for loss of sensation. You should also do the following:  Inspect your feet daily for cuts, calluses, blisters, ingrown toenails, and signs of infection, such as redness, swelling, or pus.  Wash and dry your feet thoroughly, especially between the toes.  Avoid soaking your feet regularly in hot water baths.  Moisturize dry skin with lotion, avoiding areas between your toes.  Cut toenails straight across and file the edges.  Avoid shoes that do not fit well or have areas that irritate your skin.  Avoid going barefooted or wearing only socks. Your feet need protection. TAKE CARE OF YOUR TEETH People with poorly controlled diabetes are more likely to have gum (periodontal) disease. These infections make diabetes harder to control. Periodontal diseases, if left untreated, can lead to tooth loss. Brush your teeth twice a day, floss, and see your dentist for checkups and cleaning every 6 months, or 2 times a year. ASK YOUR CAREGIVER ABOUT TAKING ASPIRIN Taking aspirin daily is recommended to help prevent cardiovascular disease in people with and without diabetes. Ask your caregiver if this would benefit you and what dose he or she would recommend. DRINK RESPONSIBLY Moderate amounts of alcohol (less than 1 drink per day for adult women and less than 2 drinks per day for adult men) have a minimal effect on blood glucose if ingested with food. It is important to eat food with alcohol to avoid hypoglycemia. People should avoid alcohol if they have a history of  alcohol abuse or dependence, if they are pregnant, and if they have liver disease, pancreatitis, advanced neuropathy, or severe hypertriglyceridemia. LESSEN STRESS Living with diabetes can be stressful. When you are under stress, your blood glucose may be affected in two ways:  Stress hormones may cause your blood glucose to rise.  You may be distracted from taking good care of yourself. It is a good idea to be aware of your stress level and make changes that are necessary to help you better manage challenging situations. Support groups, planned relaxation, a hobby you enjoy, meditation, healthy relationships, and exercise all work to lower your stress level. If your efforts do not seem to be helping, get help from your caregiver or a trained mental health professional. Document Released: 03/22/2011 Document Revised: 09/26/2011 Document Reviewed: 03/22/2011 [facility] Patient Information 2013 [place], [place].

## 2012-12-03 ENCOUNTER — Other Ambulatory Visit: Payer: Self-pay | Admitting: Internal Medicine

## 2012-12-03 ENCOUNTER — Telehealth: Payer: Self-pay | Admitting: Internal Medicine

## 2012-12-03 DIAGNOSIS — R0602 Shortness of breath: Secondary | ICD-10-CM

## 2012-12-03 MED ORDER — GLIPIZIDE 10 MG PO TABS: 10.0000 mg | ORAL_TABLET | Freq: Two times a day (BID) | ORAL | Status: DC

## 2012-12-03 NOTE — Telephone Encounter (Signed)
Pt's daughter informed of NP's advisement. Pt's daughter also is inquiring about setting pt up for [facility] services for safety since pt lives alone and has minimal contact with relatives.

## 2012-12-03 NOTE — Telephone Encounter (Signed)
Pt's daughter informed of NP's advisement.

## 2012-12-03 NOTE — Telephone Encounter (Signed)
His medicare will not pay for home health services just to monitor for safety. The patient is able to take care of himself at this time. He is able to bathe, administer his medications, and perform his duties around the house. The best thing to do would to have someone in the area, wether it be a friend or a relative to contact him on a weekly or daily basis just to make sure he is doing ok or see if he needs anything. [name] [name]

## 2012-12-03 NOTE — Telephone Encounter (Signed)
I sent a message to [name] [name] letting him know that if he was still experiencing shortness of breath we could arrange a CT of the chest to r/o a clot in the lungs or anything else that may be causing his shortness of breath. I have not heard back from him but I can go ahead and put the order in. We will call him when it is set up.

## 2012-12-03 NOTE — Telephone Encounter (Signed)
Patients daughter is requesting a call back, she would like to know what to do next since the patients chest x-ray was clear and he is still having a lot of trouble breathing, say patient complains of being winded just walking from room to room, the daughter lives in [place] and is concerned about her father

## 2012-12-04 ENCOUNTER — Other Ambulatory Visit: Payer: Medicare Other

## 2012-12-06 ENCOUNTER — Other Ambulatory Visit: Payer: Self-pay | Admitting: Internal Medicine

## 2012-12-06 ENCOUNTER — Ambulatory Visit: Payer: Medicare Other | Admitting: Cardiology

## 2012-12-06 ENCOUNTER — Ambulatory Visit (INDEPENDENT_AMBULATORY_CARE_PROVIDER_SITE_OTHER)
Admission: RE | Admit: 2012-12-06 | Discharge: 2012-12-06 | Disposition: A | Discharge status: Home / Self Care | Payer: Medicare Other | Source: Ambulatory Visit | Attending: Internal Medicine | Admitting: Internal Medicine

## 2012-12-06 ENCOUNTER — Encounter: Payer: Self-pay | Admitting: Internal Medicine

## 2012-12-06 ENCOUNTER — Ambulatory Visit (INDEPENDENT_AMBULATORY_CARE_PROVIDER_SITE_OTHER): Payer: Medicare Other | Admitting: Internal Medicine

## 2012-12-06 VITALS — BP 124/78 | HR 98 | Temp 97.3°F | Ht 70.0 in | Wt 281.4 lb

## 2012-12-06 DIAGNOSIS — R0602 Shortness of breath: Secondary | ICD-10-CM

## 2012-12-06 MED ORDER — LEVOFLOXACIN 500 MG PO TABS: 500.0000 mg | ORAL_TABLET | Freq: Every day | ORAL | Status: DC

## 2012-12-06 NOTE — Patient Instructions (Signed)

## 2012-12-06 NOTE — Progress Notes (Signed)
Subjective:    Patient ID: [name] [name], male    DOB: 1945-08-19, 67 y.o.   MRN: [id number]  HPI  Pt presents to the clinic today with c/o shortness of breath. This has been an ongoing issue for the past few months but seems to be getting progressively worse. He has discussed this with his oncologist. They did a CT angiogram and he has a negative exam on 10/25/2011. He does use CPAP at night. He denies chest pain, chest tightness, dizziness or near syncope. He just feels like he can't take a deep breath. We are waiting to hear back from Dr. Blanchie Dessert office regarding a stress test. The shortness of breath is worse when he walks but is also not good at rest. He has never had trouble breathing like this before.  Review of Systems      Past Medical History  Diagnosis Date  . Ischemic cardiomyopathy   . Type II diabetes mellitus   . Hypertension   . Hypercholesterolemia   . Heart murmur   . Shortness of breath     "all the time" (06/06/2012)  . Lower GI bleeding 1980's    "when I was drinking alot" (06/06/2012)  . Arthritis     "both shoulders" (06/06/2012)  . Chronic combined systolic and diastolic CHF (congestive heart failure) 07/10/2012  . GERD (gastroesophageal reflux disease) 07/12/2012  . OSA on CPAP     uses a cpap  . Myocardial infarction 11/13  . Coronary artery disease   . Lymphoma   . History of alcoholism   . Sleep apnea   . Aortic stenosis     Current Outpatient Prescriptions  Medication Sig Dispense Refill  . Alum & Mag Hydroxide-Simeth (GI COCKTAIL) SUSP suspension Take 30 mLs by mouth 3 (three) times daily as needed for indigestion. Shake well.  300 mL  0  . aspirin EC 81 MG EC tablet Take 1 tablet (81 mg total) by mouth daily.      Marland Kitchen atorvastatin (LIPITOR) 20 MG tablet Take 1 tablet (20 mg total) by mouth daily at 6 PM.  30 tablet  5  . carvedilol (COREG CR) 10 MG 24 hr capsule Take 3.25 mg by mouth 2 (two) times daily.       . furosemide (LASIX) 20 MG tablet  Take 20 mg by mouth daily.      Marland Kitchen glipiZIDE (GLUCOTROL) 10 MG tablet Take 1 tablet (10 mg total) by mouth 2 (two) times daily before a meal.  60 tablet  3  . lisinopril (PRINIVIL,ZESTRIL) 5 MG tablet Take 1 tablet (5 mg total) by mouth daily.  30 tablet  5  . metFORMIN (GLUCOPHAGE) 500 MG tablet Take 1,000 mg by mouth 2 (two) times daily with a meal.      . ondansetron (ZOFRAN) 8 MG tablet Take 8 mg by mouth every 8 (eight) hours as needed for nausea.      Marland Kitchen oxymetazoline (AFRIN) 0.05 % nasal spray Place 2 sprays into the nose 2 (two) times daily as needed. For nasal congestion      . potassium chloride (K-DUR) 10 MEQ tablet Take 10 mg by mouth daily.        No current facility-administered medications for this visit.    No Known Allergies  Family History  Problem Relation Age of Onset  . Osteoarthritis Mother   . Osteoarthritis Father   . Colon cancer Daughter   . Diabetes Father     entire family  . Heart disease  Father     History   Social History  . Marital Status: Single    [name]    Number of Children: 2  . Years of Education: 12   Occupational History  . retired    Social History Main Topics  . Smoking status: Former Smoker -- 1.00 packs/day for 53 years    Types: Cigarettes    Quit date: 07/19/2011  . Smokeless tobacco: Never Used     Comment: 06/06/2012 "refuses smokiong cessation materials; I'm already on Chantix"  . Alcohol Use: No     Comment: 06/06/2012 "haven't had a drink in 22 years; I'm an alcoholic"  . Drug Use: No  . Sexually Active: No   Other Topics Concern  . 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   Lives by self, since '78   [name] in [place]     Regular exercise-no   Caffeine Use-no     Constitutional: Denies fever, malaise, fatigue, headache or abrupt weight changes.  Respiratory: Pt reports shortness of breath. Denies difficulty breathing, cough or sputum production.    Cardiovascular: Denies chest pain, chest tightness, palpitations or swelling in the hands or feet.  Neurological: Denies dizziness, difficulty with memory, difficulty with speech or problems with balance and coordination.   No other specific complaints in a complete review of systems (except as listed in HPI above).  Objective:   Physical Exam  BP 124/78  Pulse 98  Temp(Src) 97.3 F (36.3 C) (Oral)  Ht 5\' 10"  (1.778 m)  Wt 281 lb 6.4 oz (127.642 kg)  BMI 40.38 kg/m2  SpO2 91% Wt Readings from Last 3 Encounters:  12/06/12 281 lb 6.4 oz (127.642 kg)  11/30/12 279 lb (126.554 kg)  11/21/12 278 lb 14.4 oz (126.508 kg)    General: Appears his stated age, obese but well developed, well nourished in NAD.  Cardiovascular: Normal rate and rhythm. S1,S2 noted.  No murmur, rubs or gallops noted. No JVD or BLE edema. No carotid bruits noted. Pulmonary/Chest: Normal effort and positive vesicular breath sounds. No respiratory distress. No wheezes, rales or ronchi noted.   Neurological: Alert and oriented. Cranial nerves II-XII intact. Coordination normal. +DTRs bilaterally.   BMET    Component Value Date/Time   NA 138 11/30/2012 1520   NA 137 11/21/2012 0851   K 3.7 11/30/2012 1520   K 4.0 11/21/2012 0851   CL 102 11/30/2012 1520   CL 103 11/21/2012 0851   CO2 28 11/30/2012 1520   CO2 24 11/21/2012 0851   GLUCOSE 212* 11/30/2012 1520   GLUCOSE 264* 11/21/2012 0851   BUN 12 11/30/2012 1520   BUN 12.9 11/21/2012 0851   CREATININE 1.2 11/30/2012 1520   CREATININE 1.1 11/21/2012 0851   CALCIUM 8.7 11/30/2012 1520   CALCIUM 9.4 11/21/2012 0851   GFRNONAA 74* 07/14/2012 0432   GFRAA 86* 07/14/2012 0432    Lipid Panel     Component Value Date/Time   CHOL 129 11/30/2012 1520   TRIG 113.0 11/30/2012 1520   HDL 41.10 11/30/2012 1520   CHOLHDL 3 11/30/2012 1520   VLDL 22.6 11/30/2012 1520   LDLCALC 65 11/30/2012 1520    CBC    Component Value Date/Time   WBC 5.2 11/30/2012 1520   WBC 5.5 11/21/2012 0851    RBC 3.93* 11/30/2012 1520   RBC 4.09* 11/21/2012 0851   HGB 11.8* 11/30/2012 1520   HGB 12.0* 11/21/2012 0851   HCT  34.7* 11/30/2012 1520   HCT 35.4* 11/21/2012 0851   PLT 222.0 11/30/2012 1520   PLT 178 11/21/2012 0851   MCV 88.4 11/30/2012 1520   MCV 86.6 11/21/2012 0851   MCH 29.3 11/21/2012 0851   MCH 28.9 07/14/2012 0432   MCHC 33.9 11/30/2012 1520   MCHC 33.9 11/21/2012 0851   RDW 15.2* 11/30/2012 1520   RDW 14.4 11/21/2012 0851   LYMPHSABS 1.3 11/21/2012 0851   LYMPHSABS 7.7* 07/11/2012 0435   MONOABS 0.4 11/21/2012 0851   MONOABS 0.6 07/11/2012 0435   EOSABS 0.4 11/21/2012 0851   EOSABS 0.6 07/11/2012 0435   BASOSABS 0.0 11/21/2012 0851   BASOSABS 0.0 07/11/2012 0435    Hgb A1C Lab Results  Component Value Date   HGBA1C 11.9* 11/30/2012         Assessment & Plan:   Shortness of breath, recurrent:  Would like to do EKG, pt declines stating "i am not having a heart attack, its my breathing" Will obtain chest xray to r/o infection versus pulmonary edema Go over to Dr. Blanchie Dessert office and see if you can be worked in today

## 2012-12-13 ENCOUNTER — Telehealth: Payer: Self-pay | Admitting: *Deleted

## 2012-12-13 ENCOUNTER — Ambulatory Visit: Payer: Medicare Other | Admitting: Internal Medicine

## 2012-12-13 MED ORDER — LEVOFLOXACIN 500 MG PO TABS: 500.0000 mg | ORAL_TABLET | Freq: Every day | ORAL | Status: DC

## 2012-12-13 NOTE — Telephone Encounter (Signed)
Pt informed rx for Levaquin sent to pharmacy, pt wants to cancel appointment with Dr. Yetta Barre for today. Appoinment canceled, pt informed.

## 2012-12-13 NOTE — Telephone Encounter (Signed)
Pt is requesting refill of ABX given last week for pneumonia. He states that he still has congestion and has contacted his drug store for a refill, but was told he would have to contact office.

## 2012-12-13 NOTE — Telephone Encounter (Signed)
Ok to refill 

## 2012-12-17 ENCOUNTER — Other Ambulatory Visit: Payer: Self-pay | Admitting: Physician Assistant

## 2012-12-19 ENCOUNTER — Other Ambulatory Visit (HOSPITAL_BASED_OUTPATIENT_CLINIC_OR_DEPARTMENT_OTHER): Payer: Medicare Other | Admitting: Lab

## 2012-12-19 ENCOUNTER — Ambulatory Visit (HOSPITAL_BASED_OUTPATIENT_CLINIC_OR_DEPARTMENT_OTHER): Payer: Medicare Other

## 2012-12-19 ENCOUNTER — Ambulatory Visit (HOSPITAL_BASED_OUTPATIENT_CLINIC_OR_DEPARTMENT_OTHER): Payer: Medicare Other | Admitting: Oncology

## 2012-12-19 ENCOUNTER — Telehealth: Payer: Self-pay | Admitting: Oncology

## 2012-12-19 VITALS — BP 132/66 | HR 84 | Temp 98.6°F | Resp 16

## 2012-12-19 VITALS — BP 135/80 | HR 102 | Temp 98.6°F | Resp 20 | Ht 70.0 in | Wt 280.0 lb

## 2012-12-19 DIAGNOSIS — C9110 Chronic lymphocytic leukemia of B-cell type not having achieved remission: Secondary | ICD-10-CM

## 2012-12-19 DIAGNOSIS — M25559 Pain in unspecified hip: Secondary | ICD-10-CM

## 2012-12-19 DIAGNOSIS — C8590 Non-Hodgkin lymphoma, unspecified, unspecified site: Secondary | ICD-10-CM

## 2012-12-19 DIAGNOSIS — R0602 Shortness of breath: Secondary | ICD-10-CM

## 2012-12-19 DIAGNOSIS — Z5112 Encounter for antineoplastic immunotherapy: Secondary | ICD-10-CM

## 2012-12-19 DIAGNOSIS — C8599 Non-Hodgkin lymphoma, unspecified, extranodal and solid organ sites: Secondary | ICD-10-CM

## 2012-12-19 DIAGNOSIS — Z5111 Encounter for antineoplastic chemotherapy: Secondary | ICD-10-CM

## 2012-12-19 LAB — COMPREHENSIVE METABOLIC PANEL (CC13)
Albumin: 3.7 g/dL (ref 3.5–5.0)
CO2: 24 mEq/L (ref 22–29)
Chloride: 106 mEq/L (ref 98–107)
Glucose: 102 mg/dl — ABNORMAL HIGH (ref 70–99)
Potassium: 4 mEq/L (ref 3.5–5.1)
Sodium: 141 mEq/L (ref 136–145)
Total Protein: 6.9 g/dL (ref 6.4–8.3)

## 2012-12-19 LAB — CBC WITH DIFFERENTIAL/PLATELET
Basophils Absolute: 0 10*3/uL (ref 0.0–0.1)
Eosinophils Absolute: 0.9 10*3/uL — ABNORMAL HIGH (ref 0.0–0.5)
HGB: 10.8 g/dL — ABNORMAL LOW (ref 13.0–17.1)
MCV: 90.2 fL (ref 79.3–98.0)
NEUT#: 3.3 10*3/uL (ref 1.5–6.5)
RDW: 15.2 % — ABNORMAL HIGH (ref 11.0–14.6)
lymph#: 1 10*3/uL (ref 0.9–3.3)

## 2012-12-19 MED ORDER — SODIUM CHLORIDE 0.9 % IV SOLN
375.0000 mg/m2 | Freq: Once | INTRAVENOUS | Status: AC
  Administered 2012-12-19: 900 mg via INTRAVENOUS
  Filled 2012-12-19: qty 90

## 2012-12-19 MED ORDER — SODIUM CHLORIDE 0.9 % IV SOLN
Freq: Once | INTRAVENOUS | Status: AC
  Administered 2012-12-19: 10:00:00 via INTRAVENOUS

## 2012-12-19 MED ORDER — ACETAMINOPHEN 325 MG PO TABS
650.0000 mg | ORAL_TABLET | Freq: Once | ORAL | Status: AC
  Administered 2012-12-19: 650 mg via ORAL

## 2012-12-19 MED ORDER — SODIUM CHLORIDE 0.9 % IJ SOLN
10.0000 mL | INTRAMUSCULAR | Status: DC | PRN
  Administered 2012-12-19: 10 mL
  Filled 2012-12-19: qty 10

## 2012-12-19 MED ORDER — ONDANSETRON 8 MG/50ML IVPB (CHCC)
8.0000 mg | Freq: Once | INTRAVENOUS | Status: AC
  Administered 2012-12-19: 8 mg via INTRAVENOUS

## 2012-12-19 MED ORDER — DEXAMETHASONE SODIUM PHOSPHATE 10 MG/ML IJ SOLN
10.0000 mg | Freq: Once | INTRAMUSCULAR | Status: AC
  Administered 2012-12-19: 10 mg via INTRAVENOUS

## 2012-12-19 MED ORDER — SODIUM CHLORIDE 0.9 % IV SOLN
90.0000 mg/m2 | Freq: Once | INTRAVENOUS | Status: AC
  Administered 2012-12-19: 225 mg via INTRAVENOUS
  Filled 2012-12-19: qty 45

## 2012-12-19 MED ORDER — DIPHENHYDRAMINE HCL 25 MG PO CAPS
50.0000 mg | ORAL_CAPSULE | Freq: Once | ORAL | Status: AC
  Administered 2012-12-19: 50 mg via ORAL

## 2012-12-19 MED ORDER — HEPARIN SOD (PORK) LOCK FLUSH 100 UNIT/ML IV SOLN
500.0000 [IU] | Freq: Once | INTRAVENOUS | Status: AC | PRN
  Administered 2012-12-19: 500 [IU]
  Filled 2012-12-19: qty 5

## 2012-12-19 NOTE — Progress Notes (Signed)
Hematology and Oncology Follow Up Visit  [name] [name] [id number] 04-21-1946 67 y.o. 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   Prior Therapy: He is S/P lymph node biopsy that was done on 07/13/2012.   Current therapy: He is S/P the first cycle of B-R in 07/2012. He is here for cycle 6.   Interim History: [name] [name] presents today for a follow up visit. He is a nice man with the above diagnosis. He presented with bulky adenopathy above and below the diaphragm. He tolerated chemotherapy so far without complications.  Reports intermittent dyspnea with activities which has improved with Levaquin. He reports no fevers or chills. No nausea or vomiting. His abdominal pain has resolved. He is able to eat without any problems. He is not reporting any constipation, nausea, diarrhea, or abdominal pain. He resumed most activities of daily living without any hindrance or decline.  Medications: I have reviewed the patient's current medications. Current Outpatient Prescriptions  Medication Sig Dispense Refill  . Alum & Mag Hydroxide-Simeth (GI COCKTAIL) SUSP suspension Take 30 mLs by mouth 3 (three) times daily as needed for indigestion. Shake well.  300 mL  0  . aspirin EC 81 MG EC tablet Take 1 tablet (81 mg total) by mouth daily.      Marland Kitchen atorvastatin (LIPITOR) 20 MG tablet Take 1 tablet (20 mg total) by mouth daily at 6 PM.  30 tablet  5  . carvedilol (COREG CR) 10 MG 24 hr capsule Take 3.25 mg by mouth 2 (two) times daily.       . furosemide (LASIX) 20 MG tablet Take 20 mg by mouth daily.      Marland Kitchen glipiZIDE (GLUCOTROL) 10 MG tablet Take 1 tablet (10 mg total) by mouth 2 (two) times daily before a meal.  60 tablet  3  . levofloxacin (LEVAQUIN) 500 MG tablet Take 1 tablet (500 mg total) by mouth daily.  7 tablet  0  . lisinopril (PRINIVIL,ZESTRIL) 5 MG tablet Take 1 tablet (5 mg total) by mouth daily.  30 tablet  5  . metFORMIN (GLUCOPHAGE) 500 MG  tablet Take 1,000 mg by mouth 2 (two) times daily with a meal.      . ondansetron (ZOFRAN) 8 MG tablet Take 8 mg by mouth every 8 (eight) hours as needed for nausea.      Marland Kitchen oxymetazoline (AFRIN) 0.05 % nasal spray Place 2 sprays into the nose 2 (two) times daily as needed. For nasal congestion      . potassium chloride (K-DUR) 10 MEQ tablet Take 10 mg by mouth daily.        No current facility-administered medications for this visit.     Allergies: No Known Allergies  Past Medical History, Surgical history, Social history, and Family History were reviewed and updated.  Review of Systems: Constitutional:  Negative for fever, chills, night sweats, anorexia, weight loss, pain. Cardiovascular: no chest pain or dyspnea on exertion Respiratory: negative Neurological: negative Dermatological: negative ENT: negative Skin: Negative. Gastrointestinal: negative Genito-Urinary: negative Hematological and Lymphatic: negative Breast: negative Musculoskeletal: negative Remaining ROS negative.  Physical Exam: Blood pressure 135/80, pulse 102, temperature 98.6 F (37 C), temperature source Oral, resp. rate 20, height 5\' 10"  (1.778 m), weight 280 lb (127.007 kg). ECOG: 1 General appearance: alert Head: Normocephalic, without obvious abnormality, atraumatic Neck: no adenopathy, no carotid bruit, no JVD, supple, symmetrical, trachea midline and thyroid not enlarged, symmetric, no tenderness/mass/nodules Lymph nodes: Cervical,  supraclavicular, and axillary nodes normal. Heart:regular rate and rhythm, S1, S2 normal, no murmur, click, rub or gallop Lung:chest clear, no wheezing, rales, normal symmetric air entry Abdomen: soft, non-tender, without masses or organomegaly EXT:no erythema, induration, or nodules Decrease to a resolution of his left cervical adenopathy.   Lab Results: Lab Results  Component Value Date   WBC 5.9 12/19/2012   HGB 10.8* 12/19/2012   HCT 33.2* 12/19/2012   MCV 90.2  12/19/2012   PLT 198 12/19/2012     Chemistry      Component Value Date/Time   NA 138 11/30/2012 1520   NA 137 11/21/2012 0851   K 3.7 11/30/2012 1520   K 4.0 11/21/2012 0851   CL 102 11/30/2012 1520   CL 103 11/21/2012 0851   CO2 28 11/30/2012 1520   CO2 24 11/21/2012 0851   BUN 12 11/30/2012 1520   BUN 12.9 11/21/2012 0851   CREATININE 1.2 11/30/2012 1520   CREATININE 1.1 11/21/2012 0851      Component Value Date/Time   CALCIUM 8.7 11/30/2012 1520   CALCIUM 9.4 11/21/2012 0851   ALKPHOS 77 11/21/2012 0851   ALKPHOS 84 07/11/2012 0435   AST 12 11/21/2012 0851   AST 22 07/11/2012 0435   ALT 14 11/21/2012 0851   ALT 20 07/11/2012 0435   BILITOT 0.60 11/21/2012 0851   BILITOT 0.6 07/11/2012 0435     Impression and Plan:  This is a 67 year old gentleman with the following issues: 1. Chronic lymphocytic leukemia/small lymphocytic lymphoma. He is on B-R chemotherapy. He is tolerating it well without complications. CT on 10/2012 showed a response to therapy. Recommend that he proceed with cycle 6 today and will repeat scans in in 01/2013 to assess response. I anticipate this being his last cycle of chemotherapy.  2. Nausea prophylaxis. on Zofran without major issues.   3. Shortness of breath: Improved now.    4. Shoulder pain: Due to osteoarthritis. Recommend Aleve BID.  5. Follow up: In 6 weeks for port flush and repeat imaging studies.       [name] [name] 6/4/20148:54 AM

## 2012-12-19 NOTE — Telephone Encounter (Signed)
gv and printed appt sched and avs for pt  °

## 2012-12-19 NOTE — Patient Instructions (Addendum)
[facility] Discharge Instructions for Patients Receiving Chemotherapy  Today you received the following chemotherapy agents Rituxan, Treanda  To help prevent nausea and vomiting after your treatment, we encourage you to take your nausea medication    If you develop nausea and vomiting that is not controlled by your nausea medication, call the clinic.   BELOW ARE SYMPTOMS THAT SHOULD BE REPORTED IMMEDIATELY:  *FEVER GREATER THAN 100.5 F  *CHILLS WITH OR WITHOUT FEVER  NAUSEA AND VOMITING THAT IS NOT CONTROLLED WITH YOUR NAUSEA MEDICATION  *UNUSUAL SHORTNESS OF BREATH  *UNUSUAL BRUISING OR BLEEDING  TENDERNESS IN MOUTH AND THROAT WITH OR WITHOUT PRESENCE OF ULCERS  *URINARY PROBLEMS  *BOWEL PROBLEMS  UNUSUAL RASH Items with * indicate a potential emergency and should be followed up as soon as possible.  Feel free to call the clinic you have any questions or concerns. The clinic phone number is [phone].    

## 2012-12-20 ENCOUNTER — Ambulatory Visit (HOSPITAL_BASED_OUTPATIENT_CLINIC_OR_DEPARTMENT_OTHER): Payer: Medicare Other

## 2012-12-20 ENCOUNTER — Telehealth: Payer: Self-pay | Admitting: *Deleted

## 2012-12-20 VITALS — BP 155/96 | HR 99 | Temp 97.8°F | Resp 18

## 2012-12-20 DIAGNOSIS — Z5111 Encounter for antineoplastic chemotherapy: Secondary | ICD-10-CM

## 2012-12-20 DIAGNOSIS — C9110 Chronic lymphocytic leukemia of B-cell type not having achieved remission: Secondary | ICD-10-CM

## 2012-12-20 DIAGNOSIS — C8590 Non-Hodgkin lymphoma, unspecified, unspecified site: Secondary | ICD-10-CM

## 2012-12-20 MED ORDER — SODIUM CHLORIDE 0.9 % IV SOLN
90.0000 mg/m2 | Freq: Once | INTRAVENOUS | Status: AC
  Administered 2012-12-20: 225 mg via INTRAVENOUS
  Filled 2012-12-20: qty 45

## 2012-12-20 MED ORDER — DEXAMETHASONE SODIUM PHOSPHATE 10 MG/ML IJ SOLN
10.0000 mg | Freq: Once | INTRAMUSCULAR | Status: AC
  Administered 2012-12-20: 10 mg via INTRAVENOUS

## 2012-12-20 MED ORDER — ONDANSETRON 8 MG/50ML IVPB (CHCC)
8.0000 mg | Freq: Once | INTRAVENOUS | Status: AC
  Administered 2012-12-20: 8 mg via INTRAVENOUS

## 2012-12-20 MED ORDER — SODIUM CHLORIDE 0.9 % IJ SOLN
10.0000 mL | INTRAMUSCULAR | Status: DC | PRN
  Administered 2012-12-20: 10 mL
  Filled 2012-12-20: qty 10

## 2012-12-20 MED ORDER — SODIUM CHLORIDE 0.9 % IV SOLN
Freq: Once | INTRAVENOUS | Status: AC
  Administered 2012-12-20: 10:00:00 via INTRAVENOUS

## 2012-12-20 MED ORDER — HEPARIN SOD (PORK) LOCK FLUSH 100 UNIT/ML IV SOLN
500.0000 [IU] | Freq: Once | INTRAVENOUS | Status: AC | PRN
  Administered 2012-12-20: 500 [IU]
  Filled 2012-12-20: qty 5

## 2012-12-20 NOTE — Telephone Encounter (Signed)
Left msg on triage stating finish both meds [name] [name] rx still having SOB does he need refill on antibiotic. called pt back inform pt he will need to make f/u appt since symptom is still there. Transferred to schedulers to make appt...lmb

## 2012-12-20 NOTE — Patient Instructions (Signed)
[facility] Discharge Instructions for Patients Receiving Chemotherapy  Today you received the following chemotherapy agents :  Treanda.  To help prevent nausea and vomiting after your treatment, we encourage you to take your nausea medication  As instructed by your physician.   If you develop nausea and vomiting that is not controlled by your nausea medication, call the clinic.   BELOW ARE SYMPTOMS THAT SHOULD BE REPORTED IMMEDIATELY:  *FEVER GREATER THAN 100.5 F  *CHILLS WITH OR WITHOUT FEVER  NAUSEA AND VOMITING THAT IS NOT CONTROLLED WITH YOUR NAUSEA MEDICATION  *UNUSUAL SHORTNESS OF BREATH  *UNUSUAL BRUISING OR BLEEDING  TENDERNESS IN MOUTH AND THROAT WITH OR WITHOUT PRESENCE OF ULCERS  *URINARY PROBLEMS  *BOWEL PROBLEMS  UNUSUAL RASH Items with * indicate a potential emergency and should be followed up as soon as possible.  Feel free to call the clinic you have any questions or concerns. The clinic phone number is [phone].

## 2012-12-21 ENCOUNTER — Ambulatory Visit (INDEPENDENT_AMBULATORY_CARE_PROVIDER_SITE_OTHER): Payer: Medicare Other | Admitting: Pulmonary Disease

## 2012-12-21 ENCOUNTER — Ambulatory Visit (INDEPENDENT_AMBULATORY_CARE_PROVIDER_SITE_OTHER): Payer: Medicare Other | Admitting: Internal Medicine

## 2012-12-21 ENCOUNTER — Encounter: Payer: Self-pay | Admitting: Pulmonary Disease

## 2012-12-21 ENCOUNTER — Encounter: Payer: Self-pay | Admitting: Internal Medicine

## 2012-12-21 ENCOUNTER — Institutional Professional Consult (permissible substitution): Payer: Medicare Other | Admitting: Internal Medicine

## 2012-12-21 ENCOUNTER — Ambulatory Visit (INDEPENDENT_AMBULATORY_CARE_PROVIDER_SITE_OTHER)
Admission: RE | Admit: 2012-12-21 | Discharge: 2012-12-21 | Disposition: A | Discharge status: Home / Self Care | Payer: Medicare Other | Source: Ambulatory Visit | Attending: Internal Medicine | Admitting: Internal Medicine

## 2012-12-21 VITALS — BP 124/82 | HR 94 | Temp 98.4°F | Ht 74.0 in | Wt 289.8 lb

## 2012-12-21 VITALS — BP 120/80 | HR 105 | Temp 98.4°F | Ht 74.0 in | Wt 289.4 lb

## 2012-12-21 DIAGNOSIS — R0602 Shortness of breath: Secondary | ICD-10-CM

## 2012-12-21 DIAGNOSIS — R0902 Hypoxemia: Secondary | ICD-10-CM

## 2012-12-21 DIAGNOSIS — R0989 Other specified symptoms and signs involving the circulatory and respiratory systems: Secondary | ICD-10-CM

## 2012-12-21 DIAGNOSIS — R0609 Other forms of dyspnea: Secondary | ICD-10-CM | POA: Insufficient documentation

## 2012-12-21 NOTE — Telephone Encounter (Signed)
Pt state he is very upset-been to [facility] 3 times his medicine is still not there-Please call his Lisinopril 5mg  today-cant hardly breathe!Call to [facility] at [phone]!

## 2012-12-21 NOTE — Assessment & Plan Note (Signed)
The patient has significant dyspnea on exertion that I suspect is multifactorial.  He is clearly widely obese and deconditioned, and has gained 30-40 pounds in the last 8 months.  He has a known cardiomyopathy with aortic valve disease, and has not had an echo since last fall.  He has also received chemotherapy for lymphoma, and it is unknown to me whether this may have cardiac or pulmonary toxicity.  This will need to be reviewed with his oncologist.  Finally, the patient is a long-standing history of smoking, and is likely to have some degree of obstructive airways disease.  At this point, I would like to schedule for full PFTs, echocardiogram, and also start him on oxygen with exertion.  Will also need to do overnight oximetry to see if he needs oxygen while sleeping with his CPAP device.

## 2012-12-21 NOTE — Progress Notes (Signed)
  Subjective:    Patient ID: [name] [name], male    DOB: 08-04-1945, 67 y.o.   MRN: [id number]  HPI The patient is a very complicated 67 year old male who aren't been asked to see for dyspnea.  He states that he was in his usual state of health until January of this year when he was treated with chemotherapy for lymphoma.  Since that time, he has had progressive dyspnea on exertion, and describes shortness of breath at less than one block at a slow pace.  He will also get winded bringing groceries in from the car.  He denies any cough, congestion, or mucus production.  He does have lower extremity edema, and has gained 30-40 pounds in 8 months.  The patient has a history of a cardiomyopathy and congestive heart failure, along with valvular heart disease.  His echo in October of last year showed an EF of 40-45%, and moderate aortic stenosis.  Patient also has a history of smoking for many years, and has never had PFTs to evaluate for obstructive lung disease.  He recently had a chest x-ray in May that showed patchy basilar airspace disease, was treated with Levaquin, and a followup film this month was greatly improved.  It is unknown if the chemotherapy that he received 2 calls cardiomyopathy or have pulmonary toxicity.  I will review this with his oncologist.  The patient had an oxygen saturation of 87% today in the office with ambulation from the waiting room to the exam room.   Review of Systems  Constitutional: Negative for fever and unexpected weight change.  HENT: Negative for ear pain, nosebleeds, congestion, sore throat, rhinorrhea, sneezing, trouble swallowing, dental problem, postnasal drip and sinus pressure.   Eyes: Negative for redness and itching.  Respiratory: Positive for shortness of breath. Negative for cough, chest tightness and wheezing.   Cardiovascular: Negative for palpitations and leg swelling.  Gastrointestinal: Negative for nausea and vomiting.  Genitourinary: Negative for  dysuria.  Musculoskeletal: Positive for joint swelling.  Skin: Negative for rash.  Neurological: Negative for headaches.  Hematological: Does not bruise/bleed easily.  Psychiatric/Behavioral: Negative for dysphoric mood. The patient is not nervous/anxious.        Objective:   Physical Exam Constitutional:  Morbidly obese male, no acute distress  HENT:  Nares patent without discharge  Oropharynx without exudate, palate and uvula are mildly elongated.   Eyes:  Perrla, eomi, no scleral icterus  Neck:  No JVD, no TMG  Cardiovascular:  Normal rate, regular rhythm, no rubs or gallops.  3/6 sem        Intact distal pulses  Pulmonary :  Normal breath sounds, no stridor or respiratory distress   No rales, rhonchi, or wheezing  Abdominal:  Soft, nondistended, bowel sounds present.  No tenderness noted.   Musculoskeletal:  1+ lower extremity edema noted.  Lymph Nodes:  No cervical lymphadenopathy noted  Skin:  No cyanosis noted  Neurologic:  Alert, appropriate, moves all 4 extremities without obvious deficit.         Assessment & Plan:

## 2012-12-21 NOTE — Patient Instructions (Addendum)
Will schedule for breathing tests to evaluate for emphysema Will schedule for echo with Dr. Rennis Golden to evaluate heart muscle and your valve. Will start on oxygen with exertion, and also check your oxygen level overnight. Will see you back same day as your breathing studies.

## 2012-12-21 NOTE — Patient Instructions (Signed)

## 2012-12-21 NOTE — Progress Notes (Signed)
Subjective:    Patient ID: [name] [name], male    DOB: 1945-11-20, 68 y.o.   MRN: [id number]  HPI  Pt presents to the clinic today with c/o shortness of breath. This has been an ongoing issue for him. The shortness of breath seems to be worse with exertion. CT angio test was performed in 10/2012, negative for any acute findings. Chest xray was done 11/2012, which showed possible aspiration versus pneumonia, treated with Levaquin. He did report some mild improvement with breathing while on the antibiotic. He was advised to follow up with Cardiology as well to have a stress test to r/o cardiac issues. Dr. Rennis Golden told him that he had to have the stress test ordered by me. He feels like he cannot live much longer like this.  Review of Systems  Past Medical History  Diagnosis Date  . Ischemic cardiomyopathy   . Type II diabetes mellitus   . Hypertension   . Hypercholesterolemia   . Heart murmur   . Shortness of breath     "all the time" (06/06/2012)  . Lower GI bleeding 1980's    "when I was drinking alot" (06/06/2012)  . Arthritis     "both shoulders" (06/06/2012)  . Chronic combined systolic and diastolic CHF (congestive heart failure) 07/10/2012  . GERD (gastroesophageal reflux disease) 07/12/2012  . OSA on CPAP     uses a cpap  . Myocardial infarction 11/13  . Coronary artery disease   . Lymphoma   . History of alcoholism   . Sleep apnea   . Aortic stenosis     Current Outpatient Prescriptions  Medication Sig Dispense Refill  . Alum & Mag Hydroxide-Simeth (GI COCKTAIL) SUSP suspension Take 30 mLs by mouth 3 (three) times daily as needed for indigestion. Shake well.  300 mL  0  . aspirin EC 81 MG EC tablet Take 1 tablet (81 mg total) by mouth daily.      Marland Kitchen atorvastatin (LIPITOR) 20 MG tablet Take 1 tablet (20 mg total) by mouth daily at 6 PM.  30 tablet  5  . carvedilol (COREG CR) 10 MG 24 hr capsule Take 3.25 mg by mouth 2 (two) times daily.       . furosemide (LASIX) 20 MG  tablet Take 20 mg by mouth daily.      Marland Kitchen glipiZIDE (GLUCOTROL) 10 MG tablet Take 1 tablet (10 mg total) by mouth 2 (two) times daily before a meal.  60 tablet  3  . levofloxacin (LEVAQUIN) 500 MG tablet Take 1 tablet (500 mg total) by mouth daily.  7 tablet  0  . lisinopril (PRINIVIL,ZESTRIL) 5 MG tablet Take 1 tablet (5 mg total) by mouth daily.  30 tablet  5  . metFORMIN (GLUCOPHAGE) 500 MG tablet Take 1,000 mg by mouth 2 (two) times daily with a meal.      . ondansetron (ZOFRAN) 8 MG tablet Take 8 mg by mouth every 8 (eight) hours as needed for nausea.      Marland Kitchen oxymetazoline (AFRIN) 0.05 % nasal spray Place 2 sprays into the nose 2 (two) times daily as needed. For nasal congestion      . potassium chloride (K-DUR) 10 MEQ tablet Take 10 mg by mouth daily.        No current facility-administered medications for this visit.    No Known Allergies  Family History  Problem Relation Age of Onset  . Osteoarthritis Mother   . Osteoarthritis Father   . Colon cancer Daughter   .  Diabetes Father     entire family  . Heart disease Father     History   Social History  . Marital Status: Single    [name]    Number of Children: 2  . Years of Education: 12   Occupational History  . retired    Social History Main Topics  . Smoking status: Former Smoker -- 1.00 packs/day for 53 years    Types: Cigarettes    Quit date: 07/19/2011  . Smokeless tobacco: Never Used     Comment: 06/06/2012 "refuses smokiong cessation materials; I'm already on Chantix"  . Alcohol Use: No     Comment: 06/06/2012 "haven't had a drink in 22 years; I'm an alcoholic"  . Drug Use: No  . Sexually Active: No   Other Topics Concern  . 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   Lives by self, since '78   [name] in [place]     Regular exercise-no   Caffeine Use-no     Constitutional: Denies fever, malaise, fatigue, headache or abrupt weight changes.   Respiratory: Pt reports shortness of breath. Denies difficulty breathing, cough or sputum production.   Cardiovascular: Denies chest pain, chest tightness, palpitations or swelling in the hands or feet.  Neurological: Denies dizziness, difficulty with memory, difficulty with speech or problems with balance and coordination.   No other specific complaints in a complete review of systems (except as listed in HPI above).     Objective:   Physical Exam   BP 120/80  Pulse 105  Temp(Src) 98.4 F (36.9 C) (Oral)  Ht 6\' 2"  (1.88 m)  Wt 289 lb 6 oz (131.26 kg)  BMI 37.14 kg/m2  SpO2 90% Wt Readings from Last 3 Encounters:  12/21/12 289 lb 6 oz (131.26 kg)  12/19/12 280 lb (127.007 kg)  12/06/12 281 lb 6.4 oz (127.642 kg)    General: Appears her stated age, well developed, well nourished in NAD. Cardiovascular: Normal rate and rhythm. S1,S2 noted.  Murmur noted. No rubs or gallops noted. No JVD or BLE edema. No carotid bruits noted. Pulmonary/Chest: Increased effort and positive vesicular breath sounds. No respiratory distress. No wheezes, rales or ronchi noted.  Neurological: Alert and oriented. Cranial nerves II-XII intact. Coordination normal. +DTRs bilaterally.   BMET    Component Value Date/Time   NA 141 12/19/2012 0832   NA 138 11/30/2012 1520   K 4.0 12/19/2012 0832   K 3.7 11/30/2012 1520   CL 106 12/19/2012 0832   CL 102 11/30/2012 1520   CO2 24 12/19/2012 0832   CO2 28 11/30/2012 1520   GLUCOSE 102* 12/19/2012 0832   GLUCOSE 212* 11/30/2012 1520   BUN 11.5 12/19/2012 0832   BUN 12 11/30/2012 1520   CREATININE 1.1 12/19/2012 0832   CREATININE 1.2 11/30/2012 1520   CALCIUM 9.1 12/19/2012 0832   CALCIUM 8.7 11/30/2012 1520   GFRNONAA 74* 07/14/2012 0432   GFRAA 86* 07/14/2012 0432    Lipid Panel     Component Value Date/Time   CHOL 129 11/30/2012 1520   TRIG 113.0 11/30/2012 1520   HDL 41.10 11/30/2012 1520   CHOLHDL 3 11/30/2012 1520   VLDL 22.6 11/30/2012 1520   LDLCALC 65 11/30/2012  1520    CBC    Component Value Date/Time   WBC 5.9 12/19/2012 0832   WBC 5.2 11/30/2012 1520   RBC 3.68* 12/19/2012 0832   RBC 3.93* 11/30/2012 1520  HGB 10.8* 12/19/2012 0832   HGB 11.8* 11/30/2012 1520   HCT 33.2* 12/19/2012 0832   HCT 34.7* 11/30/2012 1520   PLT 198 12/19/2012 0832   PLT 222.0 11/30/2012 1520   MCV 90.2 12/19/2012 0832   MCV 88.4 11/30/2012 1520   MCH 29.3 12/19/2012 0832   MCH 28.9 07/14/2012 0432   MCHC 32.5 12/19/2012 0832   MCHC 33.9 11/30/2012 1520   RDW 15.2* 12/19/2012 0832   RDW 15.2* 11/30/2012 1520   LYMPHSABS 1.0 12/19/2012 0832   LYMPHSABS 7.7* 07/11/2012 0435   MONOABS 0.7 12/19/2012 0832   MONOABS 0.6 07/11/2012 0435   EOSABS 0.9* 12/19/2012 0832   EOSABS 0.6 07/11/2012 0435   BASOSABS 0.0 12/19/2012 0832   BASOSABS 0.0 07/11/2012 0435    Hgb A1C Lab Results  Component Value Date   HGBA1C 11.9* 11/30/2012        Assessment & Plan:   Shortness of breath, recurrent:  Will repeat chest xray today Will get you in to pulmonology for further evaluation Will order stress test

## 2012-12-26 ENCOUNTER — Inpatient Hospital Stay (HOSPITAL_COMMUNITY): Admission: RE | Admit: 2012-12-26 | Payer: Medicare Other | Source: Ambulatory Visit

## 2012-12-28 ENCOUNTER — Telehealth: Payer: Self-pay | Admitting: *Deleted

## 2012-12-28 NOTE — Telephone Encounter (Signed)
Returned call. No answer.  

## 2012-12-28 NOTE — Telephone Encounter (Signed)
Message received that pt left a voicemail on A. Hudson's voicemail that he had a stent and then had pneumonia.  Returned call.  Left message to call back before 4pm.

## 2013-01-01 ENCOUNTER — Ambulatory Visit (HOSPITAL_COMMUNITY): Payer: Medicare Other | Attending: Cardiovascular Disease | Admitting: Radiology

## 2013-01-01 DIAGNOSIS — R0602 Shortness of breath: Secondary | ICD-10-CM

## 2013-01-01 NOTE — Progress Notes (Signed)
Patient is unable to walk for stress echo. Spoke with Dr. Shirlee Latch to change to a dobutamine echo.  Test is scheduled for 6/19/204 at 3pm.  Patient was give instructions by [facility] RN. Aortic stenosis will be evaluate before stress test.

## 2013-01-03 ENCOUNTER — Other Ambulatory Visit (HOSPITAL_COMMUNITY): Payer: Self-pay | Admitting: Internal Medicine

## 2013-01-03 ENCOUNTER — Ambulatory Visit (HOSPITAL_BASED_OUTPATIENT_CLINIC_OR_DEPARTMENT_OTHER): Payer: Medicare Other

## 2013-01-03 ENCOUNTER — Other Ambulatory Visit (HOSPITAL_COMMUNITY): Payer: Self-pay | Admitting: Cardiology

## 2013-01-03 ENCOUNTER — Ambulatory Visit (HOSPITAL_COMMUNITY): Payer: Medicare Other | Attending: Cardiology | Admitting: Radiology

## 2013-01-03 DIAGNOSIS — R0602 Shortness of breath: Secondary | ICD-10-CM | POA: Insufficient documentation

## 2013-01-03 DIAGNOSIS — I2589 Other forms of chronic ischemic heart disease: Secondary | ICD-10-CM

## 2013-01-03 DIAGNOSIS — R0989 Other specified symptoms and signs involving the circulatory and respiratory systems: Secondary | ICD-10-CM

## 2013-01-03 DIAGNOSIS — I509 Heart failure, unspecified: Secondary | ICD-10-CM | POA: Insufficient documentation

## 2013-01-03 MED ORDER — PERFLUTREN PROTEIN A MICROSPH IV SUSP
3.0000 mL | Freq: Once | INTRAVENOUS | Status: AC
  Administered 2013-01-03: 3 mL via INTRAVENOUS

## 2013-01-03 NOTE — Progress Notes (Signed)
Echocardiogram performed. Study changed from Dobutamine to regular echo due to AS and reduced function.

## 2013-01-10 ENCOUNTER — Ambulatory Visit (INDEPENDENT_AMBULATORY_CARE_PROVIDER_SITE_OTHER): Payer: Medicare Other | Admitting: Pulmonary Disease

## 2013-01-10 ENCOUNTER — Ambulatory Visit: Payer: Medicare Other | Admitting: Internal Medicine

## 2013-01-10 ENCOUNTER — Encounter: Payer: Self-pay | Admitting: Pulmonary Disease

## 2013-01-10 ENCOUNTER — Encounter: Payer: Self-pay | Admitting: Internal Medicine

## 2013-01-10 ENCOUNTER — Telehealth: Payer: Self-pay | Admitting: Internal Medicine

## 2013-01-10 VITALS — BP 132/78 | HR 91 | Temp 97.0°F | Ht 72.5 in | Wt 285.0 lb

## 2013-01-10 DIAGNOSIS — R0902 Hypoxemia: Secondary | ICD-10-CM

## 2013-01-10 DIAGNOSIS — R0989 Other specified symptoms and signs involving the circulatory and respiratory systems: Secondary | ICD-10-CM

## 2013-01-10 DIAGNOSIS — R0609 Other forms of dyspnea: Secondary | ICD-10-CM

## 2013-01-10 NOTE — Assessment & Plan Note (Signed)
The patient was surprisingly found to not have obstructive lung disease, did have significant restriction and a decrease in his diffusion capacity.  It is unclear whether his restriction is due to his centripetal obesity, or whether he may have occult interstitial lung disease after his chemotherapy.  He has an upcoming CT chest by his oncologist, and I will add high resolution cuts to the order.  He also has on his repeat echo worsening ejection fraction, and elevated pulmonary artery pressures that could be pre-or post capillary.  He also has known valvular heart disease, and I wonder if this could be part of the issue as well.  He has significant lower extremity edema, and appears to be volume overloaded.  I will need input from cardiology regarding this, and whether he may benefit from a right heart catheterization.  The patient would like to establish with [facility] cardiology, and change from his current cardiologist.

## 2013-01-10 NOTE — Telephone Encounter (Signed)
Pt came in upset that he had appt this am, states he did not sch it and that he was not paying the $50. I spoke with Truddie Hidden and she said just to cancel appt. Pt was made aware appt was cancelled and there would be no charge. He said [place] referred him to see Dr Rennis Golden, he does not want to see him. He has appt with Dr. Melburn Popper. Wants all appts with Dr. Rennis Golden to be cancelled. The patient was very upset.

## 2013-01-10 NOTE — Progress Notes (Signed)
  Subjective:    Patient ID: [name] [name], male    DOB: [birth year], 68 y.o.   MRN: [id number]  HPI The patient comes in today for followup after his pulmonary function studies and echo, as part of a workup for dyspnea on exertion and chronic respiratory failure.  Surprisingly, he was found to have no significant airflow obstruction, but did have severe restriction.  His diffusion capacity was also severely reduced.  The patient's echocardiogram showed a decline in his ejection fraction to 35-40% from his prior study last year.  He continued to have moderate aortic valvular disease, and his pulmonary artery pressures were estimated at 62 mm.  I have reviewed all of the studies with the patient, and answered all of his questions.  He has seen an improvement in his shortness of breath since wearing exertional oxygen.   Review of Systems  Constitutional: Negative for fever and unexpected weight change.  HENT: Negative for ear pain, nosebleeds, congestion, sore throat, rhinorrhea, sneezing, trouble swallowing, dental problem, postnasal drip and sinus pressure.   Eyes: Negative for redness and itching.  Respiratory: Positive for shortness of breath. Negative for cough, chest tightness and wheezing.   Cardiovascular: Positive for leg swelling ( since sunday--x4 days). Negative for palpitations.  Gastrointestinal: Negative for nausea and vomiting.  Genitourinary: Negative for dysuria.  Musculoskeletal: Negative for joint swelling.  Skin: Negative for rash.  Neurological: Negative for headaches.  Hematological: Does not bruise/bleed easily.  Psychiatric/Behavioral: Negative for dysphoric mood. The patient is not nervous/anxious.        Objective:   Physical Exam Obese male in no acute distress Nose without purulent discharge noted Neck without lymphadenopathy or thyromegaly Lower extremities with edema noted, no cyanosis Alert and oriented, moves all 4 extremities.       Assessment & Plan:

## 2013-01-10 NOTE — Patient Instructions (Addendum)
Stay on your oxygen Will review your ct scan of your chest when done at the end of the month, and let you know the results.  Will refer you to [facility] cardiology for further evaluation. Work on [employer] loss Will arrange followup with me once your scan is reviewed.

## 2013-01-10 NOTE — Progress Notes (Signed)
PFT done today. 

## 2013-01-11 ENCOUNTER — Ambulatory Visit (INDEPENDENT_AMBULATORY_CARE_PROVIDER_SITE_OTHER): Payer: Medicare Other | Admitting: Cardiovascular Disease

## 2013-01-11 ENCOUNTER — Encounter: Payer: Self-pay | Admitting: Cardiovascular Disease

## 2013-01-11 VITALS — BP 146/89 | HR 97 | Ht 72.5 in | Wt 300.8 lb

## 2013-01-11 DIAGNOSIS — I509 Heart failure, unspecified: Secondary | ICD-10-CM

## 2013-01-11 DIAGNOSIS — C8590 Non-Hodgkin lymphoma, unspecified, unspecified site: Secondary | ICD-10-CM

## 2013-01-11 DIAGNOSIS — E119 Type 2 diabetes mellitus without complications: Secondary | ICD-10-CM

## 2013-01-11 DIAGNOSIS — I10 Essential (primary) hypertension: Secondary | ICD-10-CM

## 2013-01-11 DIAGNOSIS — I5042 Chronic combined systolic (congestive) and diastolic (congestive) heart failure: Secondary | ICD-10-CM

## 2013-01-11 DIAGNOSIS — C8589 Other specified types of non-Hodgkin lymphoma, extranodal and solid organ sites: Secondary | ICD-10-CM

## 2013-01-11 MED ORDER — TORSEMIDE 20 MG PO TABS: 20.0000 mg | ORAL_TABLET | Freq: Two times a day (BID) | ORAL | Status: DC

## 2013-01-11 MED ORDER — POTASSIUM CHLORIDE ER 10 MEQ PO TBCR: 10.0000 meq | EXTENDED_RELEASE_TABLET | Freq: Two times a day (BID) | ORAL | Status: DC

## 2013-01-11 NOTE — Patient Instructions (Addendum)
Your physician has recommended you make the following change in your medication:   STOP FUROSEMIDE/ LASIX START DEMADEX 20 MG TWICE DAILY TAKE ONE WHEN YOU GET UP THE NEXT DOSE AT 2 PM ( TONIGHT TAKE TWO TABLETS WITH EXTRA POTASSIUM)  START POTASSIUM/ KDUR 10MG  TWICE DAILY WITH THE [facility]  Your physician recommends that you schedule a follow-up appointment in: 1-2 WEEKS    CALL ME/ JODETTE RN ON Monday 6/30  [phone]

## 2013-01-11 NOTE — Progress Notes (Signed)
[name] [name] Date of Birth  [birth year]       [facility] Office [address], [address]  [address] [place], [place]  [address]   [name] [name], [place]  [address] [phone]     [phone]   Fax  [phone]    Fax [phone]  Problem List: 1. Coronary artery disease 2. Ischemic cardiopathy with an ejection fraction of around 35% 3. Aortic stenosis 4.  Non hodgekins lymphoma 5. Type 2 diabetes mellitus 6.  Sleep apnea    History of Present Illness:  [name] [name] is a 67 yo with hx of CAD, CHF.  He has been seen at [facility] for the past year.  He has had progressive dyspnea for the past 4 months.   He has had increased swelling in his legs and ankles.  He has not been able to get back in with [facility] and wanted to come here for further evaluation.    He had 2 stents placed in Oct. 2013.  He did well for 6 months.  Then his condition started to decline - porgressive , severe DOE with any exertion.  Echo recently showed. Left ventricle: The cavity size was mildly dilated. Wall thickness was normal. Systolic function was mildly to  moderately reduced. The estimated ejection fraction was in the range of 40% to 45%. There is akinesis of the posterior and distal lateral myocardium; inferior hypokinesis. Doppler parameters are consistent with abnormal left ventricular relaxation (grade 1 diastolic dysfunction). - Mitral valve: Mild regurgitation. - Left atrium: The atrium was mildly dilated. PA pressures of 62.   Current Outpatient Prescriptions on File Prior to Visit  Medication Sig Dispense Refill  . Alum & Mag Hydroxide-Simeth (GI COCKTAIL) SUSP suspension Take 30 mLs by mouth 3 (three) times daily as needed for indigestion. Shake well.  300 mL  0  . aspirin EC 81 MG EC tablet Take 1 tablet (81 mg total) by mouth daily.      Marland Kitchen atorvastatin (LIPITOR) 20 MG tablet Take 1 tablet (20 mg total) by mouth daily at 6 PM.  30 tablet  5  .  carvedilol (COREG CR) 10 MG 24 hr capsule Take 3.25 mg by mouth 2 (two) times daily.       . furosemide (LASIX) 20 MG tablet Take 20 mg by mouth daily.      Marland Kitchen glipiZIDE (GLUCOTROL) 10 MG tablet Take 1 tablet (10 mg total) by mouth 2 (two) times daily before a meal.  60 tablet  3  . lisinopril (PRINIVIL,ZESTRIL) 5 MG tablet TAKE ONE TABLET BY MOUTH EVERY DAY  30 tablet  11  . metFORMIN (GLUCOPHAGE) 500 MG tablet Take 1,000 mg by mouth 2 (two) times daily with a meal.      . nicotine (NICODERM CQ - DOSED IN MG/24 HOURS) 14 mg/24hr patch Place 1 patch onto the skin daily.      . ondansetron (ZOFRAN) 8 MG tablet Take 8 mg by mouth every 8 (eight) hours as needed for nausea.      Marland Kitchen oxymetazoline (AFRIN) 0.05 % nasal spray Place 2 sprays into the nose 2 (two) times daily as needed. For nasal congestion      . potassium chloride (K-DUR) 10 MEQ tablet Take 10 mg by mouth daily.        No current facility-administered medications on file prior to visit.    No Known Allergies  Past Medical History  Diagnosis Date  .  Ischemic cardiomyopathy   . Type II diabetes mellitus   . Hypertension   . Hypercholesterolemia   . Heart murmur   . Shortness of breath     "all the time" (06/06/2012)  . Lower GI bleeding 1980's    "when I was drinking alot" (06/06/2012)  . Arthritis     "both shoulders" (06/06/2012)  . Chronic combined systolic and diastolic CHF (congestive heart failure) 07/10/2012  . GERD (gastroesophageal reflux disease) 07/12/2012  . OSA on CPAP     uses a cpap  . Myocardial infarction 11/13  . Coronary artery disease   . Lymphoma   . History of alcoholism   . Sleep apnea   . Aortic stenosis     Past Surgical History  Procedure Laterality Date  . Coronary angioplasty with stent placement  06/06/2012    "1"  . Lymph gland excision  07/13/2012    Procedure: CERVICAL LYMPH GLAND EXCISION;  Surgeon: Currie Paris, MD;  Location: [facility] OR;  Service: General;  Laterality: Right;  .  Portacath placement  07/30/2012    Procedure: INSERTION PORT-A-CATH;  Surgeon: Currie Paris, MD;  Location: [facility];  Service: General;  Laterality: Right;    History  Smoking status  . Former Smoker -- 1.00 packs/day for 53 years  . Types: Cigarettes  . Quit date: 07/19/2011  Smokeless tobacco  . Never Used    Comment: pt states he does not smoke as long as he uses patch    History  Alcohol Use No    Comment: 06/06/2012 "haven't had a drink in 22 years; I'm an alcoholic"    Family History  Problem Relation Age of Onset  . Osteoarthritis Mother   . Osteoarthritis Father   . Colon cancer Daughter   . Diabetes Father     entire family  . Heart disease Father     Reviw of Systems:  Reviewed in the HPI.  All other systems are negative.  Physical Exam: Blood pressure 146/89, pulse 97, height 6' 0.5" (1.842 m), weight 300 lb 12.8 oz (136.442 kg). General: Well developed, well nourished, in no acute distress.  Head: Normocephalic, atraumatic, sclera non-icteric, mucus membranes are moist,   Neck: Supple. Carotids are 2 + without bruits. JVD 10-14 cm  Lungs: Clear   Heart: RR, 2-3/6 systolic murmur, no S3 heard today  Abdomen: Soft, non-tender, slightly edematous  Msk:  Strength and tone are normal   Extremities: No clubbing or cyanosis. 2+ edema, legs are tense  Neuro: CN II - XII intact.  Alert and oriented X 3.   Psych:  Normal   ECG: Jul 31, 2012:  NSR,   Assessment / Plan:

## 2013-01-11 NOTE — Assessment & Plan Note (Addendum)
Presents with acute worsening of his combined systolic and diastolic congestive heart failure. He's on a very low dose of Lasix and he has commented that the Lasix does not seem to be working. He generally tries to avoid salt but did eat a little bit of extra salt he chewed when he went to [place] last week.  At this point I think it his abdominal edema may limit the effectiveness of her furosemide. We will discontinue the furosemide and start him on torsemide 20 mg twice a day. We will also increase his potassium to 10 mEq twice a day.  He'll take torsemide 40 mg tonight as well as an extra potassium tablet-10 mEq.  Instructed him to pay attention to his urine out put. If it 20 mg a torsemide does not cause him to have a brisk diuresis then he is to take 40 mg the next dose.  We will be in touch with him early next week to make sure that he is diuresing.  He has mild aortic stenosis but I do not think that this is causing his heart failure symptoms. He has a known ischemic cardiopathy  .  Has a history of coronary artery disease the symptoms do not sound like ischemia at this point. We'll ultimately may need to repeat his cardiac catheterization but we need to get  his volume status optimized first.

## 2013-01-14 ENCOUNTER — Telehealth: Payer: Self-pay | Admitting: Cardiovascular Disease

## 2013-01-14 NOTE — Telephone Encounter (Signed)
New Problem:    Patient called in stating that he was supposed to call in this morning.  Please call back.

## 2013-01-14 NOTE — Telephone Encounter (Signed)
Pt states he is feeling so much better, he does not need o2 anymore and his leg/ groin edema is so much better. Pt was reminded of high sodium foods to avoid, told to call with questions or concerns and next app date / time reviewed.

## 2013-01-22 ENCOUNTER — Telehealth: Payer: Self-pay | Admitting: Pulmonary Disease

## 2013-01-22 ENCOUNTER — Encounter: Payer: Self-pay | Admitting: Pulmonary Disease

## 2013-01-22 NOTE — Telephone Encounter (Signed)
Please let pt know that his oxygen level still falls despite cpap when off oxygen. Needs to wear his oxygen at night with his cpap at 2 liters.

## 2013-01-23 ENCOUNTER — Encounter: Payer: Self-pay | Admitting: Cardiovascular Disease

## 2013-01-23 ENCOUNTER — Ambulatory Visit (INDEPENDENT_AMBULATORY_CARE_PROVIDER_SITE_OTHER): Payer: Medicare Other | Admitting: Cardiovascular Disease

## 2013-01-23 VITALS — BP 117/73 | HR 92 | Ht 72.5 in | Wt 283.0 lb

## 2013-01-23 DIAGNOSIS — R0602 Shortness of breath: Secondary | ICD-10-CM

## 2013-01-23 DIAGNOSIS — I509 Heart failure, unspecified: Secondary | ICD-10-CM

## 2013-01-23 DIAGNOSIS — I5042 Chronic combined systolic (congestive) and diastolic (congestive) heart failure: Secondary | ICD-10-CM

## 2013-01-23 LAB — BASIC METABOLIC PANEL
BUN: 19 mg/dL (ref 6–23)
Calcium: 9.5 mg/dL (ref 8.4–10.5)
Creatinine, Ser: 1.2 mg/dL (ref 0.4–1.5)
GFR: 79.36 mL/min (ref >60.00)

## 2013-01-23 LAB — BRAIN NATRIURETIC PEPTIDE: Pro B Natriuretic peptide (BNP): 282 pg/mL — ABNORMAL HIGH (ref 0.0–100.0)

## 2013-01-23 MED ORDER — CARVEDILOL 6.25 MG PO TABS: 6.2500 mg | ORAL_TABLET | Freq: Two times a day (BID) | ORAL | Status: DC

## 2013-01-23 NOTE — Assessment & Plan Note (Addendum)
[name] [name] is doing better. He has responded well the the demadex instead of the Lasix. We still have a way to go.   He still eats salt - likes [place] food.  Will reinforce salt restriction.  Will increase coreg to 6.25 BID.  We have discussed that fact that he may have some fatigue after increasing the dose.   Will check BMP today. I will see him in 1 month for OV and bmp. \ He has requested a handicap placard.

## 2013-01-23 NOTE — Progress Notes (Signed)
[name] [name] Date of Birth  1945/09/02       [facility] Office [address], [address]  [address], [address] [place], [place]  [address]   [place], [place]  [address] [phone]     [phone]   Fax  [phone]    Fax [phone]  Problem List: 1. Coronary artery disease 2. Ischemic cardiopathy with an ejection fraction of around 35% 3. Aortic stenosis 4.  Non hodgekins lymphoma 5. Type 2 diabetes mellitus 6.  Sleep apnea   History of Present Illness:  [name] [name] is a 67 yo with hx of CAD, CHF.  He has been seen at [facility] for the past year.  He has had progressive dyspnea for the past 4 months.   He has had increased swelling in his legs and ankles.  He has not been able to get back in with [facility] and wanted to come here for further evaluation.    He had 2 stents placed in Oct. 2013.  He did well for 6 months.  Then his condition started to decline - porgressive , severe DOE with any exertion.  Echo recently showed. Left ventricle: The cavity size was mildly dilated. Wall thickness was normal. Systolic function was mildly to  moderately reduced. The estimated ejection fraction was in the range of 40% to 45%. There is akinesis of the posterior and distal lateral myocardium; inferior hypokinesis. Doppler parameters are consistent with abnormal left ventricular relaxation (grade 1 diastolic dysfunction). - Mitral valve: Mild regurgitation. - Left atrium: The atrium was mildly dilated. PA pressures of 62.  January 23, 2013: [name] [name] is doing much better.  We started him on torsemide and he has diuresed quite nicely.  He has lost 17 pounds in the past 2 weeks.  He is still dyspneic with exertion.  He is trying to avoid salt but is having some challenges. No CP.   No PND.  No significant edema.   Current Outpatient Prescriptions on File Prior to Visit  Medication Sig Dispense Refill  . Alum & Mag Hydroxide-Simeth (GI COCKTAIL) SUSP  suspension Take 30 mLs by mouth 3 (three) times daily as needed for indigestion. Shake well.  300 mL  0  . aspirin EC 81 MG EC tablet Take 1 tablet (81 mg total) by mouth daily.      Marland Kitchen atorvastatin (LIPITOR) 20 MG tablet Take 1 tablet (20 mg total) by mouth daily at 6 PM.  30 tablet  5  . carvedilol (COREG) 3.125 MG tablet Take 1 tablet (3.125 mg total) by mouth 2 (two) times daily.  180 tablet  3  . glipiZIDE (GLUCOTROL) 10 MG tablet Take 1 tablet (10 mg total) by mouth 2 (two) times daily before a meal.  60 tablet  3  . lisinopril (PRINIVIL,ZESTRIL) 5 MG tablet TAKE ONE TABLET BY MOUTH EVERY DAY  30 tablet  11  . metFORMIN (GLUCOPHAGE) 500 MG tablet Take 1,000 mg by mouth 2 (two) times daily with a meal.      . nicotine (NICODERM CQ - DOSED IN MG/24 HOURS) 14 mg/24hr patch Place 1 patch onto the skin daily.      . ondansetron (ZOFRAN) 8 MG tablet Take 8 mg by mouth every 8 (eight) hours as needed for nausea.      Marland Kitchen oxymetazoline (AFRIN) 0.05 % nasal spray Place 2 sprays into the nose 2 (two) times daily as needed. For nasal congestion      .  potassium chloride (K-DUR) 10 MEQ tablet Take 1 tablet (10 mEq total) by mouth 2 (two) times daily.  60 tablet  5  . torsemide (DEMADEX) 20 MG tablet Take 1 tablet (20 mg total) by mouth 2 (two) times daily.  60 tablet  5   No current facility-administered medications on file prior to visit.    No Known Allergies  Past Medical History  Diagnosis Date  . Ischemic cardiomyopathy   . Type II diabetes mellitus   . Hypertension   . Hypercholesterolemia   . Heart murmur   . Shortness of breath     "all the time" (06/06/2012)  . Lower GI bleeding 1980's    "when I was drinking alot" (06/06/2012)  . Arthritis     "both shoulders" (06/06/2012)  . Chronic combined systolic and diastolic CHF (congestive heart failure) 07/10/2012  . GERD (gastroesophageal reflux disease) 07/12/2012  . OSA on CPAP     uses a cpap  . Myocardial infarction 11/13  .  Coronary artery disease   . Lymphoma   . History of alcoholism   . Sleep apnea   . Aortic stenosis     Past Surgical History  Procedure Laterality Date  . Coronary angioplasty with stent placement  06/06/2012    "1"  . Lymph gland excision  07/13/2012    Procedure: CERVICAL LYMPH GLAND EXCISION;  Surgeon: Currie Paris, MD;  Location: [facility] OR;  Service: General;  Laterality: Right;  . Portacath placement  07/30/2012    Procedure: INSERTION PORT-A-CATH;  Surgeon: Currie Paris, MD;  Location: [facility];  Service: General;  Laterality: Right;    History  Smoking status  . Former Smoker -- 1.00 packs/day for 53 years  . Types: Cigarettes  . Quit date: 07/19/2011  Smokeless tobacco  . Never Used    Comment: pt states he does not smoke as long as he uses patch    History  Alcohol Use No    Comment: 06/06/2012 "haven't had a drink in 22 years; I'm an alcoholic"    Family History  Problem Relation Age of Onset  . Osteoarthritis Mother   . Osteoarthritis Father   . Colon cancer Daughter   . Diabetes Father     entire family  . Heart disease Father     Reviw of Systems:  Reviewed in the HPI.  All other systems are negative.  Physical Exam: Blood pressure 117/73, pulse 92, height 6' 0.5" (1.842 m), weight 283 lb (128.368 kg). General: Well developed, well nourished, in no acute distress.  Head: Normocephalic, atraumatic, sclera non-icteric, mucus membranes are moist,   Neck: Supple. Carotids are 2 + without bruits. JVD 10-14 cm  Lungs: Clear   Heart: RR, 2-3/6 systolic murmur, no S3 heard today  Abdomen: Soft, non-tender, slightly edematous  Msk:  Strength and tone are normal   Extremities: No clubbing or cyanosis. No  edema, his leg edema is markedly better   Neuro: CN II - XII intact.  Alert and oriented X 3.   Psych:  Normal   ECG: Jul 31, 2012:  NSR,   Assessment / Plan:

## 2013-01-23 NOTE — Patient Instructions (Addendum)
Your physician recommends that you return for lab work in: BMET/ BNP  Your physician recommends that you schedule a follow-up appointment in: 1 MONTH  Your physician has recommended you make the following change in your medication:  INCREASE COREG/ CARVEDILOL TO 6.25 MG TWICE DAILY 12 HOURS APART   IT IS VERY IMPORTANT TO AVOID SALT// PLEASE READ BOOKLET, LIVING BETTER WITH HEART FAILURE GIVEN TO YOU TODAY REDUCE HIGH SODIUM FOODS LIKE CANNED SOUP, GRAVY, SAUCES, READY PREPARED FOODS LIKE FROZEN FOODS; LEAN CUISINE, LASAGNA. BACON, SAUSAGE, LUNCH MEAT, FAST FOODS, HOT DOGS, CHIPS, PIZZA. CHINESE FOOD/

## 2013-01-24 NOTE — Telephone Encounter (Signed)
Results have been explained to patient, pt expressed understanding.  Pt would like to know if he needs to keep follow up appt 7/21. Pt states that his breathing is doing so much better and he is not having to use his O2 hardly at all during the day.  Please advise Dr Shelle Iron. Thanks.

## 2013-01-25 NOTE — Telephone Encounter (Signed)
Called and lmom for the pt to make him aware of KC recs. Nothing further is needed.

## 2013-01-25 NOTE — Telephone Encounter (Signed)
Ok to cancel that apptm, but he is to call me when his followup ct chest is done so i can review and setup a followup visit down the road.

## 2013-01-28 NOTE — Telephone Encounter (Signed)
lmtcb

## 2013-01-28 NOTE — Telephone Encounter (Signed)
New Problem  Pt states that his feet are swollen.  He wants to know what he needs to do.

## 2013-01-29 ENCOUNTER — Ambulatory Visit (HOSPITAL_BASED_OUTPATIENT_CLINIC_OR_DEPARTMENT_OTHER): Payer: Medicare Other

## 2013-01-29 ENCOUNTER — Other Ambulatory Visit (HOSPITAL_BASED_OUTPATIENT_CLINIC_OR_DEPARTMENT_OTHER): Payer: Medicare Other | Admitting: Lab

## 2013-01-29 ENCOUNTER — Telehealth: Payer: Self-pay | Admitting: Pulmonary Disease

## 2013-01-29 ENCOUNTER — Encounter (HOSPITAL_COMMUNITY): Payer: Self-pay

## 2013-01-29 ENCOUNTER — Ambulatory Visit (HOSPITAL_COMMUNITY)
Admission: RE | Admit: 2013-01-29 | Discharge: 2013-01-29 | Disposition: A | Discharge status: Home / Self Care | Payer: Medicare Other | Source: Ambulatory Visit | Attending: Oncology | Admitting: Oncology

## 2013-01-29 VITALS — BP 121/72 | HR 88 | Temp 97.0°F | Resp 20

## 2013-01-29 DIAGNOSIS — C8590 Non-Hodgkin lymphoma, unspecified, unspecified site: Secondary | ICD-10-CM

## 2013-01-29 DIAGNOSIS — R599 Enlarged lymph nodes, unspecified: Secondary | ICD-10-CM | POA: Insufficient documentation

## 2013-01-29 DIAGNOSIS — C8589 Other specified types of non-Hodgkin lymphoma, extranodal and solid organ sites: Secondary | ICD-10-CM | POA: Insufficient documentation

## 2013-01-29 LAB — CBC WITH DIFFERENTIAL/PLATELET
BASO%: 1.1 % (ref 0.0–2.0)
EOS%: 8.9 % — ABNORMAL HIGH (ref 0.0–7.0)
HCT: 32.2 % — ABNORMAL LOW (ref 38.4–49.9)
MCH: 31.4 pg (ref 27.2–33.4)
MCHC: 35.2 g/dL (ref 32.0–36.0)
NEUT%: 62.6 % (ref 39.0–75.0)
RDW: 14.9 % — ABNORMAL HIGH (ref 11.0–14.6)
lymph#: 1.1 10*3/uL (ref 0.9–3.3)

## 2013-01-29 LAB — COMPREHENSIVE METABOLIC PANEL (CC13)
ALT: 13 U/L (ref 0–55)
AST: 16 U/L (ref 5–34)
Calcium: 9.5 mg/dL (ref 8.4–10.4)
Chloride: 98 mEq/L (ref 98–109)
Creatinine: 1.2 mg/dL (ref 0.7–1.3)

## 2013-01-29 MED ORDER — IOHEXOL 300 MG/ML  SOLN
100.0000 mL | Freq: Once | INTRAMUSCULAR | Status: AC | PRN
  Administered 2013-01-29: 100 mL via INTRAVENOUS

## 2013-01-29 MED ORDER — HEPARIN SOD (PORK) LOCK FLUSH 100 UNIT/ML IV SOLN
500.0000 [IU] | Freq: Once | INTRAVENOUS | Status: AC
  Administered 2013-01-29: 500 [IU] via INTRAVENOUS
  Filled 2013-01-29: qty 5

## 2013-01-29 MED ORDER — SODIUM CHLORIDE 0.9 % IJ SOLN
10.0000 mL | INTRAMUSCULAR | Status: DC | PRN
  Administered 2013-01-29: 10 mL via INTRAVENOUS
  Filled 2013-01-29: qty 10

## 2013-01-29 NOTE — Progress Notes (Signed)
Patient has an appointment in [facility] - CT today. Port accessed for CT scan.

## 2013-01-29 NOTE — Telephone Encounter (Signed)
Pt had CT done today. Please advise KC thanks

## 2013-01-29 NOTE — Patient Instructions (Addendum)
Implanted Port Instructions  An implanted port is a central line that has a round shape and is placed under the skin. It is used for long-term IV (intravenous) access for:  · Medicine.  · Fluids.  · Liquid nutrition, such as TPN (total parenteral nutrition).  · Blood samples.  Ports can be placed:  · In the chest area just below the collarbone (this is the most common place.)  · In the arms.  · In the belly (abdomen) area.  · In the legs.  PARTS OF THE PORT  A port has 2 main parts:  · The reservoir. The reservoir is round, disc-shaped, and will be a small, raised area under your skin.  · The reservoir is the part where a needle is inserted (accessed) to either give medicines or to draw blood.  · The catheter. The catheter is a long, slender tube that extends from the reservoir. The catheter is placed into a large vein.  · Medicine that is inserted into the reservoir goes into the catheter and then into the vein.  INSERTION OF THE PORT  · The port is surgically placed in either an operating room or in a procedural area (interventional radiology).  · Medicine may be given to help you relax during the procedure.  · The skin where the port will be inserted is numbed (local anesthetic).  · 1 or 2 small cuts (incisions) will be made in the skin to insert the port.  · The port can be used after it has been inserted.  INCISION SITE CARE  · The incision site may have small adhesive strips on it. This helps keep the incision site closed. Sometimes, no adhesive strips are placed. Instead of adhesive strips, a special kind of surgical glue is used to keep the incision closed.  · If adhesive strips were placed on the incision sites, do not take them off. They will fall off on their own.  · The incision site may be sore for 1 to 2 days. Pain medicine can help.  · Do not get the incision site wet. Bathe or shower as directed by your caregiver.  · The incision site should heal in 5 to 7 days. A small scar may form after the  incision has healed.  ACCESSING THE PORT  Special steps must be taken to access the port:  · Before the port is accessed, a numbing cream can be placed on the skin. This helps numb the skin over the port site.  · A sterile technique is used to access the port.  · The port is accessed with a needle. Only "non-coring" port needles should be used to access the port. Once the port is accessed, a blood return should be checked. This helps ensure the port is in the vein and is not clogged (clotted).  · If your caregiver believes your port should remain accessed, a clear (transparent) bandage will be placed over the needle site. The bandage and needle will need to be changed every week or as directed by your caregiver.  · Keep the bandage covering the needle clean and dry. Do not get it wet. Follow your caregiver's instructions on how to take a shower or bath when the port is accessed.  · If your port does not need to stay accessed, no bandage is needed over the port.  FLUSHING THE PORT  Flushing the port keeps it from getting clogged. How often the port is flushed depends on:  · If a   constant infusion is running. If a constant infusion is running, the port may not need to be flushed.  · If intermittent medicines are given.  · If the port is not being used.  For intermittent medicines:  · The port will need to be flushed:  · After medicines have been given.  · After blood has been drawn.  · As part of routine maintenance.  · A port is normally flushed with:  · Normal saline.  · Heparin.  · Follow your caregiver's advice on how often, how much, and the type of flush to use on your port.  IMPORTANT PORT INFORMATION  · Tell your caregiver if you are allergic to heparin.  · After your port is placed, you will get a manufacturer's information card. The card has information about your port. Keep this card with you at all times.  · There are many types of ports available. Know what kind of port you have.  · In case of an  emergency, it may be helpful to wear a medical alert bracelet. This can help alert health care workers that you have a port.  · The port can stay in for as long as your caregiver believes it is necessary.  · When it is time for the port to come out, surgery will be done to remove it. The surgery will be similar to how the port was put in.  · If you are in the hospital or clinic:  · Your port will be taken care of and flushed by a nurse.  · If you are at home:  · A home health care nurse may give medicines and take care of the port.  · You or a family member can get special training and directions for giving medicine and taking care of the port at home.  SEEK IMMEDIATE MEDICAL CARE IF:   · Your port does not flush or you are unable to get a blood return.  · New drainage or pus is coming from the incision.  · A bad smell is coming from the incision site.  · You develop swelling or increased redness at the incision site.  · You develop increased swelling or pain at the port site.  · You develop swelling or pain in the surrounding skin near the port.  · You have an oral temperature above 102° F (38.9° C), not controlled by medicine.  MAKE SURE YOU:   · Understand these instructions.  · Will watch your condition.  · Will get help right away if you are not doing well or get worse.  Document Released: 07/04/2005 Document Revised: 09/26/2011 Document Reviewed: 09/25/2008  [employer]® Patient Information ©2014 [employer].

## 2013-01-29 NOTE — Telephone Encounter (Signed)
Note sent to my primary nurse.

## 2013-01-30 NOTE — Telephone Encounter (Signed)
I spoke with patient about results and he verbalized understanding and had no questions. Pt will keep scheduled appt. Nothing further was needed

## 2013-01-30 NOTE — Telephone Encounter (Signed)
LEFT MSG TO CALL BACK X 2

## 2013-01-30 NOTE — Telephone Encounter (Signed)
Follow Up     Pt calling to speak to nurse regarding medication changes. Please call.

## 2013-01-30 NOTE — Telephone Encounter (Signed)
Pt called and scheduled appt on 01/31/13 @ 9:15 per message.  Leanora Ivanoff

## 2013-01-30 NOTE — Telephone Encounter (Signed)
LMTCB

## 2013-01-30 NOTE — Telephone Encounter (Signed)
PT states his swelling is better. High sodium foods reviewed and seems to be compliant but some confusion with diet. He was avoiding meat but I redirected him, he didn't need to avoid meat- he needs to cook at home to avoid salt. He was told to weigh daily and 3-5 lb rule and to call if wt increases. He agreed to plan.

## 2013-01-30 NOTE — Telephone Encounter (Signed)
Per results:  Result Notes    Notes Recorded by Barbaraann Share, MD on 01/29/2013 at 2:58 PM Please let pt know that LN in chest and abd are all stable. Good news. He has no spots in lungs. He does have one finding that I need to discuss with him, and would like to see him in office this or next week to review. It is not a big deal, nothing for him to worry about, just needs further explanation.   --------  Ashtyn, pls advise when pt can be worked in with [facility] before we call him.  Thank you.

## 2013-01-30 NOTE — Telephone Encounter (Signed)
LMOM x 1  Left detailed msg on machine to return call for results and appt  KC has open appt slot 7/17 at 915. I have put this appt on HOLD to be used by this pt. Will HOLD until lunch time for pt.

## 2013-01-31 ENCOUNTER — Ambulatory Visit (HOSPITAL_BASED_OUTPATIENT_CLINIC_OR_DEPARTMENT_OTHER): Payer: Medicare Other | Admitting: Oncology

## 2013-01-31 ENCOUNTER — Ambulatory Visit (INDEPENDENT_AMBULATORY_CARE_PROVIDER_SITE_OTHER): Payer: Medicare Other | Admitting: Pulmonary Disease

## 2013-01-31 ENCOUNTER — Telehealth: Payer: Self-pay | Admitting: Oncology

## 2013-01-31 ENCOUNTER — Encounter: Payer: Self-pay | Admitting: Pulmonary Disease

## 2013-01-31 VITALS — BP 132/82 | HR 86 | Temp 97.3°F | Ht 72.5 in | Wt 286.6 lb

## 2013-01-31 VITALS — BP 125/64 | HR 95 | Temp 97.1°F | Resp 18 | Ht 72.5 in | Wt 285.7 lb

## 2013-01-31 DIAGNOSIS — R0609 Other forms of dyspnea: Secondary | ICD-10-CM

## 2013-01-31 DIAGNOSIS — C8590 Non-Hodgkin lymphoma, unspecified, unspecified site: Secondary | ICD-10-CM

## 2013-01-31 NOTE — Telephone Encounter (Signed)
gv and printed appt sched and avs for pt  °

## 2013-01-31 NOTE — Progress Notes (Signed)
  Subjective:    Patient ID: [name] [name], male    DOB: Dec 03, 1945, 67 y.o.   MRN: [id number]  HPI Patient comes in today for followup after his recent CT chest.  He had no evidence for interstitial lung disease, but did have some mosaic perfusion abnormalities noted.  He has had pulmonary function studies that showed no significant airflow obstruction, but does have a long history of tobacco abuse.  He has been seen by the cardiologists since his last visit, and with changes to his meds has seen a significant improvement in his breathing and decrease in his edema.  He still has dyspnea on exertion, but it is greatly improved.   Review of Systems  Constitutional: Negative for fever and unexpected weight change.  HENT: Negative for ear pain, nosebleeds, congestion, sore throat, rhinorrhea, sneezing, trouble swallowing, dental problem, postnasal drip and sinus pressure.   Eyes: Negative for redness and itching.  Respiratory: Negative for cough, chest tightness, shortness of breath and wheezing.   Cardiovascular: Negative for palpitations and leg swelling.  Gastrointestinal: Negative for nausea and vomiting.  Genitourinary: Negative for dysuria.  Musculoskeletal: Negative for joint swelling.  Skin: Negative for rash.  Neurological: Negative for headaches.  Hematological: Does not bruise/bleed easily.  Psychiatric/Behavioral: Negative for dysphoric mood. The patient is not nervous/anxious.        Objective:   Physical Exam Morbidly obese male in no acute distress Nose without purulent discharge noted Neck without lymphadenopathy or thyromegaly Chest with a few basal crackles otherwise clear Cardiac exam with regular rate and rhythm Lower extremities with mild edema, no cyanosis Alert and oriented, moves all 4 extremities.       Assessment & Plan:

## 2013-01-31 NOTE — Progress Notes (Signed)
Hematology and Oncology Follow Up Visit  [name] [name] [id number] [birth year] 67 y.o. 01/31/2013 8:58 AM   Principle Diagnosis: 67 year old with stage IIIA SLL diagnosed in 06/2012. He presented with abdominal pain and diffuse lymphadenopathy.   Prior Therapy:  He is S/P lymph node biopsy that was done on 07/13/2012.  He is S/P 6 cycles of B-R started in 07/2012. He completed cycle 6 on 12/2012.    Current therapy: Observation and follow up.   Interim History: [name] [name] presents today for a follow up visit. He is a nice man with the above diagnosis. He presented with bulky adenopathy above and below the diaphragm. He tolerated chemotherapy without complications. He continues to reports intermittent dyspnea with activities. He reports no fevers or chills. No nausea or vomiting. His abdominal pain has resolved. He is able to eat without any problems. He is not reporting any constipation, nausea, diarrhea, or abdominal pain. He resumed most activities of daily living without any hindrance or decline.  Medications: I have reviewed the patient's current medications. Current Outpatient Prescriptions  Medication Sig Dispense Refill  . Alum & Mag Hydroxide-Simeth (GI COCKTAIL) SUSP suspension Take 30 mLs by mouth 3 (three) times daily as needed for indigestion. Shake well.  300 mL  0  . aspirin EC 81 MG EC tablet Take 1 tablet (81 mg total) by mouth daily.      Marland Kitchen atorvastatin (LIPITOR) 20 MG tablet Take 1 tablet (20 mg total) by mouth daily at 6 PM.  30 tablet  5  . carvedilol (COREG) 6.25 MG tablet Take 1 tablet (6.25 mg total) by mouth 2 (two) times daily.  180 tablet  3  . glipiZIDE (GLUCOTROL) 10 MG tablet Take 1 tablet (10 mg total) by mouth 2 (two) times daily before a meal.  60 tablet  3  . lisinopril (PRINIVIL,ZESTRIL) 5 MG tablet TAKE ONE TABLET BY MOUTH EVERY DAY  30 tablet  11  . metFORMIN (GLUCOPHAGE) 500 MG tablet Take 1,000 mg by mouth 2 (two) times daily with a meal.      . nicotine  (NICODERM CQ - DOSED IN MG/24 HOURS) 14 mg/24hr patch Place 1 patch onto the skin daily.      . ondansetron (ZOFRAN) 8 MG tablet Take 8 mg by mouth every 8 (eight) hours as needed for nausea.      Marland Kitchen oxymetazoline (AFRIN) 0.05 % nasal spray Place 2 sprays into the nose 2 (two) times daily as needed. For nasal congestion      . potassium chloride (K-DUR) 10 MEQ tablet Take 1 tablet (10 mEq total) by mouth 2 (two) times daily.  60 tablet  5  . torsemide (DEMADEX) 20 MG tablet Take 1 tablet (20 mg total) by mouth 2 (two) times daily.  60 tablet  5   No current facility-administered medications for this visit.     Allergies: No Known Allergies  Past Medical History, Surgical history, Social history, and Family History were reviewed and updated.  Review of Systems: Constitutional:  Negative for fever, chills, night sweats, anorexia, weight loss, pain. Cardiovascular: no chest pain or dyspnea on exertion Respiratory: negative Neurological: negative Dermatological: negative ENT: negative Skin: Negative. Gastrointestinal: negative Genito-Urinary: negative Hematological and Lymphatic: negative Breast: negative Musculoskeletal: negative Remaining ROS negative.  Physical Exam: Blood pressure 125/64, pulse 95, temperature 97.1 F (36.2 C), temperature source Oral, resp. rate 18, height 6' 0.5" (1.842 m), weight 285 lb 11.2 oz (129.593 kg). ECOG: 1 General appearance: alert Head: Normocephalic,  without obvious abnormality, atraumatic Neck: no adenopathy, no carotid bruit, no JVD, supple, symmetrical, trachea midline and thyroid not enlarged, symmetric, no tenderness/mass/nodules Lymph nodes: Cervical, supraclavicular, and axillary nodes normal. Heart:regular rate and rhythm, S1, S2 normal, no murmur, click, rub or gallop Lung:chest clear, no wheezing, rales, normal symmetric air entry Abdomen: soft, non-tender, without masses or organomegaly EXT:no erythema, induration, or nodules No lymph  nodes noted on exam today.   Lab Results: Lab Results  Component Value Date   WBC 6.5 01/29/2013   HGB 11.3* 01/29/2013   HCT 32.2* 01/29/2013   MCV 89.0 01/29/2013   PLT 272 01/29/2013     Chemistry      Component Value Date/Time   NA 137 01/29/2013 0838   NA 137 01/23/2013 1008   K 4.2 01/29/2013 0838   K 3.8 01/23/2013 1008   CL 100 01/23/2013 1008   CL 106 12/19/2012 0832   CO2 24 01/29/2013 0838   CO2 30 01/23/2013 1008   BUN 16.4 01/29/2013 0838   BUN 19 01/23/2013 1008   CREATININE 1.2 01/29/2013 0838   CREATININE 1.2 01/23/2013 1008      Component Value Date/Time   CALCIUM 9.5 01/29/2013 0838   CALCIUM 9.5 01/23/2013 1008   ALKPHOS 80 01/29/2013 0838   ALKPHOS 84 07/11/2012 0435   AST 16 01/29/2013 0838   AST 22 07/11/2012 0435   ALT 13 01/29/2013 0838   ALT 20 07/11/2012 0435   BILITOT 0.61 01/29/2013 0838   BILITOT 0.6 07/11/2012 0435      CT CHEST, ABDOMEN AND PELVIS WITH CONTRAST  Technique: Multidetector CT imaging of the chest, abdomen and  pelvis was performed following the standard protocol during bolus  administration of intravenous contrast.  Contrast: OMNIPAQUE IOHEXOL 300 MG/ML SOLN  Comparison: 10/30/2012  CT CHEST  Findings: The chest wall is unremarkable and stable. Stable  supraclavicular adenopathy. No axillary adenopathy. A right-sided  Port-A-Cath is noted. The bony thorax is intact. No destructive  bone lesions or spinal canal compromise. Advanced degenerative  changes involving both shoulders and the thoracic spine.  The heart is normal in size. No pericardial effusion. Persistent  mediastinal lymphadenopathy. No change and measurements of  prevascular and pretracheal lymph nodes. The subcarinal nodal mass  is also stable. The aorta is normal in caliber. No dissection.  The esophagus is grossly normal.  Examination of the lung parenchyma demonstrates a persistent mosaic  pattern in the lungs. This pattern worsens with expiration.  Possibilities include  reactive airways disease/asthma or  respiratory bronchiolitis. No focal infiltrates or pulmonary edema.  Patchy areas of atelectasis are noted. No worrisome pulmonary  nodules and no findings to suggest intrapulmonary lymphoma.  IMPRESSION:  1. Stable supraclavicular and mediastinal adenopathy.  2. No findings for lymphomatous involvement of the lungs. There  is a mosaic pattern in the lungs which is most likely due to  reactive airways disease/asthma or respiratory bronchiolitis.  3. No worrisome pulmonary nodules or masses.  CT ABDOMEN AND PELVIS  Findings: Examination limited by patient motion. The liver is  grossly normal. The gallbladder is grossly normal and no common  bile duct dilatation is appreciated. The pancreas is grossly  normal. No obvious splenic lesions. The adrenal glands and  kidneys demonstrate no gross abnormalities.  The stomach, duodenum, small bowel and colon grossly normal. No  inflammatory changes or mass lesions.  The aorta is normal in caliber. The major branch vessels are  patent. No significant interval change in small  mesenteric and  retroperitoneal lymph nodes.  The bladder, prostate gland and seminal vesicles are unremarkable.  Small scattered lymph nodes but no adenopathy. No inguinal mass or  adenopathy.  The bony structures are unremarkable.  IMPRESSION:  1. Examination limited by motion artifact.  2. No change in small scattered abdominal and pelvic lymph nodes.    Impression and Plan:  This is a 67 year old gentleman with the following issues: 1. Chronic lymphocytic leukemia/small lymphocytic lymphoma. He is S/P B-R chemotherapy. He is toleratedt well without complications. CT on 01/29/2013 reviewed with the patient today and showed a complete response to therapy.  No further chemotherapy will be needed. We will continue observation and follow up and repeat CT scan ion 6 months.   2. Nausea prophylaxis. on Zofran without major issues.   3.  Shortness of breath: He is following with Pulmonary medicine for that.   4. Shoulder pain: Due to osteoarthritis. Improved now.   5. Follow up: In 6 weeks for port flush and in 3 months for a clinical visit.       SHADAD,FIRAS 7/17/20148:58 AM

## 2013-01-31 NOTE — Patient Instructions (Addendum)
Trial of anoro one inhalation each am everyday.  Rinse mouth well. Keep working on weight loss Would talk with your cardiologist about referral to cardiac rehab program at the hospital. Please call me in 4 weeks with how things are going on the new inhaler.

## 2013-01-31 NOTE — Assessment & Plan Note (Signed)
The patient has seen a significant improvement in his breathing after changes to his cardiac medications.  He continues to have some dyspnea on exertion that is related to his obesity and deconditioning, but with his CT chest showing mosaic perfusion changes, he may have subtle airflow obstruction.  I am willing to give him a trial of a bronchodilator regimen to see if he sees a difference, but if he does not, I would discontinue the medication.  He will call me in 4 weeks with his response to therapy.

## 2013-02-04 ENCOUNTER — Ambulatory Visit: Payer: Medicare Other | Admitting: Pulmonary Disease

## 2013-02-07 ENCOUNTER — Telehealth: Payer: Self-pay | Admitting: *Deleted

## 2013-02-07 ENCOUNTER — Telehealth: Payer: Self-pay | Admitting: Cardiovascular Disease

## 2013-02-07 NOTE — Telephone Encounter (Signed)
Pt was told to pick up new med/ strength at pharmacy. Pt had to dbl up on old strength and now needs to get new and go back to 1 pill bid/ explained to pt and he verbalized understanding.

## 2013-02-07 NOTE — Telephone Encounter (Signed)
New problem  Pt has finished taking coregcarvedilol & wants to know if dr Elease Hashimoto wants him to continue taking

## 2013-02-07 NOTE — Telephone Encounter (Signed)
Discussed with pt

## 2013-02-07 NOTE — Telephone Encounter (Signed)
Has he changed his diet or increased his carbs. I think i am supposed to see him back in 1 month to recheck his A1C. Monitor sugars until then. If sustained  > 250, come see me earlier and we will recheck lab work and likely add another oral medication versus starting insulin.

## 2013-02-07 NOTE — Telephone Encounter (Signed)
lmovm for pt to return call.  

## 2013-02-07 NOTE — Telephone Encounter (Signed)
Pt left msg stating that his CBGs have been averaging 250.

## 2013-02-20 ENCOUNTER — Other Ambulatory Visit: Payer: Self-pay

## 2013-02-22 NOTE — Progress Notes (Signed)
Quick Note:  LMTCB x1 ______ 

## 2013-02-25 ENCOUNTER — Ambulatory Visit (INDEPENDENT_AMBULATORY_CARE_PROVIDER_SITE_OTHER): Payer: Medicare Other | Admitting: Cardiovascular Disease

## 2013-02-25 ENCOUNTER — Encounter: Payer: Self-pay | Admitting: Cardiovascular Disease

## 2013-02-25 VITALS — BP 114/78 | HR 102 | Ht 77.0 in | Wt 283.8 lb

## 2013-02-25 DIAGNOSIS — E785 Hyperlipidemia, unspecified: Secondary | ICD-10-CM

## 2013-02-25 DIAGNOSIS — I509 Heart failure, unspecified: Secondary | ICD-10-CM

## 2013-02-25 DIAGNOSIS — I2510 Atherosclerotic heart disease of native coronary artery without angina pectoris: Secondary | ICD-10-CM

## 2013-02-25 DIAGNOSIS — R0602 Shortness of breath: Secondary | ICD-10-CM

## 2013-02-25 DIAGNOSIS — I10 Essential (primary) hypertension: Secondary | ICD-10-CM

## 2013-02-25 DIAGNOSIS — I5042 Chronic combined systolic (congestive) and diastolic (congestive) heart failure: Secondary | ICD-10-CM

## 2013-02-25 LAB — BASIC METABOLIC PANEL
CO2: 29 mEq/L (ref 19–32)
Chloride: 97 mEq/L (ref 96–112)
Creatinine, Ser: 1.4 mg/dL (ref 0.4–1.5)
Glucose, Bld: 286 mg/dL — ABNORMAL HIGH (ref 70–99)
Sodium: 133 mEq/L — ABNORMAL LOW (ref 135–145)

## 2013-02-25 LAB — LIPID PANEL
HDL: 44.5 mg/dL (ref >39.00)
LDL Cholesterol: 73 mg/dL (ref 0–99)
Total CHOL/HDL Ratio: 3
Triglycerides: 179 mg/dL — ABNORMAL HIGH (ref 0.0–149.0)

## 2013-02-25 LAB — HEPATIC FUNCTION PANEL
Albumin: 4.3 g/dL (ref 3.5–5.2)
Total Bilirubin: 0.4 mg/dL (ref 0.3–1.2)

## 2013-02-25 MED ORDER — CARVEDILOL 12.5 MG PO TABS: 12.5000 mg | ORAL_TABLET | Freq: Two times a day (BID) | ORAL | Status: DC

## 2013-02-25 NOTE — Assessment & Plan Note (Signed)
BP is better.  Will continue to titrate up his coreg.

## 2013-02-25 NOTE — Progress Notes (Signed)
Ames Coupe Date of Birth  12/02/1945       [facility] Office [address]  [address] [place], [place]  [address]   [place], [place]  [address] [phone]     [phone]   Fax  [phone]    Fax [phone]  Problem List: 1. Coronary artery disease 2. Ischemic cardiopathy with an ejection fraction of around 35% 3. Aortic stenosis 4.  Non hodgekins lymphoma 5. Type 2 diabetes mellitus 6.  Sleep apnea  History of Present Illness:  Mr. Gendron is a 67 yo with hx of CAD, CHF.  He has been seen at [facility] for the past year.  He has had progressive dyspnea for the past 4 months.   He has had increased swelling in his legs and ankles.  He has not been able to get back in with [facility] and wanted to come here for further evaluation.    He had 2 stents placed in Oct. 2013.  He did well for 6 months.  Then his condition started to decline - porgressive , severe DOE with any exertion.  [place]cho recently showed. Left ventricle: The cavity size was mildly dilated. Wall thickness was normal. Systolic function was mildly to  moderately reduced. The estimated ejection fraction was in the range of 40% to 45%. There is akinesis of the posterior and distal lateral myocardium; inferior hypokinesis. Doppler parameters are consistent with abnormal left ventricular relaxation (grade 1 diastolic dysfunction). - Mitral valve: Mild regurgitation. - Left atrium: The atrium was mildly dilated. PA pressures of 62.  January 23, 2013: Mr. Sievers is doing much better.  We started him on torsemide and he has diuresed quite nicely.  He has lost 17 pounds in the past 2 weeks.  He is still dyspneic with exertion.  He is trying to avoid salt but is having some challenges. No CP.   No PND.  No significant edema.  February 25, 2013: Mr. Jantz is dong well.  Breathing is better.  He is having some foot / toe pain.  Needs to keep his toenails trimmed  better.     Current Outpatient Prescriptions on File Prior to Visit  Medication Sig Dispense Refill  . Alum & Mag Hydroxide-Simeth (GI COCKTAIL) SUSP suspension Take 30 mLs by mouth 3 (three) times daily as needed for indigestion. Shake well.  300 mL  0  . aspirin EC 81 MG EC tablet Take 1 tablet (81 mg total) by mouth daily.      Marland Kitchen atorvastatin (LIPITOR) 20 MG tablet Take 1 tablet (20 mg total) by mouth daily at 6 PM.  30 tablet  5  . carvedilol (COREG) 6.25 MG tablet Take 1 tablet (6.25 mg total) by mouth 2 (two) times daily.  180 tablet  3  . glipiZIDE (GLUCOTROL) 10 MG tablet Take 1 tablet (10 mg total) by mouth 2 (two) times daily before a meal.  60 tablet  3  . lisinopril (PRINIVIL,ZESTRIL) 5 MG tablet TAKE ONE TABLET BY MOUTH EVERY DAY  30 tablet  11  . metFORMIN (GLUCOPHAGE) 500 MG tablet Take 1,000 mg by mouth 2 (two) times daily with a meal.      . nicotine (NICODERM CQ - DOSED IN MG/24 HOURS) 14 mg/24hr patch Place 1 patch onto the skin daily.      . ondansetron (ZOFRAN) 8 MG tablet Take 8 mg by mouth every 8 (eight) hours as needed for nausea.      Marland Kitchen  oxymetazoline (AFRIN) 0.05 % nasal spray Place 2 sprays into the nose 2 (two) times daily as needed. For nasal congestion      . potassium chloride (K-DUR) 10 MEQ tablet Take 1 tablet (10 mEq total) by mouth 2 (two) times daily.  60 tablet  5  . torsemide (DEMADEX) 20 MG tablet Take 1 tablet (20 mg total) by mouth 2 (two) times daily.  60 tablet  5   No current facility-administered medications on file prior to visit.    No Known Allergies  Past Medical History  Diagnosis Date  . Ischemic cardiomyopathy   . Type II diabetes mellitus   . Hypertension   . Hypercholesterolemia   . Heart murmur   . Shortness of breath     "all the time" (06/06/2012)  . Lower GI bleeding 1980's    "when I was drinking alot" (06/06/2012)  . Arthritis     "both shoulders" (06/06/2012)  . Chronic combined systolic and diastolic CHF (congestive  heart failure) 07/10/2012  . GERD (gastroesophageal reflux disease) 07/12/2012  . OSA on CPAP     uses a cpap  . Myocardial infarction 11/13  . Coronary artery disease   . Lymphoma   . History of alcoholism   . Sleep apnea   . Aortic stenosis     Past Surgical History  Procedure Laterality Date  . Coronary angioplasty with stent placement  06/06/2012    "1"  . Lymph gland excision  07/13/2012    Procedure: CERVICAL LYMPH GLAND EXCISION;  Surgeon: Currie Paris, MD;  Location: [facility] OR;  Service: General;  Laterality: Right;  . Portacath placement  07/30/2012    Procedure: INSERTION PORT-A-CATH;  Surgeon: Currie Paris, MD;  Location: [facility];  Service: General;  Laterality: Right;    History  Smoking status  . Former Smoker -- 1.00 packs/day for 53 years  . Types: Cigarettes  . Quit date: 07/19/2011  Smokeless tobacco  . Never Used    Comment: pt states he does not smoke as long as he uses patch    History  Alcohol Use No    Comment: 06/06/2012 "haven't had a drink in 22 years; I'm an alcoholic"    Family History  Problem Relation Age of Onset  . Osteoarthritis Mother   . Osteoarthritis Father   . Colon cancer Daughter   . Diabetes Father     entire family  . Heart disease Father     Reviw of Systems:  Reviewed in the HPI.  All other systems are negative.  Physical Exam: Blood pressure 114/78, pulse 102, height 6\' 5"  (1.956 m), weight 283 lb 12.8 oz (128.731 kg), SpO2 95.00%. General: Well developed, well nourished, in no acute distress.  Head: Normocephalic, atraumatic, sclera non-icteric, mucus membranes are moist,   Neck: Supple. Carotids are 2 + without bruits. JVD 10-14 cm  Lungs: Clear   Heart: RR, 2-3/6 systolic murmur, no S3 heard today  Abdomen: Soft, non-tender, slightly edematous  Msk:  Strength and tone are normal   Extremities: No clubbing or cyanosis. No  edema,     Neuro: CN II - XII intact.  Alert and  oriented X 3.   Psych:  Normal   ECG:   Assessment / Plan:

## 2013-02-25 NOTE — Patient Instructions (Addendum)
Your physician recommends that you return for lab work in: today bmet lipid liver  Your physician has recommended you make the following change in your medication:  Increase coreg/ carvedilol to 12.5 mg twice daily 12 hours apart.  Your physician recommends that you schedule a follow-up appointment in: 2-3 months     [facility] Low Glycemic Diet (Source: [facility], 2006) Low Glycemic Foods (20-49) (Decrease risk of developing heart disease) Breakfast Cereals: All-Bran All-Bran Fruit 'n Oats Fiber One Oatmeal (not instant) Oat bran Fruits and fruit juices: (Limit to 1-2 servings per day) Apples Apricots (fresh & dried) Blackberries Blueberries Cherries Cranberries Peaches Pears Plums Prunes Grapefruit Raspberries Strawberries Tangerine Apple juice Grapefruit juice Tomato juice Beans and legumes (fresh-cooked): Black-eyed peas Butter beans Chick peas Lentils  Green beans Lima beans Kidney beans Navy beans Pinto beans Snow peas Non-starchy vegetables: Asparagus, avocado, broccoli, cabbage, cauliflower, celery, cucumber, greens, lettuce, mushrooms, peppers, tomatoes, okra, onions, spinach, summer squash Grains: Barley Bulgur Rye Wild rice Nuts and oils : Almonds Peanuts Sunflower seeds Hazelnuts Pecans Walnuts Oils that are liquid at room temperature Dairy, fish, meat, soy, and eggs: Milk, skim Lowfat cheese Yogurt, lowfat, fruit sugar sweetened Lean red meat Fish  Skinless chicken & [place] Shellfish Egg whites (up to 3 daily) Soy products  Egg yolks (up to 7 or _____ per week) Moderate Glycemic Foods (50-69) Breakfast Cereals: Bran Buds Bran Chex Just Right Mini-Wheats  Special K Swiss muesli Fruits: Banana (under-ripe) Dates Figs Grapes Kiwi Mango Oranges Raisins Fruit Juices: Cranberry juice Orange juice Beans and legumes: Boston-type baked beans Canned pinto, kidney, or navy beans Green peas Vegetables: Beets Carrots  Sweet  potato Yam Corn on the cob Breads: Pita (pocket) bread Oat bran bread Pumpernickel bread Rye bread Wheat bread, high fiber  Grains: Cornmeal Rice, brown Rice, white Couscous Pasta: Macaroni Pizza, cheese Ravioli, meat filled Spaghetti, white  Nuts: Cashews Macadamia Snacks: Chocolate Ice cream, lowfat Muffin Popcorn High Glycemic Foods (70-100)  Breakfast Cereals: Cheerios Corn Chex Corn Flakes Cream of Wheat Grape Nuts Grape Nut Flakes Grits Nutri-Grain Puffed Rice Puffed Wheat Rice Chex Rice Krispies Shredded Wheat Team Total Fruits: Pineapple Watermelon Banana (over-ripe) Beverages: Sodas, sweet tea, pineapple juice Vegetables: Potato, baked, boiled, fried, mashed Jamaica fries Canned or frozen corn Parsnips Winter squash Breads: Most breads (white and whole grain) Bagels Bread sticks Bread stuffing Kaiser roll Dinner rolls Grains: Rice, instant Tapioca, with milk Candy and most cookies Snacks: Donuts Corn chips Jelly beans Pretzels Pastries

## 2013-02-25 NOTE — Assessment & Plan Note (Signed)
[name] [name] continues to improve.  He is breathing so much better.  Will increase his coreg to 12. 5 BID.  We have reviewed a low salt and low carb diet again.    Will anticipate referring him to cardiac rehab after his next OV in 2-3 months.

## 2013-02-25 NOTE — Assessment & Plan Note (Signed)
He has a hx of CAD but has not any angina .  He is breathing much better.  I doubt that his symptoms are due to worsening CAD.

## 2013-02-27 ENCOUNTER — Telehealth: Payer: Self-pay | Admitting: *Deleted

## 2013-02-27 DIAGNOSIS — C8590 Non-Hodgkin lymphoma, unspecified, unspecified site: Secondary | ICD-10-CM

## 2013-02-27 DIAGNOSIS — I10 Essential (primary) hypertension: Secondary | ICD-10-CM

## 2013-02-27 DIAGNOSIS — E875 Hyperkalemia: Secondary | ICD-10-CM

## 2013-02-27 DIAGNOSIS — E119 Type 2 diabetes mellitus without complications: Secondary | ICD-10-CM

## 2013-02-27 MED ORDER — POTASSIUM CHLORIDE ER 10 MEQ PO TBCR: 10.0000 meq | EXTENDED_RELEASE_TABLET | Freq: Every day | ORAL | Status: DC

## 2013-02-27 NOTE — Telephone Encounter (Signed)
Pt was informed and lab date given

## 2013-02-27 NOTE — Telephone Encounter (Signed)
Message copied by Antony Odea on Wed Feb 27, 2013  1:12 PM ------      Message from: Vesta Mixer      Created: Mon Feb 25, 2013  5:57 PM       Reduce kdur to 10 QD.  Recheck in 3-4 weeks       ------

## 2013-02-28 ENCOUNTER — Telehealth: Payer: Self-pay | Admitting: Pulmonary Disease

## 2013-02-28 MED ORDER — UMECLIDINIUM-VILANTEROL 62.5-25 MCG/INH IN AEPB: 1.0000 | INHALATION_SPRAY | Freq: Every day | RESPIRATORY_TRACT | Status: DC

## 2013-02-28 NOTE — Telephone Encounter (Signed)
I spoke with pt and he stated the anoro has been helping with his breathing and he seems to rest better as well d/t breathing is better. He has 1 dose left for tomorrow and then will be out. Please advise KC thanks

## 2013-02-28 NOTE — Telephone Encounter (Signed)
Rx has been sent in. 

## 2013-02-28 NOTE — Telephone Encounter (Signed)
Pt also needs f/u with me in 6mos.

## 2013-02-28 NOTE — Telephone Encounter (Signed)
Ok to have script with refills.

## 2013-03-13 ENCOUNTER — Telehealth: Payer: Self-pay | Admitting: *Deleted

## 2013-03-13 NOTE — Telephone Encounter (Signed)
Pt insurance does not cover Anoro with [facility]. Preferred alternatives that are covered by insurance to try first: Spiriva  Please advise Dr Shelle Iron. Thanks.

## 2013-03-13 NOTE — Telephone Encounter (Signed)
Ok to change to spiriva one inhalation each am.  Let pt know he needs to give it about 3-4 weeks before deciding whether it helps or not.

## 2013-03-13 NOTE — Telephone Encounter (Signed)
LMOM x 1 detailed

## 2013-03-14 ENCOUNTER — Ambulatory Visit (HOSPITAL_BASED_OUTPATIENT_CLINIC_OR_DEPARTMENT_OTHER): Payer: Medicare Other

## 2013-03-14 VITALS — BP 139/65 | HR 88 | Temp 97.7°F | Resp 18

## 2013-03-14 DIAGNOSIS — C8589 Other specified types of non-Hodgkin lymphoma, extranodal and solid organ sites: Secondary | ICD-10-CM

## 2013-03-14 DIAGNOSIS — C8590 Non-Hodgkin lymphoma, unspecified, unspecified site: Secondary | ICD-10-CM

## 2013-03-14 MED ORDER — SODIUM CHLORIDE 0.9 % IJ SOLN
10.0000 mL | INTRAMUSCULAR | Status: DC | PRN
  Administered 2013-03-14: 10 mL via INTRAVENOUS
  Filled 2013-03-14: qty 10

## 2013-03-14 MED ORDER — HEPARIN SOD (PORK) LOCK FLUSH 100 UNIT/ML IV SOLN
500.0000 [IU] | Freq: Once | INTRAVENOUS | Status: AC
  Administered 2013-03-14: 500 [IU] via INTRAVENOUS
  Filled 2013-03-14: qty 5

## 2013-03-14 MED ORDER — TIOTROPIUM BROMIDE MONOHYDRATE 18 MCG IN CAPS: 18.0000 ug | ORAL_CAPSULE | Freq: Every day | RESPIRATORY_TRACT | Status: DC

## 2013-03-14 NOTE — Patient Instructions (Addendum)

## 2013-03-14 NOTE — Telephone Encounter (Signed)
Pt in lobby. He decided to come in to talk to nurse instead of calling back.

## 2013-03-14 NOTE — Telephone Encounter (Signed)
Pt is aware anoro not covered and we are calling in spiriva. Nothing further needed

## 2013-03-15 ENCOUNTER — Encounter: Payer: Self-pay | Admitting: Internal Medicine

## 2013-03-15 ENCOUNTER — Ambulatory Visit (INDEPENDENT_AMBULATORY_CARE_PROVIDER_SITE_OTHER): Payer: Medicare Other | Admitting: Internal Medicine

## 2013-03-15 VITALS — BP 104/70 | HR 96 | Temp 97.7°F | Wt 286.0 lb

## 2013-03-15 DIAGNOSIS — M19019 Primary osteoarthritis, unspecified shoulder: Secondary | ICD-10-CM

## 2013-03-15 DIAGNOSIS — M25519 Pain in unspecified shoulder: Secondary | ICD-10-CM

## 2013-03-15 NOTE — Patient Instructions (Signed)

## 2013-03-15 NOTE — Assessment & Plan Note (Signed)
Bilateral L>R Xrays from 11/30/2012 reviewed Aleve as needed for pain/inflammation Ok to exercise for now, but if symptoms get worse, then stop Will refer you to Dr. Katrinka Blazing sports medicine or orthopedics

## 2013-03-15 NOTE — Progress Notes (Signed)
Subjective:    Patient ID: [name] [name], male    DOB: [birth year], 67 y.o.   MRN: [id number]  HPI  Pt presents to the clinic today with c/o shoulder pain. This is a chronic issue for him. He did have xrays performed on bilateral shoulders 11/30/2012 which revealed degenerative changes at the [facility] joint with bony overgrowth, left seems to be worse than the right. He was offered a referral to orthopedics at that time but declined. He would like to start exercising with the silver sneakers program but just wants to make sure his shoulders will not get worse with exercise. He is not currently taking anything for the pain.   Review of Systems      Past Medical History  Diagnosis Date  . Ischemic cardiomyopathy   . Type II diabetes mellitus   . Hypertension   . Hypercholesterolemia   . Heart murmur   . Shortness of breath     "all the time" (06/06/2012)  . Lower GI bleeding 1980's    "when I was drinking alot" (06/06/2012)  . Arthritis     "both shoulders" (06/06/2012)  . Chronic combined systolic and diastolic CHF (congestive heart failure) 07/10/2012  . GERD (gastroesophageal reflux disease) 07/12/2012  . OSA on CPAP     uses a cpap  . Myocardial infarction 11/13  . Coronary artery disease   . Lymphoma   . History of alcoholism   . Sleep apnea   . Aortic stenosis     Current Outpatient Prescriptions  Medication Sig Dispense Refill  . Alum & Mag Hydroxide-Simeth (GI COCKTAIL) SUSP suspension Take 30 mLs by mouth 3 (three) times daily as needed for indigestion. Shake well.  300 mL  0  . aspirin EC 81 MG EC tablet Take 1 tablet (81 mg total) by mouth daily.      Marland Kitchen atorvastatin (LIPITOR) 20 MG tablet Take 1 tablet (20 mg total) by mouth daily at 6 PM.  30 tablet  5  . carvedilol (COREG) 12.5 MG tablet Take 1 tablet (12.5 mg total) by mouth 2 (two) times daily.  180 tablet  3  . glipiZIDE (GLUCOTROL) 10 MG tablet Take 1 tablet (10 mg total) by mouth 2 (two) times daily before a meal.   60 tablet  3  . lisinopril (PRINIVIL,ZESTRIL) 5 MG tablet TAKE ONE TABLET BY MOUTH EVERY DAY  30 tablet  11  . metFORMIN (GLUCOPHAGE) 500 MG tablet Take 1,000 mg by mouth 2 (two) times daily with a meal.      . nicotine (NICODERM CQ - DOSED IN MG/24 HOURS) 14 mg/24hr patch Place 1 patch onto the skin daily.      . ondansetron (ZOFRAN) 8 MG tablet Take 8 mg by mouth every 8 (eight) hours as needed for nausea.      Marland Kitchen oxymetazoline (AFRIN) 0.05 % nasal spray Place 2 sprays into the nose 2 (two) times daily as needed. For nasal congestion      . potassium chloride (K-DUR) 10 MEQ tablet Take 1 tablet (10 mEq total) by mouth daily.  30 tablet  5  . tiotropium (SPIRIVA) 18 MCG inhalation capsule Place 1 capsule (18 mcg total) into inhaler and inhale daily.  30 capsule  6  . torsemide (DEMADEX) 20 MG tablet Take 1 tablet (20 mg total) by mouth 2 (two) times daily.  60 tablet  5  . Umeclidinium-Vilanterol (ANORO ELLIPTA) 62.5-25 MCG/INH AEPB Inhale 1 puff into the lungs daily.  30 each  5  No current facility-administered medications for this visit.    No Known Allergies  Family History  Problem Relation Age of Onset  . Osteoarthritis Mother   . Osteoarthritis Father   . Colon cancer Daughter   . Diabetes Father     entire family  . Heart disease Father     History   Social History  . Marital Status: Single    [name]    Number of Children: 2  . Years of Education: 12   Occupational History  . retired    Social History Main Topics  . Smoking status: Former Smoker -- 1.00 packs/day for 53 years    Types: Cigarettes    Quit date: 07/19/2011  . Smokeless tobacco: Never Used     Comment: pt states he does not smoke as long as he uses patch  . Alcohol Use: No     Comment: 06/06/2012 "haven't had a drink in 22 years; I'm an alcoholic"  . Drug Use: No  . Sexual Activity: No   Other Topics Concern  . 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   Lives by self, since '78   [name] in [place]     Regular exercise-no   Caffeine Use-no     Constitutional: Denies fever, malaise, fatigue, headache or abrupt weight changes. . Musculoskeletal: Pt reports shoulder pain. Denies decrease in range of motion, difficulty with gait, muscle pain or joint swelling.    No other specific complaints in a complete review of systems (except as listed in HPI above).  Objective:   Physical Exam   BP 104/70  Pulse 96  Temp(Src) 97.7 F (36.5 C) (Oral)  Wt 286 lb (129.729 kg)  BMI 33.91 kg/m2  SpO2 96% Wt Readings from Last 3 Encounters:  03/15/13 286 lb (129.729 kg)  02/25/13 283 lb 12.8 oz (128.731 kg)  01/31/13 286 lb 9.6 oz (130.001 kg)    General: Appears his stated age, chronically ill appearing in NAD. Cardiovascular: Normal rate and rhythm. S1,S2 noted. Murmur noted. No rubs or gallops noted. No JVD or BLE edema. No carotid bruits noted. Pulmonary/Chest: Normal effort and positive vesicular breath sounds. No respiratory distress. No wheezes, rales or ronchi noted.  Musculoskeletal: Limited range of motion of bilateral shoulders. Tender to palpation over the [facility] joints.  No signs of joint swelling. No difficulty with gait.    BMET    Component Value Date/Time   NA 133* 02/25/2013 1005   NA 137 01/29/2013 0838   K 5.2* 02/25/2013 1005   K 4.2 01/29/2013 0838   CL 97 02/25/2013 1005   CL 106 12/19/2012 0832   CO2 29 02/25/2013 1005   CO2 24 01/29/2013 0838   GLUCOSE 286* 02/25/2013 1005   GLUCOSE 188* 01/29/2013 0838   GLUCOSE 102* 12/19/2012 0832   BUN 26* 02/25/2013 1005   BUN 16.4 01/29/2013 0838   CREATININE 1.4 02/25/2013 1005   CREATININE 1.2 01/29/2013 0838   CALCIUM 9.7 02/25/2013 1005   CALCIUM 9.5 01/29/2013 0838   GFRNONAA 74* 07/14/2012 0432   GFRAA 86* 07/14/2012 0432    Lipid Panel     Component Value Date/Time   CHOL 153 02/25/2013 1005   TRIG 179.0* 02/25/2013 1005   HDL 44.50 02/25/2013 1005    CHOLHDL 3 02/25/2013 1005   VLDL 35.8 02/25/2013 1005   LDLCALC 73 02/25/2013 1005    CBC    Component Value Date/Time  WBC 6.5 01/29/2013 0837   WBC 5.2 11/30/2012 1520   RBC 3.62* 01/29/2013 0837   RBC 3.93* 11/30/2012 1520   HGB 11.3* 01/29/2013 0837   HGB 11.8* 11/30/2012 1520   HCT 32.2* 01/29/2013 0837   HCT 34.7* 11/30/2012 1520   PLT 272 01/29/2013 0837   PLT 222.0 11/30/2012 1520   MCV 89.0 01/29/2013 0837   MCV 88.4 11/30/2012 1520   MCH 31.4 01/29/2013 0837   MCH 28.9 07/14/2012 0432   MCHC 35.2 01/29/2013 0837   MCHC 33.9 11/30/2012 1520   RDW 14.9* 01/29/2013 0837   RDW 15.2* 11/30/2012 1520   LYMPHSABS 1.1 01/29/2013 0837   LYMPHSABS 7.7* 07/11/2012 0435   MONOABS 0.7 01/29/2013 0837   MONOABS 0.6 07/11/2012 0435   EOSABS 0.6* 01/29/2013 0837   EOSABS 0.6 07/11/2012 0435   BASOSABS 0.1 01/29/2013 0837   BASOSABS 0.0 07/11/2012 0435    Hgb A1C Lab Results  Component Value Date   HGBA1C 11.9* 11/30/2012         Assessment & Plan:

## 2013-03-15 NOTE — Addendum Note (Signed)
Addended by: Lorre Munroe on: 03/15/2013 11:28 AM   Modules accepted: Orders

## 2013-03-19 ENCOUNTER — Telehealth: Payer: Self-pay | Admitting: *Deleted

## 2013-03-20 ENCOUNTER — Ambulatory Visit (INDEPENDENT_AMBULATORY_CARE_PROVIDER_SITE_OTHER): Payer: Medicare Other | Admitting: *Deleted

## 2013-03-20 DIAGNOSIS — E875 Hyperkalemia: Secondary | ICD-10-CM

## 2013-03-20 LAB — BASIC METABOLIC PANEL
CO2: 26 mEq/L (ref 19–32)
Chloride: 101 mEq/L (ref 96–112)
Glucose, Bld: 217 mg/dL — ABNORMAL HIGH (ref 70–99)
Sodium: 135 mEq/L (ref 135–145)

## 2013-03-27 ENCOUNTER — Telehealth: Payer: Self-pay | Admitting: *Deleted

## 2013-03-27 NOTE — Telephone Encounter (Signed)
ANORO covered was DENIED through [employer].  Ref # [id number] Pt has already been changed to Spiriva which is covered. Nothing further needed.

## 2013-03-29 ENCOUNTER — Ambulatory Visit (INDEPENDENT_AMBULATORY_CARE_PROVIDER_SITE_OTHER): Payer: Medicare Other | Admitting: Family Medicine

## 2013-03-29 ENCOUNTER — Encounter: Payer: Self-pay | Admitting: Family Medicine

## 2013-03-29 VITALS — BP 118/72 | HR 96 | Wt 276.0 lb

## 2013-03-29 DIAGNOSIS — M19019 Primary osteoarthritis, unspecified shoulder: Secondary | ICD-10-CM | POA: Insufficient documentation

## 2013-03-29 NOTE — Assessment & Plan Note (Signed)
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 Patient did have 2 corticosteroid injections in his shoulders today. I do hoped that this will be beneficial and help his symptomatology. Home exercise as well as formal physical therapy prescribed. Discussed over-the-counter medications in great detail. Discussed diagnosis prognosis and potential need for interventional care in the long term such as shoulder replacement surgery. Patient will return again in 3 weeks.

## 2013-03-29 NOTE — Progress Notes (Signed)
I'm seeing this patient by the request  of:  Nicki Reaper  CC: Shoulder pain  HPI: Patient is a pleasant 67 year old male who is coming in with 4 month history of bilateral shoulder pain. Patient has had x-rays previously and did show that he had degenerative a.c. joint arthritis bilaterally left worse than right. These images were reviewed by me today. Patient was given exercises to do by primary care provider 2 weeks ago. Patient states since that time the patient states she's had worsening pain. Patient states he is losing more mobility as well. Patient states that the pain also keeps him up at night. Patient describes the pain as a constant dull aching sensation with sharp pain from time to time. Patient also states that he can caught in a certain position in his shoulder seem to get stuck there. Patient states that the pain is bilaterally and aggravated by any type of movements. Patient states he has not tried anything that has helped in the past. Patient presents the severity of 8/10. It is affecting his activities of daily living such as dressing.:  Past medical, surgical, family and social history reviewed. Medications reviewed all in the electronic medical record.   Review of Systems: No headache, visual changes, nausea, vomiting, diarrhea, constipation, dizziness, abdominal pain, skin rash, fevers, chills, night sweats, weight loss, swollen lymph nodes, body aches, joint swelling, muscle aches, chest pain, shortness of breath, mood changes.   Objective:    Blood pressure 118/72, pulse 96, weight 276 lb (125.193 kg).   General: No apparent distress alert and oriented x3 mood and affect normal, dressed appropriately. obese HEENT: Pupils equal, extraocular movements intact Respiratory: Patient's speak in full sentences and does not appear short of breath Cardiovascular: No lower extremity edema, non tender, no erythema Skin: Warm dry intact with no signs of infection or rash on extremities  or on axial skeleton. Abdomen: Soft nontender Neuro: Cranial nerves II through XII are intact, neurovascularly intact in all extremities with 2+ DTRs and 2+ pulses. Lymph: No lymphadenopathy of posterior or anterior cervical chain or axillae bilaterally.  Gait normal with good balance and coordination.  MSK: Non tender with full range of motion and good stability and symmetric strength and tone of  elbows, wrist, hip, knee and ankles bilaterally.  Shoulder: bilateral Inspection reveals no abnormalities, atrophy or asymmetry. Patient has generalized tenderness to palpation of the shoulder joint especially of the a.c. joints bilaterally Patient has severe limited but symmetric decreased range of motion bilaterally. Patient has external rotation to 15, internal rotation to hip, abduction to 100 bilaterally. Rotator cuff strength is weakened bilaterally to 3/5 but symmetric. I do think it is intact the Patient does have positive crepitus with range of motion. No need to do an impingement test with pain with all range of motion. Speeds and Yergason's tests normal. Normal scapular function observed. No painful arc and no drop arm sign. No apprehension sign  X-rays were reviewed by me today. X-ray show that patient does have moderate to severe osteoarthritic changes of the shoulder joints bilaterally as well as the a.c. joint. This does not appear to have a high riding humeral head which likely means rotator cuff is still intact.  MSK US performed of: Bilateral shoulders limited exam This study was ordered, performed, and interpreted by Terrilee Files D.O.  Shoulder:  Bilateral Patient's rotator cuff including supraspinatus, subscapularis, infraspinatus, and teres minor was visualized bilaterally and does have atrophy bilaterally. It appears that the rotator cuff is  intact but difficult to assess secondary to the degree of osteoarthritic changes. AC joint:  Severely distended bilaterally with positive  geyser sign Glenohumeral Joint:  Severe osteoarthritis changes Glenoid Labrum:  Unable to visualize Biceps Tendon:  Positive hypoechoic changes but no tear appreciated no fraying of tendon, tendon located in intertubercular groove, no subluxation with shoulder internal or external rotation.  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  Verbal informed consent obtained.  Time-out conducted.  Noted no overlying erythema, induration, or other signs of local infection.  Skin prepped in a sterile fashion.  Local anesthesia: Topical Ethyl chloride.  With sterile technique and under real time ultrasound guidance:  Joint visualized.  22g 1  inch needle inserted posterior approach. Pictures taken for needle placement. Patient did have injection of 2 cc of 1% lidocaine, 2 cc of 0.5% Marcaine, and 1.0 cc of Kenalog 40 mg/dL. Completed without difficulty  Pain immediately resolved suggesting accurate placement of the medication.  Advised to call if fevers/chills, erythema, induration, drainage, or persistent bleeding.  Images permanently stored and available for review in the ultrasound unit.  Impression: Technically successful ultrasound guided injection.  Procedure: Real-time Ultrasound Guided Injection of left glenohumeral joint Device: GE Logiq E  Ultrasound guided injection is preferred based studies that show increased duration, increased effect, greater accuracy, decreased procedural pain, increased response rate with ultrasound guided versus blind injection.  Verbal informed consent obtained.  Time-out conducted.  Noted no overlying  erythema, induration, or other signs of local infection.  Skin prepped in a sterile fashion.  Local anesthesia: Topical Ethyl chloride.  With sterile technique and under real time ultrasound guidance:  Joint visualized.  22g 1  inch needle inserted posterior approach. Pictures taken for needle placement. Patient did have injection of 2 cc of 1% lidocaine, 2 cc of 0.5% Marcaine, and 1cc of Kenalog 40 mg/dL. Completed without difficulty  Pain immediately resolved suggesting accurate placement of the medication.  Advised to call if fevers/chills, erythema, induration, drainage, or persistent bleeding.  Images permanently stored and available for review in the ultrasound unit.  Impression: Technically successful ultrasound guided injection.     Impression and Recommendations:     This case required medical decision making of moderate complexity.

## 2013-03-29 NOTE — Patient Instructions (Signed)
Good to meet you Try these exercises daily.  You have advance arthritis in both shoulders.  The injections should help.  Take tylenol 325 mg three times a day is the best evidence based medicine we have for arthritis.  Glucosamine sulfate 750mg  twice a day is a supplement that has been shown to help moderate to severe arthritis. Capsaicin topically up to four times a day may also help with pain. It's important that you continue to stay active. Controlling your weight is important.  Will start physical therapy.  Heat or ice 20 minutes at a time 3-4 times a day as needed to help with pain. Water aerobics and cycling with low resistance are the best two types of exercise for arthritis. Come back and see me in 3 weeks.

## 2013-03-29 NOTE — Assessment & Plan Note (Signed)
Patient also has severe a.c. joint arthritis. I would like to see patient back again in 3 weeks and if he continues to have pain we will do a.c. joint injections to see if this will improve. Encourage him to continue home exercises and given him new ones as well as we'll start formal physical therapy. Discussed topical over-the-counter medications that can be beneficial as well as icing and heat they can be another modality. We'll see patient again in 3 weeks.  Spent greater than 50 minutes with patient face-to-face and had greater than 50% of counseling including as described above in assessment and plan.

## 2013-04-08 ENCOUNTER — Other Ambulatory Visit: Payer: Self-pay

## 2013-04-08 DIAGNOSIS — E119 Type 2 diabetes mellitus without complications: Secondary | ICD-10-CM

## 2013-04-08 MED ORDER — GLIPIZIDE 10 MG PO TABS: 10.0000 mg | ORAL_TABLET | Freq: Two times a day (BID) | ORAL | Status: DC

## 2013-04-08 NOTE — Telephone Encounter (Signed)
Glipizide was refilled to [facility]...ds,cma

## 2013-04-09 NOTE — Telephone Encounter (Signed)
error 

## 2013-04-15 ENCOUNTER — Ambulatory Visit: Payer: Medicare Other | Attending: Family Medicine

## 2013-04-15 DIAGNOSIS — IMO0001 Reserved for inherently not codable concepts without codable children: Secondary | ICD-10-CM | POA: Insufficient documentation

## 2013-04-15 DIAGNOSIS — M25619 Stiffness of unspecified shoulder, not elsewhere classified: Secondary | ICD-10-CM | POA: Insufficient documentation

## 2013-04-15 DIAGNOSIS — M25519 Pain in unspecified shoulder: Secondary | ICD-10-CM | POA: Insufficient documentation

## 2013-04-17 ENCOUNTER — Ambulatory Visit: Payer: Medicare Other | Attending: Family Medicine | Admitting: Rehabilitation

## 2013-04-17 DIAGNOSIS — IMO0001 Reserved for inherently not codable concepts without codable children: Secondary | ICD-10-CM | POA: Insufficient documentation

## 2013-04-17 DIAGNOSIS — M25619 Stiffness of unspecified shoulder, not elsewhere classified: Secondary | ICD-10-CM | POA: Insufficient documentation

## 2013-04-17 DIAGNOSIS — M25519 Pain in unspecified shoulder: Secondary | ICD-10-CM | POA: Insufficient documentation

## 2013-04-19 ENCOUNTER — Encounter: Payer: Self-pay | Admitting: Family Medicine

## 2013-04-19 ENCOUNTER — Other Ambulatory Visit: Payer: Self-pay | Admitting: Internal Medicine

## 2013-04-19 ENCOUNTER — Ambulatory Visit (INDEPENDENT_AMBULATORY_CARE_PROVIDER_SITE_OTHER): Payer: Medicare Other | Admitting: Family Medicine

## 2013-04-19 VITALS — BP 114/70 | HR 85 | Wt 275.0 lb

## 2013-04-19 DIAGNOSIS — M19019 Primary osteoarthritis, unspecified shoulder: Secondary | ICD-10-CM

## 2013-04-19 DIAGNOSIS — Z23 Encounter for immunization: Secondary | ICD-10-CM

## 2013-04-19 NOTE — Progress Notes (Signed)
  Subjective:    CC: Followup of bilateral shoulder pain  HPI: Patient is a 67 year old male with severe Oster arthritic changes of the shoulders bilaterally. At last visit patient was given to intra-articular steroid injections under ultrasound guidance as well as sent to formal physical therapy. Patient states that the first week he did not notice any improvement and almost came back. Patient to after that states that primus the pain has completely resolved. Patient is not taking any medications for this and continues to go to formal physical therapy but would like to stop secondary to financial reasons. Patient feels that it was beneficial to. Patient denies any new symptoms such as radiation down the arm. Patient still complains of weakness bilaterally left greater than right but he knows that this may not make a difference.  Past medical history, Surgical history, Family history not pertinant except as noted below, Social history, Allergies, and medications have been entered into the medical record, reviewed, and no changes needed.   Review of Systems: No fevers, chills, night sweats, weight loss, chest pain, or shortness of breath.   Objective:   Blood pressure 114/70, pulse 85, weight 275 lb (124.739 kg), SpO2 99.00%.  General: No apparent distress alert and oriented x3 mood and affect normal, dressed appropriately. obese  HEENT: Pupils equal, extraocular movements intact  Respiratory: Patient's speak in full sentences and does not appear short of breath  Cardiovascular: No lower extremity edema, non tender, no erythema  Skin: Warm dry intact with no signs of infection or rash on extremities or on axial skeleton.  Abdomen: Soft nontender  Neuro: Cranial nerves II through XII are intact, neurovascularly intact in all extremities with 2+ DTRs and 2+ pulses.  Lymph: No lymphadenopathy of posterior or anterior cervical chain or axillae bilaterally.  Gait normal with good balance and  coordination.  MSK: Non tender with full range of motion and good stability and symmetric strength and tone of elbows, wrist, hip, knee and ankles bilaterally.  Shoulder: bilateral  Inspection reveals no abnormalities, atrophy or asymmetry.  Patient has generalized tenderness to palpation of the shoulder joint especially of the a.c. joints bilaterally  Patient has severe limited but symmetric decreased range of motion bilaterally but improved from previous exam.  Patient has external rotation to 20, internal rotation to hip, abduction to 100 bilaterally. Flexion to 110 on right and 80 on left Rotator cuff strength is weakened bilaterally to  4/5 on right and 3/5 on left I do think it is intact.  Patient does have positive severe crepitus with range of motion.  Speeds and Yergason's tests normal.  Normal scapular function observed.  No painful arc and no drop arm sign.  No apprehension sign    PMHX  X-ray show that patient does have moderate to severe osteoarthritic changes of the shoulder joints bilaterally as well as the a.c. joint. This does not appear to have a high riding humeral head which likely means rotator cuff is still intact.   Impression and Recommendations:

## 2013-04-19 NOTE — Addendum Note (Signed)
Addended by: Edwena Felty T on: 04/19/2013 10:22 AM   Modules accepted: Orders

## 2013-04-19 NOTE — Assessment & Plan Note (Signed)
Patient is clinically doing better. Patient would like to avoid surgery at all costs but no that the only curative therapy would be shoulder replacement surgery. We patient's past medical history the he would be a high-risk surgical candidate. Discussed with patient at this time that we can continue to do intra-articular steroid injections. Patient will come back on 3 month followup. At that time if he is starting to have any other pain we would consider doing intra-articular steroid injections probably under ultrasound guidance. Patient will continue to do home exercises but states he feels that he is fatigued if he tries to do all the exercises in one day. We discussed splinting them up and doing half one day in half the other day. Discussed icing protocol. Patient will come back again in 6-8 weeks and discuss another injection if necessary.

## 2013-04-19 NOTE — Patient Instructions (Signed)
I am glad you are doing better Split the exercises in 2.  Do half one day and half the next for a total of 6 days a week.  Continue everything else you are doing.  Come back in 6-8 weeks if you are having pain.

## 2013-04-22 ENCOUNTER — Ambulatory Visit: Payer: Medicare Other | Admitting: Physical Therapy

## 2013-04-25 ENCOUNTER — Telehealth: Payer: Self-pay | Admitting: Oncology

## 2013-04-25 ENCOUNTER — Ambulatory Visit (HOSPITAL_BASED_OUTPATIENT_CLINIC_OR_DEPARTMENT_OTHER): Payer: Medicare Other | Admitting: Oncology

## 2013-04-25 ENCOUNTER — Other Ambulatory Visit (HOSPITAL_BASED_OUTPATIENT_CLINIC_OR_DEPARTMENT_OTHER): Payer: Medicare Other | Admitting: Lab

## 2013-04-25 VITALS — BP 148/78 | HR 103 | Temp 97.4°F | Resp 20 | Ht 77.0 in | Wt 266.5 lb

## 2013-04-25 DIAGNOSIS — C8590 Non-Hodgkin lymphoma, unspecified, unspecified site: Secondary | ICD-10-CM

## 2013-04-25 DIAGNOSIS — C9110 Chronic lymphocytic leukemia of B-cell type not having achieved remission: Secondary | ICD-10-CM

## 2013-04-25 DIAGNOSIS — Z452 Encounter for adjustment and management of vascular access device: Secondary | ICD-10-CM

## 2013-04-25 DIAGNOSIS — C8599 Non-Hodgkin lymphoma, unspecified, extranodal and solid organ sites: Secondary | ICD-10-CM

## 2013-04-25 DIAGNOSIS — R109 Unspecified abdominal pain: Secondary | ICD-10-CM

## 2013-04-25 LAB — COMPREHENSIVE METABOLIC PANEL (CC13)
Albumin: 3.8 g/dL (ref 3.5–5.0)
Alkaline Phosphatase: 87 U/L (ref 40–150)
BUN: 36.9 mg/dL — ABNORMAL HIGH (ref 7.0–26.0)
Creatinine: 1.7 mg/dL — ABNORMAL HIGH (ref 0.7–1.3)
Glucose: 288 mg/dl — ABNORMAL HIGH (ref 70–140)
Potassium: 4.9 mEq/L (ref 3.5–5.1)

## 2013-04-25 LAB — CBC WITH DIFFERENTIAL/PLATELET
Basophils Absolute: 0 10*3/uL (ref 0.0–0.1)
Eosinophils Absolute: 0.4 10*3/uL (ref 0.0–0.5)
HCT: 35.1 % — ABNORMAL LOW (ref 38.4–49.9)
HGB: 12.3 g/dL — ABNORMAL LOW (ref 13.0–17.1)
LYMPH%: 15.3 % (ref 14.0–49.0)
MCV: 86.9 fL (ref 79.3–98.0)
MONO%: 8.5 % (ref 0.0–14.0)
NEUT#: 4.9 10*3/uL (ref 1.5–6.5)
NEUT%: 69.7 % (ref 39.0–75.0)
Platelets: 233 10*3/uL (ref 140–400)

## 2013-04-25 MED ORDER — HEPARIN SOD (PORK) LOCK FLUSH 100 UNIT/ML IV SOLN
500.0000 [IU] | Freq: Once | INTRAVENOUS | Status: AC
  Administered 2013-04-25: 500 [IU] via INTRAVENOUS
  Filled 2013-04-25: qty 5

## 2013-04-25 MED ORDER — SODIUM CHLORIDE 0.9 % IJ SOLN
10.0000 mL | INTRAMUSCULAR | Status: AC | PRN
  Administered 2013-04-25: 10 mL via INTRAVENOUS
  Filled 2013-04-25: qty 10

## 2013-04-25 NOTE — Addendum Note (Signed)
Addended by: Reesa Chew on: 04/25/2013 11:02 AM   Modules accepted: Orders, SmartSet

## 2013-04-25 NOTE — Progress Notes (Signed)
Hematology and Oncology Follow Up Visit  [name] [name] [id number] [birth year] 67 y.o. 04/25/2013 10:03 AM   Principle Diagnosis: 67 year old with stage IIIA SLL diagnosed in 06/2012. He presented with abdominal pain and diffuse lymphadenopathy.   Prior Therapy:  He is S/P lymph node biopsy that was done on 07/13/2012.  He is S/P 6 cycles of B-R started in 07/2012. He completed cycle 6 on 12/2012.    Current therapy: Observation and follow up.   Interim History: [name] [name] presents today for a follow up visit. He is a nice man with the above diagnosis. He presented with bulky adenopathy above and below the diaphragm. He tolerated chemotherapy without complications. He has done well after the completion of chemotherapy but in the last 2-3 weeks he have developed intermittent abdominal fullness and vague abdominal pain.  He is able to eat and drink but little amounts he is not reporting any nausea or vomiting is not reporting any fevers or chills. He is reporting constipation which she is using MiraLAX with some success.   Medications: I have reviewed the patient's current medications. Current Outpatient Prescriptions  Medication Sig Dispense Refill  . Alum & Mag Hydroxide-Simeth (GI COCKTAIL) SUSP suspension Take 30 mLs by mouth 3 (three) times daily as needed for indigestion. Shake well.  300 mL  0  . aspirin EC 81 MG EC tablet Take 1 tablet (81 mg total) by mouth daily.      Marland Kitchen atorvastatin (LIPITOR) 20 MG tablet Take 1 tablet (20 mg total) by mouth daily at 6 PM.  30 tablet  5  . carvedilol (COREG) 12.5 MG tablet Take 1 tablet (12.5 mg total) by mouth 2 (two) times daily.  180 tablet  3  . glipiZIDE (GLUCOTROL) 10 MG tablet Take 1 tablet (10 mg total) by mouth 2 (two) times daily before a meal.  60 tablet  3  . lisinopril (PRINIVIL,ZESTRIL) 5 MG tablet TAKE ONE TABLET BY MOUTH EVERY DAY  30 tablet  11  . metFORMIN (GLUCOPHAGE) 500 MG tablet Take 1,000 mg by mouth 2 (two) times daily with a  meal.      . nicotine (NICODERM CQ - DOSED IN MG/24 HOURS) 14 mg/24hr patch Place 1 patch onto the skin daily.      . ondansetron (ZOFRAN) 8 MG tablet Take 8 mg by mouth every 8 (eight) hours as needed for nausea.      Marland Kitchen oxymetazoline (AFRIN) 0.05 % nasal spray Place 2 sprays into the nose 2 (two) times daily as needed. For nasal congestion      . potassium chloride (K-DUR) 10 MEQ tablet Take 1 tablet (10 mEq total) by mouth daily.  30 tablet  5  . tiotropium (SPIRIVA) 18 MCG inhalation capsule Place 1 capsule (18 mcg total) into inhaler and inhale daily.  30 capsule  6  . torsemide (DEMADEX) 20 MG tablet Take 1 tablet (20 mg total) by mouth 2 (two) times daily.  60 tablet  5  . Umeclidinium-Vilanterol (ANORO ELLIPTA) 62.5-25 MCG/INH AEPB Inhale 1 puff into the lungs daily.  30 each  5   No current facility-administered medications for this visit.     Allergies: No Known Allergies  Past Medical History, Surgical history, Social history, and Family History were reviewed and updated.  Review of Systems:  Remaining ROS negative.  Physical Exam: Blood pressure 148/78, pulse 103, temperature 97.4 F (36.3 C), temperature source Oral, resp. rate 20, height 6\' 5"  (1.956 m), weight 266 lb 8  oz (120.884 kg). ECOG: 1 General appearance: alert Head: Normocephalic, without obvious abnormality, atraumatic Neck: no adenopathy, no carotid bruit, no JVD, supple, symmetrical, trachea midline and thyroid not enlarged, symmetric, no tenderness/mass/nodules Lymph nodes: Cervical, supraclavicular, and axillary nodes normal. Heart:regular rate and rhythm, S1, S2 normal, no murmur, click, rub or gallop Lung:chest clear, no wheezing, rales, normal symmetric air entry Abdomen: soft, non-tender, without masses or organomegaly EXT:no erythema, induration, or nodules No lymph nodes noted on exam today.   Lab Results: Lab Results  Component Value Date   WBC 7.0 04/25/2013   HGB 12.3* 04/25/2013   HCT 35.1*  04/25/2013   MCV 86.9 04/25/2013   PLT 233 04/25/2013     Chemistry      Component Value Date/Time   NA 135 03/20/2013 1024   NA 137 01/29/2013 0838   K 4.1 03/20/2013 1024   K 4.2 01/29/2013 0838   CL 101 03/20/2013 1024   CL 106 12/19/2012 0832   CO2 26 03/20/2013 1024   CO2 24 01/29/2013 0838   BUN 20 03/20/2013 1024   BUN 16.4 01/29/2013 0838   CREATININE 1.4 03/20/2013 1024   CREATININE 1.2 01/29/2013 0838      Component Value Date/Time   CALCIUM 9.4 03/20/2013 1024   CALCIUM 9.5 01/29/2013 0838   ALKPHOS 75 02/25/2013 1005   ALKPHOS 80 01/29/2013 0838   AST 14 02/25/2013 1005   AST 16 01/29/2013 0838   ALT 15 02/25/2013 1005   ALT 13 01/29/2013 0838   BILITOT 0.4 02/25/2013 1005   BILITOT 0.61 01/29/2013 0838      Impression and Plan:  This is a 67 year old gentleman with the following issues: 1. Chronic lymphocytic leukemia/small lymphocytic lymphoma. He is S/P B-R chemotherapy. He is toleratedt well without complications. CT on 01/29/2013 showed complete response to therapy.  We will continue observation and surveillance and certainly initiate chemotherapy again upon the relapse. I don't see any evidence of relapse but his abdominal pain could be due to that and that will be investigated as such.   2. Abdominal pain: Unclear etiology the differential diagnosis was discussed today which include a relapse of his lymphoma which we will investigate by imaging studies with a CT scan abdomen and pelvis. This could also due to pancreatitis, peptic ulcer disease or diverticular disease. He is overdue for a colonoscopy in our refer her to gastroenterology for an evaluation as well.  3. Nausea prophylaxis. on Zofran without major issues.   4. Shortness of breath:  this has improved at this time.  4. Shoulder pain: Due to osteoarthritis. Improved now.   5. Follow up: In 6 weeks for port flush and a clinical visit.       Evyn Kooyman 10/9/201410:03 AM

## 2013-04-25 NOTE — Telephone Encounter (Signed)
gv and printed appt appt sched and avs for pt for OCT and NOV...gv pt sched for Dr. Leone Payor  for NOV 5 @ 9:45am  gv pt barium

## 2013-04-30 ENCOUNTER — Ambulatory Visit (HOSPITAL_COMMUNITY)
Admission: RE | Admit: 2013-04-30 | Discharge: 2013-04-30 | Disposition: A | Discharge status: Home / Self Care | Payer: Medicare Other | Source: Ambulatory Visit | Attending: Oncology | Admitting: Oncology

## 2013-04-30 ENCOUNTER — Encounter: Payer: Self-pay | Admitting: Internal Medicine

## 2013-04-30 ENCOUNTER — Other Ambulatory Visit: Payer: Self-pay

## 2013-04-30 ENCOUNTER — Ambulatory Visit (INDEPENDENT_AMBULATORY_CARE_PROVIDER_SITE_OTHER): Payer: Medicare Other | Admitting: Internal Medicine

## 2013-04-30 ENCOUNTER — Encounter (HOSPITAL_COMMUNITY): Payer: Self-pay

## 2013-04-30 VITALS — BP 124/84 | HR 89 | Ht 77.0 in | Wt 266.0 lb

## 2013-04-30 DIAGNOSIS — K5792 Diverticulitis of intestine, part unspecified, without perforation or abscess without bleeding: Secondary | ICD-10-CM

## 2013-04-30 DIAGNOSIS — I359 Nonrheumatic aortic valve disorder, unspecified: Secondary | ICD-10-CM | POA: Insufficient documentation

## 2013-04-30 DIAGNOSIS — I2510 Atherosclerotic heart disease of native coronary artery without angina pectoris: Secondary | ICD-10-CM | POA: Insufficient documentation

## 2013-04-30 DIAGNOSIS — C8589 Other specified types of non-Hodgkin lymphoma, extranodal and solid organ sites: Secondary | ICD-10-CM | POA: Insufficient documentation

## 2013-04-30 DIAGNOSIS — K5732 Diverticulitis of large intestine without perforation or abscess without bleeding: Secondary | ICD-10-CM

## 2013-04-30 DIAGNOSIS — C8590 Non-Hodgkin lymphoma, unspecified, unspecified site: Secondary | ICD-10-CM

## 2013-04-30 DIAGNOSIS — K5730 Diverticulosis of large intestine without perforation or abscess without bleeding: Secondary | ICD-10-CM | POA: Insufficient documentation

## 2013-04-30 MED ORDER — TRAMADOL HCL 50 MG PO TABS: 50.0000 mg | ORAL_TABLET | Freq: Three times a day (TID) | ORAL | Status: DC | PRN

## 2013-04-30 MED ORDER — CIPROFLOXACIN HCL 500 MG PO TABS: 500.0000 mg | ORAL_TABLET | Freq: Two times a day (BID) | ORAL | Status: DC

## 2013-04-30 MED ORDER — METRONIDAZOLE 500 MG PO TABS: 500.0000 mg | ORAL_TABLET | Freq: Three times a day (TID) | ORAL | Status: DC

## 2013-04-30 MED ORDER — IOHEXOL 300 MG/ML  SOLN
100.0000 mL | Freq: Once | INTRAMUSCULAR | Status: AC | PRN
  Administered 2013-04-30: 100 mL via INTRAVENOUS

## 2013-04-30 NOTE — Telephone Encounter (Signed)
Rx was faxed to [facility] [place].

## 2013-04-30 NOTE — Patient Instructions (Signed)
Diverticulitis °A diverticulum is a small pouch or sac on the colon. Diverticulosis is the presence of these diverticula on the colon. Diverticulitis is the irritation (inflammation) or infection of diverticula. °CAUSES  °The colon and its diverticula contain bacteria. If food particles block the tiny opening to a diverticulum, the bacteria inside can grow and cause an increase in pressure. This leads to infection and inflammation and is called diverticulitis. °SYMPTOMS  °· Abdominal pain and tenderness. Usually, the pain is located on the left side of your abdomen. However, it could be located elsewhere. °· Fever. °· Bloating. °· Feeling sick to your stomach (nausea). °· Throwing up (vomiting). °· Abnormal stools. °DIAGNOSIS  °Your caregiver will take a history and perform a physical exam. Since many things can cause abdominal pain, other tests may be necessary. Tests may include: °· Blood tests. °· Urine tests. °· X-ray of the abdomen. °· CT scan of the abdomen. °Sometimes, surgery is needed to determine if diverticulitis or other conditions are causing your symptoms. °TREATMENT  °Most of the time, you can be treated without surgery. Treatment includes: °· Resting the bowels by only having liquids for a few days. As you improve, you will need to eat a low-fiber diet. °· Intravenous (IV) fluids if you are losing body fluids (dehydrated). °· Antibiotic medicines that treat infections may be given. °· Pain and nausea medicine, if needed. °· Surgery if the inflamed diverticulum has burst. °HOME CARE INSTRUCTIONS  °· Try a clear liquid diet (broth, tea, or water for as long as directed by your caregiver). You may then gradually begin a low-fiber diet as tolerated.  °A low-fiber diet is a diet with less than 10 grams of fiber. Choose the foods below to reduce fiber in the diet: °· White breads, cereals, rice, and pasta. °· Cooked fruits and vegetables or soft fresh fruits and vegetables without the skin. °· Ground or  well-cooked tender beef, ham, veal, lamb, pork, or poultry. °· Eggs and seafood. °· After your diverticulitis symptoms have improved, your caregiver may put you on a high-fiber diet. A high-fiber diet includes 14 grams of fiber for every 1000 calories consumed. For a standard 2000 calorie diet, you would need 28 grams of fiber. Follow these diet guidelines to help you increase the fiber in your diet. It is important to slowly increase the amount fiber in your diet to avoid gas, constipation, and bloating. °· Choose whole-grain breads, cereals, pasta, and brown rice. °· Choose fresh fruits and vegetables with the skin on. Do not overcook vegetables because the more vegetables are cooked, the more fiber is lost. °· Choose more nuts, seeds, legumes, dried peas, beans, and lentils. °· Look for food products that have greater than 3 grams of fiber per serving on the Nutrition Facts label. °· Take all medicine as directed by your caregiver. °· If your caregiver has given you a follow-up appointment, it is very important that you go. Not going could result in lasting (chronic) or permanent injury, pain, and disability. If there is any problem keeping the appointment, call to reschedule. °SEEK MEDICAL CARE IF:  °· Your pain does not improve. °· You have a hard time advancing your diet beyond clear liquids. °· Your bowel movements do not return to normal. °SEEK IMMEDIATE MEDICAL CARE IF:  °· Your pain becomes worse. °· You have an oral temperature above 102° F (38.9° C), not controlled by medicine. °· You have repeated vomiting. °· You have bloody or black, tarry stools. °·   Symptoms that brought you to your caregiver become worse or are not getting better. °MAKE SURE YOU:  °· Understand these instructions. °· Will watch your condition. °· Will get help right away if you are not doing well or get worse. °Document Released: 04/13/2005 Document Revised: 09/26/2011 Document Reviewed: 08/09/2010 °[employer]® Patient Information  ©2014 [employer]. ° °

## 2013-04-30 NOTE — Progress Notes (Signed)
Subjective:    Patient ID: [name] [name], male    DOB: 23-May-1946, 67 y.o.   MRN: [id number]  HPI  Pt presents to the clinic today with c/o abdominal pain, nausea and vomiting. This started about 1 week ago. He does have zofran to take for the nausea. He has not taken anything OTC for the pain. He did mention this to his oncologist last Thursday.  He did have a CT scan this am to make sure his lymphoma was not recurrent. His CT scan did not show any recurrent disease but did show an acute diverticulitis. Of note, he is past due for his colonoscopy and has been referred to Dr. Leone Payor for a colonoscopy on 05/22/2013.   Review of Systems      Past Medical History  Diagnosis Date  . Ischemic cardiomyopathy   . Type II diabetes mellitus   . Hypertension   . Hypercholesterolemia   . Heart murmur   . Shortness of breath     "all the time" (06/06/2012)  . Lower GI bleeding 1980's    "when I was drinking alot" (06/06/2012)  . Arthritis     "both shoulders" (06/06/2012)  . Chronic combined systolic and diastolic CHF (congestive heart failure) 07/10/2012  . GERD (gastroesophageal reflux disease) 07/12/2012  . OSA on CPAP     uses a cpap  . Myocardial infarction 11/13  . Coronary artery disease   . Lymphoma   . History of alcoholism   . Sleep apnea   . Aortic stenosis     Current Outpatient Prescriptions  Medication Sig Dispense Refill  . Alum & Mag Hydroxide-Simeth (GI COCKTAIL) SUSP suspension Take 30 mLs by mouth 3 (three) times daily as needed for indigestion. Shake well.  300 mL  0  . aspirin EC 81 MG EC tablet Take 1 tablet (81 mg total) by mouth daily.      Marland Kitchen atorvastatin (LIPITOR) 20 MG tablet Take 1 tablet (20 mg total) by mouth daily at 6 PM.  30 tablet  5  . carvedilol (COREG) 12.5 MG tablet Take 1 tablet (12.5 mg total) by mouth 2 (two) times daily.  180 tablet  3  . glipiZIDE (GLUCOTROL) 10 MG tablet Take 1 tablet (10 mg total) by mouth 2 (two) times daily before a meal.   60 tablet  3  . lisinopril (PRINIVIL,ZESTRIL) 5 MG tablet TAKE ONE TABLET BY MOUTH EVERY DAY  30 tablet  11  . metFORMIN (GLUCOPHAGE) 500 MG tablet Take 1,000 mg by mouth 2 (two) times daily with a meal.      . nicotine (NICODERM CQ - DOSED IN MG/24 HOURS) 14 mg/24hr patch Place 1 patch onto the skin daily.      . ondansetron (ZOFRAN) 8 MG tablet Take 8 mg by mouth every 8 (eight) hours as needed for nausea.      Marland Kitchen oxymetazoline (AFRIN) 0.05 % nasal spray Place 2 sprays into the nose 2 (two) times daily as needed. For nasal congestion      . potassium chloride (K-DUR) 10 MEQ tablet Take 1 tablet (10 mEq total) by mouth daily.  30 tablet  5  . tiotropium (SPIRIVA) 18 MCG inhalation capsule Place 1 capsule (18 mcg total) into inhaler and inhale daily.  30 capsule  6  . torsemide (DEMADEX) 20 MG tablet Take 1 tablet (20 mg total) by mouth 2 (two) times daily.  60 tablet  5  . Umeclidinium-Vilanterol (ANORO ELLIPTA) 62.5-25 MCG/INH AEPB Inhale 1 puff into  the lungs daily.  30 each  5  . ciprofloxacin (CIPRO) 500 MG tablet Take 1 tablet (500 mg total) by mouth 2 (two) times daily.  28 tablet  0  . metroNIDAZOLE (FLAGYL) 500 MG tablet Take 1 tablet (500 mg total) by mouth 3 (three) times daily.  42 tablet  0  . traMADol (ULTRAM) 50 MG tablet Take 1 tablet (50 mg total) by mouth every 8 (eight) hours as needed for pain.  30 tablet  0   No current facility-administered medications for this visit.   Facility-Administered Medications Ordered in Other Visits  Medication Dose Route Frequency Provider Last Rate Last Dose  . sodium chloride 0.9 % injection 10 mL  10 mL Intravenous PRN Benjiman Core, MD   10 mL at 04/25/13 1100    No Known Allergies  Family History  Problem Relation Age of Onset  . Osteoarthritis Mother   . Osteoarthritis Father   . Colon cancer Daughter   . Diabetes Father     entire family  . Heart disease Father     History   Social History  . Marital Status: Single     [name]    Number of Children: 2  . Years of Education: 12   Occupational History  . retired    Social History Main Topics  . Smoking status: Former Smoker -- 1.00 packs/day for 53 years    Types: Cigarettes    Quit date: 07/19/2011  . Smokeless tobacco: Never Used     Comment: pt states he does not smoke as long as he uses patch  . Alcohol Use: No     Comment: 06/06/2012 "haven't had a drink in 22 years; I'm an alcoholic"  . Drug Use: No  . Sexual Activity: No   Other Topics Concern  . 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   Lives by self, since '78   [name] in [place]     Regular exercise-no   Caffeine Use-no     Constitutional: Denies fever, malaise, fatigue, headache or abrupt weight changes.   Gastrointestinal: Pt reports abdominal pain, nausea and vomiting. Denies  bloating, constipation, diarrhea or blood in the stool.  GU: Denies urgency, frequency, pain with urination, burning sensation, blood in urine, odor or discharge.   No other specific complaints in a complete review of systems (except as listed in HPI above).  Objective:   Physical Exam   BP 124/84  Pulse 89  Ht 6\' 5"  (1.956 m)  Wt 266 lb (120.657 kg)  BMI 31.54 kg/m2  SpO2 96% Wt Readings from Last 3 Encounters:  04/30/13 266 lb (120.657 kg)  04/25/13 266 lb 8 oz (120.884 kg)  04/19/13 275 lb (124.739 kg)    General: Appears his stated age, overweight but well developed, well nourished in NAD. Cardiovascular: Normal rate and rhythm. S1,S2 noted.  No murmur, rubs or gallops noted. No JVD or BLE edema. No carotid bruits noted. Pulmonary/Chest: Normal effort and positive vesicular breath sounds. No respiratory distress. No wheezes, rales or ronchi noted.  Abdomen: Soft and tender in the LLQ. Normal bowel sounds, no bruits noted. No distention or masses noted. Liver, spleen and kidneys non palpable.   BMET    Component Value  Date/Time   NA 135* 04/25/2013 0933   NA 135 03/20/2013 1024   K 4.9 04/25/2013 0933   K 4.1 03/20/2013 1024   CL 101 03/20/2013 1024  CL 106 12/19/2012 0832   CO2 26 04/25/2013 0933   CO2 26 03/20/2013 1024   GLUCOSE 288* 04/25/2013 0933   GLUCOSE 217* 03/20/2013 1024   GLUCOSE 102* 12/19/2012 0832   BUN 36.9* 04/25/2013 0933   BUN 20 03/20/2013 1024   CREATININE 1.7* 04/25/2013 0933   CREATININE 1.4 03/20/2013 1024   CALCIUM 10.3 04/25/2013 0933   CALCIUM 9.4 03/20/2013 1024   GFRNONAA 74* 07/14/2012 0432   GFRAA 86* 07/14/2012 0432    Lipid Panel     Component Value Date/Time   CHOL 153 02/25/2013 1005   TRIG 179.0* 02/25/2013 1005   HDL 44.50 02/25/2013 1005   CHOLHDL 3 02/25/2013 1005   VLDL 35.8 02/25/2013 1005   LDLCALC 73 02/25/2013 1005    CBC    Component Value Date/Time   WBC 7.0 04/25/2013 0932   WBC 5.2 11/30/2012 1520   RBC 4.04* 04/25/2013 0932   RBC 3.93* 11/30/2012 1520   HGB 12.3* 04/25/2013 0932   HGB 11.8* 11/30/2012 1520   HCT 35.1* 04/25/2013 0932   HCT 34.7* 11/30/2012 1520   PLT 233 04/25/2013 0932   PLT 222.0 11/30/2012 1520   MCV 86.9 04/25/2013 0932   MCV 88.4 11/30/2012 1520   MCH 30.5 04/25/2013 0932   MCH 28.9 07/14/2012 0432   MCHC 35.1 04/25/2013 0932   MCHC 33.9 11/30/2012 1520   RDW 13.9 04/25/2013 0932   RDW 15.2* 11/30/2012 1520   LYMPHSABS 1.1 04/25/2013 0932   LYMPHSABS 7.7* 07/11/2012 0435   MONOABS 0.6 04/25/2013 0932   MONOABS 0.6 07/11/2012 0435   EOSABS 0.4 04/25/2013 0932   EOSABS 0.6 07/11/2012 0435   BASOSABS 0.0 04/25/2013 0932   BASOSABS 0.0 07/11/2012 0435    Hgb A1C Lab Results  Component Value Date   HGBA1C 11.9* 11/30/2012        Assessment & Plan:   Acute Diverticulitis, new onset:  Information given about diet and diverticulitis eRx for Cipro/flagyl x 2 weeks eRx for Tramadol for pain Keep your appt with Dr. Leone Payor for your colonoscopy  RTC as needed or if pain persist/worsens or if you develop fever or uncontrollable diarrhea

## 2013-05-08 ENCOUNTER — Encounter: Payer: Self-pay | Admitting: Cardiovascular Disease

## 2013-05-08 ENCOUNTER — Ambulatory Visit (INDEPENDENT_AMBULATORY_CARE_PROVIDER_SITE_OTHER): Payer: Medicare Other | Admitting: Cardiovascular Disease

## 2013-05-08 VITALS — BP 110/80 | HR 92 | Ht 75.0 in | Wt 263.0 lb

## 2013-05-08 DIAGNOSIS — I350 Nonrheumatic aortic (valve) stenosis: Secondary | ICD-10-CM

## 2013-05-08 DIAGNOSIS — I359 Nonrheumatic aortic valve disorder, unspecified: Secondary | ICD-10-CM

## 2013-05-08 DIAGNOSIS — I509 Heart failure, unspecified: Secondary | ICD-10-CM

## 2013-05-08 DIAGNOSIS — I5042 Chronic combined systolic (congestive) and diastolic (congestive) heart failure: Secondary | ICD-10-CM

## 2013-05-08 NOTE — Patient Instructions (Signed)
Your physician wants you to follow-up in: 6 months  You will receive a reminder letter in the mail two months in advance. If you don't receive a letter, please call our office to schedule the follow-up appointment.   Your physician recommends that you continue on your current medications as directed. Please refer to the Current Medication list given to you today.   REDUCE HIGH SODIUM FOODS LIKE CANNED SOUP, GRAVY, SAUCES, READY PREPARED FOODS LIKE FROZEN FOODS; LEAN CUISINE, LASAGNA. BACON, SAUSAGE, LUNCH MEAT, FAST FOODS, HOT DOGS, CHIPS, PIZZA, CHINESE FOOD, SOY SAUCE, STORE BOUGHT FRIED CHICKEN= [place] FRIED CHICKEN/ [employer]ES.

## 2013-05-08 NOTE — Assessment & Plan Note (Signed)
He appears to be stable.

## 2013-05-08 NOTE — Progress Notes (Signed)
Ames Coupe Date of Birth  [birth year]       [facility] Office [address]  [address], [address] [place], [place]  [address]   [place], [place]  [address] [phone]     [phone]   Fax  [phone]    Fax [phone]  Problem List: 1. Coronary artery disease 2. Ischemic cardiopathy with an ejection fraction of around 35% 3. Aortic stenosis - moderate 4.  Non hodgekins lymphoma 5. Type 2 diabetes mellitus 6.  Sleep apnea  History of Present Illness:  Mr. Hyle is a 67 yo with hx of CAD, CHF.  He has been seen at [facility] for the past year.  He has had progressive dyspnea for the past 4 months.   He has had increased swelling in his legs and ankles.  He has not been able to get back in with [facility] and wanted to come here for further evaluation.    He had 2 stents placed in Oct. 2013.  He did well for 6 months.  Then his condition started to decline - porgressive , severe DOE with any exertion.  Echo recently showed. Left ventricle: The cavity size was mildly dilated. Wall thickness was normal. Systolic function was mildly to  moderately reduced. The estimated ejection fraction was in the range of 40% to 45%. There is akinesis of the posterior and distal lateral myocardium; inferior hypokinesis. Doppler parameters are consistent with abnormal left ventricular relaxation (grade 1 diastolic dysfunction). - Mitral valve: Mild regurgitation. - Left atrium: The atrium was mildly dilated. PA pressures of 62.  January 23, 2013: Mr. Brallier is doing much better.  We started him on torsemide and he has diuresed quite nicely.  He has lost 17 pounds in the past 2 weeks.  He is still dyspneic with exertion.  He is trying to avoid salt but is having some challenges. No CP.   No PND.  No significant edema.  February 25, 2013: Mr. Hausen is dong well.  Breathing is better.  He is having some foot / toe pain.  Needs to keep his toenails  trimmed better.    Oct. 22, 2014:  Mr. Lozito is doing well.  Recently diagnosed with diverticulosis.    Current Outpatient Prescriptions on File Prior to Visit  Medication Sig Dispense Refill  . Alum & Mag Hydroxide-Simeth (GI COCKTAIL) SUSP suspension Take 30 mLs by mouth 3 (three) times daily as needed for indigestion. Shake well.  300 mL  0  . aspirin EC 81 MG EC tablet Take 1 tablet (81 mg total) by mouth daily.      Marland Kitchen atorvastatin (LIPITOR) 20 MG tablet Take 1 tablet (20 mg total) by mouth daily at 6 PM.  30 tablet  5  . carvedilol (COREG) 12.5 MG tablet Take 1 tablet (12.5 mg total) by mouth 2 (two) times daily.  180 tablet  3  . ciprofloxacin (CIPRO) 500 MG tablet Take 1 tablet (500 mg total) by mouth 2 (two) times daily.  28 tablet  0  . glipiZIDE (GLUCOTROL) 10 MG tablet Take 1 tablet (10 mg total) by mouth 2 (two) times daily before a meal.  60 tablet  3  . lisinopril (PRINIVIL,ZESTRIL) 5 MG tablet TAKE ONE TABLET BY MOUTH EVERY DAY  30 tablet  11  . metFORMIN (GLUCOPHAGE) 500 MG tablet Take 1,000 mg by mouth 2 (two) times daily with a meal.      .  metroNIDAZOLE (FLAGYL) 500 MG tablet Take 1 tablet (500 mg total) by mouth 3 (three) times daily.  42 tablet  0  . nicotine (NICODERM CQ - DOSED IN MG/24 HOURS) 14 mg/24hr patch Place 1 patch onto the skin daily.      . ondansetron (ZOFRAN) 8 MG tablet Take 8 mg by mouth every 8 (eight) hours as needed for nausea.      . potassium chloride (K-DUR) 10 MEQ tablet Take 1 tablet (10 mEq total) by mouth daily.  30 tablet  5  . torsemide (DEMADEX) 20 MG tablet Take 1 tablet (20 mg total) by mouth 2 (two) times daily.  60 tablet  5  . traMADol (ULTRAM) 50 MG tablet Take 1 tablet (50 mg total) by mouth every 8 (eight) hours as needed for pain.  30 tablet  0  . oxymetazoline (AFRIN) 0.05 % nasal spray Place 2 sprays into the nose 2 (two) times daily as needed. For nasal congestion      . tiotropium (SPIRIVA) 18 MCG inhalation capsule Place 1  capsule (18 mcg total) into inhaler and inhale daily.  30 capsule  6  . Umeclidinium-Vilanterol (ANORO ELLIPTA) 62.5-25 MCG/INH AEPB Inhale 1 puff into the lungs daily.  30 each  5   Current Facility-Administered Medications on File Prior to Visit  Medication Dose Route Frequency Provider Last Rate Last Dose  . sodium chloride 0.9 % injection 10 mL  10 mL Intravenous PRN Benjiman Core, MD   10 mL at 04/25/13 1100    No Known Allergies  Past Medical History  Diagnosis Date  . Ischemic cardiomyopathy   . Type II diabetes mellitus   . Hypertension   . Hypercholesterolemia   . Heart murmur   . Shortness of breath     "all the time" (06/06/2012)  . Lower GI bleeding 1980's    "when I was drinking alot" (06/06/2012)  . Arthritis     "both shoulders" (06/06/2012)  . Chronic combined systolic and diastolic CHF (congestive heart failure) 07/10/2012  . GERD (gastroesophageal reflux disease) 07/12/2012  . OSA on CPAP     uses a cpap  . Myocardial infarction 11/13  . Coronary artery disease   . Lymphoma   . History of alcoholism   . Sleep apnea   . Aortic stenosis     Past Surgical History  Procedure Laterality Date  . Coronary angioplasty with stent placement  06/06/2012    "1"  . Lymph gland excision  07/13/2012    Procedure: CERVICAL LYMPH GLAND EXCISION;  Surgeon: Currie Paris, MD;  Location: [facility] OR;  Service: General;  Laterality: Right;  . Portacath placement  07/30/2012    Procedure: INSERTION PORT-A-CATH;  Surgeon: Currie Paris, MD;  Location: [facility];  Service: General;  Laterality: Right;    History  Smoking status  . Former Smoker -- 1.00 packs/day for 53 years  . Types: Cigarettes  . Quit date: 07/19/2011  Smokeless tobacco  . Never Used    Comment: pt states he does not smoke as long as he uses patch    History  Alcohol Use No    Comment: 06/06/2012 "haven't had a drink in 22 years; I'm an alcoholic"    Family History  Problem  Relation Age of Onset  . Osteoarthritis Mother   . Osteoarthritis Father   . Colon cancer Daughter   . Diabetes Father     entire family  . Heart disease Father  Reviw of Systems:  Reviewed in the HPI.  All other systems are negative.  Physical Exam: Blood pressure 110/80, pulse 92, height 6\' 3"  (1.905 m), weight 263 lb (119.296 kg). General: Well developed, well nourished, in no acute distress.    Head: Normocephalic, atraumatic, sclera non-icteric, mucus membranes are moist,   Neck: Supple. Carotids are 2 + without bruits. JVD 10-14 cm  Lungs: Clear   Heart: RR, 2-3/6 systolic murmur, no S3 heard today  Abdomen: Soft, non-tender, slightly edematous  Msk:  Strength and tone are normal   Extremities: No clubbing or cyanosis. No  edema,     Neuro: CN II - XII intact.  Alert and oriented X 3.   Psych:  Normal   ECG:   Assessment / Plan:

## 2013-05-08 NOTE — Assessment & Plan Note (Addendum)
He seems to be doing much better.  No nearly as short of breath.  His BP is marginal but I think we can increase his ACE-I slightly.  Will increase his Lisinopril to 10 mg a day.   Will have him return for a nurse visit to check BP and BMP in 3-4 weeks.   Can increase coreg or try low dose Aldactone at his next visit.

## 2013-05-09 ENCOUNTER — Telehealth: Payer: Self-pay | Admitting: Cardiovascular Disease

## 2013-05-09 DIAGNOSIS — I509 Heart failure, unspecified: Secondary | ICD-10-CM

## 2013-05-09 MED ORDER — LISINOPRIL 10 MG PO TABS: 10.0000 mg | ORAL_TABLET | Freq: Every day | ORAL | Status: DC

## 2013-05-09 NOTE — Telephone Encounter (Signed)
Per Dr Elease Hashimoto pt is to increase lisinopril to 10 mg daily/ bmet in 3-4 weeks, pt verbalized understanding/ app made for lab.

## 2013-05-09 NOTE — Telephone Encounter (Signed)
Follow up     Pt would like a call back about his medicines       Thanks!

## 2013-05-10 ENCOUNTER — Telehealth: Payer: Self-pay | Admitting: Pulmonary Disease

## 2013-05-10 DIAGNOSIS — G4733 Obstructive sleep apnea (adult) (pediatric): Secondary | ICD-10-CM

## 2013-05-10 NOTE — Telephone Encounter (Signed)
Pt states that he would like to have O2 D/C and picked up. Pt not used O2 x 1-2 weeks Pt states that he was seen by his PCP a few days ago and they told him his lungs were clear, no PNA. Pt states that the only reason he was placed on the O2 was because he had PNA. I explained to the pt that he was placed on O2 d/t the results of his O2 check we did on CPAP. According to ONO his O2 dropped even while using the CPAP. (see msg below)   Barbaraann Share, MD at 01/22/2013 8:41 AM   Status: Signed            Please let pt know that his oxygen level still falls despite cpap when off oxygen.  Needs to wear his oxygen at night with his cpap at 2 liters.    Pt states that during that test he still had PNA and his breathing was bad but now he feels good and does not want the O2 any longer.   Aware that [facility] out of office until first week in November. Pt would like to D/C--needs order sent to DME: [employer] to D/C once [facility] returns. Please advise Dr Shelle Iron. Thanks.

## 2013-05-20 NOTE — Telephone Encounter (Signed)
Ok to d/c oxygen, but make sure pt knows this goes against my medical advice.

## 2013-05-21 NOTE — Telephone Encounter (Signed)
He isn't supposed to wear the oxygen under the cpap mask.  It is supposed to be piped in thru the machine or a special adapter on the outside of his mask.  Please send message or order to dme for them to work with him on this.

## 2013-05-21 NOTE — Telephone Encounter (Signed)
I spoke with pt and he is aware. Order sent to pt DME

## 2013-05-21 NOTE — Telephone Encounter (Signed)
Called, spoke with pt. - Explained below to him per [facility].  Pt states he has just started to walk around his house for exercise.  Pt reports he has noticed he isn't in as good of shape as he thought.  Because of this, reports he would like to keep o2 at this time.  However, pt states he cannot get a good seal with the cpap mask bc of the o2.  So, he hasn't been wearing the two together as directed below.  KC, pls advise if you have any recs.  Thank you.  Also, pt would like KC to know he is back on Spiriva.  He thinks this is helping as well.

## 2013-05-22 ENCOUNTER — Encounter: Payer: Self-pay | Admitting: Internal Medicine

## 2013-05-22 ENCOUNTER — Ambulatory Visit (INDEPENDENT_AMBULATORY_CARE_PROVIDER_SITE_OTHER): Payer: Medicare Other | Admitting: Internal Medicine

## 2013-05-22 ENCOUNTER — Ambulatory Visit: Payer: Medicare Other | Admitting: Internal Medicine

## 2013-05-22 VITALS — BP 94/62 | HR 80 | Ht 72.0 in | Wt 271.0 lb

## 2013-05-22 DIAGNOSIS — Z1211 Encounter for screening for malignant neoplasm of colon: Secondary | ICD-10-CM

## 2013-05-22 DIAGNOSIS — K5732 Diverticulitis of large intestine without perforation or abscess without bleeding: Secondary | ICD-10-CM

## 2013-05-22 DIAGNOSIS — I700 Atherosclerosis of aorta: Secondary | ICD-10-CM

## 2013-05-22 DIAGNOSIS — Z9981 Dependence on supplemental oxygen: Secondary | ICD-10-CM

## 2013-05-22 MED ORDER — NA SULFATE-K SULFATE-MG SULF 17.5-3.13-1.6 GM/177ML PO SOLN: ORAL | Status: DC

## 2013-05-22 NOTE — Progress Notes (Signed)
Subjective:    Patient ID: [name] [name], male    DOB: 06/27/1946, 67 y.o.   MRN: [id number]  HPI Patient returns, saw him earlier in the year. 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 He is completed that and is in remission at this time. He did recently have abdominal pain diagnosed as diverticulitis, though CT scan didn't show it he improved with antibiotic treatment. He feels well at this time, with stable dyspnea and overall health status. No Known Allergies Outpatient Prescriptions Prior to Visit  Medication Sig Dispense Refill  . Alum & Mag Hydroxide-Simeth (GI COCKTAIL) SUSP suspension Take 30 mLs by mouth 3 (three) times daily as needed for indigestion. Shake well.  300 mL  0  . aspirin EC 81 MG EC tablet Take 1 tablet (81 mg total) by mouth daily.      . atorvastatin (LIPITOR) 20 MG tablet Take 1 tablet (20 mg total) by mouth daily at 6 PM.  30 tablet  5  . carvedilol (COREG) 12.5 MG tablet Take 1 tablet (12.5 mg total) by mouth 2 (two) times daily.  180 tablet  3  . glipiZIDE (GLUCOTROL) 10 MG tablet Take 1 tablet (10 mg total) by mouth 2 (two) times daily before a meal.  60 tablet  3  . lisinopril (PRINIVIL,ZESTRIL) 10 MG tablet Take 1 tablet (10 mg total) by mouth daily.  30 tablet  11  . metFORMIN (GLUCOPHAGE) 500 MG tablet Take 1,000 mg by mouth 2 (two) times daily with a meal.      . nicotine (NICODERM CQ - DOSED IN MG/24 HOURS) 14 mg/24hr patch Place 1 patch onto the skin daily.      . potassium chloride (K-DUR) 10 MEQ tablet Take 1 tablet (10 mEq total) by mouth daily.  30 tablet  5  . tiotropium (SPIRIVA) 18 MCG inhalation capsule Place 1 capsule (18 mcg total) into inhaler and inhale daily.  30 capsule  6  . torsemide (DEMADEX) 20 MG tablet Take 1 tablet (20 mg total) by mouth 2 (two) times daily.  60 tablet  5  . ciprofloxacin (CIPRO) 500 MG tablet Take 1 tablet (500 mg total) by mouth 2 (two) times daily.  28 tablet  0  .  metroNIDAZOLE (FLAGYL) 500 MG tablet Take 1 tablet (500 mg total) by mouth 3 (three) times daily.  42 tablet  0  . ondansetron (ZOFRAN) 8 MG tablet Take 8 mg by mouth every 8 (eight) hours as needed for nausea.      . oxymetazoline (AFRIN) 0.05 % nasal spray Place 2 sprays into the nose 2 (two) times daily as needed. For nasal congestion      . traMADol (ULTRAM) 50 MG tablet Take 1 tablet (50 mg total) by mouth every 8 (eight) hours as needed for pain.  30 tablet  0  . Umeclidinium-Vilanterol (ANORO ELLIPTA) 62.5-25 MCG/INH AEPB Inhale 1 puff into the lungs daily.  30 each  5   Facility-Administered Medications Prior to Visit  Medication Dose Route Frequency Provider Last Rate Last Dose  . sodium chloride 0.9 % injection 10 mL  10 mL Intravenous PRN Firas N Shadad, MD   10 mL at 04/25/13 1100   Past Medical History  Diagnosis Date  . Ischemic cardiomyopathy   . Type II diabetes mellitus   . Hypertension   . Hypercholesterolemia   . Heart murmur   . Shortness of breath     "all the   time" (06/06/2012)  . Lower GI bleeding 1980's    "when I was drinking alot" (06/06/2012)  . Arthritis     "both shoulders" (06/06/2012)  . Chronic combined systolic and diastolic CHF (congestive heart failure) 07/10/2012  . GERD (gastroesophageal reflux disease) 07/12/2012  . OSA on CPAP     uses a cpap  . Myocardial infarction 11/13  . Coronary artery disease   . Lymphoma   . History of alcoholism   . Sleep apnea   . Aortic stenosis   . Pancreatitis   . Diverticulosis   . Pneumonia    Past Surgical History  Procedure Laterality Date  . Coronary angioplasty with stent placement  06/06/2012    "1"  . Lymph gland excision  07/13/2012    Procedure: CERVICAL LYMPH GLAND EXCISION;  Surgeon: Christian J Streck, MD;  Location: [facility] OR;  Service: General;  Laterality: Right;  . Portacath placement  07/30/2012    Procedure: INSERTION PORT-A-CATH;  Surgeon: Christian J Streck, MD;  Location: [facility];  Service: General;  Laterality: Right;   History   Social History  . Marital Status: Single    [name]    Number of Children: 2  . Years of Education: 12   Occupational History  . retired    Social History Main Topics  . Smoking status: Former Smoker -- 1.00 packs/day for 53 years    Types: Cigarettes    Quit date: 07/19/2011  . Smokeless tobacco: Never Used     Comment: pt states he does not smoke as long as he uses patch  . Alcohol Use: No     Comment: 06/06/2012 "haven't had a drink in 22 years; I'm an alcoholic"  . 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 Osteoarthritis Mother   . Osteoarthritis Father   . Colon cancer Daughter   . Diabetes Father     entire family  . Heart disease Father    Review of Systems As per history of present illness    Objective:   Physical Exam General:  NAD Lungs:  clear Heart:  S1S2 3/6 harsh sem RUSB Abdomen:  soft and nontender, BS+  Data Reviewed:  CT scan Oct PCP notes Oncology notes    Assessment & Plan:   1. Special screening for malignant neoplasms, colon   2. Abdominal aortic atherosclerosis   3. On home oxygen therapy   4. Diverticulitis of colon (without mention of hemorrhage)      

## 2013-05-22 NOTE — Patient Instructions (Signed)
You have been scheduled for a colonoscopy at [facility]. Please follow written instructions given to you at your visit today.  Please pick up your prep kit at the pharmacy within the next 1-3 days. If you use inhalers (even only as needed), please bring them with you on the day of your procedure. Your physician has requested that you go to www.startemmi.com and enter the access code given to you at your visit today. This web site gives a general overview about your procedure. However, you should still follow specific instructions given to you by our office regarding your preparation for the procedure.  I appreciate the opportunity to care for you.

## 2013-05-22 NOTE — Assessment & Plan Note (Signed)
Schedule screening colonoscopy. The risks and benefits as well as alternatives of endoscopic procedure(s) have been discussed and reviewed. All questions answered. The patient agrees to proceed. Will do at hospital as he is on home )2. Better safety net for his co-morbidities.

## 2013-05-22 NOTE — Assessment & Plan Note (Signed)
Clinical dx - appears to have responded yo antibiotics

## 2013-05-24 ENCOUNTER — Encounter: Payer: Self-pay | Admitting: Internal Medicine

## 2013-05-28 ENCOUNTER — Encounter (HOSPITAL_COMMUNITY)
Admission: RE | Disposition: A | Discharge status: Home / Self Care | Payer: Self-pay | Source: Ambulatory Visit | Attending: Internal Medicine

## 2013-05-28 ENCOUNTER — Ambulatory Visit (HOSPITAL_COMMUNITY)
Admission: RE | Admit: 2013-05-28 | Discharge: 2013-05-28 | Disposition: A | Discharge status: Home / Self Care | Payer: Medicare Other | Source: Ambulatory Visit | Attending: Internal Medicine | Admitting: Internal Medicine

## 2013-05-28 ENCOUNTER — Encounter (HOSPITAL_COMMUNITY): Payer: Self-pay | Admitting: *Deleted

## 2013-05-28 DIAGNOSIS — Z860100 Personal history of colon polyps, unspecified: Secondary | ICD-10-CM | POA: Diagnosis present

## 2013-05-28 DIAGNOSIS — E7800 Pure hypercholesterolemia, unspecified: Secondary | ICD-10-CM | POA: Insufficient documentation

## 2013-05-28 DIAGNOSIS — I5042 Chronic combined systolic (congestive) and diastolic (congestive) heart failure: Secondary | ICD-10-CM | POA: Insufficient documentation

## 2013-05-28 DIAGNOSIS — E119 Type 2 diabetes mellitus without complications: Secondary | ICD-10-CM | POA: Insufficient documentation

## 2013-05-28 DIAGNOSIS — Z9861 Coronary angioplasty status: Secondary | ICD-10-CM | POA: Insufficient documentation

## 2013-05-28 DIAGNOSIS — I252 Old myocardial infarction: Secondary | ICD-10-CM | POA: Insufficient documentation

## 2013-05-28 DIAGNOSIS — Z87891 Personal history of nicotine dependence: Secondary | ICD-10-CM | POA: Insufficient documentation

## 2013-05-28 DIAGNOSIS — D128 Benign neoplasm of rectum: Secondary | ICD-10-CM

## 2013-05-28 DIAGNOSIS — G4733 Obstructive sleep apnea (adult) (pediatric): Secondary | ICD-10-CM | POA: Insufficient documentation

## 2013-05-28 DIAGNOSIS — I10 Essential (primary) hypertension: Secondary | ICD-10-CM | POA: Insufficient documentation

## 2013-05-28 DIAGNOSIS — K5730 Diverticulosis of large intestine without perforation or abscess without bleeding: Secondary | ICD-10-CM

## 2013-05-28 DIAGNOSIS — K219 Gastro-esophageal reflux disease without esophagitis: Secondary | ICD-10-CM | POA: Insufficient documentation

## 2013-05-28 DIAGNOSIS — Z1211 Encounter for screening for malignant neoplasm of colon: Secondary | ICD-10-CM

## 2013-05-28 DIAGNOSIS — I509 Heart failure, unspecified: Secondary | ICD-10-CM | POA: Insufficient documentation

## 2013-05-28 HISTORY — DX: Personal history of colon polyps, unspecified: Z86.0100

## 2013-05-28 HISTORY — DX: Personal history of colonic polyps: Z86.010

## 2013-05-28 HISTORY — PX: COLONOSCOPY: SHX5424

## 2013-05-28 SURGERY — COLONOSCOPY
Anesthesia: Moderate Sedation

## 2013-05-28 MED ORDER — FENTANYL CITRATE 0.05 MG/ML IJ SOLN
INTRAMUSCULAR | Status: AC
  Filled 2013-05-28: qty 4

## 2013-05-28 MED ORDER — HEPARIN SOD (PORK) LOCK FLUSH 100 UNIT/ML IV SOLN
500.0000 [IU] | INTRAVENOUS | Status: DC | PRN
  Administered 2013-05-28: 500 [IU]

## 2013-05-28 MED ORDER — DIPHENHYDRAMINE HCL 50 MG/ML IJ SOLN
INTRAMUSCULAR | Status: AC
  Filled 2013-05-28: qty 1

## 2013-05-28 MED ORDER — HEPARIN SOD (PORK) LOCK FLUSH 100 UNIT/ML IV SOLN: 500.0000 [IU] | INTRAVENOUS | Status: DC

## 2013-05-28 MED ORDER — SODIUM CHLORIDE 0.9 % IV SOLN: INTRAVENOUS | Status: DC

## 2013-05-28 MED ORDER — MIDAZOLAM HCL 10 MG/2ML IJ SOLN
INTRAMUSCULAR | Status: AC
  Filled 2013-05-28: qty 4

## 2013-05-28 MED ORDER — FENTANYL CITRATE 0.05 MG/ML IJ SOLN
INTRAMUSCULAR | Status: DC | PRN
  Administered 2013-05-28 (×3): 25 ug via INTRAVENOUS

## 2013-05-28 MED ORDER — ASPIRIN 81 MG PO TBEC: 81.0000 mg | DELAYED_RELEASE_TABLET | Freq: Every day | ORAL | Status: DC

## 2013-05-28 MED ORDER — MIDAZOLAM HCL 5 MG/5ML IJ SOLN
INTRAMUSCULAR | Status: DC | PRN
  Administered 2013-05-28: 2 mg via INTRAVENOUS
  Administered 2013-05-28: 1 mg via INTRAVENOUS
  Administered 2013-05-28: 2 mg via INTRAVENOUS

## 2013-05-28 MED ORDER — SODIUM CHLORIDE 0.9 % IJ SOLN
10.0000 mL | INTRAMUSCULAR | Status: DC | PRN
  Administered 2013-05-28: 10 mL

## 2013-05-28 NOTE — Op Note (Signed)
[facility] [address] [place], [address]   COLONOSCOPY PROCEDURE REPORT  PATIENT: [name] [name], [name] [name]  MR#: [id number] BIRTHDATE: [birth year] , 67  yrs. old GENDER: Male ENDOSCOPIST: Iva Boop, MD, [facility] PROCEDURE DATE:  05/28/2013 PROCEDURE:   Colonoscopy with snare polypectomy First Screening Colonoscopy - Avg.  risk and is 50 yrs.  old or older Yes.  Prior Negative Screening - Now for repeat screening. N/A  History of Adenoma - Now for follow-up colonoscopy & has been > or = to 3 yrs.  N/A  Polyps Removed Today? Yes. ASA CLASS:   Class III INDICATIONS:average risk screening and first colonoscopy. MEDICATIONS: Fentanyl 75 mcg IV and Versed 5 mg IV  DESCRIPTION OF PROCEDURE:   After the risks benefits and alternatives of the procedure were thoroughly explained, informed consent was obtained.  A digital rectal exam revealed no abnormalities of the rectum, A digital rectal exam revealed no prostatic nodules, and A digital rectal exam revealed the prostate was not enlarged.   The Pentax Colonoscope [id number]  endoscope was introduced through the anus and advanced to the cecum, which was identified by both the appendix and ileocecal valve. No adverse events experienced.   The quality of the prep was Suprep good  The instrument was then slowly withdrawn as the colon was fully examined.  COLON FINDINGS: A sessile polyp measuring 7 mm in size was found in the rectum.  A polypectomy was performed using snare cautery.  The resection was complete and the polyp tissue was completely retrieved.   Severe diverticulosis was noted in the sigmoid colon. The colon mucosa was otherwise normal.  Retroflexed views revealed no abnormalities. The time to cecum=3 minutes 0 seconds. Withdrawal time=11 minutes 0 seconds.  The scope was withdrawn and the procedure completed. COMPLICATIONS: There were no complications.  ENDOSCOPIC IMPRESSION: 1.   Sessile polyp measuring 7 mm in  size was found in the rectum; polypectomy was performed using snare cautery 2.   Severe diverticulosis was noted in the sigmoid colon 3.   The colon mucosa was otherwise normal  RECOMMENDATIONS: 1.  Hold aspirin, aspirin products, and anti-inflammatory medication for 1 week. 2.  Timing of repeat colonoscopy will be determined by pathology findings.   eSigned:  Iva Boop, MD, [facility] 05/28/2013 1:02 PM   cc: The Patient and Nicki Reaper, NP

## 2013-05-28 NOTE — H&P (View-Only) (Signed)
Subjective:    Patient ID: [name] [name], male    DOB: [birth year], 67 y.o.   MRN: [id number]  HPI Patient returns, saw him earlier in the year. 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 He is completed that and is in remission at this time. He did recently have abdominal pain diagnosed as diverticulitis, though CT scan didn't show it he improved with antibiotic treatment. He feels well at this time, with stable dyspnea and overall health status. No Known Allergies Outpatient Prescriptions Prior to Visit  Medication Sig Dispense Refill  . Alum & Mag Hydroxide-Simeth (GI COCKTAIL) SUSP suspension Take 30 mLs by mouth 3 (three) times daily as needed for indigestion. Shake well.  300 mL  0  . aspirin EC 81 MG EC tablet Take 1 tablet (81 mg total) by mouth daily.      Marland Kitchen atorvastatin (LIPITOR) 20 MG tablet Take 1 tablet (20 mg total) by mouth daily at 6 PM.  30 tablet  5  . carvedilol (COREG) 12.5 MG tablet Take 1 tablet (12.5 mg total) by mouth 2 (two) times daily.  180 tablet  3  . glipiZIDE (GLUCOTROL) 10 MG tablet Take 1 tablet (10 mg total) by mouth 2 (two) times daily before a meal.  60 tablet  3  . lisinopril (PRINIVIL,ZESTRIL) 10 MG tablet Take 1 tablet (10 mg total) by mouth daily.  30 tablet  11  . metFORMIN (GLUCOPHAGE) 500 MG tablet Take 1,000 mg by mouth 2 (two) times daily with a meal.      . nicotine (NICODERM CQ - DOSED IN MG/24 HOURS) 14 mg/24hr patch Place 1 patch onto the skin daily.      . potassium chloride (K-DUR) 10 MEQ tablet Take 1 tablet (10 mEq total) by mouth daily.  30 tablet  5  . tiotropium (SPIRIVA) 18 MCG inhalation capsule Place 1 capsule (18 mcg total) into inhaler and inhale daily.  30 capsule  6  . torsemide (DEMADEX) 20 MG tablet Take 1 tablet (20 mg total) by mouth 2 (two) times daily.  60 tablet  5  . ciprofloxacin (CIPRO) 500 MG tablet Take 1 tablet (500 mg total) by mouth 2 (two) times daily.  28 tablet  0  .  metroNIDAZOLE (FLAGYL) 500 MG tablet Take 1 tablet (500 mg total) by mouth 3 (three) times daily.  42 tablet  0  . ondansetron (ZOFRAN) 8 MG tablet Take 8 mg by mouth every 8 (eight) hours as needed for nausea.      Marland Kitchen oxymetazoline (AFRIN) 0.05 % nasal spray Place 2 sprays into the nose 2 (two) times daily as needed. For nasal congestion      . traMADol (ULTRAM) 50 MG tablet Take 1 tablet (50 mg total) by mouth every 8 (eight) hours as needed for pain.  30 tablet  0  . Umeclidinium-Vilanterol (ANORO ELLIPTA) 62.5-25 MCG/INH AEPB Inhale 1 puff into the lungs daily.  30 each  5   Facility-Administered Medications Prior to Visit  Medication Dose Route Frequency Provider Last Rate Last Dose  . sodium chloride 0.9 % injection 10 mL  10 mL Intravenous PRN Benjiman Core, MD   10 mL at 04/25/13 1100   Past Medical History  Diagnosis Date  . Ischemic cardiomyopathy   . Type II diabetes mellitus   . Hypertension   . Hypercholesterolemia   . Heart murmur   . Shortness of breath     "all the  time" (06/06/2012)  . Lower GI bleeding 1980's    "when I was drinking alot" (06/06/2012)  . Arthritis     "both shoulders" (06/06/2012)  . Chronic combined systolic and diastolic CHF (congestive heart failure) 07/10/2012  . GERD (gastroesophageal reflux disease) 07/12/2012  . OSA on CPAP     uses a cpap  . Myocardial infarction 11/13  . Coronary artery disease   . Lymphoma   . History of alcoholism   . Sleep apnea   . Aortic stenosis   . Pancreatitis   . Diverticulosis   . Pneumonia    Past Surgical History  Procedure Laterality Date  . Coronary angioplasty with stent placement  06/06/2012    "1"  . Lymph gland excision  07/13/2012    Procedure: CERVICAL LYMPH GLAND EXCISION;  Surgeon: Currie Paris, MD;  Location: [facility] OR;  Service: General;  Laterality: Right;  . Portacath placement  07/30/2012    Procedure: INSERTION PORT-A-CATH;  Surgeon: Currie Paris, MD;  Location: [facility];  Service: General;  Laterality: Right;   History   Social History  . Marital Status: Single    [name]    Number of Children: 2  . Years of Education: 12   Occupational History  . retired    Social History Main Topics  . Smoking status: Former Smoker -- 1.00 packs/day for 53 years    Types: Cigarettes    Quit date: 07/19/2011  . Smokeless tobacco: Never Used     Comment: pt states he does not smoke as long as he uses patch  . Alcohol Use: No     Comment: 06/06/2012 "haven't had a drink in 22 years; I'm an alcoholic"  . 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 Osteoarthritis Mother   . Osteoarthritis Father   . Colon cancer Daughter   . Diabetes Father     entire family  . Heart disease Father    Review of Systems As per history of present illness    Objective:   Physical Exam General:  NAD Lungs:  clear Heart:  S1S2 3/6 harsh sem RUSB Abdomen:  soft and nontender, BS+  Data Reviewed:  CT scan Oct PCP notes Oncology notes    Assessment & Plan:   1. Special screening for malignant neoplasms, colon   2. Abdominal aortic atherosclerosis   3. On home oxygen therapy   4. Diverticulitis of colon (without mention of hemorrhage)

## 2013-05-28 NOTE — Interval H&P Note (Signed)
History and Physical Interval Note:  05/28/2013 11:35 AM  [name] [name]  has presented today for surgery, with the diagnosis of Screening [V82.9]  The various methods of treatment have been discussed with the patient and family. After consideration of risks, benefits and other options for treatment, the patient has consented to  Procedure(s): COLONOSCOPY (N/A) as a surgical intervention .  The patient's history has been reviewed, patient examined, no change in status, stable for surgery.  I have reviewed the patient's chart and labs.  Questions were answered to the patient's satisfaction.     Iva Boop, MD, [facility]

## 2013-05-29 ENCOUNTER — Telehealth: Payer: Self-pay | Admitting: Internal Medicine

## 2013-05-29 ENCOUNTER — Encounter (HOSPITAL_COMMUNITY): Payer: Self-pay | Admitting: Internal Medicine

## 2013-05-29 NOTE — Telephone Encounter (Signed)
I have explained to the patient that he does not need antibiotics.  Discussed diverticulosis vs diverticulitis.  He is advised to remain on a high fiber diet and call for any additional questions or concerns

## 2013-05-30 ENCOUNTER — Encounter: Payer: Self-pay | Admitting: Internal Medicine

## 2013-05-30 NOTE — Progress Notes (Signed)
Quick Note:  7 mm rectal adenoma - repeat colonoscopy 2019 if fit ______

## 2013-06-04 ENCOUNTER — Ambulatory Visit (HOSPITAL_BASED_OUTPATIENT_CLINIC_OR_DEPARTMENT_OTHER): Payer: Medicare Other | Admitting: Oncology

## 2013-06-04 ENCOUNTER — Telehealth: Payer: Self-pay | Admitting: Oncology

## 2013-06-04 ENCOUNTER — Other Ambulatory Visit (HOSPITAL_BASED_OUTPATIENT_CLINIC_OR_DEPARTMENT_OTHER): Payer: Medicare Other | Admitting: Lab

## 2013-06-04 VITALS — BP 118/66 | HR 101 | Temp 97.0°F | Resp 20 | Ht 73.0 in | Wt 267.6 lb

## 2013-06-04 DIAGNOSIS — C9110 Chronic lymphocytic leukemia of B-cell type not having achieved remission: Secondary | ICD-10-CM

## 2013-06-04 DIAGNOSIS — C8599 Non-Hodgkin lymphoma, unspecified, extranodal and solid organ sites: Secondary | ICD-10-CM

## 2013-06-04 DIAGNOSIS — C8590 Non-Hodgkin lymphoma, unspecified, unspecified site: Secondary | ICD-10-CM

## 2013-06-04 LAB — CBC WITH DIFFERENTIAL/PLATELET
BASO%: 0.6 % (ref 0.0–2.0)
Basophils Absolute: 0 10*3/uL (ref 0.0–0.1)
EOS%: 10.3 % — ABNORMAL HIGH (ref 0.0–7.0)
Eosinophils Absolute: 0.7 10*3/uL — ABNORMAL HIGH (ref 0.0–0.5)
HCT: 31.1 % — ABNORMAL LOW (ref 38.4–49.9)
HGB: 10.3 g/dL — ABNORMAL LOW (ref 13.0–17.1)
LYMPH%: 15.8 % (ref 14.0–49.0)
MCH: 29.9 pg (ref 27.2–33.4)
MCHC: 33.2 g/dL (ref 32.0–36.0)
MCV: 90 fL (ref 79.3–98.0)
MONO#: 0.6 10*3/uL (ref 0.1–0.9)
MONO%: 8.9 % (ref 0.0–14.0)
NEUT#: 4.3 10*3/uL (ref 1.5–6.5)
NEUT%: 64.4 % (ref 39.0–75.0)
Platelets: 195 10*3/uL (ref 140–400)
RBC: 3.46 10*6/uL — ABNORMAL LOW (ref 4.20–5.82)
RDW: 14.1 % (ref 11.0–14.6)
WBC: 6.7 10*3/uL (ref 4.0–10.3)
lymph#: 1.1 10*3/uL (ref 0.9–3.3)

## 2013-06-04 LAB — COMPREHENSIVE METABOLIC PANEL (CC13)
ALT: 24 U/L (ref 0–55)
AST: 15 U/L (ref 5–34)
Albumin: 3.9 g/dL (ref 3.5–5.0)
Alkaline Phosphatase: 81 U/L (ref 40–150)
Anion Gap: 11 mEq/L (ref 3–11)
BUN: 41.8 mg/dL — ABNORMAL HIGH (ref 7.0–26.0)
CO2: 22 mEq/L (ref 22–29)
Calcium: 9.7 mg/dL (ref 8.4–10.4)
Chloride: 103 mEq/L (ref 98–109)
Creatinine: 1.9 mg/dL — ABNORMAL HIGH (ref 0.7–1.3)
Glucose: 220 mg/dl — ABNORMAL HIGH (ref 70–140)
Potassium: 5 mEq/L (ref 3.5–5.1)
Sodium: 136 mEq/L (ref 136–145)
Total Bilirubin: 0.36 mg/dL (ref 0.20–1.20)
Total Protein: 7.2 g/dL (ref 6.4–8.3)

## 2013-06-04 NOTE — Telephone Encounter (Signed)
Gave pt appt for lab,md and CY for [facility] 2015, gave pt oral contrast

## 2013-06-04 NOTE — Progress Notes (Signed)
Hematology and Oncology Follow Up Visit  [name] [name] [id number] 1945-10-11 67 y.o. 06/04/2013 3:51 PM   Principle Diagnosis: 67 year old with stage IIIA SLL diagnosed in 06/2012. He presented with abdominal pain and diffuse lymphadenopathy.   Prior Therapy:  He is S/P lymph node biopsy that was done on 07/13/2012.  He is S/P 6 cycles of B-R started in 07/2012. He completed cycle 6 on 12/2012.    Current therapy: Observation and follow up.   Interim History: Mr. [name] [name] presents today for a follow up visit. He is a nice man with the above diagnosis. He presented with bulky adenopathy above and below the diaphragm. He tolerated chemotherapy without complications. He has done well after the completion of chemotherapy. His abdominal pain has resolved now after treatment for diverticulitis.   e is able to eat and drink. he is not reporting any nausea or vomiting is not reporting any fevers or chills. No evidence to suggest relapsed disease.   Medications: I have reviewed the patient's current medications. Current Outpatient Prescriptions  Medication Sig Dispense Refill  . Alum & Mag Hydroxide-Simeth (GI COCKTAIL) SUSP suspension Take 30 mLs by mouth 3 (three) times daily as needed for indigestion. Shake well.  300 mL  0  . AMBULATORY NON FORMULARY MEDICATION Home O2 @@ 2 LMP for 8 hrs at night      . AMBULATORY NON FORMULARY MEDICATION CPAP      . aspirin 81 MG EC tablet Take 1 tablet (81 mg total) by mouth daily. STOP NOW AND RESTART ON 06/06/2013  30 tablet  12  . atorvastatin (LIPITOR) 20 MG tablet Take 1 tablet (20 mg total) by mouth daily at 6 PM.  30 tablet  5  . carvedilol (COREG) 12.5 MG tablet Take 1 tablet (12.5 mg total) by mouth 2 (two) times daily.  180 tablet  3  . glipiZIDE (GLUCOTROL) 10 MG tablet Take 1 tablet (10 mg total) by mouth 2 (two) times daily before a meal.  60 tablet  3  . lisinopril (PRINIVIL,ZESTRIL) 10 MG tablet Take 1 tablet (10 mg total) by mouth daily.  30  tablet  11  . metFORMIN (GLUCOPHAGE) 500 MG tablet Take 1,000 mg by mouth 2 (two) times daily with a meal.      . nicotine (NICODERM CQ - DOSED IN MG/24 HOURS) 14 mg/24hr patch Place 1 patch onto the skin daily.      . potassium chloride (K-DUR) 10 MEQ tablet Take 1 tablet (10 mEq total) by mouth daily.  30 tablet  5  . tiotropium (SPIRIVA) 18 MCG inhalation capsule Place 1 capsule (18 mcg total) into inhaler and inhale daily.  30 capsule  6  . torsemide (DEMADEX) 20 MG tablet Take 1 tablet (20 mg total) by mouth 2 (two) times daily.  60 tablet  5   No current facility-administered medications for this visit.   Facility-Administered Medications Ordered in Other Visits  Medication Dose Route Frequency Provider Last Rate Last Dose  . sodium chloride 0.9 % injection 10 mL  10 mL Intravenous PRN Benjiman Core, MD   10 mL at 04/25/13 1100     Allergies: No Known Allergies  Past Medical History, Surgical history, Social history, and Family History were reviewed and updated.  Review of Systems:  Remaining ROS negative.  Physical Exam: Blood pressure 118/66, pulse 101, temperature 97 F (36.1 C), temperature source Oral, resp. rate 20, height 6\' 1"  (1.854 m), weight 267 lb 9.6 oz (121.383 kg).  ECOG: 1 General appearance: alert Head: Normocephalic, without obvious abnormality, atraumatic Neck: no adenopathy, no carotid bruit, no JVD, supple, symmetrical, trachea midline and thyroid not enlarged, symmetric, no tenderness/mass/nodules Lymph nodes: Cervical, supraclavicular, and axillary nodes normal. Heart:regular rate and rhythm, S1, S2 normal, no murmur, click, rub or gallop Lung:chest clear, no wheezing, rales, normal symmetric air entry Abdomen: soft, non-tender, without masses or organomegaly EXT:no erythema, induration, or nodules No lymph nodes noted on exam today.   Lab Results: Lab Results  Component Value Date   WBC 6.7 06/04/2013   HGB 10.3* 06/04/2013   HCT 31.1*  06/04/2013   MCV 90.0 06/04/2013   PLT 195 06/04/2013     Chemistry      Component Value Date/Time   NA 135* 04/25/2013 0933   NA 135 03/20/2013 1024   K 4.9 04/25/2013 0933   K 4.1 03/20/2013 1024   CL 101 03/20/2013 1024   CL 106 12/19/2012 0832   CO2 26 04/25/2013 0933   CO2 26 03/20/2013 1024   BUN 36.9* 04/25/2013 0933   BUN 20 03/20/2013 1024   CREATININE 1.7* 04/25/2013 0933   CREATININE 1.4 03/20/2013 1024      Component Value Date/Time   CALCIUM 10.3 04/25/2013 0933   CALCIUM 9.4 03/20/2013 1024   ALKPHOS 87 04/25/2013 0933   ALKPHOS 75 02/25/2013 1005   AST 12 04/25/2013 0933   AST 14 02/25/2013 1005   ALT 23 04/25/2013 0933   ALT 15 02/25/2013 1005   BILITOT 0.46 04/25/2013 0933   BILITOT 0.4 02/25/2013 1005      Impression and Plan:  This is a 67 year old gentleman with the following issues: 1. Chronic lymphocytic leukemia/small lymphocytic lymphoma. He is S/P B-R chemotherapy. He is toleratedt well without complications. CT on 01/29/2013 showed complete response to therapy.  We will continue observation and surveillance and certainly initiate chemotherapy again upon the relapse.  I will repeat his CT scan in March of 2015.  2. Abdominal pain: This have resolved after treatment of diverticulitis.  3. Nausea prophylaxis. on Zofran without major issues.   4. Shortness of breath:  this has improved at this time.  4. Shoulder pain: Due to osteoarthritis. Improved now.   5. Follow up: In 2 months for a Port-A-Cath flush in M.D. visit in 4 months.      Tawanda Schall 11/18/20143:51 PM

## 2013-06-05 ENCOUNTER — Other Ambulatory Visit: Payer: Medicare Other

## 2013-06-06 ENCOUNTER — Other Ambulatory Visit: Payer: Self-pay | Admitting: Internal Medicine

## 2013-06-18 ENCOUNTER — Other Ambulatory Visit: Payer: Self-pay | Admitting: Internal Medicine

## 2013-06-18 ENCOUNTER — Telehealth: Payer: Self-pay | Admitting: *Deleted

## 2013-06-18 NOTE — Telephone Encounter (Signed)
Pt called requesting MEtformin refill.  Medication not previously prescribed by you.  Please advise

## 2013-06-19 ENCOUNTER — Telehealth: Payer: Self-pay | Admitting: Internal Medicine

## 2013-06-19 NOTE — Telephone Encounter (Signed)
Ok with me 

## 2013-06-19 NOTE — Telephone Encounter (Signed)
Pt saw Dr. Rennis Golden.  He no longer sees him, but needs refill on Metformin.

## 2013-06-19 NOTE — Telephone Encounter (Signed)
Pt would like to switch PCP from [facility] to Dr. Jonny Ruiz.

## 2013-06-20 NOTE — Telephone Encounter (Signed)
LMOM for pt to call back.

## 2013-06-21 ENCOUNTER — Telehealth: Payer: Self-pay

## 2013-06-21 DIAGNOSIS — I10 Essential (primary) hypertension: Secondary | ICD-10-CM

## 2013-06-21 DIAGNOSIS — C8590 Non-Hodgkin lymphoma, unspecified, unspecified site: Secondary | ICD-10-CM

## 2013-06-21 DIAGNOSIS — E119 Type 2 diabetes mellitus without complications: Secondary | ICD-10-CM

## 2013-06-21 MED ORDER — METFORMIN HCL 500 MG PO TABS: 1000.0000 mg | ORAL_TABLET | Freq: Two times a day (BID) | ORAL | Status: DC

## 2013-06-21 NOTE — Telephone Encounter (Signed)
The patient called hoping to get a refill on his metformin.

## 2013-06-21 NOTE — Telephone Encounter (Signed)
Pt is calling again for a refill on Metformin.  He no longer sees the doctor who originally wrote this RX.  He is a patient of Rene Kocher.  Please send to someone covering for her.

## 2013-06-21 NOTE — Telephone Encounter (Signed)
plz notify I've sent in 1 month supply of metformin for him.  However, given recently worsening kidney function he needs to schedule appt with Rene Kocher to discuss diabetes meds. Lab Results  Component Value Date   CREATININE 1.9* 06/04/2013

## 2013-06-21 NOTE — Telephone Encounter (Signed)
Patient advised. Appointment scheduled.  

## 2013-06-23 NOTE — Telephone Encounter (Signed)
He needs an appt to discuss metformin. It is affecting his kidneys and we need to stop it and start a new medication. He can see me or Dr. Jonny Ruiz (his new PCP)

## 2013-06-23 NOTE — Telephone Encounter (Signed)
Ok with me 

## 2013-06-24 ENCOUNTER — Ambulatory Visit: Payer: Medicare Other | Admitting: Internal Medicine

## 2013-06-24 NOTE — Telephone Encounter (Signed)
Unable to contact pt, left message to return call

## 2013-06-25 NOTE — Telephone Encounter (Signed)
Pt has an appt Wed. Dec 10 to discuss medication with Dr. Jonny Ruiz.

## 2013-06-26 ENCOUNTER — Telehealth: Payer: Self-pay | Admitting: Internal Medicine

## 2013-06-26 ENCOUNTER — Ambulatory Visit (INDEPENDENT_AMBULATORY_CARE_PROVIDER_SITE_OTHER): Payer: Medicare Other | Admitting: Internal Medicine

## 2013-06-26 ENCOUNTER — Encounter: Payer: Self-pay | Admitting: Internal Medicine

## 2013-06-26 ENCOUNTER — Other Ambulatory Visit (INDEPENDENT_AMBULATORY_CARE_PROVIDER_SITE_OTHER): Payer: Medicare Other

## 2013-06-26 ENCOUNTER — Other Ambulatory Visit: Payer: Self-pay | Admitting: Internal Medicine

## 2013-06-26 VITALS — BP 108/72 | HR 82 | Temp 97.0°F | Ht 75.0 in | Wt 274.0 lb

## 2013-06-26 DIAGNOSIS — I10 Essential (primary) hypertension: Secondary | ICD-10-CM

## 2013-06-26 DIAGNOSIS — E119 Type 2 diabetes mellitus without complications: Secondary | ICD-10-CM

## 2013-06-26 DIAGNOSIS — N289 Disorder of kidney and ureter, unspecified: Secondary | ICD-10-CM

## 2013-06-26 DIAGNOSIS — F17200 Nicotine dependence, unspecified, uncomplicated: Secondary | ICD-10-CM

## 2013-06-26 DIAGNOSIS — I509 Heart failure, unspecified: Secondary | ICD-10-CM

## 2013-06-26 DIAGNOSIS — I5042 Chronic combined systolic (congestive) and diastolic (congestive) heart failure: Secondary | ICD-10-CM

## 2013-06-26 LAB — BASIC METABOLIC PANEL
Calcium: 9.6 mg/dL (ref 8.4–10.5)
GFR: 42.14 mL/min — ABNORMAL LOW (ref >60.00)
Potassium: 5.4 mEq/L — ABNORMAL HIGH (ref 3.5–5.1)
Sodium: 138 mEq/L (ref 135–145)

## 2013-06-26 LAB — HEPATIC FUNCTION PANEL
Albumin: 4.3 g/dL (ref 3.5–5.2)
Total Protein: 7.9 g/dL (ref 6.0–8.3)

## 2013-06-26 LAB — LIPID PANEL
Cholesterol: 130 mg/dL (ref 0–200)
HDL: 38 mg/dL — ABNORMAL LOW (ref >39.00)
Total CHOL/HDL Ratio: 3
VLDL: 19.2 mg/dL (ref 0.0–40.0)

## 2013-06-26 MED ORDER — SITAGLIPTIN PHOSPHATE 100 MG PO TABS: 100.0000 mg | ORAL_TABLET | Freq: Every day | ORAL | Status: DC

## 2013-06-26 MED ORDER — VARENICLINE TARTRATE 0.5 MG X 11 & 1 MG X 42 PO MISC: ORAL | Status: DC

## 2013-06-26 MED ORDER — ATORVASTATIN CALCIUM 20 MG PO TABS: 20.0000 mg | ORAL_TABLET | Freq: Every day | ORAL | Status: DC

## 2013-06-26 MED ORDER — VARENICLINE TARTRATE 1 MG PO TABS: 1.0000 mg | ORAL_TABLET | Freq: Two times a day (BID) | ORAL | Status: DC

## 2013-06-26 NOTE — Progress Notes (Signed)
Pre-visit discussion using our clinic review tool. No additional management support is needed unless otherwise documented below in the visit note.  

## 2013-06-26 NOTE — Assessment & Plan Note (Signed)
For f/u a1c today as has now been at least 6 wks since last [facility] med change; now on max metformin and glucotrol, may need metformin for cr > 1.6; adding actos likely not first choice though less expensive given risk of chf exac; safer to add [place] and pt states he understands [place] more expensive

## 2013-06-26 NOTE — Progress Notes (Signed)
Subjective:    Patient ID: [name] [name], male    DOB: 1945/08/12, 67 y.o.   MRN: [id number]  HPI  Here as new pt to me, last seen per [facility]/NP, with glucotrol increased after found markedly elev a1c > 11;  Recalls cbg > 500 freq prior, possibly for quite some time.  Also on metformin with new increasing BUN/Cr for unclear reasons just in last few months, though has had ACE increased recent per cardiology in Oct 2014.  Also on torsemide, no orthostatic symptoms.  Pt denies chest pain, increased sob or doe, wheezing, orthopnea, PND, increased LE swelling, palpitations, dizziness or syncope.  Wants to try quitting smoking, successful once with chantix, asks for repeat try.  .Pt denies new neurological symptoms such as new headache, or facial or extremity weakness or numbness   Pt denies polydipsia, polyuria, plans to do better with diet, and seems sincere in wanting to "turn my health around." Past Medical History  Diagnosis Date  . Ischemic cardiomyopathy   . Type II diabetes mellitus   . Hypertension   . Hypercholesterolemia   . Heart murmur   . Shortness of breath     "all the time" (06/06/2012)  . Lower GI bleeding 1980's    "when I was drinking alot" (06/06/2012)  . Arthritis     "both shoulders" (06/06/2012)  . Chronic combined systolic and diastolic CHF (congestive heart failure) 07/10/2012  . GERD (gastroesophageal reflux disease) 07/12/2012  . OSA on CPAP     uses a cpap  . Myocardial infarction 11/13  . Coronary artery disease   . Lymphoma   . History of alcoholism   . Sleep apnea   . Aortic stenosis   . Pancreatitis   . Diverticulosis   . Pneumonia   . Personal history of rectal adenoma 05/28/2013   Past Surgical History  Procedure Laterality Date  . Coronary angioplasty with stent placement  06/06/2012    "1"  . Lymph gland excision  07/13/2012    Procedure: CERVICAL LYMPH GLAND EXCISION;  Surgeon: Currie Paris, MD;  Location: [facility] OR;  Service:  General;  Laterality: Right;  . Portacath placement  07/30/2012    Procedure: INSERTION PORT-A-CATH;  Surgeon: Currie Paris, MD;  Location: [facility];  Service: General;  Laterality: Right;  . Colonoscopy N/A 05/28/2013    Procedure: COLONOSCOPY;  Surgeon: Iva Boop, MD;  Location: [facility];  Service: Endoscopy;  Laterality: N/A;    reports that he quit smoking about 23 months ago. His smoking use included Cigarettes. He has a 53 pack-year smoking history. He has never used smokeless tobacco. He reports that he does not drink alcohol or use illicit drugs. family history includes Colon cancer in his daughter; Diabetes in his father; Heart disease in his father; Osteoarthritis in his father and mother. No Known Allergies Current Outpatient Prescriptions on File Prior to Visit  Medication Sig Dispense Refill  . Alum & Mag Hydroxide-Simeth (GI COCKTAIL) SUSP suspension Take 30 mLs by mouth 3 (three) times daily as needed for indigestion. Shake well.  300 mL  0  . AMBULATORY NON FORMULARY MEDICATION Home O2 @@ 2 LMP for 8 hrs at night      . AMBULATORY NON FORMULARY MEDICATION CPAP      . aspirin 81 MG EC tablet Take 1 tablet (81 mg total) by mouth daily. STOP NOW AND RESTART ON 06/06/2013  30 tablet  12  . carvedilol (COREG) 12.5 MG tablet  Take 1 tablet (12.5 mg total) by mouth 2 (two) times daily.  180 tablet  3  . glipiZIDE (GLUCOTROL) 10 MG tablet Take 1 tablet (10 mg total) by mouth 2 (two) times daily before a meal.  60 tablet  3  . lisinopril (PRINIVIL,ZESTRIL) 10 MG tablet Take 1 tablet (10 mg total) by mouth daily.  30 tablet  11  . metFORMIN (GLUCOPHAGE) 500 MG tablet Take 2 tablets (1,000 mg total) by mouth 2 (two) times daily with a meal.  120 tablet  0  . nicotine (NICODERM CQ - DOSED IN MG/24 HOURS) 14 mg/24hr patch Place 1 patch onto the skin daily.      . potassium chloride (K-DUR) 10 MEQ tablet Take 1 tablet (10 mEq total) by mouth daily.  30 tablet  5    . tiotropium (SPIRIVA) 18 MCG inhalation capsule Place 1 capsule (18 mcg total) into inhaler and inhale daily.  30 capsule  6  . torsemide (DEMADEX) 20 MG tablet Take 1 tablet (20 mg total) by mouth 2 (two) times daily.  60 tablet  5   Current Facility-Administered Medications on File Prior to Visit  Medication Dose Route Frequency Provider Last Rate Last Dose  . sodium chloride 0.9 % injection 10 mL  10 mL Intravenous PRN Benjiman Core, MD   10 mL at 04/25/13 1100   Review of Systems  Constitutional: Negative for unexpected weight change, or unusual diaphoresis  HENT: Negative for tinnitus.   Eyes: Negative for photophobia and visual disturbance.  Respiratory: Negative for choking and stridor.   Gastrointestinal: Negative for vomiting and blood in stool.  Genitourinary: Negative for hematuria and decreased urine volume.  Musculoskeletal: Negative for acute joint swelling Skin: Negative for color change and wound.  Neurological: Negative for tremors and numbness other than noted  Psychiatric/Behavioral: Negative for decreased concentration or  hyperactivity.       Objective:   Physical Exam BP 108/72  Pulse 82  Temp(Src) 97 F (36.1 C) (Oral)  Ht 6\' 3"  (1.905 m)  Wt 274 lb (124.286 kg)  BMI 34.25 kg/m2  SpO2 97% VS noted, not ill appearing Constitutional: Pt appears well-developed and well-nourished. Lavella Lemons HENT: Head: NCAT.  Right Ear: External ear normal.  Left Ear: External ear normal.  Eyes: Conjunctivae and EOM are normal. Pupils are equal, round, and reactive to light.  Neck: Normal range of motion. Neck supple.  Cardiovascular: Normal rate and regular rhythm.   Pulmonary/Chest: Effort normal and breath sounds normal.  Abd:  Soft, NT, non-distended, + BS Neurological: Pt is alert. Not confused  Skin: Skin is warm. No erythema.  Psychiatric: Pt behavior is normal. Thought content normal.     Assessment & Plan:

## 2013-06-26 NOTE — Assessment & Plan Note (Signed)
Pt reqeusts referral back to Dr Elease Hashimoto given recent renal and med issues sooner than currently planned

## 2013-06-26 NOTE — Assessment & Plan Note (Addendum)
?   Related to recent increased ACE vs other - pt adamant he does not want ACE changed except per Dr Elease Hashimoto, will defer to pt for now, re-check labs today, explained to pt sometimes even 10% worsening of renal fxn is tolerated if felt benefit of ACE is more then risk;  Consider renal u/s as even medical renal dz cant be ruled out such as dm/htn nephropathy though has only just recently worsened; consider renal consult  Note:  Total time for pt hx, exam, review of record with pt in the room, determination of diagnoses and plan for further eval and tx is > 40 min, with over 50% spent in coordination and counseling of patient

## 2013-06-26 NOTE — Telephone Encounter (Signed)
Robin to see lab result note on adding [place]  ALSO:  Needs to d/c metformin due to elevated Creatinine  Also; with elev creatinine - for renal u/s

## 2013-06-26 NOTE — Patient Instructions (Signed)
Please continue all other medications as before, for now, although we may have to stop the metformin if the kidney function is not improved Your atorvastatin was refilled today, as well as the chantix Please have the pharmacy call with any other refills you may need. You will be contacted regarding the referral for: Dr Elease Hashimoto per your request  Please go to the LAB in the Basement (turn left off the elevator) for the tests to be done today You will be contacted by phone if any changes need to be made immediately.  Otherwise, you will receive a letter about your results with an explanation, but please check with MyChart first.  Please remember to sign up for My Chart if you have not done so, as this will be important to you in the future with finding out test results, communicating by private email, and scheduling acute appointments online when needed.  Please return in 6 weeks, or sooner if needed

## 2013-06-26 NOTE — Assessment & Plan Note (Signed)
Ok for chantix  

## 2013-06-26 NOTE — Assessment & Plan Note (Signed)
stable overall by history and exam, recent data reviewed with pt, and pt to continue medical treatment as before,  to f/u any worsening symptoms or concerns BP Readings from Last 3 Encounters:  06/26/13 108/72  06/04/13 118/66  05/28/13 121/72

## 2013-06-28 ENCOUNTER — Ambulatory Visit
Admission: RE | Admit: 2013-06-28 | Discharge: 2013-06-28 | Disposition: A | Discharge status: Home / Self Care | Payer: Medicare Other | Source: Ambulatory Visit | Attending: Internal Medicine | Admitting: Internal Medicine

## 2013-06-28 ENCOUNTER — Telehealth: Payer: Self-pay | Admitting: Internal Medicine

## 2013-06-28 DIAGNOSIS — N289 Disorder of kidney and ureter, unspecified: Secondary | ICD-10-CM

## 2013-06-28 NOTE — Telephone Encounter (Signed)
Message copied by Corwin Levins on Fri Jun 28, 2013 10:39 AM ------      Message from: Scharlene Gloss B      Created: Fri Jun 28, 2013 10:29 AM       Please change abdominal U/S to Renal U/S ------

## 2013-06-28 NOTE — Telephone Encounter (Signed)
Order redone

## 2013-07-09 ENCOUNTER — Other Ambulatory Visit: Payer: Self-pay | Admitting: Cardiovascular Disease

## 2013-07-25 ENCOUNTER — Encounter: Payer: Self-pay | Admitting: Cardiovascular Disease

## 2013-07-25 ENCOUNTER — Ambulatory Visit (INDEPENDENT_AMBULATORY_CARE_PROVIDER_SITE_OTHER): Payer: Medicare HMO | Admitting: Cardiovascular Disease

## 2013-07-25 VITALS — BP 111/68 | HR 84 | Ht 75.0 in | Wt 277.0 lb

## 2013-07-25 DIAGNOSIS — I5022 Chronic systolic (congestive) heart failure: Secondary | ICD-10-CM

## 2013-07-25 DIAGNOSIS — I509 Heart failure, unspecified: Secondary | ICD-10-CM

## 2013-07-25 DIAGNOSIS — I2510 Atherosclerotic heart disease of native coronary artery without angina pectoris: Secondary | ICD-10-CM

## 2013-07-25 DIAGNOSIS — E785 Hyperlipidemia, unspecified: Secondary | ICD-10-CM

## 2013-07-25 MED ORDER — ATORVASTATIN CALCIUM 20 MG PO TABS: 20.0000 mg | ORAL_TABLET | Freq: Every day | ORAL | Status: DC

## 2013-07-25 NOTE — Patient Instructions (Addendum)
Your physician recommends that you schedule a follow-up appointment in: [place] physician recommends that you return for a FASTING lipid profile: 3 MONTHS

## 2013-07-25 NOTE — Assessment & Plan Note (Addendum)
Raney presents today for followup of his chronic systolic congestive heart failure.  He has gained some weight since his last office visit he thinks that actual weight gain because of his overeating and has not excess fluid. On exam he does not appear to be volume overloaded. He still takes all of his medications reliably and is on Demadex twice a day.  We talked about cutting out the sweet  Drinks.  His he has been eating lots of lobster with butter in the past several weeks. Apparently [employer] had a sale on Lobster tails.    He is to start exercising on a regular basis.  Will continue the same medications. I'll send him 3 months for followup visit. We'll check labs at that time including a basic medical profile, CBC, lipids, and hepatic profile.Marland Kitchen

## 2013-07-25 NOTE — Progress Notes (Signed)
[name] [name] Date of Birth  [birth year]       [facility] Office [address], [place], [place]  [address]   [place], [place]  [address] [phone]     [phone]   Fax  [phone]    Fax [phone]  Problem List: 1. Coronary artery disease 2. Ischemic cardiopathy with an ejection fraction of around 35% 3. Aortic stenosis - moderate 4.  Non hodgekins lymphoma 5. Type 2 diabetes mellitus 6.  Sleep apnea  History of Present Illness:  [name] [name] is a 68 yo with hx of CAD, CHF.  He has been seen at [facility] for the past year.  He has had progressive dyspnea for the past 4 months.   He has had increased swelling in his legs and ankles.  He has not been able to get back in with [facility] and wanted to come here for further evaluation.    He had 2 stents placed in Oct. 2013.  He did well for 6 months.  Then his condition started to decline - porgressive , severe DOE with any exertion.  Echo recently showed. Left ventricle: The cavity size was mildly dilated. Wall thickness was normal. Systolic function was mildly to  moderately reduced. The estimated ejection fraction was in the range of 40% to 45%. There is akinesis of the posterior and distal lateral myocardium; inferior hypokinesis. Doppler parameters are consistent with abnormal left ventricular relaxation (grade 1 diastolic dysfunction). - Mitral valve: Mild regurgitation. - Left atrium: The atrium was mildly dilated. PA pressures of 62.  January 23, 2013: [name] [name] is doing much better.  We started him on torsemide and he has diuresed quite nicely.  He has lost 17 pounds in the past 2 weeks.  He is still dyspneic with exertion.  He is trying to avoid salt but is having some challenges. No CP.   No PND.  No significant edema.  February 25, 2013: [name] [name] is dong well.  Breathing is better.  He is having some foot / toe pain.  Needs to keep his toenails  trimmed better.   Oct. 22, 2014: [name] [name] is doing well.  Recently diagnosed with diverticulosis.    Jan. 8, 2015: [name] [name] has gained weight since I saw him. His weight is up 14 pounds since his last office visit in October.  He has has been eating lots and lots of food since Christmas.  He has not bee eating much salt.   He is feeling disgusted with himself.  He knows that he had been eating too much and has not been exercising.  He want to [facility] for Christmas vacation.  He was not able to climb the 3 flights of stairs at his sister's house.  He started smoking cigarettes again for a month or so. He now has stopped again.    Current Outpatient Prescriptions on File Prior to Visit  Medication Sig Dispense Refill  . Alum & Mag Hydroxide-Simeth (GI COCKTAIL) SUSP suspension Take 30 mLs by mouth 3 (three) times daily as needed for indigestion. Shake well.  300 mL  0  . AMBULATORY NON FORMULARY MEDICATION Home O2 @@ 2 LMP for 8 hrs at night      . AMBULATORY NON FORMULARY MEDICATION CPAP      . aspirin 81 MG EC tablet Take 1 tablet (81 mg total) by mouth daily. STOP NOW AND RESTART ON 06/06/2013  30 tablet  12  . atorvastatin (LIPITOR) 20 MG tablet Take 1 tablet (20 mg total) by mouth daily at 6 PM.  90 tablet  3  . carvedilol (COREG) 12.5 MG tablet Take 1 tablet (12.5 mg total) by mouth 2 (two) times daily.  180 tablet  3  . glipiZIDE (GLUCOTROL) 10 MG tablet Take 1 tablet (10 mg total) by mouth 2 (two) times daily before a meal.  60 tablet  3  . lisinopril (PRINIVIL,ZESTRIL) 10 MG tablet Take 1 tablet (10 mg total) by mouth daily.  30 tablet  11  . metFORMIN (GLUCOPHAGE) 500 MG tablet Take 2 tablets (1,000 mg total) by mouth 2 (two) times daily with a meal.  120 tablet  0  . nicotine (NICODERM CQ - DOSED IN MG/24 HOURS) 14 mg/24hr patch Place 1 patch onto the skin daily.      . potassium chloride (K-DUR) 10 MEQ tablet Take 1 tablet (10 mEq total) by mouth daily.  30 tablet  5  .  sitaGLIPtin (JANUVIA) 100 MG tablet Take 1 tablet (100 mg total) by mouth daily.  30 tablet  11  . tiotropium (SPIRIVA) 18 MCG inhalation capsule Place 1 capsule (18 mcg total) into inhaler and inhale daily.  30 capsule  6  . torsemide (DEMADEX) 20 MG tablet TAKE ONE TABLET BY MOUTH TWICE DAILY  60 tablet  0  . varenicline (CHANTIX CONTINUING MONTH PAK) 1 MG tablet Take 1 tablet (1 mg total) by mouth 2 (two) times daily.  60 tablet  1  . varenicline (CHANTIX STARTING MONTH PAK) 0.5 MG X 11 & 1 MG X 42 tablet Take one 0.5 mg tablet by mouth once daily for 3 days, then increase to one 0.5 mg tablet twice daily for 4 days, then increase to one 1 mg tablet twice daily.  53 tablet  0   Current Facility-Administered Medications on File Prior to Visit  Medication Dose Route Frequency Provider Last Rate Last Dose  . sodium chloride 0.9 % injection 10 mL  10 mL Intravenous PRN [name], MD   10 mL at 04/25/13 1100    No Known Allergies  Past Medical History  Diagnosis Date  . Ischemic cardiomyopathy   . Type II diabetes mellitus   . Hypertension   . Hypercholesterolemia   . Heart murmur   . Shortness of breath     "all the time" (06/06/2012)  . Lower GI bleeding 1980's    "when I was drinking alot" (06/06/2012)  . Arthritis     "both shoulders" (06/06/2012)  . Chronic combined systolic and diastolic CHF (congestive heart failure) 07/10/2012  . GERD (gastroesophageal reflux disease) 07/12/2012  . OSA on CPAP     uses a cpap  . Myocardial infarction 11/13  . Coronary artery disease   . Lymphoma   . History of alcoholism   . Sleep apnea   . Aortic stenosis   . Pancreatitis   . Diverticulosis   . Pneumonia   . Personal history of rectal adenoma 05/28/2013    Past Surgical History  Procedure Laterality Date  . Coronary angioplasty with stent placement  06/06/2012    "1"  . Lymph gland excision  07/13/2012    Procedure: CERVICAL LYMPH GLAND EXCISION;  Surgeon: Haywood Lasso, MD;  Location: [place];  Service: General;  Laterality: Right;  . Portacath placement  07/30/2012    Procedure: INSERTION PORT-A-CATH;  Surgeon: Haywood Lasso, MD;  Location: [facility];  Service: General;  Laterality: Right;  . Colonoscopy N/A 05/28/2013    Procedure: COLONOSCOPY;  Surgeon: Gatha Mayer, MD;  Location: [facility];  Service: Endoscopy;  Laterality: N/A;    History  Smoking status  . Former Smoker -- 1.00 packs/day for 53 years  . Types: Cigarettes  . Quit date: 07/19/2011  Smokeless tobacco  . Never Used    Comment: pt states he does not smoke as long as he uses patch    History  Alcohol Use No    Comment: 06/06/2012 "haven't had a drink in 22 years; I'm an alcoholic"    Family History  Problem Relation Age of Onset  . Osteoarthritis Mother   . Osteoarthritis Father   . Colon cancer Daughter   . Diabetes Father     entire family  . Heart disease Father     Reviw of Systems:  Reviewed in the HPI.  All other systems are negative.  Physical Exam: Blood pressure 111/68, pulse 84, height 6\' 3"  (1.905 m), weight 277 lb (125.646 kg). General: Well developed, well nourished, in no acute distress.    Head: Normocephalic, atraumatic, sclera non-icteric, mucus membranes are moist,   Neck: Supple. Carotids are 2 + without bruits. JVD 10  cm  Lungs: Clear   Heart: RR, 7-4/1 systolic murmur that decreases with inspiration.  Abdomen: Soft, non-tender, slightly edematous  Msk:  Strength and tone are normal   Extremities: No clubbing or cyanosis. No  edema,     Neuro: CN II - XII intact.  Alert and oriented X 3.   Psych:  Normal   ECG:  Jan. 8, 2015:  NSR at 84. TWI in the lateral leads.   Assessment / Plan:

## 2013-08-01 ENCOUNTER — Ambulatory Visit: Payer: Medicare Other | Admitting: Cardiovascular Disease

## 2013-08-05 ENCOUNTER — Ambulatory Visit (HOSPITAL_BASED_OUTPATIENT_CLINIC_OR_DEPARTMENT_OTHER): Payer: Medicare HMO

## 2013-08-05 VITALS — BP 138/69 | HR 83 | Temp 97.6°F | Resp 20

## 2013-08-05 DIAGNOSIS — C259 Malignant neoplasm of pancreas, unspecified: Secondary | ICD-10-CM

## 2013-08-05 DIAGNOSIS — C8590 Non-Hodgkin lymphoma, unspecified, unspecified site: Secondary | ICD-10-CM

## 2013-08-05 DIAGNOSIS — C8599 Non-Hodgkin lymphoma, unspecified, extranodal and solid organ sites: Secondary | ICD-10-CM

## 2013-08-05 DIAGNOSIS — C9110 Chronic lymphocytic leukemia of B-cell type not having achieved remission: Secondary | ICD-10-CM

## 2013-08-05 DIAGNOSIS — Z452 Encounter for adjustment and management of vascular access device: Secondary | ICD-10-CM

## 2013-08-05 MED ORDER — SODIUM CHLORIDE 0.9 % IJ SOLN
10.0000 mL | Freq: Once | INTRAMUSCULAR | Status: AC
  Administered 2013-08-05: 10 mL
  Filled 2013-08-05: qty 10

## 2013-08-05 MED ORDER — HEPARIN SOD (PORK) LOCK FLUSH 100 UNIT/ML IV SOLN
500.0000 [IU] | Freq: Once | INTRAVENOUS | Status: AC
  Administered 2013-08-05: 500 [IU]
  Filled 2013-08-05: qty 5

## 2013-08-06 ENCOUNTER — Other Ambulatory Visit (INDEPENDENT_AMBULATORY_CARE_PROVIDER_SITE_OTHER): Payer: Medicare HMO

## 2013-08-06 ENCOUNTER — Encounter: Payer: Self-pay | Admitting: Internal Medicine

## 2013-08-06 ENCOUNTER — Ambulatory Visit (INDEPENDENT_AMBULATORY_CARE_PROVIDER_SITE_OTHER): Payer: Medicare HMO | Admitting: Internal Medicine

## 2013-08-06 VITALS — BP 132/70 | HR 72 | Temp 98.3°F | Ht 75.0 in | Wt 283.0 lb

## 2013-08-06 DIAGNOSIS — I2510 Atherosclerotic heart disease of native coronary artery without angina pectoris: Secondary | ICD-10-CM

## 2013-08-06 DIAGNOSIS — E785 Hyperlipidemia, unspecified: Secondary | ICD-10-CM

## 2013-08-06 DIAGNOSIS — I10 Essential (primary) hypertension: Secondary | ICD-10-CM

## 2013-08-06 DIAGNOSIS — N289 Disorder of kidney and ureter, unspecified: Secondary | ICD-10-CM

## 2013-08-06 DIAGNOSIS — N183 Chronic kidney disease, stage 3 unspecified: Secondary | ICD-10-CM | POA: Insufficient documentation

## 2013-08-06 DIAGNOSIS — I5022 Chronic systolic (congestive) heart failure: Secondary | ICD-10-CM

## 2013-08-06 DIAGNOSIS — E119 Type 2 diabetes mellitus without complications: Secondary | ICD-10-CM

## 2013-08-06 LAB — BASIC METABOLIC PANEL
BUN: 29 mg/dL — ABNORMAL HIGH (ref 6–23)
CHLORIDE: 101 meq/L (ref 96–112)
CO2: 25 meq/L (ref 19–32)
Calcium: 9.1 mg/dL (ref 8.4–10.5)
Creatinine, Ser: 1.5 mg/dL (ref 0.4–1.5)
GFR: 59.15 mL/min — ABNORMAL LOW (ref >60.00)
Glucose, Bld: 249 mg/dL — ABNORMAL HIGH (ref 70–99)
Potassium: 4.4 mEq/L (ref 3.5–5.1)
SODIUM: 136 meq/L (ref 135–145)

## 2013-08-06 NOTE — Progress Notes (Signed)
Pre-visit discussion using our clinic review tool. No additional management support is needed unless otherwise documented below in the visit note.  

## 2013-08-06 NOTE — Assessment & Plan Note (Signed)
Improved a1c as of late dec, but metformin now stopped due to worsening renal fxn, januvia increasd, cont to monitor cbgs, declines dm education today

## 2013-08-06 NOTE — Assessment & Plan Note (Signed)
Stable, will cont ACEI for now

## 2013-08-06 NOTE — Progress Notes (Signed)
Subjective:    Patient ID: [name] [name], male    DOB: 02/28/1946, 68 y.o.   MRN: [id number]  HPI  Here to f/u, did stop the metformin, and increased the [place], tolerating ok, here for re-check renal.   Pt denies chest pain, increased sob or doe, wheezing, orthopnea, PND, increased LE swelling, palpitations, dizziness or syncope.  Pt denies polydipsia, polyuria, or low sugar symptoms such as weakness or confusion improved with po intake.  Pt denies new neurological symptoms such as new headache, or facial or extremity weakness or numbness.   Pt states overall good compliance with meds, has been trying to follow lower cholesterol DM diet, with wt overall stable,  but little exercise however.  Had gained over 20 lbs per pt over the holidays.  Lab review indicates steadily worsening Cr x 4 mo, renal u/s neg. Past Medical History  Diagnosis Date  . Ischemic cardiomyopathy   . Type II diabetes mellitus   . Hypertension   . Hypercholesterolemia   . Heart murmur   . Shortness of breath     "all the time" (06/06/2012)  . Lower GI bleeding 1980's    "when I was drinking alot" (06/06/2012)  . Arthritis     "both shoulders" (06/06/2012)  . Chronic combined systolic and diastolic CHF (congestive heart failure) 07/10/2012  . GERD (gastroesophageal reflux disease) 07/12/2012  . OSA on CPAP     uses a cpap  . Myocardial infarction 11/13  . Coronary artery disease   . Lymphoma   . History of alcoholism   . Sleep apnea   . Aortic stenosis   . Pancreatitis   . Diverticulosis   . Pneumonia   . Personal history of rectal adenoma 05/28/2013   Past Surgical History  Procedure Laterality Date  . Coronary angioplasty with stent placement  06/06/2012    "1"  . Lymph gland excision  07/13/2012    Procedure: CERVICAL LYMPH GLAND EXCISION;  Surgeon: Haywood Lasso, MD;  Location: [place];  Service: General;  Laterality: Right;  . Portacath placement  07/30/2012    Procedure: INSERTION PORT-A-CATH;   Surgeon: Haywood Lasso, MD;  Location: [place];  Service: General;  Laterality: Right;  . Colonoscopy N/A 05/28/2013    Procedure: COLONOSCOPY;  Surgeon: Gatha Mayer, MD;  Location: [facility];  Service: Endoscopy;  Laterality: N/A;    reports that he quit smoking about 2 years ago. His smoking use included Cigarettes. He has a 53 pack-year smoking history. He has never used smokeless tobacco. He reports that he does not drink alcohol or use illicit drugs. family history includes Colon cancer in his daughter; Diabetes in his father; Heart disease in his father; Osteoarthritis in his father and mother. No Known Allergies Current Outpatient Prescriptions on File Prior to Visit  Medication Sig Dispense Refill  . Alum & Mag Hydroxide-Simeth (GI COCKTAIL) SUSP suspension Take 30 mLs by mouth 3 (three) times daily as needed for indigestion. Shake well.  300 mL  0  . AMBULATORY NON FORMULARY MEDICATION Home O2 @@ 2 LMP for 8 hrs at night      . AMBULATORY NON FORMULARY MEDICATION CPAP      . aspirin 81 MG EC tablet Take 1 tablet (81 mg total) by mouth daily. STOP NOW AND RESTART ON 06/06/2013  30 tablet  12  . atorvastatin (LIPITOR) 20 MG tablet Take 1 tablet (20 mg total) by mouth daily at 6 PM.  90 tablet  3  . carvedilol (COREG) 12.5 MG tablet Take 1 tablet (12.5 mg total) by mouth 2 (two) times daily.  180 tablet  3  . glipiZIDE (GLUCOTROL) 10 MG tablet Take 1 tablet (10 mg total) by mouth 2 (two) times daily before a meal.  60 tablet  3  . lisinopril (PRINIVIL,ZESTRIL) 10 MG tablet Take 1 tablet (10 mg total) by mouth daily.  30 tablet  11  . metFORMIN (GLUCOPHAGE) 500 MG tablet Take 2 tablets (1,000 mg total) by mouth 2 (two) times daily with a meal.  120 tablet  0  . nicotine (NICODERM CQ - DOSED IN MG/24 HOURS) 14 mg/24hr patch Place 1 patch onto the skin daily.      . potassium chloride (K-DUR) 10 MEQ tablet Take 1 tablet (10 mEq total) by mouth daily.  30 tablet  5    . sitaGLIPtin (JANUVIA) 100 MG tablet Take 1 tablet (100 mg total) by mouth daily.  30 tablet  11  . tiotropium (SPIRIVA) 18 MCG inhalation capsule Place 1 capsule (18 mcg total) into inhaler and inhale daily.  30 capsule  6  . torsemide (DEMADEX) 20 MG tablet TAKE ONE TABLET BY MOUTH TWICE DAILY  60 tablet  0  . varenicline (CHANTIX CONTINUING MONTH PAK) 1 MG tablet Take 1 tablet (1 mg total) by mouth 2 (two) times daily.  60 tablet  1  . varenicline (CHANTIX STARTING MONTH PAK) 0.5 MG X 11 & 1 MG X 42 tablet Take one 0.5 mg tablet by mouth once daily for 3 days, then increase to one 0.5 mg tablet twice daily for 4 days, then increase to one 1 mg tablet twice daily.  53 tablet  0   Current Facility-Administered Medications on File Prior to Visit  Medication Dose Route Frequency Provider Last Rate Last Dose  . sodium chloride 0.9 % injection 10 mL  10 mL Intravenous PRN [name], MD   10 mL at 04/25/13 1100   Review of Systems  Constitutional: Negative for unexpected weight change, or unusual diaphoresis  HENT: Negative for tinnitus.   Eyes: Negative for photophobia and visual disturbance.  Respiratory: Negative for choking and stridor.   Gastrointestinal: Negative for vomiting and blood in stool.  Genitourinary: Negative for hematuria and decreased urine volume.  Musculoskeletal: Negative for acute joint swelling Skin: Negative for color change and wound.  Neurological: Negative for tremors and numbness other than noted  Psychiatric/Behavioral: Negative for decreased concentration or  hyperactivity.       Objective:   Physical Exam BP 132/70  Pulse 72  Temp(Src) 98.3 F (36.8 C) (Oral)  Ht 6\' 3"  (1.905 m)  Wt 283 lb (128.368 kg)  BMI 35.37 kg/m2  SpO2 97% VS noted,  Constitutional: Pt appears well-developed and well-nourished.  HENT: Head: NCAT.  Right Ear: External ear normal.  Left Ear: External ear normal.  Eyes: Conjunctivae and EOM are normal. Pupils are equal,  round, and reactive to light.  Neck: Normal range of motion. Neck supple.  Cardiovascular: Normal rate and regular rhythm.   Pulmonary/Chest: Effort normal and breath sounds normal.  Abd:  Soft, NT, non-distended, + BS Neurological: Pt is alert. Not confused  Skin: Skin is warm. No erythema.  Psychiatric: Pt behavior is normal. Thought content normal.     Assessment & Plan:

## 2013-08-06 NOTE — Patient Instructions (Signed)
Please continue all other medications as before, and refills have been done if requested. Please have the pharmacy call with any other refills you may need. Please go to the LAB in the Basement (turn left off the elevator) for the tests to be done today You will be contacted by phone if any changes need to be made immediately.  Otherwise, you will receive a letter about your results with an explanation, but please check with MyChart first.  Depending on your kidney test results, you may want to see a kidney physician.  Please return in 6 months, or sooner if needed

## 2013-08-06 NOTE — Assessment & Plan Note (Signed)
For f/u bmet today, consider renal referral

## 2013-08-06 NOTE — Assessment & Plan Note (Signed)
stable overall by history and exam, recent data reviewed with pt, and pt to continue medical treatment as before,  to f/u any worsening symptoms or concerns Lab Results  Component Value Date   LDLCALC 73 06/26/2013

## 2013-08-13 ENCOUNTER — Other Ambulatory Visit: Payer: Self-pay | Admitting: Cardiovascular Disease

## 2013-08-13 ENCOUNTER — Other Ambulatory Visit: Payer: Self-pay

## 2013-08-13 DIAGNOSIS — E119 Type 2 diabetes mellitus without complications: Secondary | ICD-10-CM

## 2013-08-13 MED ORDER — GLIPIZIDE 10 MG PO TABS: 10.0000 mg | ORAL_TABLET | Freq: Two times a day (BID) | ORAL | Status: DC

## 2013-08-13 NOTE — Telephone Encounter (Signed)
To robin for routine refill

## 2013-08-13 NOTE — Telephone Encounter (Signed)
Last filled 04/08/13 with 3 refills

## 2013-08-16 ENCOUNTER — Telehealth: Payer: Self-pay

## 2013-08-16 NOTE — Telephone Encounter (Signed)
Received PA Approval for Januvia 100 mg tablet.  Authorization good until 08/17/15.  OK#[id number]

## 2013-08-22 ENCOUNTER — Telehealth: Payer: Self-pay | Admitting: Cardiovascular Disease

## 2013-08-22 NOTE — Telephone Encounter (Signed)
New message  Patient needs a RF on atorvastion, please call patient if any questions. Please call into [facility] on [address].

## 2013-09-09 ENCOUNTER — Other Ambulatory Visit: Payer: Self-pay | Admitting: *Deleted

## 2013-09-09 DIAGNOSIS — E119 Type 2 diabetes mellitus without complications: Secondary | ICD-10-CM

## 2013-09-09 MED ORDER — GLIPIZIDE 10 MG PO TABS: 10.0000 mg | ORAL_TABLET | Freq: Two times a day (BID) | ORAL | Status: DC

## 2013-09-16 ENCOUNTER — Other Ambulatory Visit: Payer: Self-pay

## 2013-09-16 DIAGNOSIS — C8590 Non-Hodgkin lymphoma, unspecified, unspecified site: Secondary | ICD-10-CM

## 2013-09-16 DIAGNOSIS — E119 Type 2 diabetes mellitus without complications: Secondary | ICD-10-CM

## 2013-09-16 DIAGNOSIS — I10 Essential (primary) hypertension: Secondary | ICD-10-CM

## 2013-09-16 MED ORDER — ATORVASTATIN CALCIUM 20 MG PO TABS: 20.0000 mg | ORAL_TABLET | Freq: Every day | ORAL | Status: DC

## 2013-09-16 MED ORDER — POTASSIUM CHLORIDE ER 10 MEQ PO TBCR: 10.0000 meq | EXTENDED_RELEASE_TABLET | Freq: Every day | ORAL | Status: DC

## 2013-09-16 MED ORDER — TORSEMIDE 20 MG PO TABS: ORAL_TABLET | ORAL | Status: DC

## 2013-09-30 ENCOUNTER — Telehealth: Payer: Self-pay | Admitting: Medical Oncology

## 2013-09-30 NOTE — Telephone Encounter (Signed)
Patient informed of CT appt change to 03/18 @ 1:30 with labs @ 12:30, d/t waiting for preauth from his insurance. Informed patient to not eat or drink four hours prior to exam as well as to drink one bottle of dye @ 11:30 and second bottle @ 12:30. Patient gave verbal understanding, encouraged him to call office should he have any questions or concerns.

## 2013-10-01 ENCOUNTER — Telehealth: Payer: Self-pay | Admitting: Oncology

## 2013-10-01 ENCOUNTER — Ambulatory Visit (HOSPITAL_COMMUNITY): Payer: Medicare HMO

## 2013-10-01 ENCOUNTER — Other Ambulatory Visit: Payer: Medicare Other

## 2013-10-01 NOTE — Telephone Encounter (Signed)
pt called to r/s 3/17 lab to 3/18. confirmed with pt appts for both 3/18 and 3/19.

## 2013-10-02 ENCOUNTER — Ambulatory Visit (HOSPITAL_COMMUNITY)
Admission: RE | Admit: 2013-10-02 | Discharge: 2013-10-02 | Disposition: A | Discharge status: Home / Self Care | Payer: Medicare HMO | Source: Ambulatory Visit | Attending: Oncology | Admitting: Oncology

## 2013-10-02 ENCOUNTER — Other Ambulatory Visit: Payer: Self-pay | Admitting: Oncology

## 2013-10-02 ENCOUNTER — Other Ambulatory Visit (HOSPITAL_BASED_OUTPATIENT_CLINIC_OR_DEPARTMENT_OTHER): Payer: Commercial Managed Care - HMO

## 2013-10-02 DIAGNOSIS — C8589 Other specified types of non-Hodgkin lymphoma, extranodal and solid organ sites: Secondary | ICD-10-CM | POA: Insufficient documentation

## 2013-10-02 DIAGNOSIS — C8599 Non-Hodgkin lymphoma, unspecified, extranodal and solid organ sites: Secondary | ICD-10-CM

## 2013-10-02 DIAGNOSIS — C8590 Non-Hodgkin lymphoma, unspecified, unspecified site: Secondary | ICD-10-CM

## 2013-10-02 DIAGNOSIS — C9110 Chronic lymphocytic leukemia of B-cell type not having achieved remission: Secondary | ICD-10-CM

## 2013-10-02 DIAGNOSIS — I700 Atherosclerosis of aorta: Secondary | ICD-10-CM | POA: Insufficient documentation

## 2013-10-02 LAB — CBC WITH DIFFERENTIAL/PLATELET
BASO%: 0.8 % (ref 0.0–2.0)
BASOS ABS: 0.1 10*3/uL (ref 0.0–0.1)
EOS ABS: 0.4 10*3/uL (ref 0.0–0.5)
EOS%: 4.4 % (ref 0.0–7.0)
HCT: 38.8 % (ref 38.4–49.9)
HGB: 12.7 g/dL — ABNORMAL LOW (ref 13.0–17.1)
LYMPH%: 16.7 % (ref 14.0–49.0)
MCH: 28.8 pg (ref 27.2–33.4)
MCHC: 32.6 g/dL (ref 32.0–36.0)
MCV: 88.3 fL (ref 79.3–98.0)
MONO#: 0.6 10*3/uL (ref 0.1–0.9)
MONO%: 7.6 % (ref 0.0–14.0)
NEUT%: 70.5 % (ref 39.0–75.0)
NEUTROS ABS: 5.9 10*3/uL (ref 1.5–6.5)
PLATELETS: 174 10*3/uL (ref 140–400)
RBC: 4.4 10*6/uL (ref 4.20–5.82)
RDW: 12.9 % (ref 11.0–14.6)
WBC: 8.3 10*3/uL (ref 4.0–10.3)
lymph#: 1.4 10*3/uL (ref 0.9–3.3)

## 2013-10-02 LAB — COMPREHENSIVE METABOLIC PANEL (CC13)
ALT: 24 U/L (ref 0–55)
AST: 13 U/L (ref 5–34)
Albumin: 4 g/dL (ref 3.5–5.0)
Alkaline Phosphatase: 86 U/L (ref 40–150)
Anion Gap: 11 mEq/L (ref 3–11)
BILIRUBIN TOTAL: 0.44 mg/dL (ref 0.20–1.20)
BUN: 49 mg/dL — AB (ref 7.0–26.0)
CO2: 24 mEq/L (ref 22–29)
Calcium: 9.6 mg/dL (ref 8.4–10.4)
Chloride: 99 mEq/L (ref 98–109)
Creatinine: 2.3 mg/dL — ABNORMAL HIGH (ref 0.7–1.3)
GLUCOSE: 322 mg/dL — AB (ref 70–140)
Potassium: 5.3 mEq/L — ABNORMAL HIGH (ref 3.5–5.1)
SODIUM: 134 meq/L — AB (ref 136–145)
Total Protein: 7.5 g/dL (ref 6.4–8.3)

## 2013-10-03 ENCOUNTER — Ambulatory Visit: Payer: Medicare Other | Admitting: Oncology

## 2013-10-03 ENCOUNTER — Telehealth: Payer: Self-pay | Admitting: Oncology

## 2013-10-03 NOTE — Telephone Encounter (Signed)
s.w. pt and r/s missed appt...pt aware of new d.t °

## 2013-10-11 ENCOUNTER — Other Ambulatory Visit: Payer: Commercial Managed Care - HMO

## 2013-10-11 ENCOUNTER — Ambulatory Visit (INDEPENDENT_AMBULATORY_CARE_PROVIDER_SITE_OTHER): Payer: Commercial Managed Care - HMO | Admitting: Cardiovascular Disease

## 2013-10-11 ENCOUNTER — Encounter: Payer: Self-pay | Admitting: Cardiovascular Disease

## 2013-10-11 VITALS — BP 104/70 | HR 74 | Ht 75.0 in | Wt 293.0 lb

## 2013-10-11 DIAGNOSIS — I509 Heart failure, unspecified: Secondary | ICD-10-CM

## 2013-10-11 DIAGNOSIS — I5042 Chronic combined systolic (congestive) and diastolic (congestive) heart failure: Secondary | ICD-10-CM

## 2013-10-11 NOTE — Patient Instructions (Addendum)
Continue same medications   Your physician wants you to follow-up in: 6 months. You will receive a reminder letter in the mail two months in advance. If you don't receive a letter, please call our office to schedule the follow-up appointment.Come fasting for lab work in 6 months.

## 2013-10-11 NOTE — Progress Notes (Signed)
[name] [name] Date of Birth  05-05-1946       [facility] Office [address], [address], [place], [place]  [address]   [place], [place]  [address] [phone]     [phone]   Fax  [phone]    Fax [phone]  Problem List: 1. Coronary artery disease 2. Ischemic cardiopathy with an ejection fraction of around 35% 3. Aortic stenosis - moderate 4.  Non hodgekins lymphoma 5. Type 2 diabetes mellitus 6.  Sleep apnea  History of Present Illness:  [name] [name] is a 68 yo with hx of CAD, CHF.  He has been seen at [facility] for the past year.  He has had progressive dyspnea for the past 4 months.   He has had increased swelling in his legs and ankles.  He has not been able to get back in with [facility] and wanted to come here for further evaluation.    He had 2 stents placed in Oct. 2013.  He did well for 6 months.  Then his condition started to decline - porgressive , severe DOE with any exertion.  Echo recently showed. Left ventricle: The cavity size was mildly dilated. Wall thickness was normal. Systolic function was mildly to  moderately reduced. The estimated ejection fraction was in the range of 40% to 45%. There is akinesis of the posterior and distal lateral myocardium; inferior hypokinesis. Doppler parameters are consistent with abnormal left ventricular relaxation (grade 1 diastolic dysfunction). - Mitral valve: Mild regurgitation. - Left atrium: The atrium was mildly dilated. PA pressures of 62.  January 23, 2013: [name] [name] is doing much better.  We started him on torsemide and he has diuresed quite nicely.  He has lost 17 pounds in the past 2 weeks.  He is still dyspneic with exertion.  He is trying to avoid salt but is having some challenges. No CP.   No PND.  No significant edema.  February 25, 2013: [name] [name] is dong well.  Breathing is better.  He is having some foot / toe pain.  Needs to keep his toenails  trimmed better.   Oct. 22, 2014: [name] [name] is doing well.  Recently diagnosed with diverticulosis.    Jan. 8, 2015: [name] [name] has gained weight since I saw him. His weight is up 14 pounds since his last office visit in October.  He has has been eating lots and lots of food since Christmas.  He has not bee eating much salt.   He is feeling disgusted with himself.  He knows that he had been eating too much and has not been exercising.  He want to [facility] for Christmas vacation.  He was not able to climb the 3 flights of stairs at his sister's house.  He started smoking cigarettes again for a month or so. He now has stopped again.    October 11, 2013: He has stopped smoking ( again)  Slowly improving with hi breathing.     Current Outpatient Prescriptions on File Prior to Visit  Medication Sig Dispense Refill  . AMBULATORY NON FORMULARY MEDICATION Home O2 @@ 2 LMP for 8 hrs at night      . AMBULATORY NON FORMULARY MEDICATION CPAP      . aspirin 81 MG EC tablet Take 1 tablet (81 mg total) by mouth daily. STOP NOW AND RESTART ON 06/06/2013  30 tablet  12  . atorvastatin (LIPITOR) 20 MG tablet  Take 1 tablet (20 mg total) by mouth daily at 6 PM.  90 tablet  1  . carvedilol (COREG) 12.5 MG tablet Take 1 tablet (12.5 mg total) by mouth 2 (two) times daily.  180 tablet  3  . glipiZIDE (GLUCOTROL) 10 MG tablet Take 1 tablet (10 mg total) by mouth 2 (two) times daily before a meal.  180 tablet  3  . lisinopril (PRINIVIL,ZESTRIL) 10 MG tablet Take 1 tablet (10 mg total) by mouth daily.  30 tablet  11  . nicotine (NICODERM CQ - DOSED IN MG/24 HOURS) 14 mg/24hr patch Place 1 patch onto the skin daily.      . potassium chloride (K-DUR) 10 MEQ tablet Take 1 tablet (10 mEq total) by mouth daily.  90 tablet  1  . sitaGLIPtin (JANUVIA) 100 MG tablet Take 1 tablet (100 mg total) by mouth daily.  30 tablet  11  . tiotropium (SPIRIVA) 18 MCG inhalation capsule Place 1 capsule (18 mcg total) into inhaler and  inhale daily.  30 capsule  6  . torsemide (DEMADEX) 20 MG tablet TAKE ONE TABLET BY MOUTH TWICE DAILY  180 tablet  1  . varenicline (CHANTIX CONTINUING MONTH PAK) 1 MG tablet Take 1 tablet (1 mg total) by mouth 2 (two) times daily.  60 tablet  1  . varenicline (CHANTIX STARTING MONTH PAK) 0.5 MG X 11 & 1 MG X 42 tablet Take one 0.5 mg tablet by mouth once daily for 3 days, then increase to one 0.5 mg tablet twice daily for 4 days, then increase to one 1 mg tablet twice daily.  53 tablet  0   Current Facility-Administered Medications on File Prior to Visit  Medication Dose Route Frequency Provider Last Rate Last Dose  . sodium chloride 0.9 % injection 10 mL  10 mL Intravenous PRN [name], MD   10 mL at 04/25/13 1100    No Known Allergies  Past Medical History  Diagnosis Date  . Ischemic cardiomyopathy   . Type II diabetes mellitus   . Hypertension   . Hypercholesterolemia   . Heart murmur   . Shortness of breath     "all the time" (06/06/2012)  . Lower GI bleeding 1980's    "when I was drinking alot" (06/06/2012)  . Arthritis     "both shoulders" (06/06/2012)  . Chronic combined systolic and diastolic CHF (congestive heart failure) 07/10/2012  . GERD (gastroesophageal reflux disease) 07/12/2012  . OSA on CPAP     uses a cpap  . Myocardial infarction 11/13  . Coronary artery disease   . Lymphoma   . History of alcoholism   . Sleep apnea   . Aortic stenosis   . Pancreatitis   . Diverticulosis   . Pneumonia   . Personal history of rectal adenoma 05/28/2013    Past Surgical History  Procedure Laterality Date  . Coronary angioplasty with stent placement  06/06/2012    "1"  . Lymph gland excision  07/13/2012    Procedure: CERVICAL LYMPH GLAND EXCISION;  Surgeon: Haywood Lasso, MD;  Location: [place];  Service: General;  Laterality: Right;  . Portacath placement  07/30/2012    Procedure: INSERTION PORT-A-CATH;  Surgeon: Haywood Lasso, MD;  Location: [place];  Service: General;  Laterality: Right;  . Colonoscopy N/A 05/28/2013    Procedure: COLONOSCOPY;  Surgeon: Gatha Mayer, MD;  [facility];  Service: Endoscopy;  Laterality: N/A;    History  Smoking status  . Former Smoker -- 1.00 packs/day for 53 years  . Types: Cigarettes  . Quit date: 07/19/2011  Smokeless tobacco  . Never Used    Comment: pt states he does not smoke as long as he uses patch    History  Alcohol Use No    Comment: 06/06/2012 "haven't had a drink in 22 years; I'm an alcoholic"    Family History  Problem Relation Age of Onset  . Osteoarthritis Mother   . Osteoarthritis Father   . Colon cancer Daughter   . Diabetes Father     entire family  . Heart disease Father     Reviw of Systems:  Reviewed in the HPI.  All other systems are negative. Wt Readings from Last 3 Encounters:  10/11/13 293 lb (132.904 kg)  08/06/13 283 lb (128.368 kg)  07/25/13 277 lb (125.646 kg)    Physical Exam: Blood pressure 104/70, pulse 74, height 6\' 3"  (1.905 m), weight 293 lb (132.904 kg). General: Well developed, well nourished, in no acute distress.    Head: Normocephalic, atraumatic, sclera non-icteric, mucus membranes are moist,   Neck: Supple. Carotids are 2 + without bruits. JVD 10  cm  Lungs: Clear   Heart: RR, 8-5/2 systolic murmur that decreases with inspiration.  Abdomen: Soft, non-tender, slightly edematous  Msk:  Strength and tone are normal   Extremities: No clubbing or cyanosis. No  edema,     Neuro: CN II - XII intact.  Alert and oriented X 3.   Psych:  Normal   ECG:  Jan. 8, 2015:  NSR at 84. TWI in the lateral leads.   Assessment / Plan:

## 2013-10-11 NOTE — Assessment & Plan Note (Signed)
[name] [name] seems to be doing okay from a cardiac standpoint. He's gained a bit of weight and thinks that he may be overeating. He's bought a treadmill and plans on starting to use that soon.  He's on good medical therapy. His blood pressure is well controlled. I've advised him to join [employer] or a similar weight reduction program. We discussed limiting his salt. I'll see him in 6 months. We'll check fasting labs at that time.

## 2013-10-14 ENCOUNTER — Ambulatory Visit: Payer: Medicare HMO | Admitting: Physician Assistant

## 2013-10-16 ENCOUNTER — Telehealth: Payer: Self-pay | Admitting: Oncology

## 2013-10-16 ENCOUNTER — Ambulatory Visit (HOSPITAL_BASED_OUTPATIENT_CLINIC_OR_DEPARTMENT_OTHER): Payer: Commercial Managed Care - HMO | Admitting: Oncology

## 2013-10-16 VITALS — BP 139/62 | HR 75 | Resp 20 | Ht 73.0 in | Wt 294.8 lb

## 2013-10-16 DIAGNOSIS — R0602 Shortness of breath: Secondary | ICD-10-CM

## 2013-10-16 DIAGNOSIS — R109 Unspecified abdominal pain: Secondary | ICD-10-CM

## 2013-10-16 DIAGNOSIS — C8599 Non-Hodgkin lymphoma, unspecified, extranodal and solid organ sites: Secondary | ICD-10-CM

## 2013-10-16 DIAGNOSIS — M25519 Pain in unspecified shoulder: Secondary | ICD-10-CM

## 2013-10-16 DIAGNOSIS — C9110 Chronic lymphocytic leukemia of B-cell type not having achieved remission: Secondary | ICD-10-CM

## 2013-10-16 DIAGNOSIS — C8590 Non-Hodgkin lymphoma, unspecified, unspecified site: Secondary | ICD-10-CM

## 2013-10-16 NOTE — Telephone Encounter (Signed)
gv pt appt schedule for may.  °

## 2013-10-16 NOTE — Progress Notes (Signed)
Hematology and Oncology Follow Up Visit  [name] [name] [id number] 31-Jan-1946 68 y.o. 10/16/2013 11:08 AM   Principle Diagnosis: 68 year old with stage IIIA SLL diagnosed in 06/2012. He presented with abdominal pain and diffuse lymphadenopathy.   Prior Therapy:  He is S/P lymph node biopsy that was done on 07/13/2012.  He is S/P 6 cycles of B-R started in 07/2012. He completed cycle 6 on 12/2012.    Current therapy: Observation and follow up.   Interim History: [name] [name] presents today for a follow up visit.  He presented with bulky adenopathy above and below the diaphragm and tolerated chemotherapy without complications and now continues to be in CR. Not reporting any abdominal pain or early satiety. He is not reporting any nausea or vomiting is not reporting any fevers or chills. No evidence to suggest relapsed disease. His appetite is reasonable his continued to gain weight. He is continuously to be busy remodeling his house.  Medications: I have reviewed the patient's current medications. Current Outpatient Prescriptions  Medication Sig Dispense Refill  . AMBULATORY NON FORMULARY MEDICATION Home O2 @@ 2 LMP for 8 hrs at night      . AMBULATORY NON FORMULARY MEDICATION CPAP      . aspirin 81 MG EC tablet Take 1 tablet (81 mg total) by mouth daily. STOP NOW AND RESTART ON 06/06/2013  30 tablet  12  . atorvastatin (LIPITOR) 20 MG tablet Take 1 tablet (20 mg total) by mouth daily at 6 PM.  90 tablet  1  . carvedilol (COREG) 12.5 MG tablet Take 1 tablet (12.5 mg total) by mouth 2 (two) times daily.  180 tablet  3  . glipiZIDE (GLUCOTROL) 10 MG tablet Take 1 tablet (10 mg total) by mouth 2 (two) times daily before a meal.  180 tablet  3  . lisinopril (PRINIVIL,ZESTRIL) 10 MG tablet Take 1 tablet (10 mg total) by mouth daily.  30 tablet  11  . potassium chloride (K-DUR) 10 MEQ tablet Take 1 tablet (10 mEq total) by mouth daily.  90 tablet  1  . sitaGLIPtin (JANUVIA) 100 MG tablet Take 1  tablet (100 mg total) by mouth daily.  30 tablet  11  . tiotropium (SPIRIVA) 18 MCG inhalation capsule Place 1 capsule (18 mcg total) into inhaler and inhale daily.  30 capsule  6  . torsemide (DEMADEX) 20 MG tablet TAKE ONE TABLET BY MOUTH TWICE DAILY  180 tablet  1  . varenicline (CHANTIX CONTINUING MONTH PAK) 1 MG tablet Take 1 tablet (1 mg total) by mouth 2 (two) times daily.  60 tablet  1  . varenicline (CHANTIX STARTING MONTH PAK) 0.5 MG X 11 & 1 MG X 42 tablet Take one 0.5 mg tablet by mouth once daily for 3 days, then increase to one 0.5 mg tablet twice daily for 4 days, then increase to one 1 mg tablet twice daily.  53 tablet  0   No current facility-administered medications for this visit.   Facility-Administered Medications Ordered in Other Visits  Medication Dose Route Frequency Provider Last Rate Last Dose  . sodium chloride 0.9 % injection 10 mL  10 mL Intravenous PRN [name], MD   10 mL at 04/25/13 1100     Allergies: No Known Allergies  Past Medical History, Surgical history, Social history, and Family History were reviewed and updated.  Review of Systems:  Remaining ROS negative.  Physical Exam: Blood pressure 139/62, pulse 75, temperature 0 F (-17.8 C), resp.  rate 20, height 6\' 1"  (1.854 m), weight 294 lb 12.8 oz (133.72 kg). ECOG: 1 General appearance: alert Head: Normocephalic, without obvious abnormality, atraumatic Neck: no adenopathy, no carotid bruit, no JVD, supple, symmetrical, trachea midline and thyroid not enlarged, symmetric, no tenderness/mass/nodules Lymph nodes: Cervical, supraclavicular, and axillary nodes normal. Heart:regular rate and rhythm, S1, S2 normal, no murmur, click, rub or gallop Lung:chest clear, no wheezing, rales, normal symmetric air entry Abdomen: soft, non-tender, without masses or organomegaly EXT:no erythema, induration, or nodules No lymph nodes noted on exam today.   Lab Results: Lab Results  Component Value Date    WBC 8.3 10/02/2013   HGB 12.7* 10/02/2013   HCT 38.8 10/02/2013   MCV 88.3 10/02/2013   PLT 174 10/02/2013     Chemistry      Component Value Date/Time   NA 134* 10/02/2013 1302   NA 136 08/06/2013 1211   K 5.3* 10/02/2013 1302   K 4.4 08/06/2013 1211   CL 101 08/06/2013 1211   CL 106 12/19/2012 0832   CO2 24 10/02/2013 1302   CO2 25 08/06/2013 1211   BUN 49.0* 10/02/2013 1302   BUN 29* 08/06/2013 1211   CREATININE 2.3* 10/02/2013 1302   CREATININE 1.5 08/06/2013 1211      Component Value Date/Time   CALCIUM 9.6 10/02/2013 1302   CALCIUM 9.1 08/06/2013 1211   ALKPHOS 86 10/02/2013 1302   ALKPHOS 71 06/26/2013 1223   AST 13 10/02/2013 1302   AST 14 06/26/2013 1223   ALT 24 10/02/2013 1302   ALT 19 06/26/2013 1223   BILITOT 0.44 10/02/2013 1302   BILITOT 0.6 06/26/2013 1223      EXAM:  CT CHEST, ABDOMEN AND PELVIS WITHOUT CONTRAST  TECHNIQUE:  Multidetector CT imaging of the chest, abdomen and pelvis was  performed following the standard protocol without IV contrast.  COMPARISON: 01/29/2013  FINDINGS:  CT CHEST FINDINGS  The heart size is normal. There is no pericardial effusion.  Calcified atherosclerotic disease involves the thoracic aorta as  well as the LAD, RCA and left circumflex coronary arteries. Index  pre-vascular lymph node measures 9 mm. Right paratracheal lymph node  measures 1.2 cm, image 25/series 2. Previously 1.4 cm. No new or  enlarging mediastinal or hilar lymph nodes. No axillary adenopathy.  No enlarged supraclavicular lymph nodes.  There is no pleural effusion identified. No airspace consolidation  or atelectasis. No suspicious pulmonary nodule or mass.  Review of the visualized bony structures is significant for mild  thoracic spondylosis. No aggressive lytic or sclerotic bone lesions  identified.  CT ABDOMEN AND PELVIS FINDINGS  No liver abnormality identified. The gallbladder is normal. No  biliary dilatation. Normal appearance of the pancreas. The spleen is   unremarkable.  The adrenal glands are both normal. Normal appearance of both  kidneys. The urinary bladder appears normal. The prostate gland and  seminal vesicles are unremarkable.  Calcified atherosclerotic disease involves the abdominal aorta and  its branches. No aneurysm. There is no pelvic or inguinal adenopathy  identified.  The stomach is normal. The small bowel loops have a normal course  and caliber. The appendix is visualized and appears normal. Normal  appearance of the proximal colon. Sigmoid diverticula are  identified. No acute inflammation.  Review of the visualized bony structure is significant for lumbar  spondylosis. No aggressive lytic or sclerotic bone lesion  IMPRESSION:  CT chest:  1. Stable to improved appearance of borderline mediastinal  adenopathy.  2. No new or  progressive findings identified within the chest.  CT abdomen and pelvis:  1. No evidence for mass or adenopathy within the abdomen or pelvis.    Impression and Plan:  This is a 68 year old gentleman with the following issues: 1. Chronic lymphocytic leukemia/small lymphocytic lymphoma. He is S/P B-R chemotherapy. He is toleratedt well without complications. CT on 10/02/2013 was discussed today and showed complete response to therapy.  We will continue observation and surveillance and certainly initiate chemotherapy again upon the relapse.  I will repeat his CT scan in 8 months.  2. Abdominal pain: This have resolved after treatment of diverticulitis.  3. Nausea prophylaxis. on Zofran without major issues.   4. Shortness of breath:  this has improved at this time.  5. Shoulder pain: Due to osteoarthritis. Improved now.   6. Follow up: In 2 months for a Port-A-Cath flush in M.D. visit in 4 months.      Prashant Glosser 4/1/201511:08 AM

## 2013-10-17 ENCOUNTER — Ambulatory Visit: Payer: Commercial Managed Care - HMO | Admitting: Oncology

## 2013-12-10 MED ORDER — DEXAMETHASONE SODIUM PHOSPHATE 20 MG/5ML IJ SOLN
INTRAMUSCULAR | Status: AC
  Filled 2013-12-10: qty 5

## 2013-12-10 MED ORDER — PALONOSETRON HCL INJECTION 0.25 MG/5ML
INTRAVENOUS | Status: AC
  Filled 2013-12-10: qty 5

## 2013-12-11 ENCOUNTER — Ambulatory Visit (HOSPITAL_BASED_OUTPATIENT_CLINIC_OR_DEPARTMENT_OTHER): Payer: Commercial Managed Care - HMO

## 2013-12-11 ENCOUNTER — Other Ambulatory Visit: Payer: Self-pay | Admitting: *Deleted

## 2013-12-11 VITALS — BP 114/61 | HR 86 | Temp 97.0°F

## 2013-12-11 DIAGNOSIS — Z95828 Presence of other vascular implants and grafts: Secondary | ICD-10-CM

## 2013-12-11 DIAGNOSIS — C9110 Chronic lymphocytic leukemia of B-cell type not having achieved remission: Secondary | ICD-10-CM

## 2013-12-11 DIAGNOSIS — Z452 Encounter for adjustment and management of vascular access device: Secondary | ICD-10-CM

## 2013-12-11 DIAGNOSIS — C8599 Non-Hodgkin lymphoma, unspecified, extranodal and solid organ sites: Secondary | ICD-10-CM

## 2013-12-11 MED ORDER — HEPARIN SOD (PORK) LOCK FLUSH 100 UNIT/ML IV SOLN
500.0000 [IU] | Freq: Once | INTRAVENOUS | Status: AC
  Administered 2013-12-11: 500 [IU] via INTRAVENOUS
  Filled 2013-12-11: qty 5

## 2013-12-11 MED ORDER — LIDOCAINE-PRILOCAINE 2.5-2.5 % EX CREA: 1.0000 "application " | TOPICAL_CREAM | CUTANEOUS | Status: DC | PRN

## 2013-12-11 MED ORDER — SODIUM CHLORIDE 0.9 % IJ SOLN
10.0000 mL | INTRAMUSCULAR | Status: DC | PRN
  Administered 2013-12-11: 10 mL via INTRAVENOUS
  Filled 2013-12-11: qty 10

## 2013-12-11 NOTE — Telephone Encounter (Signed)
Patient in lobby requesting script for emla cream. E-scribed to [facility], per dr Alen Blew.

## 2014-02-04 ENCOUNTER — Telehealth: Payer: Self-pay

## 2014-02-04 ENCOUNTER — Ambulatory Visit (INDEPENDENT_AMBULATORY_CARE_PROVIDER_SITE_OTHER): Payer: Medicare HMO | Admitting: Internal Medicine

## 2014-02-04 ENCOUNTER — Ambulatory Visit (INDEPENDENT_AMBULATORY_CARE_PROVIDER_SITE_OTHER)
Admission: RE | Admit: 2014-02-04 | Discharge: 2014-02-04 | Disposition: A | Discharge status: Home / Self Care | Payer: Commercial Managed Care - HMO | Source: Ambulatory Visit | Attending: Internal Medicine | Admitting: Internal Medicine

## 2014-02-04 ENCOUNTER — Other Ambulatory Visit (INDEPENDENT_AMBULATORY_CARE_PROVIDER_SITE_OTHER): Payer: Commercial Managed Care - HMO

## 2014-02-04 ENCOUNTER — Encounter: Payer: Self-pay | Admitting: Internal Medicine

## 2014-02-04 VITALS — BP 120/80 | HR 97 | Temp 98.2°F | Ht 75.0 in | Wt 302.2 lb

## 2014-02-04 DIAGNOSIS — M19019 Primary osteoarthritis, unspecified shoulder: Secondary | ICD-10-CM

## 2014-02-04 DIAGNOSIS — Z0000 Encounter for general adult medical examination without abnormal findings: Secondary | ICD-10-CM

## 2014-02-04 DIAGNOSIS — E118 Type 2 diabetes mellitus with unspecified complications: Secondary | ICD-10-CM

## 2014-02-04 DIAGNOSIS — R0609 Other forms of dyspnea: Secondary | ICD-10-CM

## 2014-02-04 DIAGNOSIS — R0989 Other specified symptoms and signs involving the circulatory and respiratory systems: Secondary | ICD-10-CM

## 2014-02-04 DIAGNOSIS — Z23 Encounter for immunization: Secondary | ICD-10-CM

## 2014-02-04 LAB — URINALYSIS, ROUTINE W REFLEX MICROSCOPIC
Bilirubin Urine: NEGATIVE
Hgb urine dipstick: NEGATIVE
Ketones, ur: NEGATIVE
Leukocytes, UA: NEGATIVE
Nitrite: NEGATIVE
RBC / HPF: NONE SEEN
Specific Gravity, Urine: <=1.005 — AB
Total Protein, Urine: NEGATIVE
Urine Glucose: >=1000 — AB
Urobilinogen, UA: 0.2
WBC, UA: NONE SEEN
pH: 5 (ref 5.0–8.0)

## 2014-02-04 LAB — CBC WITH DIFFERENTIAL/PLATELET
BASOS ABS: 0 10*3/uL (ref 0.0–0.1)
Basophils Relative: 0.2 % (ref 0.0–3.0)
Eosinophils Absolute: 0.2 10*3/uL (ref 0.0–0.7)
Eosinophils Relative: 3.7 % (ref 0.0–5.0)
HCT: 34.4 % — ABNORMAL LOW (ref 39.0–52.0)
HEMOGLOBIN: 11.5 g/dL — AB (ref 13.0–17.0)
LYMPHS ABS: 1.6 10*3/uL (ref 0.7–4.0)
Lymphocytes Relative: 24.6 % (ref 12.0–46.0)
MCHC: 33.5 g/dL (ref 30.0–36.0)
MCV: 85.4 fl (ref 78.0–100.0)
Monocytes Absolute: 0.6 10*3/uL (ref 0.1–1.0)
Monocytes Relative: 9.3 % (ref 3.0–12.0)
NEUTROS ABS: 4 10*3/uL (ref 1.4–7.7)
Neutrophils Relative %: 62.2 % (ref 43.0–77.0)
Platelets: 166 10*3/uL (ref 150.0–400.0)
RBC: 4.02 Mil/uL — ABNORMAL LOW (ref 4.22–5.81)
RDW: 13.7 % (ref 11.5–15.5)
WBC: 6.4 10*3/uL (ref 4.0–10.5)

## 2014-02-04 LAB — HEMOGLOBIN A1C: Hgb A1c MFr Bld: 18.5 % — ABNORMAL HIGH (ref 4.6–6.5)

## 2014-02-04 LAB — PSA: PSA: 0.58 ng/mL (ref 0.10–4.00)

## 2014-02-04 LAB — HEPATIC FUNCTION PANEL
ALT: 42 U/L (ref 0–53)
AST: 37 U/L (ref 0–37)
Albumin: 3.8 g/dL (ref 3.5–5.2)
Alkaline Phosphatase: 95 U/L (ref 39–117)
Bilirubin, Direct: 0.1 mg/dL (ref 0.0–0.3)
Total Bilirubin: 0.5 mg/dL (ref 0.2–1.2)
Total Protein: 6.9 g/dL (ref 6.0–8.3)

## 2014-02-04 LAB — LIPID PANEL
Cholesterol: 154 mg/dL (ref 0–200)
HDL: 35.2 mg/dL — ABNORMAL LOW (ref >39.00)
LDL CALC: 51 mg/dL (ref 0–99)
NONHDL: 118.8
Total CHOL/HDL Ratio: 4
Triglycerides: 340 mg/dL — ABNORMAL HIGH (ref 0.0–149.0)
VLDL: 68 mg/dL — AB (ref 0.0–40.0)

## 2014-02-04 LAB — BASIC METABOLIC PANEL
BUN: 51 mg/dL — ABNORMAL HIGH (ref 6–23)
CALCIUM: 9.2 mg/dL (ref 8.4–10.5)
CO2: 27 mEq/L (ref 19–32)
Chloride: 92 mEq/L — ABNORMAL LOW (ref 96–112)
Creatinine, Ser: 2.9 mg/dL — ABNORMAL HIGH (ref 0.4–1.5)
GFR: 28.14 mL/min — AB (ref >60.00)
Glucose, Bld: 538 mg/dL (ref 70–99)
Potassium: 4.8 mEq/L (ref 3.5–5.1)
SODIUM: 128 meq/L — AB (ref 135–145)

## 2014-02-04 LAB — MICROALBUMIN / CREATININE URINE RATIO
Creatinine,U: 90.8 mg/dL
MICROALB UR: 0.3 mg/dL (ref 0.0–1.9)
Microalb Creat Ratio: 0.3 mg/g (ref 0.0–30.0)

## 2014-02-04 LAB — TSH: TSH: 0.76 u[IU]/mL (ref 0.35–4.50)

## 2014-02-04 MED ORDER — ALBUTEROL SULFATE HFA 108 (90 BASE) MCG/ACT IN AERS: 2.0000 | INHALATION_SPRAY | Freq: Four times a day (QID) | RESPIRATORY_TRACT | Status: AC | PRN

## 2014-02-04 NOTE — Assessment & Plan Note (Signed)

## 2014-02-04 NOTE — Assessment & Plan Note (Signed)
Suspect related to restrictive lung dz related to wt gain, for cxr today, cont spiriva, also for trial albut MDI prn

## 2014-02-04 NOTE — Assessment & Plan Note (Signed)
stable overall by history and exam, recent data reviewed with pt, and pt to continue medical treatment as before,  to f/u any worsening symptoms or concerns Lab Results  Component Value Date   HGBA1C 8.7* 06/26/2013

## 2014-02-04 NOTE — Progress Notes (Signed)
Subjective:    Patient ID: [name] [name], male    DOB: 1945/12/14, 68 y.o.   MRN: [id number]  HPI  Here for wellness and f/u;  Overall doing ok;  Pt denies CP, worsening SOB, DOE, wheezing, orthopnea, PND, worsening LE edema, palpitations, dizziness or syncope, except for mild worsening sob/doe , cant use his exercise equipment due to this.  C/o fatigue, leg fatigue and weakness , doe with walking 100 ft, "feels like I run a marathon." Also with sob/doe shopping as well.  Pt denies neurological change such as new headache, facial or extremity weakness.  Pt denies polydipsia, polyuria, or low sugar symptoms. Pt states overall good compliance with treatment and medications, good tolerability, and has been trying to follow lower cholesterol diet.  Pt denies worsening depressive symptoms, suicidal ideation or panic. No fever, night sweats, wt loss, loss of appetite, or other constitutional symptoms.  Pt states good ability with ADL's, has low fall risk, home safety reviewed and adequate, no other significant changes in hearing or vision, and only occasionally active with exercise. June 2014 with dobut echo stress test - LV EF 35-40%., neg for ischemia  Sees Dr Acie Fredrickson, due for f/u appt sept 2015 Has also gained wt 20 lbs since jan 2015, has been drinking lately 2 L per day sugar sodas. More abd obesity, now cannot bend to tie shoes.   Overall good compliance with treatment, and good medicine tolerability, including the diuretic.  Has not smoked since feb 2015, struggling to stay quit however, with nicotine patch started again last PM.  Has known bilat shoulder pain/djd, plans to see Dr Tamala Julian in our office again for cortisone soon.   Past Medical History  Diagnosis Date  . Ischemic cardiomyopathy   . Type II diabetes mellitus   . Hypertension   . Hypercholesterolemia   . Heart murmur   . Shortness of breath     "all the time" (06/06/2012)  . Lower GI bleeding 1980's    "when I was drinking alot"  (06/06/2012)  . Arthritis     "both shoulders" (06/06/2012)  . Chronic combined systolic and diastolic CHF (congestive heart failure) 07/10/2012  . GERD (gastroesophageal reflux disease) 07/12/2012  . OSA on CPAP     uses a cpap  . Myocardial infarction 11/13  . Coronary artery disease   . Lymphoma   . History of alcoholism   . Sleep apnea   . Aortic stenosis   . Pancreatitis   . Diverticulosis   . Pneumonia   . Personal history of rectal adenoma 05/28/2013   Past Surgical History  Procedure Laterality Date  . Coronary angioplasty with stent placement  06/06/2012    "1"  . Lymph gland excision  07/13/2012    Procedure: CERVICAL LYMPH GLAND EXCISION;  Surgeon: Haywood Lasso, MD;  Location: [facility];  Service: General;  Laterality: Right;  . Portacath placement  07/30/2012    Procedure: INSERTION PORT-A-CATH;  Surgeon: Haywood Lasso, MD;  Location: [facility];  Service: General;  Laterality: Right;  . Colonoscopy N/A 05/28/2013    Procedure: COLONOSCOPY;  Surgeon: Gatha Mayer, MD;  Location: [facility];  Service: Endoscopy;  Laterality: N/A;    reports that he quit smoking about 2 years ago. His smoking use included Cigarettes. He has a 53 pack-year smoking history. He has never used smokeless tobacco. He reports that he does not drink alcohol or use illicit drugs. family history includes Colon cancer in his daughter;  Diabetes in his father; Heart disease in his father; Osteoarthritis in his father and mother. No Known Allergies Current Outpatient Prescriptions on File Prior to Visit  Medication Sig Dispense Refill  . AMBULATORY NON FORMULARY MEDICATION Home O2 @@ 2 LMP for 8 hrs at night      . AMBULATORY NON FORMULARY MEDICATION CPAP      . aspirin 81 MG EC tablet Take 1 tablet (81 mg total) by mouth daily. STOP NOW AND RESTART ON 06/06/2013  30 tablet  12  . atorvastatin (LIPITOR) 20 MG tablet Take 1 tablet (20 mg total) by mouth daily at 6 PM.  90  tablet  1  . carvedilol (COREG) 12.5 MG tablet Take 1 tablet (12.5 mg total) by mouth 2 (two) times daily.  180 tablet  3  . glipiZIDE (GLUCOTROL) 10 MG tablet Take 1 tablet (10 mg total) by mouth 2 (two) times daily before a meal.  180 tablet  3  . lidocaine-prilocaine (EMLA) cream Apply 1 application topically as needed.  30 g  3  . lisinopril (PRINIVIL,ZESTRIL) 10 MG tablet Take 1 tablet (10 mg total) by mouth daily.  30 tablet  11  . potassium chloride (K-DUR) 10 MEQ tablet Take 1 tablet (10 mEq total) by mouth daily.  90 tablet  1  . sitaGLIPtin (JANUVIA) 100 MG tablet Take 1 tablet (100 mg total) by mouth daily.  30 tablet  11  . tiotropium (SPIRIVA) 18 MCG inhalation capsule Place 1 capsule (18 mcg total) into inhaler and inhale daily.  30 capsule  6  . torsemide (DEMADEX) 20 MG tablet TAKE ONE TABLET BY MOUTH TWICE DAILY  180 tablet  1  . varenicline (CHANTIX CONTINUING MONTH PAK) 1 MG tablet Take 1 tablet (1 mg total) by mouth 2 (two) times daily.  60 tablet  1  . varenicline (CHANTIX STARTING MONTH PAK) 0.5 MG X 11 & 1 MG X 42 tablet Take one 0.5 mg tablet by mouth once daily for 3 days, then increase to one 0.5 mg tablet twice daily for 4 days, then increase to one 1 mg tablet twice daily.  53 tablet  0   Current Facility-Administered Medications on File Prior to Visit  Medication Dose Route Frequency Provider Last Rate Last Dose  . sodium chloride 0.9 % injection 10 mL  10 mL Intravenous PRN [name], MD   10 mL at 04/25/13 1100   Review of Systems Constitutional: Negative for increased diaphoresis, other activity, appetite or other siginficant weight change  HENT: Negative for worsening hearing loss, ear pain, facial swelling, mouth sores and neck stiffness.   Eyes: Negative for other worsening pain, redness or visual disturbance.  Respiratory: Negative for shortness of breath and wheezing.   Cardiovascular: Negative for chest pain and palpitations.  Gastrointestinal:  Negative for diarrhea, blood in stool, abdominal distention or other pain Genitourinary: Negative for hematuria, flank pain or change in urine volume.  Musculoskeletal: Negative for myalgias or other joint complaints.  Skin: Negative for color change and wound.  Neurological: Negative for syncope and numbness. other than noted Hematological: Negative for adenopathy. or other swelling Psychiatric/Behavioral: Negative for hallucinations, self-injury, decreased concentration or other worsening agitation.      Objective:   Physical Exam BP 120/80  Pulse 97  Temp(Src) 98.2 F (36.8 C) (Oral)  Ht 6\' 3"  (1.905 m)  Wt 302 lb 4 oz (137.1 kg)  BMI 37.78 kg/m2  SpO2 93% VS noted, not wearing O2 today Constitutional: Pt  is oriented to person, place, and time. Appears well-developed and well-nourished.  Head: Normocephalic and atraumatic.  Right Ear: External ear normal.  Left Ear: External ear normal.  Nose: Nose normal.  Mouth/Throat: Oropharynx is clear and moist.  Eyes: Conjunctivae and EOM are normal. Pupils are equal, round, and reactive to light.  Neck: Normal range of motion. Neck supple. No JVD present. No tracheal deviation present.  Cardiovascular: Normal rate, regular rhythm, normal heart sounds and intact distal pulses.   Pulmonary/Chest: Effort normal and breath sounds without rales or wheezing  Abdominal: Soft. Bowel sounds are normal. NT. No HSM  Musculoskeletal: Normal range of motion. Exhibits trace to 1+ edema to knees Lymphadenopathy:  Has no cervical adenopathy.  Neurological: Pt is alert and oriented to person, place, and time. Pt has normal reflexes. No cranial nerve deficit. Motor grossly intact Skin: Skin is warm and dry. No rash noted.  Psychiatric:  Has normal mood and affect. Behavior is normal.     Assessment & Plan:  \

## 2014-02-04 NOTE — Progress Notes (Signed)
Pre visit review using our clinic review tool, if applicable. No additional management support is needed unless otherwise documented below in the visit note. 

## 2014-02-04 NOTE — Addendum Note (Signed)
Addended by: Sharon Seller B on: 02/04/2014 11:50 AM   Modules accepted: Orders

## 2014-02-04 NOTE — Assessment & Plan Note (Signed)
To f/u with [facility] /dr [employer]

## 2014-02-04 NOTE — Patient Instructions (Addendum)
You had the new Prevnar pneumonia shot today  Please take all new medication as prescribed  - the inhaler as needed  Please continue all other medications as before, and refills have been done if requested.  Please have the pharmacy call with any other refills you may need.  Please continue your efforts at being more active, low cholesterol diet, and weight control.  You are otherwise up to date with prevention measures today.  Please keep your appointments with your specialists as you may have planned  Please make an appt with Dr Tamala Julian as you leave today for your shoulder pain  Please go to the XRAY Department in the Basement (go straight as you get off the elevator) for the x-ray testing  Please go to the LAB in the Basement (turn left off the elevator) for the tests to be done today  You will be contacted by phone if any changes need to be made immediately.  Otherwise, you will receive a letter about your results with an explanation, but please check with MyChart first.  Please remember to sign up for MyChart if you have not done so, as this will be important to you in the future with finding out test results, communicating by private email, and scheduling acute appointments online when needed.  Please return in 6 months, or sooner if needed

## 2014-02-04 NOTE — Telephone Encounter (Signed)
Critical lab result Glucose 538 a1c  18.5

## 2014-02-05 ENCOUNTER — Encounter (HOSPITAL_COMMUNITY): Payer: Self-pay | Admitting: Emergency Medicine

## 2014-02-05 ENCOUNTER — Emergency Department (HOSPITAL_COMMUNITY)
Admission: EM | Admit: 2014-02-05 | Discharge: 2014-02-05 | Disposition: A | Discharge status: Home / Self Care | Payer: Medicare HMO | Attending: Emergency Medicine | Admitting: Emergency Medicine

## 2014-02-05 DIAGNOSIS — G4733 Obstructive sleep apnea (adult) (pediatric): Secondary | ICD-10-CM | POA: Insufficient documentation

## 2014-02-05 DIAGNOSIS — Z8572 Personal history of non-Hodgkin lymphomas: Secondary | ICD-10-CM | POA: Insufficient documentation

## 2014-02-05 DIAGNOSIS — E7800 Pure hypercholesterolemia, unspecified: Secondary | ICD-10-CM | POA: Insufficient documentation

## 2014-02-05 DIAGNOSIS — N189 Chronic kidney disease, unspecified: Secondary | ICD-10-CM | POA: Insufficient documentation

## 2014-02-05 DIAGNOSIS — Z79899 Other long term (current) drug therapy: Secondary | ICD-10-CM | POA: Insufficient documentation

## 2014-02-05 DIAGNOSIS — N289 Disorder of kidney and ureter, unspecified: Secondary | ICD-10-CM

## 2014-02-05 DIAGNOSIS — I5042 Chronic combined systolic (congestive) and diastolic (congestive) heart failure: Secondary | ICD-10-CM | POA: Insufficient documentation

## 2014-02-05 DIAGNOSIS — Z8701 Personal history of pneumonia (recurrent): Secondary | ICD-10-CM | POA: Insufficient documentation

## 2014-02-05 DIAGNOSIS — I2510 Atherosclerotic heart disease of native coronary artery without angina pectoris: Secondary | ICD-10-CM | POA: Insufficient documentation

## 2014-02-05 DIAGNOSIS — Z8719 Personal history of other diseases of the digestive system: Secondary | ICD-10-CM | POA: Insufficient documentation

## 2014-02-05 DIAGNOSIS — Z9861 Coronary angioplasty status: Secondary | ICD-10-CM | POA: Insufficient documentation

## 2014-02-05 DIAGNOSIS — E119 Type 2 diabetes mellitus without complications: Secondary | ICD-10-CM | POA: Insufficient documentation

## 2014-02-05 DIAGNOSIS — M19019 Primary osteoarthritis, unspecified shoulder: Secondary | ICD-10-CM | POA: Insufficient documentation

## 2014-02-05 DIAGNOSIS — R739 Hyperglycemia, unspecified: Secondary | ICD-10-CM

## 2014-02-05 DIAGNOSIS — I252 Old myocardial infarction: Secondary | ICD-10-CM | POA: Insufficient documentation

## 2014-02-05 DIAGNOSIS — I129 Hypertensive chronic kidney disease with stage 1 through stage 4 chronic kidney disease, or unspecified chronic kidney disease: Secondary | ICD-10-CM | POA: Insufficient documentation

## 2014-02-05 DIAGNOSIS — E669 Obesity, unspecified: Secondary | ICD-10-CM | POA: Insufficient documentation

## 2014-02-05 DIAGNOSIS — Z87891 Personal history of nicotine dependence: Secondary | ICD-10-CM | POA: Insufficient documentation

## 2014-02-05 DIAGNOSIS — R011 Cardiac murmur, unspecified: Secondary | ICD-10-CM | POA: Insufficient documentation

## 2014-02-05 DIAGNOSIS — Z7982 Long term (current) use of aspirin: Secondary | ICD-10-CM | POA: Insufficient documentation

## 2014-02-05 LAB — CBC WITH DIFFERENTIAL/PLATELET
Basophils Absolute: 0 10*3/uL (ref 0.0–0.1)
Basophils Relative: 0 % (ref 0–1)
Eosinophils Absolute: 0.2 10*3/uL (ref 0.0–0.7)
Eosinophils Relative: 3 % (ref 0–5)
HCT: 32.9 % — ABNORMAL LOW (ref 39.0–52.0)
HEMOGLOBIN: 10.9 g/dL — AB (ref 13.0–17.0)
LYMPHS ABS: 1.5 10*3/uL (ref 0.7–4.0)
LYMPHS PCT: 27 % (ref 12–46)
MCH: 27.9 pg (ref 26.0–34.0)
MCHC: 33.1 g/dL (ref 30.0–36.0)
MCV: 84.1 fL (ref 78.0–100.0)
MONOS PCT: 7 % (ref 3–12)
Monocytes Absolute: 0.4 10*3/uL (ref 0.1–1.0)
NEUTROS ABS: 3.6 10*3/uL (ref 1.7–7.7)
NEUTROS PCT: 63 % (ref 43–77)
Platelets: 143 10*3/uL — ABNORMAL LOW (ref 150–400)
RBC: 3.91 MIL/uL — AB (ref 4.22–5.81)
RDW: 13.1 % (ref 11.5–15.5)
WBC: 5.8 10*3/uL (ref 4.0–10.5)

## 2014-02-05 LAB — URINALYSIS, ROUTINE W REFLEX MICROSCOPIC
BILIRUBIN URINE: NEGATIVE
Glucose, UA: >1000 mg/dL — AB
HGB URINE DIPSTICK: NEGATIVE
KETONES UR: NEGATIVE mg/dL
Leukocytes, UA: NEGATIVE
Nitrite: NEGATIVE
PH: 6 (ref 5.0–8.0)
Protein, ur: NEGATIVE mg/dL
SPECIFIC GRAVITY, URINE: 1.019 (ref 1.005–1.030)
Urobilinogen, UA: 0.2 mg/dL (ref 0.0–1.0)

## 2014-02-05 LAB — COMPREHENSIVE METABOLIC PANEL
ALT: 31 U/L (ref 0–53)
ANION GAP: 15 (ref 5–15)
AST: 23 U/L (ref 0–37)
Albumin: 3.6 g/dL (ref 3.5–5.2)
Alkaline Phosphatase: 98 U/L (ref 39–117)
BILIRUBIN TOTAL: 0.3 mg/dL (ref 0.3–1.2)
BUN: 58 mg/dL — AB (ref 6–23)
CHLORIDE: 94 meq/L — AB (ref 96–112)
CO2: 23 meq/L (ref 19–32)
Calcium: 9.5 mg/dL (ref 8.4–10.5)
Creatinine, Ser: 2.44 mg/dL — ABNORMAL HIGH (ref 0.50–1.35)
GFR calc non Af Amer: 26 mL/min — ABNORMAL LOW (ref >90)
GFR, EST AFRICAN AMERICAN: 30 mL/min — AB (ref >90)
GLUCOSE: 463 mg/dL — AB (ref 70–99)
POTASSIUM: 5.3 meq/L (ref 3.7–5.3)
SODIUM: 132 meq/L — AB (ref 137–147)
Total Protein: 6.9 g/dL (ref 6.0–8.3)

## 2014-02-05 LAB — CBG MONITORING, ED
GLUCOSE-CAPILLARY: 360 mg/dL — AB (ref 70–99)
Glucose-Capillary: 388 mg/dL — ABNORMAL HIGH (ref 70–99)
Glucose-Capillary: 393 mg/dL — ABNORMAL HIGH (ref 70–99)

## 2014-02-05 LAB — URINE MICROSCOPIC-ADD ON: URINE-OTHER: NONE SEEN

## 2014-02-05 MED ORDER — SODIUM CHLORIDE 0.9 % IJ SOLN
INTRAMUSCULAR | Status: DC
Start: 2014-02-05 — End: 2014-02-05
  Filled 2014-02-05: qty 10

## 2014-02-05 MED ORDER — SODIUM CHLORIDE 0.9 % IV SOLN
INTRAVENOUS | Status: DC
  Administered 2014-02-05: 3.3 [IU]/h via INTRAVENOUS
  Filled 2014-02-05: qty 1

## 2014-02-05 MED ORDER — SODIUM CHLORIDE 0.9 % IV BOLUS (SEPSIS)
1000.0000 mL | Freq: Once | INTRAVENOUS | Status: AC
  Administered 2014-02-05: 1000 mL via INTRAVENOUS

## 2014-02-05 MED ORDER — HEPARIN SOD (PORK) LOCK FLUSH 100 UNIT/ML IV SOLN
500.0000 [IU] | Freq: Once | INTRAVENOUS | Status: AC
  Administered 2014-02-05: 500 [IU]
  Filled 2014-02-05: qty 5

## 2014-02-05 NOTE — Discharge Instructions (Signed)
Chronic Kidney Disease  Chronic kidney disease occurs when the kidneys are damaged over a long period. The kidneys are two organs that lie on either side of the spine between the middle of the back and the front of the abdomen. The kidneys:   · Remove wastes and extra water from the blood.    · Produce important hormones. These help keep bones strong, regulate blood pressure, and help create red blood cells.    · Balance the fluids and chemicals in the blood and tissues.  A small amount of kidney damage may not cause problems, but a large amount of damage may make it difficult or impossible for the kidneys to work the way they should. If steps are not taken to slow down the kidney damage or stop it from getting worse, the kidneys may stop working permanently. Most of the time, chronic kidney disease does not go away. However, it can often be controlled, and those with the disease can usually live normal lives.  CAUSES   The most common causes of chronic kidney disease are diabetes and high blood pressure (hypertension). Chronic kidney disease may also be caused by:   · Diseases that cause kidneys' filters to become inflamed.    · Diseases that affect the immune system.    · Genetic diseases.    · Medicines that damage the kidneys, such as anti-inflammatory medicines.    · Poisoning or exposure to toxic substances.    · A reoccurring kidney or urinary infection.    · A problem with urine flow. This may be caused by:    ¨ Cancer.    ¨ Kidney stones.    ¨ An enlarged prostate in males.  SYMPTOMS   Because the kidney damage in chronic kidney disease occurs slowly, symptoms develop slowly and may not be obvious until the kidney damage becomes severe. A person may have a kidney disease for years without showing any symptoms. Symptoms can include:   · Swelling (edema) of the legs, ankles, or feet.    · Tiredness (lethargy).    · Nausea or vomiting.    · Confusion.    · Problems with urination, such as:    ¨ Decreased urine  production.    ¨ Frequent urination, especially at night.    ¨ Frequent accidents in children who are potty trained.    · Muscle twitches and cramps.    · Shortness of breath.   · Weakness.    · Persistent itchiness.    · Loss of appetite.  · Metallic taste in the mouth.  · Trouble sleeping.  · Slowed development in children.  · Short stature in children.  DIAGNOSIS   Chronic kidney disease may be detected and diagnosed by tests, including blood, urine, imaging, or kidney biopsy tests.   TREATMENT   Most chronic kidney diseases cannot be cured. Treatment usually involves relieving symptoms and preventing or slowing the progression of the disease. Treatment may include:   · A special diet. You may need to avoid alcohol and foods that are salty and high in potassium.    · Medicines. These may:    ¨ Lower blood pressure.    ¨ Relieve anemia.    ¨ Relieve swelling.    ¨ Protect the bones.  HOME CARE INSTRUCTIONS   · Follow your prescribed diet.    · Only take over-the-counter or prescription medicines as directed by your caregiver.   · Do not take any new medicines (prescription, over-the-counter, or nutritional supplements) unless approved by your caregiver. Many medicines can worsen your kidney damage or need to have the dose adjusted.    · Quit smoking if you are a   Abnormally dark or light skin.   Numbness in the hands or feet.   Easy bruising.   Frequent hiccups.   Menstruation stops.   You have a fever.   You have decreased urine production.   You havepain or bleeding when urinating. MAKE SURE YOU:  Understand these instructions.  Will watch your condition.  Will get help right  away if you are not doing well or get worse. FOR MORE INFORMATION  American Association of Kidney Patients: BombTimer.gl National Kidney Foundation: www.kidney.Conrad: https://mathis.com/ [facility]: www.lifeoptions.org and www.kidneyschool.org Document Released: 04/12/2008 Document Revised: 06/20/2012 Document Reviewed: 03/02/2012 [facility] Patient Information 2015 [place], [place]. This information is not intended to replace advice given to you by your health care provider. Make sure you discuss any questions you have with your health care provider.  Glucose, Blood Sugar, Fasting Blood Sugar This is a test to measure your blood sugar. Glucose is a simple sugar that serves as the main source of energy for the body. The carbohydrates we eat are broken down into glucose (and a few other simple sugars), absorbed by the small intestine, and circulated throughout the body. Most of the body's cells require glucose for energy production; brain and nervous system cells not only rely on glucose for energy, they can only function when glucose levels in the blood remain above a certain level.  The body's use of glucose hinges on the availability of insulin, a hormone produced by the pancreas. Insulin acts as a Control and instrumentation engineer, transporting glucose into the body's cells, directing the body to store excess glucose as glycogen (for short-term storage) and/or as triglycerides in fat cells. We can not live without glucose or insulin, and they must be in balance.  Normally, blood glucose levels rise slightly after a meal, and insulin is secreted to lower them, with the amount of insulin released matched up with the size and content of the meal. If blood glucose levels drop too low, such as might occur in between meals or after a strenuous workout, glucagon (another pancreatic hormone) is secreted to tell the liver to turn some glycogen back into glucose, raising the blood glucose  levels. If the glucose/insulin feedback mechanism is working properly, the amount of glucose in the blood remains fairly stable. If the balance is disrupted and glucose levels in the blood rise, then the body tries to restore the balance, both by increasing insulin production and by excreting glucose in the urine.  PREPARATION FOR TEST A blood sample drawn from a vein in your arm or, for a self check, a drop of blood from a skin prick; in general, it may be recommended that you fast before having a blood glucose test; sometimes a random (no preparation) urine sample is used. Your caregiver will instruct you as to what they want prior to your testing. NORMAL FINDINGS Normal values depend on many factors. Your lab will provide a range of normal values with your test results. The following information summarizes the meaning of the test results. These are based on the clinical practice recommendations of the American Diabetes Association.  FASTING BLOOD GLUCOSE  From 70 to 99 mg/dL (3.9 to 5.5 mmol/L): Normal glucose tolerance  From 100 to 125 mg/dL (5.6 to 6.9 mmol/L):Impaired fasting glucose (pre-diabetes)  126 mg/dL (7.0 mmol/L) and above on more than one testing occasion: Diabetes ORAL GLUCOSE TOLERANCE TEST (OGTT) [EXCEPT PREGNANCY] (2 HOURS AFTER A 75-GRAM GLUCOSE DRINK)  Less than 140 mg/dL (7.8 mmol/L): Normal glucose  tolerance  From 140 to 200 mg/dL (7.8 to 11.1 mmol/L): Impaired glucose tolerance (pre-diabetes)  Over 200 mg/dL (11.1 mmol/L) on more than one testing occasion: Diabetes GESTATIONAL DIABETES SCREENING: GLUCOSE CHALLENGE TEST (1 HOUR AFTER A 50-GRAM GLUCOSE DRINK)  Less than 140* mg/dL (7.8 mmol/L): Normal glucose tolerance  140* mg/dL (7.8 mmol/L) and over: Abnormal, needs OGTT (see below) * Some use a cutoff of More Than 130 mg/dL (7.2 mmol/L) because that identifies 90% of women with gestational diabetes, compared to 80% identified using the threshold of More Than 140  mg/dL (7.8 mmol/L). GESTATIONAL DIABETES DIAGNOSTIC: OGTT (100-GRAM GLUCOSE DRINK)  Fasting*..........................................95 mg/dL (5.3 mmol/L)  1 hour after glucose load*..............180 mg/dL (10.0 mmol/L)  2 hours after glucose load*.............155 mg/dL (8.6 mmol/L)  3 hours after glucose load* **.........140 mg/dL (7.8 mmol/L) * If two or more values are above the criteria, gestational diabetes is diagnosed. ** A 75-gram glucose load may be used, although this method is not as well validated as the 100-gram OGTT; the 3-hour sample is not drawn if 75 grams is used.  Ranges for normal findings may vary among different laboratories and hospitals. You should always check with your doctor after having lab work or other tests done to discuss the meaning of your test results and whether your values are considered within normal limits. MEANING OF TEST  Your caregiver will go over the test results with you and discuss the importance and meaning of your results, as well as treatment options and the need for additional tests if necessary. OBTAINING THE TEST RESULTS It is your responsibility to obtain your test results. Ask the lab or department performing the test when and how you will get your results. Document Released: 08/05/2004 Document Revised: 09/26/2011 Document Reviewed: 06/14/2008 [facility] Patient Information 2015 [place], [place]. This information is not intended to replace advice given to you by your health care provider. Make sure you discuss any questions you have with your health care provider.

## 2014-02-05 NOTE — ED Provider Notes (Signed)
CSN: [id number]     Arrival date & time 02/05/14  1315 History   First MD Initiated Contact with Patient 02/05/14 1328     Chief Complaint  Patient presents with  . Dehydration   HPI Pt went to his doctor's office yesterday for a routine visit.  He told his doctor that he had been drinking a lot of soda and sweet tea and had not been following his diet.  He was called today and was told that his sugar was high and he was dehydrated and that he needed to come to the ED.  Yesterday his sugar was in the 500s.  He checked his sugar today and it was in the 300s.  His doctor still wanted him to come to the ED today. No vomiting or diarrhea.  He has neem eating and drinking fine.  No chest pain or abdominal pain.  Pt states he feels fine. Past Medical History  Diagnosis Date  . Ischemic cardiomyopathy   . Type II diabetes mellitus   . Hypertension   . Hypercholesterolemia   . Heart murmur   . Shortness of breath     "all the time" (06/06/2012)  . Lower GI bleeding 1980's    "when I was drinking alot" (06/06/2012)  . Arthritis     "both shoulders" (06/06/2012)  . Chronic combined systolic and diastolic CHF (congestive heart failure) 07/10/2012  . GERD (gastroesophageal reflux disease) 07/12/2012  . OSA on CPAP     uses a cpap  . Myocardial infarction 11/13  . Coronary artery disease   . Lymphoma   . History of alcoholism   . Sleep apnea   . Aortic stenosis   . Pancreatitis   . Diverticulosis   . Pneumonia   . Personal history of rectal adenoma 05/28/2013   Past Surgical History  Procedure Laterality Date  . Coronary angioplasty with stent placement  06/06/2012    "1"  . Lymph gland excision  07/13/2012    Procedure: CERVICAL LYMPH GLAND EXCISION;  Surgeon: Haywood Lasso, MD;  Location: [place];  Service: General;  Laterality: Right;  . Portacath placement  07/30/2012    Procedure: INSERTION PORT-A-CATH;  Surgeon: Haywood Lasso, MD;  Location: [place];   Service: General;  Laterality: Right;  . Colonoscopy N/A 05/28/2013    Procedure: COLONOSCOPY;  Surgeon: Gatha Mayer, MD;  Location: [facility];  Service: Endoscopy;  Laterality: N/A;   Family History  Problem Relation Age of Onset  . Osteoarthritis Mother   . Osteoarthritis Father   . Colon cancer Daughter   . Diabetes Father     entire family  . Heart disease Father    History  Substance Use Topics  . Smoking status: Former Smoker -- 1.00 packs/day for 53 years    Types: Cigarettes    Quit date: 07/19/2011  . Smokeless tobacco: Never Used     Comment: pt states he does not smoke as long as he uses patch  . Alcohol Use: No     Comment: 06/06/2012 "haven't had a drink in 22 years; I'm an alcoholic"    Review of Systems  All other systems reviewed and are negative.     Allergies  Other  Home Medications   Prior to Admission medications   Medication Sig Start Date End Date Taking? Authorizing Provider  albuterol (PROVENTIL HFA;VENTOLIN HFA) 108 (90 BASE) MCG/ACT inhaler Inhale 2 puffs into the lungs every 6 (six) hours as needed for wheezing  or shortness of breath. 02/04/14  Yes Biagio Borg, MD  AMBULATORY NON FORMULARY MEDICATION Home O2 @@ 2 LMP for 8 hrs at night   Yes Historical Provider, MD  AMBULATORY NON FORMULARY MEDICATION CPAP   Yes Historical Provider, MD  aspirin 81 MG EC tablet Take 1 tablet (81 mg total) by mouth daily. STOP NOW AND RESTART ON 06/06/2013 05/28/13  Yes Gatha Mayer, MD  atorvastatin (LIPITOR) 20 MG tablet Take 1 tablet (20 mg total) by mouth daily at 6 PM. 09/16/13  Yes Josue Hector, MD  carvedilol (COREG) 12.5 MG tablet Take 1 tablet (12.5 mg total) by mouth 2 (two) times daily. 02/25/13  Yes Thayer Headings, MD  glipiZIDE (GLUCOTROL) 10 MG tablet Take 1 tablet (10 mg total) by mouth 2 (two) times daily before a meal. 09/09/13  Yes Biagio Borg, MD  lidocaine-prilocaine (EMLA) cream Apply 1 application topically as needed. 12/11/13  Yes  [name], MD  lisinopril (PRINIVIL,ZESTRIL) 10 MG tablet Take 1 tablet (10 mg total) by mouth daily. 05/09/13  Yes Thayer Headings, MD  nicotine (NICODERM CQ - DOSED IN MG/24 HOURS) 21 mg/24hr patch Place 21 mg onto the skin daily as needed (smoking cessation.).   Yes Historical Provider, MD  potassium chloride (K-DUR) 10 MEQ tablet Take 1 tablet (10 mEq total) by mouth daily. 09/16/13  Yes Josue Hector, MD  sitaGLIPtin (JANUVIA) 100 MG tablet Take 1 tablet (100 mg total) by mouth daily. 06/26/13  Yes Biagio Borg, MD  tiotropium (SPIRIVA) 18 MCG inhalation capsule Place 1 capsule (18 mcg total) into inhaler and inhale daily. 03/14/13  Yes Kathee Delton, MD  torsemide (DEMADEX) 20 MG tablet Take 20 mg by mouth 2 (two) times daily.   Yes Historical Provider, MD   BP 106/56  Pulse 81  Temp(Src) 97.4 F (36.3 C) (Oral)  Resp 16  SpO2 95% Physical Exam  Nursing note and vitals reviewed. Constitutional: He appears well-developed and well-nourished. No distress.  Obese   HENT:  Head: Normocephalic and atraumatic.  Right Ear: External ear normal.  Left Ear: External ear normal.  Eyes: Conjunctivae are normal. Right eye exhibits no discharge. Left eye exhibits no discharge. No scleral icterus.  Neck: Neck supple. No tracheal deviation present.  Cardiovascular: Normal rate, regular rhythm and intact distal pulses.   Pulmonary/Chest: Effort normal and breath sounds normal. No stridor. No respiratory distress. He has no wheezes. He has no rales.  Abdominal: Soft. Bowel sounds are normal. He exhibits no distension. There is no tenderness. There is no rebound and no guarding.  Musculoskeletal: He exhibits no edema and no tenderness.  Neurological: He is alert. He has normal strength. No cranial nerve deficit (no facial droop, extraocular movements intact, no slurred speech) or sensory deficit. He exhibits normal muscle tone. He displays no seizure activity. Coordination normal.  Skin: Skin is  warm and dry. No rash noted.  Psychiatric: He has a normal mood and affect.    ED Course  Procedures (including critical care time) Labs Review Labs Reviewed  CBC WITH DIFFERENTIAL - Abnormal; Notable for the following:    RBC 3.91 (*)    Hemoglobin 10.9 (*)    HCT 32.9 (*)    Platelets 143 (*)    All other components within normal limits  COMPREHENSIVE METABOLIC PANEL - Abnormal; Notable for the following:    Sodium 132 (*)    Chloride 94 (*)    Glucose, Bld 463 (*)  BUN 58 (*)    Creatinine, Ser 2.44 (*)    GFR calc non Af Amer 26 (*)    GFR calc Af Amer 30 (*)    All other components within normal limits  CBG MONITORING, ED - Abnormal; Notable for the following:    Glucose-Capillary 388 (*)    All other components within normal limits  CBG MONITORING, ED - Abnormal; Notable for the following:    Glucose-Capillary 393 (*)    All other components within normal limits  URINALYSIS, ROUTINE W REFLEX MICROSCOPIC    Imaging Review Dg Chest 2 View  02/04/2014   CLINICAL DATA:  68 year old male with shortness of breath. Follow-up pneumonia.  EXAM: CHEST  2 VIEW  COMPARISON:  10/02/2013 CT.  12/21/2012 and prior chest radiographs  FINDINGS: The cardiomediastinal silhouette is unremarkable.  A right Port-A-Cath with tip overlying the lower SVC again noted.  There is no evidence of focal airspace disease, pulmonary edema, suspicious pulmonary nodule/mass, pleural effusion, or pneumothorax.  No acute bony abnormalities are identified.  IMPRESSION: No active cardiopulmonary disease.   Electronically Signed   By: Hassan Rowan M.D.   On: 02/04/2014 13:33    Medications  sodium chloride 0.9 % injection (not administered)  insulin regular (NOVOLIN R,HUMULIN R) 1 Units/mL in sodium chloride 0.9 % 100 mL infusion (not administered)  sodium chloride 0.9 % bolus 1,000 mL (0 mLs Intravenous Stopped 02/05/14 1609)    MDM   Final diagnoses:  Hyperglycemia  Acute on chronic renal insufficiency     Pt is feeling well in the ED.  He has been non compliant with his diet and exercise recently and thinks this is contributing.  Will give IV fluids, and insulin in the ED.  Pt does not want to be admitted.  His renal function has improved since yesterday.  Will recheck labs and blood sugar.  Anticipate discharge once those are back.  He does not want to be admitted to the hospital.  He has plans to see PCP on Friday to review his diabetes medications and his blood sugar.    Dorie Rank, MD 02/05/14 [phone]

## 2014-02-05 NOTE — ED Provider Notes (Signed)
6:04 PM Care accepted from Dr. Tomi Bamberger. 32M here w/ what appears to be CKD and hyperglycemia. No elev in A gap. Well appearing. BS dec approp. Pt would like to go and has f/u.   6:05 PM:  I have discussed the diagnosis/risks/treatment options with the patient and believe the pt to be eligible for discharge home to follow-up with his pcp. We also discussed returning to the ED immediately if new or worsening sx occur. We discussed the sx which are most concerning (e.g., BS >500, vomiting, fever, abd pain, dec urine output) that necessitate immediate return. Medications administered to the patient during their visit and any new prescriptions provided to the patient are listed below.  Medications given during this visit Medications  sodium chloride 0.9 % injection (not administered)  insulin regular (NOVOLIN R,HUMULIN R) 1 Units/mL in sodium chloride 0.9 % 100 mL infusion (6 Units/hr Intravenous Rate/Dose Change 02/05/14 1729)  sodium chloride 0.9 % bolus 1,000 mL (0 mLs Intravenous Stopped 02/05/14 1609)    New Prescriptions   No medications on file   Clinical Impression 1. Hyperglycemia   2. Acute on chronic renal insufficiency      Blanchard Kelch, MD 02/05/14 1806

## 2014-02-05 NOTE — ED Notes (Signed)
Pt made aware of need for urine specimen 

## 2014-02-05 NOTE — Telephone Encounter (Signed)
MD has addressed these results.

## 2014-02-05 NOTE — ED Notes (Addendum)
Pt went to PCP and told he needed fluids, told his kidneys was failing. Pt c/o fatigue not being able to get enough sleep.

## 2014-02-05 NOTE — ED Notes (Signed)
Pt reminded of need for urine 

## 2014-02-07 ENCOUNTER — Ambulatory Visit (INDEPENDENT_AMBULATORY_CARE_PROVIDER_SITE_OTHER): Payer: Commercial Managed Care - HMO | Admitting: Family Medicine

## 2014-02-07 ENCOUNTER — Encounter: Payer: Self-pay | Admitting: Family Medicine

## 2014-02-07 ENCOUNTER — Telehealth: Payer: Self-pay | Admitting: Internal Medicine

## 2014-02-07 VITALS — BP 108/66 | HR 92 | Ht 75.0 in | Wt 300.0 lb

## 2014-02-07 DIAGNOSIS — M12812 Other specific arthropathies, not elsewhere classified, left shoulder: Principal | ICD-10-CM

## 2014-02-07 DIAGNOSIS — M75102 Unspecified rotator cuff tear or rupture of left shoulder, not specified as traumatic: Principal | ICD-10-CM

## 2014-02-07 DIAGNOSIS — M75101 Unspecified rotator cuff tear or rupture of right shoulder, not specified as traumatic: Principal | ICD-10-CM

## 2014-02-07 DIAGNOSIS — M12811 Other specific arthropathies, not elsewhere classified, right shoulder: Secondary | ICD-10-CM

## 2014-02-07 DIAGNOSIS — M19019 Primary osteoarthritis, unspecified shoulder: Secondary | ICD-10-CM

## 2014-02-07 MED ORDER — GLUCOSE BLOOD VI STRP: ORAL_STRIP | Status: DC

## 2014-02-07 MED ORDER — ACCU-CHEK AVIVA PLUS W/DEVICE KIT: PACK | Status: AC

## 2014-02-07 NOTE — Patient Instructions (Signed)
Good to see you We cannot do an injection until sugars are under 250.  Ice is your friend.  Continue the exercises 3 times a week if you can.  Vitamin D 2000 IU daily can help som eof the aches and pain.  See me when you get those sugar down.

## 2014-02-07 NOTE — Assessment & Plan Note (Signed)
Patient does have severe rotator cuff arthropathy of both shoulders. We discussed the pacing as well as over-the-counter medications to be beneficial. Unable to do anti-inflammatories secondary to his chronic renal insufficiency. Unable to do topical anti-inflammatories due to his creatinine being near 3 on last lab values. Patient also has congestive heart failure and would not want to cause worsening swelling. I wouldn't do intra-articular injections but would patient sugars and recent hospitalization did not feel comfortable at this time. Discuss with him that we need to get sugars down to his less than 250 before I would feel comfortable. If we did do injections then I would want him to call daily for the next 3 days after the injection to give me his lab values in case we need to make adjustments or if he needs critical-care at any time. Patient was very understanding. We discussed vitamin D supplementation and have this could be helpful. Patient will come back when he has his blood glucose values under control.

## 2014-02-07 NOTE — Telephone Encounter (Signed)
Patient is requesting a test kit for diabetes to be ordered.

## 2014-02-07 NOTE — Progress Notes (Signed)
  Subjective:    CC: Followup of bilateral shoulder pain  HPI: Patient is a 68 year old male with severe Oster arthritic changes of the shoulders bilaterally. At last visit patient was given to intra-articular steroid injections under ultrasound guidance as well as sent to formal physical therapy. Patient actually didn't do very well for quite some time. Less than patient was seen was 9 months ago.  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 Patient states she continues to have some pain. It has slowly worsened again over the course of time.  Past medical history, Surgical history, Family history not pertinant except as noted below, Social history, Allergies, and medications have been entered into the medical record, reviewed, and no changes needed.   Review of Systems: No fevers, chills, night sweats, weight loss, chest pain, or shortness of breath.   Objective:   Blood pressure 108/66, pulse 92, height 6\' 3"  (1.905 m), weight 300 lb (136.079 kg), SpO2 97.00%.  General: No apparent distress alert and oriented x3 mood and affect normal, dressed appropriately. obese  HEENT: Pupils equal, extraocular movements intact  Respiratory: Patient's speak in full sentences and does not appear short of breath  Cardiovascular: No lower extremity edema, non tender, no erythema  Skin: Warm dry intact with no signs of infection or rash on extremities or on axial skeleton.  Abdomen: Soft nontender  Neuro: Cranial nerves II through XII are intact, neurovascularly intact in all extremities with 2+ DTRs and 2+ pulses.  Lymph: No lymphadenopathy of posterior or anterior cervical chain or axillae bilaterally.  Gait normal with good balance and coordination.  MSK: Non tender with full range of motion and good stability and symmetric strength and tone of elbows, wrist, hip, knee and ankles bilaterally.  Shoulder: bilateral  Inspection reveals no abnormalities, atrophy or asymmetry.  Patient has  generalized tenderness to palpation of the shoulder bilaterally Patient has severe limited but symmetric decreased range of motion bilaterally  Patient has external rotation to 10, internal rotation to hip, abduction to 90 bilaterally. Flexion to 100 on right and 80 on left Rotator cuff strength is weakened bilaterally to  3+/5 on right and 3/5 on left I do think it is intact.  Patient does have positive severe crepitus with range of motion.  Speeds and Yergason's tests normal.  Normal scapular function observed.  No painful arc and no drop arm sign.  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 joint.    Impression and Recommendations:

## 2014-02-18 ENCOUNTER — Ambulatory Visit (HOSPITAL_BASED_OUTPATIENT_CLINIC_OR_DEPARTMENT_OTHER): Payer: Commercial Managed Care - HMO | Admitting: Oncology

## 2014-02-18 ENCOUNTER — Telehealth: Payer: Self-pay | Admitting: Oncology

## 2014-02-18 ENCOUNTER — Ambulatory Visit: Payer: Medicare HMO

## 2014-02-18 ENCOUNTER — Other Ambulatory Visit (HOSPITAL_BASED_OUTPATIENT_CLINIC_OR_DEPARTMENT_OTHER): Payer: Commercial Managed Care - HMO

## 2014-02-18 VITALS — BP 135/57 | HR 83 | Temp 97.4°F | Resp 18 | Ht 75.0 in | Wt 300.3 lb

## 2014-02-18 DIAGNOSIS — R0602 Shortness of breath: Secondary | ICD-10-CM | POA: Diagnosis not present

## 2014-02-18 DIAGNOSIS — C9110 Chronic lymphocytic leukemia of B-cell type not having achieved remission: Secondary | ICD-10-CM | POA: Diagnosis not present

## 2014-02-18 DIAGNOSIS — Z95828 Presence of other vascular implants and grafts: Secondary | ICD-10-CM

## 2014-02-18 DIAGNOSIS — C8590 Non-Hodgkin lymphoma, unspecified, unspecified site: Secondary | ICD-10-CM

## 2014-02-18 LAB — CBC WITH DIFFERENTIAL/PLATELET
BASO%: 0.6 % (ref 0.0–2.0)
BASOS ABS: 0 10*3/uL (ref 0.0–0.1)
EOS%: 3.8 % (ref 0.0–7.0)
Eosinophils Absolute: 0.2 10*3/uL (ref 0.0–0.5)
HCT: 36 % — ABNORMAL LOW (ref 38.4–49.9)
HEMOGLOBIN: 11.6 g/dL — AB (ref 13.0–17.1)
LYMPH%: 27.8 % (ref 14.0–49.0)
MCH: 27.9 pg (ref 27.2–33.4)
MCHC: 32.3 g/dL (ref 32.0–36.0)
MCV: 86.4 fL (ref 79.3–98.0)
MONO#: 0.5 10*3/uL (ref 0.1–0.9)
MONO%: 10 % (ref 0.0–14.0)
NEUT#: 3.1 10*3/uL (ref 1.5–6.5)
NEUT%: 57.8 % (ref 39.0–75.0)
Platelets: 196 10*3/uL (ref 140–400)
RBC: 4.16 10*6/uL — ABNORMAL LOW (ref 4.20–5.82)
RDW: 14.2 % (ref 11.0–14.6)
WBC: 5.4 10*3/uL (ref 4.0–10.3)
lymph#: 1.5 10*3/uL (ref 0.9–3.3)

## 2014-02-18 LAB — COMPREHENSIVE METABOLIC PANEL (CC13)
ALT: 32 U/L (ref 0–55)
AST: 24 U/L (ref 5–34)
Albumin: 3.8 g/dL (ref 3.5–5.0)
Alkaline Phosphatase: 76 U/L (ref 40–150)
Anion Gap: 12 mEq/L — ABNORMAL HIGH (ref 3–11)
BUN: 32.5 mg/dL — AB (ref 7.0–26.0)
CALCIUM: 9.3 mg/dL (ref 8.4–10.4)
CHLORIDE: 103 meq/L (ref 98–109)
CO2: 21 meq/L — AB (ref 22–29)
Creatinine: 1.9 mg/dL — ABNORMAL HIGH (ref 0.7–1.3)
Glucose: 346 mg/dl — ABNORMAL HIGH (ref 70–140)
Potassium: 4.2 mEq/L (ref 3.5–5.1)
Sodium: 135 mEq/L — ABNORMAL LOW (ref 136–145)
Total Bilirubin: 0.3 mg/dL (ref 0.20–1.20)
Total Protein: 7.2 g/dL (ref 6.4–8.3)

## 2014-02-18 MED ORDER — HEPARIN SOD (PORK) LOCK FLUSH 100 UNIT/ML IV SOLN
500.0000 [IU] | Freq: Once | INTRAVENOUS | Status: DC
  Filled 2014-02-18: qty 5

## 2014-02-18 MED ORDER — SODIUM CHLORIDE 0.9 % IJ SOLN
10.0000 mL | INTRAMUSCULAR | Status: DC | PRN
  Filled 2014-02-18: qty 10

## 2014-02-18 NOTE — Progress Notes (Signed)
Hematology and Oncology Follow Up Visit  [name] [name] [id number] 04-21-1946 68 y.o. 02/18/2014 9:40 AM   Principle Diagnosis: 68 year old with stage IIIA SLL diagnosed in 06/2012. He presented with abdominal pain and diffuse lymphadenopathy.   Prior Therapy:  He is S/P lymph node biopsy that was done on 07/13/2012.  He is S/P 6 cycles of B-R started in 07/2012. He completed cycle 6 on 12/2012.    Current therapy: Observation and follow up.   Interim History: [name] [name] presents today for a follow up visit by him self. Since his last visit, he has been doing well. He was seen in the ED on 7/22 for hyperglycemia. This has resolved and he is trying to lose weight. He is not reporting any abdominal pain or early satiety. He is not reporting any nausea or vomiting is not reporting any fevers or chills. No evidence to suggest relapsed disease. His appetite is reasonable his continued to gain weight. He has not reported any lymphadenopathy or petechiae. He has not reported any frequency urgency or hesitancy. Does not report any constitutional symptoms. He does not report any headaches or blurry vision or syncope. Rest of his review of systems unremarkable  Medications: I have reviewed the patient's current medications. Current Outpatient Prescriptions  Medication Sig Dispense Refill  . albuterol (PROVENTIL HFA;VENTOLIN HFA) 108 (90 BASE) MCG/ACT inhaler Inhale 2 puffs into the lungs every 6 (six) hours as needed for wheezing or shortness of breath.  1 Inhaler  11  . AMBULATORY NON FORMULARY MEDICATION Home O2 @@ 2 LMP for 8 hrs at night      . AMBULATORY NON FORMULARY MEDICATION CPAP      . aspirin 81 MG EC tablet Take 1 tablet (81 mg total) by mouth daily. STOP NOW AND RESTART ON 06/06/2013  30 tablet  12  . atorvastatin (LIPITOR) 20 MG tablet Take 1 tablet (20 mg total) by mouth daily at 6 PM.  90 tablet  1  . Blood Glucose Monitoring Suppl (ACCU-CHEK AVIVA PLUS) W/DEVICE KIT Use to check blood  sugars twice a day Dx 250.00  1 kit  0  . carvedilol (COREG) 12.5 MG tablet Take 1 tablet (12.5 mg total) by mouth 2 (two) times daily.  180 tablet  3  . glipiZIDE (GLUCOTROL) 10 MG tablet Take 1 tablet (10 mg total) by mouth 2 (two) times daily before a meal.  180 tablet  3  . Glucosamine-Chondroitin 250-200 MG TABS Take by mouth.      Marland Kitchen glucose blood (ACCU-CHEK AVIVA PLUS) test strip Use to check blood sugars twice a day Dx 250.00  60 each  11  . KLOR-CON M10 10 MEQ tablet Take 10 mEq by mouth daily.      Marland Kitchen lidocaine-prilocaine (EMLA) cream Apply 1 application topically as needed.  30 g  3  . lisinopril (PRINIVIL,ZESTRIL) 10 MG tablet Take 1 tablet (10 mg total) by mouth daily.  30 tablet  11  . nicotine (NICODERM CQ - DOSED IN MG/24 HOURS) 21 mg/24hr patch Place 21 mg onto the skin daily as needed (smoking cessation.).      Marland Kitchen potassium chloride (K-DUR) 10 MEQ tablet Take 1 tablet (10 mEq total) by mouth daily.  90 tablet  1  . sitaGLIPtin (JANUVIA) 100 MG tablet Take 1 tablet (100 mg total) by mouth daily.  30 tablet  11  . tiotropium (SPIRIVA) 18 MCG inhalation capsule Place 1 capsule (18 mcg total) into inhaler and inhale daily.  [place]  capsule  6  . torsemide (DEMADEX) 20 MG tablet Take 20 mg by mouth 2 (two) times daily.       No current facility-administered medications for this visit.   Facility-Administered Medications Ordered in Other Visits  Medication Dose Route Frequency Provider Last Rate Last Dose  . heparin lock flush 100 unit/mL  500 Units Intravenous Once [name], MD      . sodium chloride 0.9 % injection 10 mL  10 mL Intravenous PRN [name], MD   10 mL at 04/25/13 1100  . sodium chloride 0.9 % injection 10 mL  10 mL Intravenous PRN [name], MD         Allergies:  Allergies  Allergen Reactions  . Other Hives    Poison ivy.     Past Medical History, Surgical history, Social history, and Family History were reviewed and updated.    Physical  Exam: Blood pressure 135/57, pulse 83, temperature 97.4 F (36.3 C), temperature source Oral, resp. rate 18, height $RemoveBe'6\' 3"'tmIvhUvQM$  (1.905 m), weight 300 lb 4.8 oz (136.215 kg), SpO2 96.00%. ECOG: 1 General appearance: alert Head: Normocephalic, without obvious abnormality Neck: no adenopathy Lymph nodes: Cervical, supraclavicular, and axillary nodes normal. Heart:regular rate and rhythm, S1, S2 normal, no murmur, click, rub or gallop Lung:chest clear, no wheezing, rales, normal symmetric air entry Abdomen: soft, non-tender, without masses or organomegaly EXT:no erythema, induration, or nodules No lymph nodes noted on exam today.   Lab Results: Lab Results  Component Value Date   WBC 5.4 02/18/2014   HGB 11.6* 02/18/2014   HCT 36.0* 02/18/2014   MCV 86.4 02/18/2014   PLT 196 02/18/2014     Chemistry      Component Value Date/Time   NA 132* 02/05/2014 1445   NA 134* 10/02/2013 1302   K 5.3 02/05/2014 1445   K 5.3* 10/02/2013 1302   CL 94* 02/05/2014 1445   CL 106 12/19/2012 0832   CO2 23 02/05/2014 1445   CO2 24 10/02/2013 1302   BUN 58* 02/05/2014 1445   BUN 49.0* 10/02/2013 1302   CREATININE 2.44* 02/05/2014 1445   CREATININE 2.3* 10/02/2013 1302      Component Value Date/Time   CALCIUM 9.5 02/05/2014 1445   CALCIUM 9.6 10/02/2013 1302   ALKPHOS 98 02/05/2014 1445   ALKPHOS 86 10/02/2013 1302   AST 23 02/05/2014 1445   AST 13 10/02/2013 1302   ALT 31 02/05/2014 1445   ALT 24 10/02/2013 1302   BILITOT 0.3 02/05/2014 1445   BILITOT 0.44 10/02/2013 1302      Impression and Plan:  This is a 68 year old gentleman with the following issues: 1. Chronic lymphocytic leukemia/small lymphocytic lymphoma. He is S/P B-R chemotherapy. He completed chemotherapy without complications. CT on 10/02/2013 complete response to therapy.  We will continue observation and surveillance and certainly initiate chemotherapy again upon the relapse. I will repeat his CT scan in 08/2014.  2. Abdominal pain: This have resolved at  this time.   3. Shortness of breath:  this has improved at this time. He is trying to lose weight which is helping.   4. Follow up: In 6 months after a scan. He will continue port flush every 2 months.       TKWIOX,BDZHG 8/4/20159:40 AM

## 2014-02-18 NOTE — Telephone Encounter (Signed)
Pt confirmed labs/ov per 08/03 POF, gave pt AVS...KJ °

## 2014-02-18 NOTE — Progress Notes (Signed)
Patient in for Port-A-Cath flush and labs today. Patient states, "I just had my port flushed about two weeks ago when I was in the hospital . I want the blood taken from my arm or hand." Phlebotomist agreed to draw all labs today.

## 2014-02-18 NOTE — Patient Instructions (Signed)

## 2014-02-21 ENCOUNTER — Encounter: Payer: Self-pay | Admitting: Internal Medicine

## 2014-02-21 ENCOUNTER — Ambulatory Visit (INDEPENDENT_AMBULATORY_CARE_PROVIDER_SITE_OTHER): Payer: Commercial Managed Care - HMO | Admitting: Internal Medicine

## 2014-02-21 VITALS — BP 102/62 | HR 82 | Temp 98.3°F | Wt 302.0 lb

## 2014-02-21 DIAGNOSIS — E785 Hyperlipidemia, unspecified: Secondary | ICD-10-CM

## 2014-02-21 DIAGNOSIS — E1165 Type 2 diabetes mellitus with hyperglycemia: Secondary | ICD-10-CM

## 2014-02-21 DIAGNOSIS — I10 Essential (primary) hypertension: Secondary | ICD-10-CM

## 2014-02-21 DIAGNOSIS — E119 Type 2 diabetes mellitus without complications: Secondary | ICD-10-CM

## 2014-02-21 MED ORDER — INSULIN GLARGINE 100 UNIT/ML SOLOSTAR PEN: 10.0000 [IU] | PEN_INJECTOR | Freq: Every day | SUBCUTANEOUS | Status: DC

## 2014-02-21 MED ORDER — INSULIN PEN NEEDLE 32G X 4 MM MISC: Status: AC

## 2014-02-21 NOTE — Progress Notes (Signed)
Pre visit review using our clinic review tool, if applicable. No additional management support is needed unless otherwise documented below in the visit note. 

## 2014-02-21 NOTE — Progress Notes (Signed)
Subjective:    Patient ID: [name] [name], male    DOB: November 05, 1945, 68 y.o.   MRN: [id number]  HPI  Here to f/u; overall doing ok,  Pt denies chest pain, increased sob or doe, wheezing, orthopnea, PND, increased LE swelling, palpitations, dizziness or syncope.  Pt denies polydipsia, polyuria, or low sugar symptoms such as weakness or confusion improved with po intake.  Pt denies new neurological symptoms such as new headache, or facial or extremity weakness or numbness.   Pt states overall good compliance with meds, has been really trying to follow lower cholesterol, diabetic diet, with wt overall stable,  but little exercise however.  Cut out most of his sugar drinks.  Has been seeing sport med, cannot do injection due to elev BS.  Saw heme/onc overall stable it seems.   CBG's most often in the lower 300's this past wk, but 281 today. Really working on better diet on sam po meds as last visit.  Very resistant to starting insulin today.Admits to poor diet and 40-50 lbs wt gain in last 6 mo, just gave up on diet.  Albuterol working well prn use. Celesta Gentile is expensive. Past Medical History  Diagnosis Date  . Ischemic cardiomyopathy   . Type II diabetes mellitus   . Hypertension   . Hypercholesterolemia   . Heart murmur   . Shortness of breath     "all the time" (06/06/2012)  . Lower GI bleeding 1980's    "when I was drinking alot" (06/06/2012)  . Arthritis     "both shoulders" (06/06/2012)  . Chronic combined systolic and diastolic CHF (congestive heart failure) 07/10/2012  . GERD (gastroesophageal reflux disease) 07/12/2012  . OSA on CPAP     uses a cpap  . Myocardial infarction 11/13  . Coronary artery disease   . Lymphoma   . History of alcoholism   . Sleep apnea   . Aortic stenosis   . Pancreatitis   . Diverticulosis   . Pneumonia   . Personal history of rectal adenoma 05/28/2013   Past Surgical History  Procedure Laterality Date  . Coronary angioplasty with stent placement   06/06/2012    "1"  . Lymph gland excision  07/13/2012    Procedure: CERVICAL LYMPH GLAND EXCISION;  Surgeon: Haywood Lasso, MD;  Location: [facility];  Service: General;  Laterality: Right;  . Portacath placement  07/30/2012    Procedure: INSERTION PORT-A-CATH;  Surgeon: Haywood Lasso, MD;  Location: [facility];  Service: General;  Laterality: Right;  . Colonoscopy N/A 05/28/2013    Procedure: COLONOSCOPY;  Surgeon: Gatha Mayer, MD;  Location: [facility];  Service: Endoscopy;  Laterality: N/A;    reports that he quit smoking about 2 years ago. His smoking use included Cigarettes. He has a 53 pack-year smoking history. He has never used smokeless tobacco. He reports that he does not drink alcohol or use illicit drugs. family history includes Colon cancer in his daughter; Diabetes in his father; Heart disease in his father; Osteoarthritis in his father and mother. Allergies  Allergen Reactions  . Other Hives    Poison ivy.    Current Outpatient Prescriptions on File Prior to Visit  Medication Sig Dispense Refill  . albuterol (PROVENTIL HFA;VENTOLIN HFA) 108 (90 BASE) MCG/ACT inhaler Inhale 2 puffs into the lungs every 6 (six) hours as needed for wheezing or shortness of breath.  1 Inhaler  11  . AMBULATORY NON FORMULARY MEDICATION Home O2 @@ 2 LMP  for 8 hrs at night      . AMBULATORY NON FORMULARY MEDICATION CPAP      . aspirin 81 MG EC tablet Take 1 tablet (81 mg total) by mouth daily. STOP NOW AND RESTART ON 06/06/2013  30 tablet  12  . atorvastatin (LIPITOR) 20 MG tablet Take 1 tablet (20 mg total) by mouth daily at 6 PM.  90 tablet  1  . Blood Glucose Monitoring Suppl (ACCU-CHEK AVIVA PLUS) W/DEVICE KIT Use to check blood sugars twice a day Dx 250.00  1 kit  0  . carvedilol (COREG) 12.5 MG tablet Take 1 tablet (12.5 mg total) by mouth 2 (two) times daily.  180 tablet  3  . glipiZIDE (GLUCOTROL) 10 MG tablet Take 1 tablet (10 mg total) by mouth 2 (two) times daily  before a meal.  180 tablet  3  . Glucosamine-Chondroitin 250-200 MG TABS Take by mouth.      Marland Kitchen glucose blood (ACCU-CHEK AVIVA PLUS) test strip Use to check blood sugars twice a day Dx 250.00  60 each  11  . KLOR-CON M10 10 MEQ tablet Take 10 mEq by mouth daily.      Marland Kitchen lidocaine-prilocaine (EMLA) cream Apply 1 application topically as needed.  30 g  3  . lisinopril (PRINIVIL,ZESTRIL) 10 MG tablet Take 1 tablet (10 mg total) by mouth daily.  30 tablet  11  . nicotine (NICODERM CQ - DOSED IN MG/24 HOURS) 21 mg/24hr patch Place 21 mg onto the skin daily as needed (smoking cessation.).      Marland Kitchen potassium chloride (K-DUR) 10 MEQ tablet Take 1 tablet (10 mEq total) by mouth daily.  90 tablet  1  . sitaGLIPtin (JANUVIA) 100 MG tablet Take 1 tablet (100 mg total) by mouth daily.  30 tablet  11  . tiotropium (SPIRIVA) 18 MCG inhalation capsule Place 1 capsule (18 mcg total) into inhaler and inhale daily.  30 capsule  6  . torsemide (DEMADEX) 20 MG tablet Take 20 mg by mouth 2 (two) times daily.       Current Facility-Administered Medications on File Prior to Visit  Medication Dose Route Frequency Provider Last Rate Last Dose  . sodium chloride 0.9 % injection 10 mL  10 mL Intravenous PRN [name], MD   10 mL at 04/25/13 1100   Review of Systems  Constitutional: Negative for unusual diaphoresis or other sweats  HENT: Negative for ringing in ear Eyes: Negative for double vision or worsening visual disturbance.  Respiratory: Negative for choking and stridor.   Gastrointestinal: Negative for vomiting or other signifcant bowel change Genitourinary: Negative for hematuria or decreased urine volume.  Musculoskeletal: Negative for other MSK pain or swelling Skin: Negative for color change and worsening wound.  Neurological: Negative for tremors and numbness other than noted  Psychiatric/Behavioral: Negative for decreased concentration or agitation other than above       Objective:   Physical  Exam BP 102/62  Pulse 82  Temp(Src) 98.3 F (36.8 C) (Oral)  Wt 302 lb (136.986 kg)  SpO2 96% VS noted,  Constitutional: Pt appears well-developed, well-nourished.  HENT: Head: NCAT.  Right Ear: External ear normal.  Left Ear: External ear normal.  Eyes: . Pupils are equal, round, and reactive to light. Conjunctivae and EOM are normal Neck: Normal range of motion. Neck supple.  Cardiovascular: Normal rate and regular rhythm.   Pulmonary/Chest: Effort normal and breath sounds normal.  Abd:  Soft, NT, ND, + BS Neurological: Pt  is alert. Not confused , motor grossly intact Skin: Skin is warm. No rash Psychiatric: Pt behavior is normal. No agitation.     Assessment & Plan:

## 2014-02-21 NOTE — Patient Instructions (Signed)
Please take all new medication as prescribed - the Lantus at 10 units per day (in the AM or the PM, just so you do it about the same time each day)  You are given the sample, and instructions today  Please continue to monitor your blood sugars, and call in 2 weeks with the results, as we will probably need to increase the lantus a bit more at that time  Please ask your pharmacist if Lovell or Tradjenta might be less expensive with your insurance, than the Januvia; if so, just call and we can most likely change it to the less expensive  Please continue all other medications as before, and refills have been done if requested.  Please have the pharmacy call with any other refills you may need.  Please continue your efforts at being more active, low cholesterol diabetic diet, and weight control.  Please keep your appointments with your specialists as you may have planned  Please return in 1 month, and please bring your meter to check the readings

## 2014-02-22 NOTE — Assessment & Plan Note (Signed)
Uncontrolled, for start lantus 10 units qd, call with cbg's in 2 wks, f/u rov 4 wks, cont diet and wt control efforts Lab Results  Component Value Date   HGBA1C 18.5* 02/04/2014

## 2014-02-22 NOTE — Assessment & Plan Note (Signed)
stable overall by history and exam, recent data reviewed with pt, and pt to continue medical treatment as before,  to f/u any worsening symptoms or concerns Lab Results  Component Value Date   LDLCALC 51 02/04/2014

## 2014-02-22 NOTE — Assessment & Plan Note (Signed)
stable overall by history and exam, recent data reviewed with pt, and pt to continue medical treatment as before,  to f/u any worsening symptoms or concerns BP Readings from Last 3 Encounters:  02/21/14 102/62  02/18/14 135/57  02/07/14 108/66

## 2014-03-20 ENCOUNTER — Encounter: Payer: Self-pay | Admitting: Internal Medicine

## 2014-03-20 ENCOUNTER — Ambulatory Visit (INDEPENDENT_AMBULATORY_CARE_PROVIDER_SITE_OTHER): Payer: Commercial Managed Care - HMO | Admitting: Internal Medicine

## 2014-03-20 VITALS — BP 122/72 | HR 81 | Temp 98.0°F | Wt 310.0 lb

## 2014-03-20 DIAGNOSIS — N5089 Other specified disorders of the male genital organs: Secondary | ICD-10-CM

## 2014-03-20 DIAGNOSIS — E119 Type 2 diabetes mellitus without complications: Secondary | ICD-10-CM

## 2014-03-20 DIAGNOSIS — I509 Heart failure, unspecified: Secondary | ICD-10-CM

## 2014-03-20 DIAGNOSIS — I5042 Chronic combined systolic (congestive) and diastolic (congestive) heart failure: Secondary | ICD-10-CM

## 2014-03-20 DIAGNOSIS — I10 Essential (primary) hypertension: Secondary | ICD-10-CM

## 2014-03-20 MED ORDER — CIPROFLOXACIN HCL 250 MG PO TABS: 250.0000 mg | ORAL_TABLET | Freq: Two times a day (BID) | ORAL | Status: DC

## 2014-03-20 MED ORDER — TORSEMIDE 20 MG PO TABS: ORAL_TABLET | ORAL | Status: DC

## 2014-03-20 NOTE — Patient Instructions (Signed)
Please take all new medication as prescribed - the antibiotic  OK to increase the torsemide to 40 mg twice per day  Please check your weight at home every morning and write them down  Please call for appt with Dr Acie Fredrickson (as you preferred to call yourself), and take your weights with you  Please continue all other medications as before, and refills have been done if requested.  Please have the pharmacy call with any other refills you may need.  Please keep your appointments with your specialists as you may have planned  You will be contacted regarding the referral for: testicle ultrasound  Please return in 6 months, or sooner if needed

## 2014-03-20 NOTE — Progress Notes (Signed)
Pre visit review using our clinic review tool, if applicable. No additional management support is needed unless otherwise documented below in the visit note. 

## 2014-03-21 ENCOUNTER — Ambulatory Visit
Admission: RE | Admit: 2014-03-21 | Discharge: 2014-03-21 | Disposition: A | Discharge status: Home / Self Care | Payer: Commercial Managed Care - HMO | Source: Ambulatory Visit | Attending: Internal Medicine | Admitting: Internal Medicine

## 2014-03-21 DIAGNOSIS — N5089 Other specified disorders of the male genital organs: Secondary | ICD-10-CM

## 2014-03-22 DIAGNOSIS — N5089 Other specified disorders of the male genital organs: Secondary | ICD-10-CM | POA: Insufficient documentation

## 2014-03-22 NOTE — Assessment & Plan Note (Addendum)
prob orchitis, for cipro courese, testicle u/s, suspect swelling some related to ? Anasarca, for antibx,  to f/u any worsening symptoms or concerns

## 2014-03-22 NOTE — Progress Notes (Signed)
Subjective:    Patient ID: [name] [name], male    DOB: 1946-05-29, 68 y.o.   MRN: [id number]  HPI  Here with c/o 2 days onset right > left testicular swelling, tender without fever, Denies urinary symptoms such as dysuria, frequency, urgency, flank pain, hematuria or n/v, fever, chills.  Pt denies chest pain, increased sob or doe, wheezing, orthopnea, PND, palpitations, dizziness or syncope, but has bilat LE edema today of which he is unaware.  Pt denies new neurological symptoms such as new headache, or facial or extremity weakness or numbness   Pt denies polydipsia, polyuria. Has gained wt of which he is not aware.  CBG's on new insulin now in 150's. Wt Readings from Last 3 Encounters:  03/20/14 310 lb (140.615 kg)  02/21/14 302 lb (136.986 kg)  02/18/14 300 lb 4.8 oz (136.215 kg)    Past Medical History  Diagnosis Date  . Ischemic cardiomyopathy   . Type II diabetes mellitus   . Hypertension   . Hypercholesterolemia   . Heart murmur   . Shortness of breath     "all the time" (06/06/2012)  . Lower GI bleeding 1980's    "when I was drinking alot" (06/06/2012)  . Arthritis     "both shoulders" (06/06/2012)  . Chronic combined systolic and diastolic CHF (congestive heart failure) 07/10/2012  . GERD (gastroesophageal reflux disease) 07/12/2012  . OSA on CPAP     uses a cpap  . Myocardial infarction 11/13  . Coronary artery disease   . Lymphoma   . History of alcoholism   . Sleep apnea   . Aortic stenosis   . Pancreatitis   . Diverticulosis   . Pneumonia   . Personal history of rectal adenoma 05/28/2013   Past Surgical History  Procedure Laterality Date  . Coronary angioplasty with stent placement  06/06/2012    "1"  . Lymph gland excision  07/13/2012    Procedure: CERVICAL LYMPH GLAND EXCISION;  Surgeon: Haywood Lasso, MD;  Location: [facility];  Service: General;  Laterality: Right;  . Portacath placement  07/30/2012    Procedure: INSERTION PORT-A-CATH;  Surgeon:  Haywood Lasso, MD;  Location: [facility];  Service: General;  Laterality: Right;  . Colonoscopy N/A 05/28/2013    Procedure: COLONOSCOPY;  Surgeon: Gatha Mayer, MD;  Location: [facility];  Service: Endoscopy;  Laterality: N/A;    reports that he quit smoking about 2 years ago. His smoking use included Cigarettes. He has a 53 pack-year smoking history. He has never used smokeless tobacco. He reports that he does not drink alcohol or use illicit drugs. family history includes Colon cancer in his daughter; Diabetes in his father; Heart disease in his father; Osteoarthritis in his father and mother. Allergies  Allergen Reactions  . Other Hives    Poison ivy.    Current Outpatient Prescriptions on File Prior to Visit  Medication Sig Dispense Refill  . albuterol (PROVENTIL HFA;VENTOLIN HFA) 108 (90 BASE) MCG/ACT inhaler Inhale 2 puffs into the lungs every 6 (six) hours as needed for wheezing or shortness of breath.  1 Inhaler  11  . AMBULATORY NON FORMULARY MEDICATION Home O2 @@ 2 LMP for 8 hrs at night      . AMBULATORY NON FORMULARY MEDICATION CPAP      . aspirin 81 MG EC tablet Take 1 tablet (81 mg total) by mouth daily. STOP NOW AND RESTART ON 06/06/2013  30 tablet  12  . atorvastatin (LIPITOR)  20 MG tablet Take 1 tablet (20 mg total) by mouth daily at 6 PM.  90 tablet  1  . Blood Glucose Monitoring Suppl (ACCU-CHEK AVIVA PLUS) W/DEVICE KIT Use to check blood sugars twice a day Dx 250.00  1 kit  0  . carvedilol (COREG) 12.5 MG tablet Take 1 tablet (12.5 mg total) by mouth 2 (two) times daily.  180 tablet  3  . glipiZIDE (GLUCOTROL) 10 MG tablet Take 1 tablet (10 mg total) by mouth 2 (two) times daily before a meal.  180 tablet  3  . Glucosamine-Chondroitin 250-200 MG TABS Take by mouth.      Marland Kitchen glucose blood (ACCU-CHEK AVIVA PLUS) test strip Use to check blood sugars twice a day Dx 250.00  60 each  11  . Insulin Glargine (LANTUS SOLOSTAR) 100 UNIT/ML Solostar Pen Inject  10 Units into the skin daily at 10 pm.  5 pen  11  . Insulin Pen Needle (BD PEN NEEDLE NANO U/F) 32G X 4 MM MISC Use as directed 1 per day   250.02  100 each  11  . KLOR-CON M10 10 MEQ tablet Take 10 mEq by mouth daily.      Marland Kitchen lidocaine-prilocaine (EMLA) cream Apply 1 application topically as needed.  30 g  3  . lisinopril (PRINIVIL,ZESTRIL) 10 MG tablet Take 1 tablet (10 mg total) by mouth daily.  30 tablet  11  . nicotine (NICODERM CQ - DOSED IN MG/24 HOURS) 21 mg/24hr patch Place 21 mg onto the skin daily as needed (smoking cessation.).      Marland Kitchen potassium chloride (K-DUR) 10 MEQ tablet Take 1 tablet (10 mEq total) by mouth daily.  90 tablet  1  . sitaGLIPtin (JANUVIA) 100 MG tablet Take 1 tablet (100 mg total) by mouth daily.  30 tablet  11  . tiotropium (SPIRIVA) 18 MCG inhalation capsule Place 1 capsule (18 mcg total) into inhaler and inhale daily.  30 capsule  6   Current Facility-Administered Medications on File Prior to Visit  Medication Dose Route Frequency Provider Last Rate Last Dose  . sodium chloride 0.9 % injection 10 mL  10 mL Intravenous PRN [name], MD   10 mL at 04/25/13 1100   Review of Systems  Constitutional: Negative for unusual diaphoresis or other sweats  HENT: Negative for ringing in ear Eyes: Negative for double vision or worsening visual disturbance.  Respiratory: Negative for choking and stridor.   Gastrointestinal: Negative for vomiting or other signifcant bowel change Genitourinary: Negative for hematuria or decreased urine volume.  Musculoskeletal: Negative for other MSK pain or swelling Skin: Negative for color change and worsening wound.  Neurological: Negative for tremors and numbness other than noted  Psychiatric/Behavioral: Negative for decreased concentration or agitation other than above       Objective:   Physical Exam BP 122/72  Pulse 81  Temp(Src) 98 F (36.7 C) (Oral)  Wt 310 lb (140.615 kg)  SpO2 92% VS noted,  Constitutional: Pt  appears well-developed, well-nourished.  HENT: Head: NCAT.  Right Ear: External ear normal.  Left Ear: External ear normal.  Eyes: . Pupils are equal, round, and reactive to light. Conjunctivae and EOM are normal Neck: Normal range of motion. Neck supple.  Cardiovascular: Normal rate and regular rhythm.   Pulmonary/Chest: Effort normal and breath sounds normal.  Abd:  Soft, NT, ND, + BS - ? ascites Neurological: Pt is alert. Not confused , motor grossly intact Skin: 2+ LE pitting edema to  mid thigh Scrotum contents overall 3+ swollen bilat, mild tender right testicle Psychiatric: Pt behavior is normal. No agitation.     Assessment & Plan:

## 2014-03-22 NOTE — Assessment & Plan Note (Signed)
With wt and edema increase, for increase torsemide to 40 bid, refer Dr Acie Fredrickson

## 2014-03-22 NOTE — Assessment & Plan Note (Signed)
Improved cbg's,  to f/u any worsening symptoms or concerns, cont same tx

## 2014-03-22 NOTE — Assessment & Plan Note (Signed)
stable overall by history and exam, recent data reviewed with pt, and pt to continue medical treatment as before,  to f/u any worsening symptoms or concerns BP Readings from Last 3 Encounters:  03/20/14 122/72  02/21/14 102/62  02/18/14 135/57

## 2014-03-26 ENCOUNTER — Telehealth: Payer: Self-pay | Admitting: Internal Medicine

## 2014-03-26 MED ORDER — GLUCOSE BLOOD VI STRP: ORAL_STRIP | Status: DC

## 2014-03-26 NOTE — Telephone Encounter (Signed)
Notified pt strips sent to [name]

## 2014-03-26 NOTE — Telephone Encounter (Signed)
Pt called requesting refill on glucose blood (ACCU-CHEK AVIVA PLUS) test strip, He is completely out.  Pharmacy is the [facility] on file.    Thank you!!

## 2014-03-27 ENCOUNTER — Encounter: Payer: Self-pay | Admitting: Cardiology

## 2014-03-27 ENCOUNTER — Ambulatory Visit (INDEPENDENT_AMBULATORY_CARE_PROVIDER_SITE_OTHER): Payer: Commercial Managed Care - HMO | Admitting: Cardiology

## 2014-03-27 ENCOUNTER — Other Ambulatory Visit: Payer: Commercial Managed Care - HMO

## 2014-03-27 VITALS — BP 148/82 | HR 82 | Ht 75.0 in | Wt 306.4 lb

## 2014-03-27 DIAGNOSIS — N183 Chronic kidney disease, stage 3 unspecified: Secondary | ICD-10-CM

## 2014-03-27 DIAGNOSIS — I350 Nonrheumatic aortic (valve) stenosis: Secondary | ICD-10-CM

## 2014-03-27 DIAGNOSIS — C8590 Non-Hodgkin lymphoma, unspecified, unspecified site: Secondary | ICD-10-CM

## 2014-03-27 DIAGNOSIS — I509 Heart failure, unspecified: Secondary | ICD-10-CM

## 2014-03-27 DIAGNOSIS — I5043 Acute on chronic combined systolic (congestive) and diastolic (congestive) heart failure: Secondary | ICD-10-CM

## 2014-03-27 DIAGNOSIS — R0989 Other specified symptoms and signs involving the circulatory and respiratory systems: Secondary | ICD-10-CM

## 2014-03-27 DIAGNOSIS — R0609 Other forms of dyspnea: Secondary | ICD-10-CM

## 2014-03-27 DIAGNOSIS — E1129 Type 2 diabetes mellitus with other diabetic kidney complication: Secondary | ICD-10-CM

## 2014-03-27 DIAGNOSIS — I359 Nonrheumatic aortic valve disorder, unspecified: Secondary | ICD-10-CM

## 2014-03-27 DIAGNOSIS — E669 Obesity, unspecified: Secondary | ICD-10-CM

## 2014-03-27 DIAGNOSIS — R0600 Dyspnea, unspecified: Secondary | ICD-10-CM

## 2014-03-27 DIAGNOSIS — I5022 Chronic systolic (congestive) heart failure: Secondary | ICD-10-CM

## 2014-03-27 DIAGNOSIS — C8589 Other specified types of non-Hodgkin lymphoma, extranodal and solid organ sites: Secondary | ICD-10-CM

## 2014-03-27 LAB — BASIC METABOLIC PANEL
BUN: 25 mg/dL — ABNORMAL HIGH (ref 6–23)
CO2: 28 mEq/L (ref 19–32)
Calcium: 9 mg/dL (ref 8.4–10.5)
Chloride: 103 mEq/L (ref 96–112)
Creatinine, Ser: 1.5 mg/dL (ref 0.4–1.5)
GFR: 59.49 mL/min — ABNORMAL LOW (ref >60.00)
Glucose, Bld: 175 mg/dL — ABNORMAL HIGH (ref 70–99)
Potassium: 3.4 mEq/L — ABNORMAL LOW (ref 3.5–5.1)
Sodium: 141 mEq/L (ref 135–145)

## 2014-03-27 NOTE — Assessment & Plan Note (Signed)
Pt has had scrotal edema- a little better today after his diuretics were increased 9/5

## 2014-03-27 NOTE — Assessment & Plan Note (Signed)
Chronic, multifactorial 

## 2014-03-27 NOTE — Assessment & Plan Note (Signed)
No syncope or chest pain

## 2014-03-27 NOTE — Assessment & Plan Note (Signed)
S/P chemo Rx March 2014

## 2014-03-27 NOTE — Assessment & Plan Note (Signed)
No angina 

## 2014-03-27 NOTE — Assessment & Plan Note (Signed)
Wgt in Nov 2014- 267, wgt 03/20/14-310, wgt today 306

## 2014-03-27 NOTE — Progress Notes (Signed)
03/27/2014 [name] [name]   [birth year]  [id number]  Primary Physicia Cathlean Cower, MD Primary Cardiologist: Dr Acie Fredrickson  HPI:  Mr [name] [name] is a complicated pt followed by Dr Acie Fredrickson and several other providers. He has CAD,AS,ICM, lymphoma, Pulm HTN, morbid obesity, CRI, and uncontrolled diabetes. His last echo showed an EF of 35-40%. He has moderate AS with a pk gradient of 7mHg. His PA pressure was 62 mmHg. He had been doing pretty well till earlier this year. He says he was drinking sweet tea and sodas all day. His BS was uncontrolled. His wgt went up 20 lbs or more. He has corrected his diet and his BS is apparently under better control. He recently saw Dr JJenny Reichmannwith complaints of scrotal edema. His wgt was up to 310. His Demadex was increased to 40 mg BID and he is seen in the office today for follow up. He says he feels a little better. His DOE has not changed but his scrotal edema is better. His wgt is down 4 lbs.    Current Outpatient Prescriptions  Medication Sig Dispense Refill  . albuterol (PROVENTIL HFA;VENTOLIN HFA) 108 (90 BASE) MCG/ACT inhaler Inhale 2 puffs into the lungs every 6 (six) hours as needed for wheezing or shortness of breath.  1 Inhaler  11  . AMBULATORY NON FORMULARY MEDICATION Home O2 @@ 2 LMP for 8 hrs at night      . AMBULATORY NON FORMULARY MEDICATION CPAP      . aspirin 81 MG EC tablet Take 1 tablet (81 mg total) by mouth daily. STOP NOW AND RESTART ON 06/06/2013  30 tablet  12  . atorvastatin (LIPITOR) 20 MG tablet Take 1 tablet (20 mg total) by mouth daily at 6 PM.  90 tablet  1  . Blood Glucose Monitoring Suppl (ACCU-CHEK AVIVA PLUS) W/DEVICE KIT Use to check blood sugars twice a day Dx 250.00  1 kit  0  . carvedilol (COREG) 12.5 MG tablet Take 1 tablet (12.5 mg total) by mouth 2 (two) times daily.  180 tablet  3  . ciprofloxacin (CIPRO) 250 MG tablet Take 1 tablet (250 mg total) by mouth 2 (two) times daily.  20 tablet  0  . glipiZIDE (GLUCOTROL) 10 MG  tablet Take 1 tablet (10 mg total) by mouth 2 (two) times daily before a meal.  180 tablet  3  . Glucosamine-Chondroitin 250-200 MG TABS Take by mouth.      .Marland Kitchenglucose blood (ACCU-CHEK AVIVA PLUS) test strip Use to check blood sugars twice a day Dx 250.00  60 each  11  . Insulin Glargine (LANTUS SOLOSTAR) 100 UNIT/ML Solostar Pen Inject 10 Units into the skin daily at 10 pm.  5 pen  11  . Insulin Pen Needle (BD PEN NEEDLE NANO U/F) 32G X 4 MM MISC Use as directed 1 per day   250.02  100 each  11  . lidocaine-prilocaine (EMLA) cream Apply 1 application topically as needed.  30 g  3  . lisinopril (PRINIVIL,ZESTRIL) 10 MG tablet Take 1 tablet (10 mg total) by mouth daily.  30 tablet  11  . nicotine (NICODERM CQ - DOSED IN MG/24 HOURS) 21 mg/24hr patch Place 21 mg onto the skin daily as needed (smoking cessation.).      .Marland Kitchenpotassium chloride (K-DUR) 10 MEQ tablet Take 1 tablet (10 mEq total) by mouth daily.  90 tablet  1  . sitaGLIPtin (JANUVIA) 100 MG tablet Take 1 tablet (100 mg total) by mouth  daily.  30 tablet  11  . tiotropium (SPIRIVA) 18 MCG inhalation capsule Place 1 capsule (18 mcg total) into inhaler and inhale daily.  30 capsule  6  . torsemide (DEMADEX) 20 MG tablet 2 tabs by mouth twice per day  120 tablet  5   No current facility-administered medications for this visit.   Facility-Administered Medications Ordered in Other Visits  Medication Dose Route Frequency Provider Last Rate Last Dose  . sodium chloride 0.9 % injection 10 mL  10 mL Intravenous PRN [name], MD   10 mL at 04/25/13 1100    Allergies  Allergen Reactions  . Other Hives    Poison ivy.     History   Social History  . Marital Status: Single    [name]    Number of Children: 2  . Years of Education: 12   Occupational History  . retired    Social History Main Topics  . Smoking status: Former Smoker -- 1.00 packs/day for 53 years    Types: Cigarettes    Quit date: 07/19/2011  . Smokeless  tobacco: Never Used     Comment: pt states he does not smoke as long as he uses patch  . Alcohol Use: No     Comment: 06/06/2012 "haven't had a drink in 22 years; I'm an alcoholic"  . Drug Use: No  . Sexual Activity: No   Other Topics Concern  . 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   Lives by self, since '78   [name] in [place]     Regular exercise-no   Caffeine Use-no     Review of Systems: General: negative for chills, fever, night sweats or weight changes.  Cardiovascular: negative for chest pain Dermatological: negative for rash Respiratory: negative for cough or wheezing Urologic: negative for hematuria Abdominal: negative for nausea, vomiting, diarrhea, bright red blood per rectum, melena, or hematemesis Neurologic: negative for visual changes, syncope, or dizziness All other systems reviewed and are otherwise negative except as noted above.    Blood pressure 148/82, pulse 82, height _0  (1.905 m), weight 306 lb 6.4 oz (138.982 kg), SpO2 92.00%.  General appearance: alert, cooperative, no distress and morbidly obese Lungs: decreased breath sounds Lt base Heart: regular rate and rhythm and 2/6 AS murmur Extremities: 2+ pittng edema mild Lt scrotal edema   ASSESSMENT AND PLAN:   Acute on chronic combined systolic and diastolic congestive heart failure Pt has had scrotal edema- a little better today after his diuretics were increased 9/5  Chronic systolic congestive heart failure Last EF was 35-40% by echo June 2014  Chronic renal insufficiency, stage III (moderate) Stage 3b.  Lymphoma S/P chemo Rx March 2014  DOE (dyspnea on exertion) Chronic, multifactorial  Diabetes mellitus, Type 2 NIDDM Under better control, followed by Dr Jenny Reichmann  CAD, CTO RCA with colloat. S/P OM1 BMS 06/06/12 No angina  Aortic stenosis, moderate by echo June 2014 No syncope or chest pain  Obesity (BMI 30-39.9) Wgt in Nov  2014- 267, wgt 03/20/14-310, wgt today 60   PLAN  Mr Knee has acute on chronic combined CHF. He is improved but has a long way to go to get back to baseline. I suggested he continue his current increased diuretic dose. I'll check a BMP today. If he does not continue to improve I suspect he will need admission for IV diuretics. We'll arrange for follow up in one week.  Gregery Walberg KPA-C 03/27/2014 9:58 AM

## 2014-03-27 NOTE — Assessment & Plan Note (Signed)
Stage 3b °

## 2014-03-27 NOTE — Assessment & Plan Note (Signed)
Under better control, followed by Dr Jenny Reichmann

## 2014-03-27 NOTE — Patient Instructions (Signed)
Your physician recommends that you continue on your current medications as directed. Please refer to the Current Medication list given to you today.   Your physician recommends that you return for lab work in:  [facility] recommends that you schedule a follow-up appointment in:  [place] NP / PA  @ [place]

## 2014-03-27 NOTE — Assessment & Plan Note (Signed)
Last EF was 35-40% by echo June 2014

## 2014-03-28 ENCOUNTER — Telehealth: Payer: Self-pay | Admitting: *Deleted

## 2014-03-28 DIAGNOSIS — C8590 Non-Hodgkin lymphoma, unspecified, unspecified site: Secondary | ICD-10-CM

## 2014-03-28 MED ORDER — POTASSIUM CHLORIDE ER 10 MEQ PO TBCR: 20.0000 meq | EXTENDED_RELEASE_TABLET | Freq: Every day | ORAL | Status: DC

## 2014-03-28 NOTE — Telephone Encounter (Signed)
Patient notified of lab results and instructed to increase K+ from 10 to 55mEq. Rx was sent to pharmacy electronically.

## 2014-03-28 NOTE — Telephone Encounter (Signed)
Message copied by Fidel Levy on Fri Mar 28, 2014  3:10 PM ------      Message from: Erlene Quan      Created: Fri Mar 28, 2014  2:32 PM       Please ask Mr Haegele to increase his K+ to 20 meq daily.            Kerin Ransom PA-C      03/28/2014      2:31 PM       ------

## 2014-04-02 ENCOUNTER — Other Ambulatory Visit: Payer: Self-pay | Admitting: Pulmonary Disease

## 2014-04-04 ENCOUNTER — Other Ambulatory Visit: Payer: Self-pay | Admitting: Pulmonary Disease

## 2014-04-07 ENCOUNTER — Encounter (HOSPITAL_COMMUNITY): Payer: Self-pay | Admitting: Emergency Medicine

## 2014-04-07 ENCOUNTER — Emergency Department (HOSPITAL_COMMUNITY): Payer: Medicare HMO

## 2014-04-07 ENCOUNTER — Inpatient Hospital Stay (HOSPITAL_COMMUNITY)
Admission: EM | Admit: 2014-04-07 | Discharge: 2014-04-10 | DRG: 293 | Disposition: A | Discharge status: Home / Self Care | Payer: Medicare HMO | Attending: Cardiology | Admitting: Cardiology

## 2014-04-07 DIAGNOSIS — I350 Nonrheumatic aortic (valve) stenosis: Secondary | ICD-10-CM | POA: Diagnosis present

## 2014-04-07 DIAGNOSIS — N5089 Other specified disorders of the male genital organs: Secondary | ICD-10-CM | POA: Diagnosis present

## 2014-04-07 DIAGNOSIS — I2510 Atherosclerotic heart disease of native coronary artery without angina pectoris: Secondary | ICD-10-CM | POA: Diagnosis present

## 2014-04-07 DIAGNOSIS — N183 Chronic kidney disease, stage 3 unspecified: Secondary | ICD-10-CM | POA: Diagnosis present

## 2014-04-07 DIAGNOSIS — N289 Disorder of kidney and ureter, unspecified: Secondary | ICD-10-CM

## 2014-04-07 DIAGNOSIS — Z794 Long term (current) use of insulin: Secondary | ICD-10-CM

## 2014-04-07 DIAGNOSIS — E669 Obesity, unspecified: Secondary | ICD-10-CM | POA: Diagnosis present

## 2014-04-07 DIAGNOSIS — Z6830 Body mass index (BMI) 30.0-30.9, adult: Secondary | ICD-10-CM

## 2014-04-07 DIAGNOSIS — I252 Old myocardial infarction: Secondary | ICD-10-CM

## 2014-04-07 DIAGNOSIS — I2789 Other specified pulmonary heart diseases: Secondary | ICD-10-CM | POA: Diagnosis present

## 2014-04-07 DIAGNOSIS — E119 Type 2 diabetes mellitus without complications: Secondary | ICD-10-CM | POA: Diagnosis present

## 2014-04-07 DIAGNOSIS — Z7982 Long term (current) use of aspirin: Secondary | ICD-10-CM

## 2014-04-07 DIAGNOSIS — E785 Hyperlipidemia, unspecified: Secondary | ICD-10-CM | POA: Diagnosis present

## 2014-04-07 DIAGNOSIS — I359 Nonrheumatic aortic valve disorder, unspecified: Secondary | ICD-10-CM | POA: Diagnosis present

## 2014-04-07 DIAGNOSIS — F17200 Nicotine dependence, unspecified, uncomplicated: Secondary | ICD-10-CM | POA: Diagnosis present

## 2014-04-07 DIAGNOSIS — I5043 Acute on chronic combined systolic (congestive) and diastolic (congestive) heart failure: Secondary | ICD-10-CM | POA: Diagnosis present

## 2014-04-07 DIAGNOSIS — N2889 Other specified disorders of kidney and ureter: Secondary | ICD-10-CM | POA: Diagnosis present

## 2014-04-07 DIAGNOSIS — G4733 Obstructive sleep apnea (adult) (pediatric): Secondary | ICD-10-CM | POA: Diagnosis present

## 2014-04-07 DIAGNOSIS — I129 Hypertensive chronic kidney disease with stage 1 through stage 4 chronic kidney disease, or unspecified chronic kidney disease: Secondary | ICD-10-CM | POA: Diagnosis present

## 2014-04-07 DIAGNOSIS — Z9119 Patient's noncompliance with other medical treatment and regimen: Secondary | ICD-10-CM

## 2014-04-07 DIAGNOSIS — I509 Heart failure, unspecified: Secondary | ICD-10-CM | POA: Diagnosis present

## 2014-04-07 DIAGNOSIS — Z91199 Patient's noncompliance with other medical treatment and regimen due to unspecified reason: Secondary | ICD-10-CM

## 2014-04-07 DIAGNOSIS — Z8572 Personal history of non-Hodgkin lymphomas: Secondary | ICD-10-CM

## 2014-04-07 DIAGNOSIS — I10 Essential (primary) hypertension: Secondary | ICD-10-CM | POA: Diagnosis present

## 2014-04-07 DIAGNOSIS — Z87891 Personal history of nicotine dependence: Secondary | ICD-10-CM

## 2014-04-07 HISTORY — DX: Morbid (severe) obesity due to excess calories: E66.01

## 2014-04-07 HISTORY — DX: Patient's noncompliance with dietary regimen: Z91.11

## 2014-04-07 HISTORY — DX: Nonrheumatic aortic (valve) stenosis: I35.0

## 2014-04-07 HISTORY — DX: Patient's noncompliance with dietary regimen due to unspecified reason: Z91.119

## 2014-04-07 LAB — COMPREHENSIVE METABOLIC PANEL
ALBUMIN: 3.7 g/dL (ref 3.5–5.2)
ALK PHOS: 68 U/L (ref 39–117)
ALT: 13 U/L (ref 0–53)
AST: 13 U/L (ref 0–37)
Anion gap: 15 (ref 5–15)
BUN: 27 mg/dL — ABNORMAL HIGH (ref 6–23)
CALCIUM: 9.1 mg/dL (ref 8.4–10.5)
CO2: 26 mEq/L (ref 19–32)
Chloride: 99 mEq/L (ref 96–112)
Creatinine, Ser: 1.46 mg/dL — ABNORMAL HIGH (ref 0.50–1.35)
GFR calc Af Amer: 56 mL/min — ABNORMAL LOW (ref >90)
GFR calc non Af Amer: 48 mL/min — ABNORMAL LOW (ref >90)
GLUCOSE: 183 mg/dL — AB (ref 70–99)
Potassium: 3.8 mEq/L (ref 3.7–5.3)
SODIUM: 140 meq/L (ref 137–147)
TOTAL PROTEIN: 7 g/dL (ref 6.0–8.3)
Total Bilirubin: 0.8 mg/dL (ref 0.3–1.2)

## 2014-04-07 LAB — CBC
HCT: 33.6 % — ABNORMAL LOW (ref 39.0–52.0)
Hemoglobin: 11 g/dL — ABNORMAL LOW (ref 13.0–17.0)
MCH: 28.1 pg (ref 26.0–34.0)
MCHC: 32.7 g/dL (ref 30.0–36.0)
MCV: 85.7 fL (ref 78.0–100.0)
PLATELETS: 181 10*3/uL (ref 150–400)
RBC: 3.92 MIL/uL — ABNORMAL LOW (ref 4.22–5.81)
RDW: 14 % (ref 11.5–15.5)
WBC: 6.3 10*3/uL (ref 4.0–10.5)

## 2014-04-07 LAB — GLUCOSE, CAPILLARY: Glucose-Capillary: 256 mg/dL — ABNORMAL HIGH (ref 70–99)

## 2014-04-07 LAB — TROPONIN I

## 2014-04-07 LAB — PRO B NATRIURETIC PEPTIDE: Pro B Natriuretic peptide (BNP): 1016 pg/mL — ABNORMAL HIGH (ref 0–125)

## 2014-04-07 LAB — D-DIMER, QUANTITATIVE: D-Dimer, Quant: 1.5 ug/mL-FEU — ABNORMAL HIGH (ref 0.00–0.48)

## 2014-04-07 MED ORDER — LISINOPRIL 10 MG PO TABS
10.0000 mg | ORAL_TABLET | Freq: Every day | ORAL | Status: DC
  Administered 2014-04-08 – 2014-04-10 (×3): 10 mg via ORAL
  Filled 2014-04-07 (×3): qty 1

## 2014-04-07 MED ORDER — INSULIN GLARGINE 100 UNIT/ML ~~LOC~~ SOLN
20.0000 [IU] | Freq: Every day | SUBCUTANEOUS | Status: DC
  Administered 2014-04-07 – 2014-04-09 (×3): 20 [IU] via SUBCUTANEOUS
  Filled 2014-04-07 (×4): qty 0.2

## 2014-04-07 MED ORDER — TIOTROPIUM BROMIDE MONOHYDRATE 18 MCG IN CAPS
18.0000 ug | ORAL_CAPSULE | Freq: Every day | RESPIRATORY_TRACT | Status: DC | PRN
  Filled 2014-04-07: qty 5

## 2014-04-07 MED ORDER — ASPIRIN EC 81 MG PO TBEC
81.0000 mg | DELAYED_RELEASE_TABLET | Freq: Every day | ORAL | Status: DC
  Administered 2014-04-08 – 2014-04-10 (×3): 81 mg via ORAL
  Filled 2014-04-07 (×3): qty 1

## 2014-04-07 MED ORDER — ALBUTEROL SULFATE HFA 108 (90 BASE) MCG/ACT IN AERS: 2.0000 | INHALATION_SPRAY | Freq: Four times a day (QID) | RESPIRATORY_TRACT | Status: DC | PRN

## 2014-04-07 MED ORDER — ALBUTEROL SULFATE (2.5 MG/3ML) 0.083% IN NEBU: 2.5000 mg | INHALATION_SOLUTION | Freq: Four times a day (QID) | RESPIRATORY_TRACT | Status: DC | PRN

## 2014-04-07 MED ORDER — LINAGLIPTIN 5 MG PO TABS
5.0000 mg | ORAL_TABLET | Freq: Every day | ORAL | Status: DC
  Administered 2014-04-08 – 2014-04-10 (×3): 5 mg via ORAL
  Filled 2014-04-07 (×3): qty 1

## 2014-04-07 MED ORDER — SODIUM CHLORIDE 0.9 % IV SOLN
INTRAVENOUS | Status: DC
  Administered 2014-04-07: 10 mL/h via INTRAVENOUS

## 2014-04-07 MED ORDER — SODIUM CHLORIDE 0.9 % IV SOLN: 250.0000 mL | INTRAVENOUS | Status: DC | PRN

## 2014-04-07 MED ORDER — SODIUM CHLORIDE 0.9 % IJ SOLN: 3.0000 mL | INTRAMUSCULAR | Status: DC | PRN

## 2014-04-07 MED ORDER — SODIUM CHLORIDE 0.9 % IJ SOLN
3.0000 mL | Freq: Two times a day (BID) | INTRAMUSCULAR | Status: DC
  Administered 2014-04-07 – 2014-04-10 (×6): 3 mL via INTRAVENOUS

## 2014-04-07 MED ORDER — POTASSIUM CHLORIDE ER 10 MEQ PO TBCR
20.0000 meq | EXTENDED_RELEASE_TABLET | Freq: Every day | ORAL | Status: DC
  Administered 2014-04-08 – 2014-04-10 (×3): 20 meq via ORAL
  Filled 2014-04-07 (×3): qty 2

## 2014-04-07 MED ORDER — ONDANSETRON HCL 4 MG/2ML IJ SOLN: 4.0000 mg | Freq: Four times a day (QID) | INTRAMUSCULAR | Status: DC | PRN

## 2014-04-07 MED ORDER — INSULIN ASPART 100 UNIT/ML ~~LOC~~ SOLN
0.0000 [IU] | Freq: Three times a day (TID) | SUBCUTANEOUS | Status: DC
  Administered 2014-04-08: 3 [IU] via SUBCUTANEOUS
  Administered 2014-04-08 (×2): 2 [IU] via SUBCUTANEOUS
  Administered 2014-04-09: 3 [IU] via SUBCUTANEOUS
  Administered 2014-04-09: 2 [IU] via SUBCUTANEOUS
  Administered 2014-04-09: 3 [IU] via SUBCUTANEOUS
  Administered 2014-04-10 (×2): 2 [IU] via SUBCUTANEOUS

## 2014-04-07 MED ORDER — FUROSEMIDE 10 MG/ML IJ SOLN
40.0000 mg | Freq: Two times a day (BID) | INTRAMUSCULAR | Status: DC
  Administered 2014-04-07 – 2014-04-10 (×6): 40 mg via INTRAVENOUS
  Filled 2014-04-07 (×7): qty 4

## 2014-04-07 MED ORDER — GLIPIZIDE 10 MG PO TABS
10.0000 mg | ORAL_TABLET | Freq: Two times a day (BID) | ORAL | Status: DC
  Administered 2014-04-08 – 2014-04-10 (×5): 10 mg via ORAL
  Filled 2014-04-07 (×7): qty 1

## 2014-04-07 MED ORDER — ATORVASTATIN CALCIUM 20 MG PO TABS
20.0000 mg | ORAL_TABLET | Freq: Every day | ORAL | Status: DC
  Administered 2014-04-07 – 2014-04-09 (×3): 20 mg via ORAL
  Filled 2014-04-07 (×4): qty 1

## 2014-04-07 MED ORDER — ACETAMINOPHEN 325 MG PO TABS: 650.0000 mg | ORAL_TABLET | ORAL | Status: DC | PRN

## 2014-04-07 MED ORDER — FUROSEMIDE 10 MG/ML IJ SOLN
40.0000 mg | Freq: Once | INTRAMUSCULAR | Status: AC
  Administered 2014-04-07: 40 mg via INTRAVENOUS
  Filled 2014-04-07: qty 4

## 2014-04-07 MED ORDER — CARVEDILOL 12.5 MG PO TABS
12.5000 mg | ORAL_TABLET | Freq: Two times a day (BID) | ORAL | Status: DC
  Administered 2014-04-07 – 2014-04-10 (×6): 12.5 mg via ORAL
  Filled 2014-04-07 (×7): qty 1

## 2014-04-07 MED ORDER — HEPARIN SODIUM (PORCINE) 5000 UNIT/ML IJ SOLN
5000.0000 [IU] | Freq: Three times a day (TID) | INTRAMUSCULAR | Status: DC
  Administered 2014-04-07 – 2014-04-10 (×8): 5000 [IU] via SUBCUTANEOUS
  Filled 2014-04-07 (×10): qty 1

## 2014-04-07 NOTE — ED Provider Notes (Signed)
CSN: [id number]     Arrival date & time 04/07/14  1020 History   First MD Initiated Contact with Patient 04/07/14 1028     Chief Complaint  Patient presents with  . Shortness of Breath     (Consider location/radiation/quality/duration/timing/severity/associated sxs/prior Treatment) Patient is a 68 y.o. male presenting with shortness of breath. The history is provided by the patient.  Shortness of Breath Associated symptoms: no abdominal pain, no chest pain, no fever, no headaches, no neck pain, no rash, no sore throat and no vomiting   pt with hx ischemic cm, dm, chf, c/o 4-5 lb weight increase and progressively worsening dyspnea in the past 1-2 weeks, despite doubling his demadex dose.  Moderate, constant. Denies indiscretion w food/fluids. Compliant w normal meds. Urinating of normal frequency. +increased bil leg edema. Denies orthopnea or pnd. No chest pain. No cough or fevers. Dyspnea worse w activity or exertion.   Past Medical History  Diagnosis Date  . Ischemic cardiomyopathy   . Type II diabetes mellitus   . Hypertension   . Hypercholesterolemia   . Heart murmur   . Shortness of breath     "all the time" (06/06/2012)  . Lower GI bleeding 1980's    "when I was drinking alot" (06/06/2012)  . Arthritis     "both shoulders" (06/06/2012)  . Chronic combined systolic and diastolic CHF (congestive heart failure) 07/10/2012  . GERD (gastroesophageal reflux disease) 07/12/2012  . OSA on CPAP     uses a cpap  . Myocardial infarction 11/13  . Coronary artery disease   . Lymphoma   . History of alcoholism   . Sleep apnea   . Aortic stenosis   . Pancreatitis   . Diverticulosis   . Pneumonia   . Personal history of rectal adenoma 05/28/2013   Past Surgical History  Procedure Laterality Date  . Coronary angioplasty with stent placement  06/06/2012    "1"  . Lymph gland excision  07/13/2012    Procedure: CERVICAL LYMPH GLAND EXCISION;  Surgeon: Haywood Lasso, MD;   Location: [facility];  Service: General;  Laterality: Right;  . Portacath placement  07/30/2012    Procedure: INSERTION PORT-A-CATH;  Surgeon: Haywood Lasso, MD;  Location: [facility];  Service: General;  Laterality: Right;  . Colonoscopy N/A 05/28/2013    Procedure: COLONOSCOPY;  Surgeon: Gatha Mayer, MD;  Location: [facility];  Service: Endoscopy;  Laterality: N/A;   Family History  Problem Relation Age of Onset  . Osteoarthritis Mother   . Osteoarthritis Father   . Colon cancer Daughter   . Diabetes Father     entire family  . Heart disease Father    History  Substance Use Topics  . Smoking status: Former Smoker -- 1.00 packs/day for 53 years    Types: Cigarettes    Quit date: 07/19/2011  . Smokeless tobacco: Never Used     Comment: pt states he does not smoke as long as he uses patch  . Alcohol Use: No     Comment: 06/06/2012 "haven't had a drink in 22 years; I'm an alcoholic"    Review of Systems  Constitutional: Negative for fever and chills.  HENT: Negative for sore throat.   Eyes: Negative for redness.  Respiratory: Positive for shortness of breath.   Cardiovascular: Positive for leg swelling. Negative for chest pain.  Gastrointestinal: Negative for vomiting, abdominal pain and diarrhea.  Endocrine: Negative for polyuria.  Genitourinary: Negative for flank pain.  Musculoskeletal:  Negative for back pain and neck pain.  Skin: Negative for rash.  Neurological: Negative for headaches.  Hematological: Does not bruise/bleed easily.  Psychiatric/Behavioral: Negative for confusion.      Allergies  Other  Home Medications   Prior to Admission medications   Medication Sig Start Date End Date Taking? Authorizing Provider  albuterol (PROVENTIL HFA;VENTOLIN HFA) 108 (90 BASE) MCG/ACT inhaler Inhale 2 puffs into the lungs every 6 (six) hours as needed for wheezing or shortness of breath. 02/04/14   Biagio Borg, MD  AMBULATORY NON FORMULARY MEDICATION  Home O2 @@ 2 LMP for 8 hrs at night    Historical Provider, MD  AMBULATORY NON FORMULARY MEDICATION CPAP    Historical Provider, MD  aspirin 81 MG EC tablet Take 1 tablet (81 mg total) by mouth daily. STOP NOW AND RESTART ON 06/06/2013 05/28/13   Gatha Mayer, MD  atorvastatin (LIPITOR) 20 MG tablet Take 1 tablet (20 mg total) by mouth daily at 6 PM. 09/16/13   Josue Hector, MD  Blood Glucose Monitoring Suppl (ACCU-CHEK AVIVA PLUS) W/DEVICE KIT Use to check blood sugars twice a day Dx 250.00 02/07/14   Biagio Borg, MD  carvedilol (COREG) 12.5 MG tablet Take 1 tablet (12.5 mg total) by mouth 2 (two) times daily. 02/25/13   Thayer Headings, MD  ciprofloxacin (CIPRO) 250 MG tablet Take 1 tablet (250 mg total) by mouth 2 (two) times daily. 03/20/14   Biagio Borg, MD  glipiZIDE (GLUCOTROL) 10 MG tablet Take 1 tablet (10 mg total) by mouth 2 (two) times daily before a meal. 09/09/13   Biagio Borg, MD  Glucosamine-Chondroitin 250-200 MG TABS Take by mouth.    Historical Provider, MD  glucose blood (ACCU-CHEK AVIVA PLUS) test strip Use to check blood sugars twice a day Dx 250.00 03/26/14   Biagio Borg, MD  Insulin Glargine (LANTUS SOLOSTAR) 100 UNIT/ML Solostar Pen Inject 10 Units into the skin daily at 10 pm. 02/21/14   Biagio Borg, MD  Insulin Pen Needle (BD PEN NEEDLE NANO U/F) 32G X 4 MM MISC Use as directed 1 per day   250.02 02/21/14   Biagio Borg, MD  lidocaine-prilocaine (EMLA) cream Apply 1 application topically as needed. 12/11/13   [name], MD  lisinopril (PRINIVIL,ZESTRIL) 10 MG tablet Take 1 tablet (10 mg total) by mouth daily. 05/09/13   Thayer Headings, MD  nicotine (NICODERM CQ - DOSED IN MG/24 HOURS) 21 mg/24hr patch Place 21 mg onto the skin daily as needed (smoking cessation.).    Historical Provider, MD  potassium chloride (K-DUR) 10 MEQ tablet Take 2 tablets (20 mEq total) by mouth daily. 03/28/14   Erlene Quan, PA-C  sitaGLIPtin (JANUVIA) 100 MG tablet Take 1 tablet (100 mg total)  by mouth daily. 06/26/13   Biagio Borg, MD  tiotropium (SPIRIVA) 18 MCG inhalation capsule Place 1 capsule (18 mcg total) into inhaler and inhale daily. 03/14/13   Kathee Delton, MD  torsemide (DEMADEX) 20 MG tablet 2 tabs by mouth twice per day 03/20/14   Biagio Borg, MD   BP 125/66  Pulse 81  Temp(Src) 97.2 F (36.2 C)  Resp 25  Ht $R'6\' 3"'nM$  (1.905 m)  Wt 309 lb 9 oz (140.417 kg)  BMI 38.69 kg/m2  SpO2 92% Physical Exam  Nursing note and vitals reviewed. Constitutional: He is oriented to person, place, and time. He appears well-developed and well-nourished. No distress.  HENT:  Mouth/Throat: Oropharynx is clear and moist.  Eyes: Conjunctivae are normal. No scleral icterus.  Neck: Neck supple. No tracheal deviation present.  Cardiovascular: Normal rate, regular rhythm and intact distal pulses.   Murmur heard. Pulmonary/Chest: Effort normal. No accessory muscle usage. No respiratory distress. He has rales.  Abdominal: Soft. Bowel sounds are normal. He exhibits no distension. There is no tenderness.  Genitourinary:  No cva tenderness  Musculoskeletal: Normal range of motion. He exhibits edema. He exhibits no tenderness.  Edema to mid thighs bilateral, symmetric.   Neurological: He is alert and oriented to person, place, and time.  Skin: Skin is warm and dry. He is not diaphoretic.  Psychiatric: He has a normal mood and affect.    ED Course  Procedures (including critical care time) Labs Review   Results for orders placed during the hospital encounter of 04/07/14  TROPONIN I      Result Value Ref Range   Troponin I <0.30  <0.30 ng/mL  PRO B NATRIURETIC PEPTIDE      Result Value Ref Range   Pro B Natriuretic peptide (BNP) 1016.0 (*) 0 - 125 pg/mL  CBC      Result Value Ref Range   WBC 6.3  4.0 - 10.5 K/uL   RBC 3.92 (*) 4.22 - 5.81 MIL/uL   Hemoglobin 11.0 (*) 13.0 - 17.0 g/dL   HCT 33.6 (*) 39.0 - 52.0 %   MCV 85.7  78.0 - 100.0 fL   MCH 28.1  26.0 - 34.0 pg   MCHC 32.7   30.0 - 36.0 g/dL   RDW 14.0  11.5 - 15.5 %   Platelets 181  150 - 400 K/uL  COMPREHENSIVE METABOLIC PANEL      Result Value Ref Range   Sodium 140  137 - 147 mEq/L   Potassium 3.8  3.7 - 5.3 mEq/L   Chloride 99  96 - 112 mEq/L   CO2 26  19 - 32 mEq/L   Glucose, Bld 183 (*) 70 - 99 mg/dL   BUN 27 (*) 6 - 23 mg/dL   Creatinine, Ser 1.46 (*) 0.50 - 1.35 mg/dL   Calcium 9.1  8.4 - 10.5 mg/dL   Total Protein 7.0  6.0 - 8.3 g/dL   Albumin 3.7  3.5 - 5.2 g/dL   AST 13  0 - 37 U/L   ALT 13  0 - 53 U/L   Alkaline Phosphatase 68  39 - 117 U/L   Total Bilirubin 0.8  0.3 - 1.2 mg/dL   GFR calc non Af Amer 48 (*) >90 mL/min   GFR calc Af Amer 56 (*) >90 mL/min   Anion gap 15  5 - 15   Dg Chest 2 View  04/07/2014   CLINICAL DATA:  Shortness of breath and weakness  EXAM: CHEST  2 VIEW  COMPARISON:  02/04/2014  FINDINGS: Cardiac shadow is mildly enlarged. The lungs are well aerated bilaterally without focal infiltrate or sizable effusion. A right-sided chest port is again seen and stable.  IMPRESSION: No acute abnormality noted.   Electronically Signed   By: Inez Catalina M.D.   On: 04/07/2014 12:21      EKG Interpretation   Date/Time:  Monday April 07 2014 10:26:56 EDT Ventricular Rate:  79 PR Interval:  182 QRS Duration: 100 QT Interval:  410 QTC Calculation: 470 R Axis:   -3 Text Interpretation:  Sinus rhythm with occasional Premature ventricular  complexes Left anterior fasicular block No significant change since last  tracing Confirmed by Ashok Cordia  MD, Nikash Mortensen ([id number]) on 04/07/2014 10:36:58 AM      MDM   Iv ns. Continuous pulse ox and monitor. o2 [place]. Ecg. Cxr.  Reviewed nursing notes and prior charts for additional history.   Pt c/o sob, wt gain, edema, hx same.  Lasix iv.  Cardiology consulted re: eval in ED, possible med adjustment and facilitation close outpt follow up vs admission/obs.  Disposition per cardiology.        Mirna Mires, MD 04/07/14 [phone]

## 2014-04-07 NOTE — ED Notes (Signed)
Attempted to give report.  RN to receive pt just had a pt pass and asked if we could call in 10 minutes.

## 2014-04-07 NOTE — ED Notes (Signed)
Pt c/o shortness of breath. Pt seen By PMD on the 10th and told he was full of fluid. Pt was given medication and told to return to office today. Pt went to office and was told he needed to come here to ED. Pt with shortness of breath at rest and with exertion. Skin cool to touch.

## 2014-04-07 NOTE — ED Notes (Signed)
Pt just gave himself a puff of albuterol.

## 2014-04-07 NOTE — Progress Notes (Signed)
Patient has arrived to room 3E18. Patient is alert and oriented x4. Patient appears to be in no distress at this time. Patient has no complaints at this time. CCMD notified of admission.

## 2014-04-07 NOTE — H&P (Signed)
History and Physical  Patient ID: [name] [name] MRN: [id number], DOB: 04-07-1946 Date of Encounter: 04/07/2014, 4:40 PM Primary Physician: Cathlean Cower, MD Primary Cardiologist: Dr. Acie Fredrickson  Chief Complaint: SOB Reason for Admission: CHF  HPI: Mr. 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 He recently saw Dr. Jenny [name] for scrotal edema and weight up to 310. US showed epididymitis. Demadex was increased from 40m BID to 455mBID. He was seen in clinic by [name] RansomA-C on 03/27/14 at which time he was still felt to be volume overloaded so the higher dose was continued (down to 306 at that visit but still felt to be volume overloaded). He was supposed to f/u back in clinic today but there was a misunderstanding about his appointment time. When he showed up he was offered an appointment later this afternoon but after describing increased dyspnea, he was given the option to proceed to the ED. He reports worsening sx since his appointment with increasing LEE and DOE. He's had DOE since 2013 but feels it's occurring with progressively less activity now, which limits his activity level further. He denies SOB at rest. Scrotal edema has been improving but still present. Weight back up to 309. No chest pain, orthopnea, syncope, abdominal swelling, bleeding. Compliant with meds. He says he never heard he had to fluid restrict before - drinks "a whole lot " - notes maybe 4 16-oz bottles of water per day. Tries to avoid salt but does acknowledge that he eats it if its already in things, ie canned tuna. O2 sat 92% RA.   Past  Medical History  Diagnosis Date  . Ischemic cardiomyopathy   . Type II diabetes mellitus   . Hypertension   . Hypercholesterolemia   . Lower GI bleeding 1980's    "when I was drinking alot" (06/06/2012)  . Arthritis     "both shoulders" (06/06/2012)  . Chronic combined systolic and diastolic CHF (congestive heart failure) 07/10/2012    a. EF 3578-29%+ diastolic dysfunction by echo 12/2012.  . Marland KitchenERD (gastroesophageal reflux disease) 07/12/2012  . OSA on CPAP     uses a cpap  . Myocardial infarction 11/13  . Coronary artery disease     a. Cath 05/2012 with CTO of RCA; s/p BMS to severe OM-1 stenosis.  . Marland Kitchenymphoma     a. 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  . Marland Kitchenistory of alcoholism   . Sleep apnea   . Moderate aortic stenosis     a. Mod by echo 12/2012.  . Marland Kitchenancreatitis   . Diverticulosis   . Personal history of rectal adenoma 05/28/2013  . Dietary noncompliance   . Morbid obesity      Most Recent Cardiac Studies:  Dobutamine echo 12/2012 Study Conclusions - Left ventricle: The cavity size was normal. Wall thickness was increased in a pattern of mild LVH. Systolic function was moderately reduced. The estimated ejection fraction was in the range of 35% to 40%. Diffuse hypokinesis. Doppler parameters are consistent with restrictive physiology, indicative of decreased left ventricular diastolic compliance and/or increased left atrial pressure. -  Aortic valve: A bicuspid morphology cannot be excluded; severely thickened, severely calcified leaflets. There was moderate stenosis. Mean gradient: 54m Hg (S). Peak gradient: 426mHg (S). - Mitral valve: Mild regurgitation. - Left atrium: The atrium was mildly to moderately dilated. - Right ventricle: The cavity size was mildly dilated. - Right atrium: The atrium was mildly dilated. - Tricuspid valve: Moderate regurgitation. - Pulmonary arteries: PA peak pressure:  6267mg (S).  Cath - Diagnostic 05/2012 CARDIAC CATHETERIZATION REPORT  RonSebastyan [id number] Cardiologist: HILPixie Casinorimary Physician: BevWandra [name].P.  Primary Cardiologist: Dr. HilDebara Pickettrocedures Performed:  Left Heart Catheterization via 5 Fr right femoral artery access, upgraded to a 76F sheath  Right Heart Cathterization via 7 Fr right femoral vein access  Native Coronary Angiography  Transaortic valve pressure gradient measurement Indication(s): Dyspnea, new onset heart failure, ischemic cardiomyopathy EF 40-45%  History: [age]. male with a history of obesity, long standing diabetes type II, HPL and HTN. He presented with increasing shortness of breath, weight gain, anasarca and non-productive cough. He was started on diuretics and sent to me for cardiac evaluation. An echocardiogram revealed an LVEF of 40-45% without regional wall motion abnormalities, however, there was pseudonormal diastolic dysfunction and at least moderate aortic stenosis (AVA 1.1 cm2 - peak and mean gradients of 31 mmHg and 17 mmHg with a question of bicuspid aortic valve). He is referred for left heart catheterization and right heart catheterization and to evaluate the aortic valve gradient.  Consent: The procedure with Risks/Benefits/Alternatives and Indications was reviewed with the patient (and family). All questions were answered.  Risks / Complications include, but not limited to: Death, MI, CVA/TIA, VF/VT (with defibrillation), Bradycardia (need for temporary pacer placement), contrast induced nephropathy, bleeding / bruising / hematoma / pseudoaneurysm, vascular or coronary injury (with possible emergent CT or Vascular Surgery), adverse medication reactions, infection.  The patient (and family) voice understanding and agree to proceed.  Risks of procedure as well as the alternatives and risks of each were explained to the (patient/caregiver). Consent for procedure obtained.   Consent for signed by MD and patient with RN witness -- placed on chart.  Procedure: The patient was brought to the [facility] Lab in the fasting state and prepped and draped in the usual sterile fashion for (Right groin) access.  Sterile technique was used including antiseptics, cap, gloves, gown, hand hygiene, mask and sheet.  Skin prep: Chlorhexidine;  Time Out: Verified patient identification, verified procedure, site/side was marked, verified correct patient position, special equipment/implants available, medications/allergies/relevent history reviewed, required imaging and test results available. Performed  The right femoral head was identified using tactile and fluoroscopic technique. The right groin was anesthetized with 1% subcutaneous Lidocaine. The right Common Femoral Artery was accessed using the Modified Seldinger Technique with placement of (5 Fr) sheath using the Seldinger technique. The sheath was aspirated and flushed. A 7 Fr venous sheath was similarly placed in the right femoral vein, aspirated and flushed. A Swan-Ganz catheter was advanced into the RA, RV, PA and PCWP positions. Output was measured by thermodilution and the Fick method. The Swan-Ganz catheter was then used. A 5 Fr JL4 Catheter was then advanced of over a Standard J wire into the ascending Aorta. The catheter was used to engage the left coronary artery. Multiple cineangiographic views of the left coronary artery system(s) were performed. A 5 Fr JR4 Catheter was advanced of over a Safety J wire into the  ascending Aorta. The catheter was used to engage the right coronary artery. Multiple cineangiographic views of the right coronary artery system(s) were performed. This catheter was used to guide an exchange wire into the left ventricle and an angled Langston catheter was advanced across the Aortic Valve. LV hemodynamics were measured and simultaneous pressures across the aortic valve were  obtained. The catheter and the wire was removed completely out of the body.  The patient was transferred to the holding area where the sheath was removed with manual pressure held for hemostasis.  The patient was transported to the cath lab holding area in stable condition.  The patient was stable before, during and following the procedure.  Patient did tolerate procedure well.  There were not complications.  EBL: <10 cc  Medications:  Premedication: none  Sedation: 3 mg IV Versed, 50 IV mcg Fentanyl  Contrast: 100 cc Omnipaque  Hemodynamics:  Central Aortic Pressure / Mean Aortic Pressure: 127/80  LV Pressure / LV End diastolic Pressure: 32  Right Heart Catheterization:  RA - 10  RV - 57/13  PA - 62/24 (39)  PCWP - 16  TPG - 23  PVR - 8 wood units  PA Sat - 60%  AO Sat - 91%  FCO/CI - 5.42/2.18  TDCO/CI - 2.89/1.16  Langston Aortic Valve gradient (simultaneous) - 17 mmHg  Aortic pullback gradient - 19 mmHg  Calculated AVA - 1-1.1 cm2.  Coronary Angiographic Data:  Left Main: Calcified, no significant stenosis  Left Anterior Descending (LAD): Large vessel which coarses around the apex, mild patchy stenoses  1st diagonal (D1): Moderate sized vessel without significant stenoses  Circumflex (LCx): Large vessel that gives off 2 diagonal branches, there is up to 50% eccentric ostial stenosis.  1st obtuse marginal: There is a large OM1 vessel with a severe tubular stenosis of 90+% that extends to a bifurcation of the vessel  2nd obtuse marginal: This appears to be ostially occluded with distal filling from collaterals  3rd obtuse marginal: Small vessel which is ostially occluded.  Right Coronary Artery: There is complete occlusion of the distal RCA with TIMI II flow in the vessel. right ventricle branch of right coronary artery: There is a large RV marginal branch with 70% ostial stenosis and homocollaterals to the distal PDA/PLB system  posterior descending artery: fills via distal  collaterals from the Right and left systems  posterior lateral branch: fills via distal collaterals from the Right and left systems Impression:  1. 2 vessel CAD - apparent CTO of the distal RCA and severe ostial stenosis of a large OM1 branch.  2. Moderate aortic stenosis - mean gradient of 17 - 19 mmHG, AVA of 1.0-1.1 cm2.  3. LVEDP = 32 mmHg  4. Elevated right heart pressures with mild pulmonary venous hypertension (PA mean 39 mmHg - PCWP 16)  5. Mildly reduced cardiac output.  Plan:  1. Discussed with Dr. Claiborne Billings - given that the aortic valve is not severe and the RCA appears chronic, will proceed with intervention to the OM1 vessel to try to improve LVEF.  2. Would favor BMS due to questionable compliance issues and if surgery would be necessary to shorten the length of DAPT.  3. Will need increase in diuretics tomorrow after kidneys have recovered from contrast dye.  4. Admit post-procedure for diuresis and monitoring of PCI.  The case and results was discussed with the patient (and daughter).  The case and results was not discussed with the patient's PCP.  The case  and results was discussed with the patient's Cardiologist.  Time Spend Directly with Patient:  60 minutes  Pixie Casino, MD, [facility]  Attending Cardiologist  [facility]  06/06/2012, 10:55 AM  Cath - Interventional 05/2012 INDICATIONS: Mr. Elvis Laufer is a 68 year old African American  gentleman who underwent right and left heart cardiac catheterization  today by Dr. Debara Pickett. Please refer to Dr. Lysbeth Penner cardiac catheterization  report. In brief, the patient is a 68 year old gentleman who has a  longstanding history of diabetes mellitus, obesity, hypertension, who  had developed increasing shortness of breath. He was found to have  aortic stenosis and was scheduled for right and left heart  catheterization. At catheterization, he was found have moderate aortic  stenosis  with distal RCA occlusion after the acute margin with  collateralization to his distal RCA via the left system. His LAD had  mild disease. His circumflex vessel gave rise to an OM vessel that had  90-95% stenosis. The distal branch of this marginal 1 vessel was also  collateralized retrograde. In addition, the OM-2 vessel was occluded  with collateralization noted. After discussion with Dr. Debara Pickett, the  patient is now referred for percutaneous coronary intervention to the OM-  1 vessel.  PROCEDURE: A 6-French arterial sheath was already in place from his  diagnostic procedure. He received an additional 1 mg of Versed plus 25  mcg of fentanyl x2 for additional sedation. Angiomax bolus plus  infusion was administered. With the patient being diabetic, he received  60 mg of oral prasugrel for antiplatelet therapy. ACT was documented to  be therapeutic. A 6-French XB 3.5 guide was used. Initially, a  Prowater wire was advanced into the OM-1 vessel. Initial attempt was  made to cross the distal branch occlusion of this marginal vessel, but  this wire was unable to cross this. A 2.0 balloon was then inserted and  dilatation was done at the initial proximal OM site of 95% stenosis.  Again, further attempts were made to see if the wire would cross the  distal occlusion, but it did not, and therefore, no further attempt was  made at crossing this. Several inflations were made at the initial site  with the 2.0 x 12-mm balloon. Dr. Debara Pickett had also had discussion with  the patient's daughter, who felt the patient had a high likelihood of  noncompliance. Therefore, the decision was made to use a bare-metal  rather than drug-eluting stent. A 2.5 x 18-mm Medtronic Integrity bare-  metal stent was then inserted and dilated x2 up to 11 atmospheres. A  2.75 x 12-mm noncompliant Sprinter balloon was used for poststent  dilatation with poststent dilatation up to 2.70 mm. Scout[facility] angiography  confirmed an  excellent angiographic result with brisk TIMI-3 flow with  improved collateralization, both of the distal branches of this vessel  as well as improved collateralization to the OM-2 vessel. It did appear  that there was a long chronic occlusion to the OM-2 vessel. One brief  attempt was made to see if the wire crossed easily to the OM-2 vessel,  but since it did not, and due to the probable longstanding chronicity of  this long chronic occlusion, no further attempt was made at this. The  patient tolerated the procedure well. Plan is to continue him on  bivalirudin until the current drip runs out at full dose, which would  allow for optimal timing of prasugrel-platelet inhibition. The patient  left the catheterization  laboratory with stable hemodynamics.  HEMODYNAMIC DATA: Central aortic pressure was 125/80.  ANGIOGRAPHIC DATA: Please refer to Dr. Lysbeth Penner diagnostic cardiac  catheterization report. At the start of the intervention, the left  circumflex coronary artery had approximately 40% smooth ostial  narrowing. The vessel gave rise to a moderate-size OM-1 vessel, which  had tubular diffuse stenosis of 90-95% proximally. In the mid-distal  portion of the marginal vessel, there was then another chronic occlusion  of a more superior branch with collateralization. Following PTCA and  stenting of this proximal portion of the OM-1 vessel with insertion of a  2.5 x 18-mm Medtronic Integrity bare-metal stent, postdilated to 2.7 mm,  the 90-95% stenosis was reduced to 0%. There was brisk TIMI-3 flow, and  there was no evidence for dissection. At the completion of the  procedure, there was improved collateralization to the more distal  superior branch of this marginal 1 vessel and improved collateralization  to the OM-2 vessel.  IMPRESSION: Successful percutaneous coronary intervention to the OM-1  vessel of the left circumflex coronary artery with insertion of a 2.5 x  18-mm bare-metal Integrity  stent postdilated to 2.7 mm with a 90-95%  stenosis being reduced to 0% with improved collateralization to a distal  superior branch of this OM-1 vessel and improved collateralization to  the OM-2 vessel.  ______________________________  Shelva Majestic, M.D.    Surgical History:  Past Surgical History  Procedure Laterality Date  . Coronary angioplasty with stent placement  06/06/2012    "1"  . Lymph gland excision  07/13/2012    Procedure: CERVICAL LYMPH GLAND EXCISION;  Surgeon: Haywood Lasso, MD;  Location: [place];  Service: General;  Laterality: Right;  . Portacath placement  07/30/2012    Procedure: INSERTION PORT-A-CATH;  Surgeon: Haywood Lasso, MD;  Location: [place];  Service: General;  Laterality: Right;  . Colonoscopy N/A 05/28/2013    Procedure: COLONOSCOPY;  Surgeon: Gatha Mayer, MD;  Location: [facility];  Service: Endoscopy;  Laterality: N/A;     Home Meds: Prior to Admission medications   Medication Sig Start Date End Date Taking? Authorizing Provider  albuterol (PROVENTIL HFA;VENTOLIN HFA) 108 (90 BASE) MCG/ACT inhaler Inhale 2 puffs into the lungs every 6 (six) hours as needed for wheezing or shortness of breath. 02/04/14  Yes Biagio Borg, MD  aspirin EC 81 MG tablet Take 81 mg by mouth daily.   Yes Historical Provider, MD  atorvastatin (LIPITOR) 20 MG tablet Take 1 tablet (20 mg total) by mouth daily at 6 PM. 09/16/13  Yes Josue Hector, MD  carvedilol (COREG) 12.5 MG tablet Take 1 tablet (12.5 mg total) by mouth 2 (two) times daily. 02/25/13  Yes Thayer Headings, MD  glipiZIDE (GLUCOTROL) 10 MG tablet Take 1 tablet (10 mg total) by mouth 2 (two) times daily before a meal. 09/09/13  Yes Biagio Borg, MD  insulin glargine (LANTUS) 100 UNIT/ML injection Inject 20 Units into the skin at bedtime.   Yes Historical Provider, MD  lisinopril (PRINIVIL,ZESTRIL) 10 MG tablet Take 1 tablet (10 mg total) by mouth daily. 05/09/13  Yes Thayer Headings, MD    potassium chloride (K-DUR) 10 MEQ tablet Take 2 tablets (20 mEq total) by mouth daily. 03/28/14  Yes Luke K Kilroy, PA-C  sitaGLIPtin (JANUVIA) 100 MG tablet Take 1 tablet (100 mg total) by mouth daily. 06/26/13  Yes Biagio Borg, MD  tiotropium (SPIRIVA) 18 MCG inhalation capsule Place 18 mcg  into inhaler and inhale daily as needed. Uses for shortness of breath.   Yes Historical Provider, MD  torsemide (DEMADEX) 20 MG tablet Take 40 mg by mouth 2 (two) times daily.   Yes Historical Provider, MD  AMBULATORY NON [place] O2 @@ 2 LMP for 8 hrs at night    Historical Provider, MD  AMBULATORY NON FORMULARY MEDICATION CPAP    Historical Provider, MD  Blood Glucose Monitoring Suppl (ACCU-CHEK AVIVA PLUS) W/DEVICE KIT Use to check blood sugars twice a day Dx 250.00 02/07/14   Biagio Borg, MD  ciprofloxacin (CIPRO) 250 MG tablet Take 1 tablet (250 mg total) by mouth 2 (two) times daily. 03/20/14   Biagio Borg, MD  glucose blood (ACCU-CHEK AVIVA PLUS) test strip Use to check blood sugars twice a day Dx 250.00 03/26/14   Biagio Borg, MD  Insulin Pen Needle (BD PEN NEEDLE NANO U/F) 32G X 4 MM MISC Use as directed 1 per day   250.02 02/21/14   Biagio Borg, MD  lidocaine-prilocaine (EMLA) cream Apply 1 application topically as needed. 12/11/13   [name], MD    Allergies:  Allergies  Allergen Reactions  . Other Hives    Poison ivy.     History   Social History  . Marital Status: Single    [name]    Number of Children: 2  . Years of Education: 12   Occupational History  . retired    Social History Main Topics  . Smoking status: Former Smoker -- 1.00 packs/day for 53 years    Types: Cigarettes    Quit date: 07/19/2011  . Smokeless tobacco: Never Used  . Alcohol Use: No     Comment: Hasn't drank in over 20 yrs (former alcoholic)  . Drug Use: No  . Sexual Activity: No   Other Topics Concern  . 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   Lives by self, since '78   [name] in [place]     Regular exercise-no   Caffeine Use-no     Family History  Problem Relation Age of Onset  . Osteoarthritis Mother   . Osteoarthritis Father   . Colon cancer Daughter   . Diabetes Father     entire family  . Heart disease Father     Review of Systems: General: negative for chills, fever Cardiovascular: see above Dermatological: negative for rash Respiratory: negative for cough or wheezing Urologic: negative for hematuria Abdominal: negative for nausea, vomiting, diarrhea, bright red blood per rectum, melena, or hematemesis Neurologic: negative for visual changes, syncope, or dizziness All other systems reviewed and are otherwise negative except as noted above.  Labs:   Lab Results  Component Value Date   WBC 6.3 04/07/2014   HGB 11.0* 04/07/2014   HCT 33.6* 04/07/2014   MCV 85.7 04/07/2014   PLT 181 04/07/2014     Recent Labs Lab 04/07/14 1125  NA 140  K 3.8  CL 99  CO2 26  BUN 27*  CREATININE 1.46*  CALCIUM 9.1  PROT 7.0  BILITOT 0.8  ALKPHOS 68  ALT 13  AST 13  GLUCOSE 183*    Recent Labs  04/07/14 1125  TROPONINI <0.30   Lab Results  Component Value Date   CHOL 154 02/04/2014   HDL 35.20* 02/04/2014   LDLCALC 51 02/04/2014   TRIG 340.0* 02/04/2014   Radiology/Studies:   Dg Chest 2  View 04/07/2014   CLINICAL DATA:  Shortness of breath and weakness  EXAM: CHEST  2 VIEW  COMPARISON:  02/04/2014  FINDINGS: Cardiac shadow is mildly enlarged. The lungs are well aerated bilaterally without focal infiltrate or sizable effusion. A right-sided chest port is again seen and stable.  IMPRESSION: No acute abnormality noted.   Electronically Signed   By: Inez Catalina M.D.   On: 04/07/2014 12:21    EKG: NSR 79bpm one PVC, TWI I, avL, LAFB, no significant change from prior  Physical Exam: Blood pressure 129/65, pulse 76, temperature 97.2 F (36.2 C), resp. rate 20, height _0  (1.905  m), weight 309 lb 9 oz (140.417 kg), SpO2 98.00%. General: Well developed obese [name] in no acute distress. Head: Normocephalic, atraumatic, sclera non-icteric, no xanthomas, nares are without discharge.  Neck: Negative for carotid bruits. JVD not elevated. Lungs: Diminished BS throughout. Breathing is unlabored. Heart: RRR with S1 S2. Somewhat high pitched murmur at RUSB. No rubs or gallops appreciated. Abdomen: Soft, non-tender, non-distended with normoactive bowel sounds. No hepatomegaly. No rebound/guarding. No obvious abdominal masses. Msk:  Strength and tone appear normal for age. Extremities: No clubbing or cyanosis. 1+ BLE edema.  Distal pedal pulses in tact. Neuro: Alert and oriented X 3. No focal deficit. No facial asymmetry. Moves all extremities spontaneously. Psych:  Responds to questions appropriately with a normal affect.    ASSESSMENT AND PLAN:   1. Acute on chronic combined CHF - suspect dietary issues of excess fluid and salt precipitating volume overload. Starting to urinate from 37m IV Lasix in ED. Will start scheduled 428mIV BID starting this evening. Dietician consult. Follow volume. Given progressive dyspnea, borderline sats, and h/o CLL/lymphoma will also check d-dimer and if positive would VQ scan. If dyspnea does not improve with diuresis, consider changing Coreg to more selective BB given longstanding tobacco hx. 2. Moderate AS by echo 12/2012 - f/u echo. 3. CAD s/p BMS to [id number] with known CTO RCA - no recent CP. Continue aspirin, statin, BB. If LV function significantly worse on f/u echo or new WMA will need to consider updated ischemic workup. 4. CKD stage III - follow. 5. OSA, reports compliance with CPAP. 6. DM - continue home regimen and add SSI PRN.  Signed, DaMelina CopaA-C 04/07/2014, 4:40 PM

## 2014-04-07 NOTE — ED Notes (Signed)
Tray ordered.

## 2014-04-08 ENCOUNTER — Encounter (HOSPITAL_COMMUNITY): Payer: Self-pay | Admitting: General Practice

## 2014-04-08 DIAGNOSIS — E785 Hyperlipidemia, unspecified: Secondary | ICD-10-CM | POA: Diagnosis present

## 2014-04-08 DIAGNOSIS — I129 Hypertensive chronic kidney disease with stage 1 through stage 4 chronic kidney disease, or unspecified chronic kidney disease: Secondary | ICD-10-CM | POA: Diagnosis present

## 2014-04-08 DIAGNOSIS — Z7982 Long term (current) use of aspirin: Secondary | ICD-10-CM | POA: Diagnosis not present

## 2014-04-08 DIAGNOSIS — I359 Nonrheumatic aortic valve disorder, unspecified: Secondary | ICD-10-CM | POA: Diagnosis present

## 2014-04-08 DIAGNOSIS — E119 Type 2 diabetes mellitus without complications: Secondary | ICD-10-CM | POA: Diagnosis present

## 2014-04-08 DIAGNOSIS — Z794 Long term (current) use of insulin: Secondary | ICD-10-CM | POA: Diagnosis not present

## 2014-04-08 DIAGNOSIS — Z87891 Personal history of nicotine dependence: Secondary | ICD-10-CM | POA: Diagnosis not present

## 2014-04-08 DIAGNOSIS — N183 Chronic kidney disease, stage 3 unspecified: Secondary | ICD-10-CM | POA: Diagnosis present

## 2014-04-08 DIAGNOSIS — I509 Heart failure, unspecified: Secondary | ICD-10-CM | POA: Diagnosis present

## 2014-04-08 DIAGNOSIS — Z91199 Patient's noncompliance with other medical treatment and regimen due to unspecified reason: Secondary | ICD-10-CM | POA: Diagnosis not present

## 2014-04-08 DIAGNOSIS — I5043 Acute on chronic combined systolic (congestive) and diastolic (congestive) heart failure: Secondary | ICD-10-CM | POA: Diagnosis present

## 2014-04-08 DIAGNOSIS — Z8572 Personal history of non-Hodgkin lymphomas: Secondary | ICD-10-CM | POA: Diagnosis not present

## 2014-04-08 DIAGNOSIS — Z9119 Patient's noncompliance with other medical treatment and regimen: Secondary | ICD-10-CM | POA: Diagnosis not present

## 2014-04-08 DIAGNOSIS — I059 Rheumatic mitral valve disease, unspecified: Secondary | ICD-10-CM

## 2014-04-08 DIAGNOSIS — N5089 Other specified disorders of the male genital organs: Secondary | ICD-10-CM | POA: Diagnosis present

## 2014-04-08 DIAGNOSIS — G4733 Obstructive sleep apnea (adult) (pediatric): Secondary | ICD-10-CM | POA: Diagnosis present

## 2014-04-08 DIAGNOSIS — E669 Obesity, unspecified: Secondary | ICD-10-CM

## 2014-04-08 DIAGNOSIS — I2789 Other specified pulmonary heart diseases: Secondary | ICD-10-CM | POA: Diagnosis present

## 2014-04-08 DIAGNOSIS — I2510 Atherosclerotic heart disease of native coronary artery without angina pectoris: Secondary | ICD-10-CM | POA: Diagnosis present

## 2014-04-08 DIAGNOSIS — I252 Old myocardial infarction: Secondary | ICD-10-CM | POA: Diagnosis not present

## 2014-04-08 DIAGNOSIS — Z6830 Body mass index (BMI) 30.0-30.9, adult: Secondary | ICD-10-CM | POA: Diagnosis not present

## 2014-04-08 LAB — BASIC METABOLIC PANEL
ANION GAP: 15 (ref 5–15)
BUN: 29 mg/dL — AB (ref 6–23)
CHLORIDE: 100 meq/L (ref 96–112)
CO2: 26 meq/L (ref 19–32)
CREATININE: 1.46 mg/dL — AB (ref 0.50–1.35)
Calcium: 9.6 mg/dL (ref 8.4–10.5)
GFR calc Af Amer: 56 mL/min — ABNORMAL LOW (ref >90)
GFR calc non Af Amer: 48 mL/min — ABNORMAL LOW (ref >90)
GLUCOSE: 193 mg/dL — AB (ref 70–99)
Potassium: 3.9 mEq/L (ref 3.7–5.3)
Sodium: 141 mEq/L (ref 137–147)

## 2014-04-08 LAB — GLUCOSE, CAPILLARY
GLUCOSE-CAPILLARY: 186 mg/dL — AB (ref 70–99)
GLUCOSE-CAPILLARY: 186 mg/dL — AB (ref 70–99)
GLUCOSE-CAPILLARY: 225 mg/dL — AB (ref 70–99)
Glucose-Capillary: 251 mg/dL — ABNORMAL HIGH (ref 70–99)

## 2014-04-08 MED ORDER — INFLUENZA VAC SPLIT QUAD 0.5 ML IM SUSY
0.5000 mL | PREFILLED_SYRINGE | INTRAMUSCULAR | Status: AC
  Administered 2014-04-09: 0.5 mL via INTRAMUSCULAR
  Filled 2014-04-08: qty 0.5

## 2014-04-08 MED ORDER — PERFLUTREN LIPID MICROSPHERE
1.0000 mL | INTRAVENOUS | Status: AC | PRN
  Administered 2014-04-08: 1 mL via INTRAVENOUS
  Filled 2014-04-08: qty 10

## 2014-04-08 NOTE — Progress Notes (Signed)
*  PRELIMINARY RESULTS* Echocardiogram 2D Echocardiogram with Definity has been performed.  Leavy Cella 04/08/2014, 11:43 AM

## 2014-04-08 NOTE — Progress Notes (Signed)
UR completed 

## 2014-04-08 NOTE — H&P (Signed)
Patient's chart reviewed.  Patient personally interviewed and examined by me.  Has had progressive increase in SOB and LE edema recently.  Apparently no compliant with dietary sodium restriction or fluid restriction.  Admitted to ER with volume overload and SOB c/w acute on chronic systolic CHF.  Will admit for diuresis.  Will repeat echo to reevaluate for progression of AS since last echo a year ago.  Check D-Dimer to rule out PE although unlikely.  If SOB dose not improve with diuresis, may need to consider ischemic workup.

## 2014-04-08 NOTE — Progress Notes (Signed)
Pt. Refuses CPAP at this time stating that he is going home in the morning & that he will just wear his nasal cannula tonight. Pt. Was made aware to let RT or RN know anytime during the night if he changed his mind & decided to wear CPAP.

## 2014-04-08 NOTE — Progress Notes (Signed)
Patient Name: [name] [name] Date of Encounter: 04/08/2014     Active Problems:   CAD, CTO RCA with collat. S/P OM1 BMS 06/06/12   Acute on chronic combined systolic and diastolic congestive heart failure   Aortic stenosis, moderate by echo June 2014   Acute on chronic combined systolic and diastolic heart failure    SUBJECTIVE  Denies any CP or significant SOB. States he does not know if IV lasix is help or not as he is wearing O2  CURRENT MEDS . aspirin EC  81 mg Oral Daily  . atorvastatin  20 mg Oral q1800  . carvedilol  12.5 mg Oral BID  . furosemide  40 mg Intravenous BID  . glipiZIDE  10 mg Oral BID AC  . heparin  5,000 Units Subcutaneous 3 times per day  . insulin aspart  0-9 Units Subcutaneous TID WC  . insulin glargine  20 Units Subcutaneous QHS  . linagliptin  5 mg Oral Daily  . lisinopril  10 mg Oral Daily  . potassium chloride  20 mEq Oral Daily  . sodium chloride  3 mL Intravenous Q12H    OBJECTIVE  Filed Vitals:   04/07/14 1830 04/07/14 2212 04/07/14 2219 04/08/14 0557  BP: 159/92 146/72  135/70  Pulse: 85 84  77  Temp:   97.7 F (36.5 C) 97.5 F (36.4 C)  TempSrc:   Oral Oral  Resp: 26   22  Height:    6\' 3"  (1.905 m)  Weight:    298 lb 15.1 oz (135.6 kg)  SpO2: 100% 99%  97%    Intake/Output Summary (Last 24 hours) at 04/08/14 0746 Last data filed at 04/08/14 0557  Gross per 24 hour  Intake    240 ml  Output   1850 ml  Net  -1610 ml   Filed Weights   04/07/14 1028 04/07/14 1031 04/08/14 0557  Weight: 310 lb (140.615 kg) 309 lb 9 oz (140.417 kg) 298 lb 15.1 oz (135.6 kg)    PHYSICAL EXAM  General: Pleasant, NAD. Neuro: Alert and oriented X 3. Moves all extremities spontaneously. Psych: Normal affect. HEENT:  Normal  Neck: Supple without bruits Lungs:  Resp regular and unlabored, markedly diminished breath sound in bilateral bases Heart: RRR no s3, s4. 1/6 systolic murmur Abdomen: Soft, non-tender, BS + x 4. +severely distended  abdomen Extremities: No clubbing, cyanosis. DP/PT/Radials 2+ and equal bilaterally. 2+ pitting edema in bilateral LE, however LLE seems more swollen when compare to R side  Accessory Clinical Findings  CBC  Recent Labs  04/07/14 1125  WBC 6.3  HGB 11.0*  HCT 33.6*  MCV 85.7  PLT 093   Basic Metabolic Panel  Recent Labs  04/07/14 1125 04/08/14 0401  NA 140 141  K 3.8 3.9  CL 99 100  CO2 26 26  GLUCOSE 183* 193*  BUN 27* 29*  CREATININE 1.46* 1.46*  CALCIUM 9.1 9.6   Liver Function Tests  Recent Labs  04/07/14 1125  AST 13  ALT 13  ALKPHOS 68  BILITOT 0.8  PROT 7.0  ALBUMIN 3.7   Cardiac Enzymes  Recent Labs  04/07/14 1125  TROPONINI <0.30   D-Dimer  Recent Labs  04/07/14 2004  DDIMER 1.50*    TELE NSR with HR 70-80s, occasional PVCs, no significant ventricular ectopy    ECG  NSR with HR 70s, PVC, otherwise no significant ST-T wave changes   Echocardiogram 01/03/2013  LV EF: 35% - 40%  ------------------------------------------------------------ Indications: Cardiomyopathy -  ischemic 414.8. CHF - 428.0. Shortness of breath 786.05.  ------------------------------------------------------------ History: PMH: Acquired from the patient and from the patient's chart. Dyspnea. Coronary artery disease. Ischemic cardiomyopathy, with congestive heart failure, with an ejection fraction of 45%by echocardiography. The dysfunction is both systolic and diastolic. Moderate aortic stenosis. Chronic obstructive pulmonary disease. The patient uses continuous supplemental oxygen. The patient has lymphoma and is undergoingchemotherapy. PMH: Myocardial infarction. Risk factors: History of drug use and ETOHism. Current tobacco use. Hypertension. Diabetes mellitus. Obese. Dyslipidemia.  ------------------------------------------------------------ Study Conclusions  - Left ventricle: The cavity size was normal. Wall thickness was increased in a pattern  of mild LVH. Systolic function was moderately reduced. The estimated ejection fraction was in the range of 35% to 40%. Diffuse hypokinesis. Doppler parameters are consistent with restrictive physiology, indicative of decreased left ventricular diastolic compliance and/or increased left atrial pressure. - Aortic valve: A bicuspid morphology cannot be excluded; severely thickened, severely calcified leaflets. There was moderate stenosis. Mean gradient: 53mm Hg (S). Peak gradient: 62mm Hg (S). - Mitral valve: Mild regurgitation. - Left atrium: The atrium was mildly to moderately dilated. - Right ventricle: The cavity size was mildly dilated. - Right atrium: The atrium was mildly dilated. - Tricuspid valve: Moderate regurgitation. - Pulmonary arteries: PA peak pressure: 40mm Hg (S).      Radiology/Studies  Dg Chest 2 View  04/07/2014   CLINICAL DATA:  Shortness of breath and weakness  EXAM: CHEST  2 VIEW  COMPARISON:  02/04/2014  FINDINGS: Cardiac shadow is mildly enlarged. The lungs are well aerated bilaterally without focal infiltrate or sizable effusion. A right-sided chest port is again seen and stable.  IMPRESSION: No acute abnormality noted.   Electronically Signed   By: Inez Catalina M.D.   On: 04/07/2014 12:21   US Scrotum  03/21/2014   CLINICAL DATA:  New onset of a scrotal swelling bilaterally  EXAM: ULTRASOUND OF SCROTUM  TECHNIQUE: Complete ultrasound examination of the testicles, epididymis, and other scrotal structures was performed.  COMPARISON:  CT abdomen pelvis of 10/02/2013  FINDINGS: Right testicle  Measurements: The right testicle measures 4.4 x 3.2 x 3.0 cm. Blood flow is demonstrated to the right testicle. No intratesticular abnormality is seen.  Left testicle  Measurements: The left testicle measures 3.4 x 2.5 x 3.1 cm. No intratesticular abnormality is seen and blood flow is unremarkable.  Right epididymis: The right epididymis is prominent and there is increased blood  flow which may indicate right epididymitis. Clinical correlation is recommended.  Left epididymis:  The left epididymis is unremarkable.  Hydrocele:  Only a small amount of fluid is noted on the right.  Varicocele:  No varicocele is seen.  However, there is a solid somewhat inhomogeneous nodule just above the right epididymis of 4.6 x 3.7 x 4.1 cm. This probably represents a spurring granuloma. Has the patient had vasectomy? There is some blood flow within this nodule which is somewhat atypical, although extra testicular lesions are largely benign in origin. If the patient has not had previous vasectomy, then followup ultrasound would be recommended.  IMPRESSION: 1. Prominent right epididymis with increased blood flow most consistent with right epididymitis. Correlate clinically. 2. No intratesticular abnormality is seen. Blood flow is demonstrated to both testicles. 3. Solid nodule just above the right epididymis may represent a sperm granuloma. Has the patient undergone prior vasectomy? If not consider followup ultrasound to assess stability.   Electronically Signed   By: Ivar Drape M.D.   On: 03/21/2014 16:40  ASSESSMENT AND PLAN  1. Acute on chronic combined CHF  - dietary indiscretion, drink "a lot of " fluid. Try to avoid salt, but does eat canned food   - EF 35-40% 12/2012  - pending repeat Echo today, continue IV lasix (still has 2+ pitting edema in LE)  2. Moderate AS by echo 3. CAD s/p BMS to [place] 2013 with known CTO RCA 4. CKD stage III 5. OSA, compliant with CPAP 6. DM  7. H/o chronic lymphocytic leukemia/small lymphocytic lymphoma   - diagnosed in the past 1-2 yrs, s/p chemo earlier this yr  44. Elevated d-dimer 1.5  - consider LE doppler vs CTA of chest  - does have more swelling over LLE than R side, however states it is chronic. No calf tenderness.    Hilbert Corrigan PA-C Pager: [phone] Patient seen and examined and history reviewed. Agree with above findings and plan.  Patient reports that his breathing is much better. Denies chest pain. On exam BS are diminished at bases. There is a grade 1-2 systolic ejection murmur RUSB. There is 1+ edema. Patient has diuresed > 1 liter. D-dimer is mildly elevated. Clinical picture is of acute on chronic CHF. Probability of PE is relatively low. Will check venous doppler. I would like to avoid contrast with CKD. Continue IV diuresis. Stressed importance of sodium and fluid restriction. Will follow up Echo today. Monitor renal indices closely. Continue Coreg and ACEi.   Arriyanna Mersch [place], [place] 04/08/2014 10:01 AM

## 2014-04-08 NOTE — Progress Notes (Signed)
Inpatient Diabetes Program Recommendations  AACE/ADA: New Consensus Statement on Inpatient Glycemic Control (2013)  Target Ranges:  Prepandial:   less than 140 mg/dL      Peak postprandial:   less than 180 mg/dL (1-2 hours)      Critically ill patients:  140 - 180 mg/dL   Inpatient Diabetes Program Recommendations HgbA1C: last A1C=18.5  This coordinator met with patient to discuss glycemic management at home. Patient reports that he knows he needs to lose weight and plans to do so.  Discussed basic carbohydrates and diabetes meal planning.  No questions/concerns at the end of our visit. Thank you  Raoul Pitch BSN, RN,CDE Inpatient Diabetes Coordinator [phone] (team pager)

## 2014-04-09 DIAGNOSIS — I5043 Acute on chronic combined systolic (congestive) and diastolic (congestive) heart failure: Principal | ICD-10-CM

## 2014-04-09 DIAGNOSIS — R0602 Shortness of breath: Secondary | ICD-10-CM

## 2014-04-09 DIAGNOSIS — N289 Disorder of kidney and ureter, unspecified: Secondary | ICD-10-CM

## 2014-04-09 DIAGNOSIS — R791 Abnormal coagulation profile: Secondary | ICD-10-CM

## 2014-04-09 LAB — BASIC METABOLIC PANEL
ANION GAP: 15 (ref 5–15)
BUN: 29 mg/dL — ABNORMAL HIGH (ref 6–23)
CO2: 25 mEq/L (ref 19–32)
CREATININE: 1.47 mg/dL — AB (ref 0.50–1.35)
Calcium: 9.7 mg/dL (ref 8.4–10.5)
Chloride: 100 mEq/L (ref 96–112)
GFR calc non Af Amer: 48 mL/min — ABNORMAL LOW (ref >90)
GFR, EST AFRICAN AMERICAN: 55 mL/min — AB (ref >90)
Glucose, Bld: 193 mg/dL — ABNORMAL HIGH (ref 70–99)
Potassium: 4.1 mEq/L (ref 3.7–5.3)
Sodium: 140 mEq/L (ref 137–147)

## 2014-04-09 LAB — GLUCOSE, CAPILLARY
Glucose-Capillary: 152 mg/dL — ABNORMAL HIGH (ref 70–99)
Glucose-Capillary: 166 mg/dL — ABNORMAL HIGH (ref 70–99)
Glucose-Capillary: 175 mg/dL — ABNORMAL HIGH (ref 70–99)
Glucose-Capillary: 204 mg/dL — ABNORMAL HIGH (ref 70–99)
Glucose-Capillary: 225 mg/dL — ABNORMAL HIGH (ref 70–99)

## 2014-04-09 NOTE — Progress Notes (Signed)
*  PRELIMINARY RESULTS* Vascular Ultrasound Lower extremity venous duplex has been completed.  Preliminary findings: no evidence of DVT  Landry Mellow, RDMS, RVT  04/09/2014, 12:57 PM

## 2014-04-09 NOTE — Progress Notes (Signed)
Heart Failure Navigator Consult Note  Presentation: [name] [name] is a 68 y/o M with history of CAD (cath 05/2012 with CTO of RCA; s/p BMS to severe OM-1 stenosis), chronic combined CHF (EF 35-40% 12/2012), moderate AS by echo 12/2012, chronic multifactorial dyspnea, CKD stage III, pulm HTN, morbid obesity, OSA, chronic lymphocytic leukemia/small lymphocytic lymphoma with complete response to chemo, DM, and h/o dietary noncompliance who presents to [facility] with symptoms of volume overload. He recently saw Dr. Jenny Reichmann for scrotal edema and weight up to 310. US showed epididymitis. Demadex was increased from 20mg  BID to 40mg  BID. He was seen in clinic by Kerin Ransom PA-C on 03/27/14 at which time he was still felt to be volume overloaded so the higher dose was continued (down to 306 at that visit but still felt to be volume overloaded). He was supposed to f/u back in clinic today but there was a misunderstanding about his appointment time. When he showed up he was offered an appointment later this afternoon but after describing increased dyspnea, he was given the option to proceed to the ED. He reports worsening sx since his appointment with increasing LEE and DOE. He's had DOE since 2013 but feels it's occurring with progressively less activity now, which limits his activity level further. He denies SOB at rest. Scrotal edema has been improving but still present. Weight back up to 309. No chest pain, orthopnea, syncope, abdominal swelling, bleeding. Compliant with meds. He says he never heard he had to fluid restrict before - drinks "a whole lot " - notes maybe 4 16-oz bottles of water per day. Tries to avoid salt but does acknowledge that he eats it if its already in things, ie canned tuna. O2 sat 92% RA.   Past Medical History  Diagnosis Date  . Ischemic cardiomyopathy   . Type II diabetes mellitus   . Hypertension   . Hypercholesterolemia   . Lower GI bleeding 1980's    "when I was drinking  alot" (06/06/2012)  . Arthritis     "both shoulders" (06/06/2012)  . Chronic combined systolic and diastolic CHF (congestive heart failure) 07/10/2012    a. EF 97-98%, + diastolic dysfunction by echo 12/2012.  Marland Kitchen GERD (gastroesophageal reflux disease) 07/12/2012  . OSA on CPAP     uses a cpap  . Myocardial infarction 11/13  . Coronary artery disease     a. Cath 05/2012 with CTO of RCA; s/p BMS to severe OM-1 stenosis.  Marland Kitchen History of alcoholism   . Sleep apnea   . Moderate aortic stenosis     a. Mod by echo 12/2012.  Marland Kitchen Pancreatitis   . Diverticulosis   . Personal history of rectal adenoma 05/28/2013  . Dietary noncompliance   . Morbid obesity   . Lymphoma     a. 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  . Shortness of breath     History   Social History  . Marital Status: Single    [name]    Number of Children: 2  . Years of Education: 12   Occupational History  . retired    Social History Main Topics  . Smoking status: Former Smoker -- 1.00 packs/day for 53 years    Types: Cigarettes    Quit date: 07/18/2013  . Smokeless tobacco: Never Used  . Alcohol Use: No     Comment: Hasn't drank in over 20 yrs (former alcoholic)  . Drug  Use: No  . Sexual Activity: No   Other Topics Concern  . 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   Lives by self, since '78   [name] in [place]     Regular exercise-no   Caffeine Use-no    ECHO:Study Conclusions--04/08/14  - Left ventricle: The cavity size was normal. There was mild focal basal hypertrophy of the septum. Systolic function was normal. The estimated ejection fraction was in the range of 55% to 60%. Wall motion was normal; there were no regional wall motion abnormalities. Doppler parameters are consistent with abnormal left ventricular relaxation (grade 1 diastolic dysfunction). Doppler  parameters are consistent with high ventricular filling pressure. - Aortic valve: Cusp separation was reduced. There was at least moderate stenosis morphologically. Prior echo with peak velocity 3.43m/s. - Mitral valve: There was mild regurgitation. - Left atrium: The atrium was mildly dilated.  Impressions:  - When compared to prior, EF is improved.  Transthoracic echocardiography. M-mode, complete 2D, spectral Doppler, and color Doppler. Birthdate: Patient birthdate: Oct 30, 1945. Age: Patient is 68 yr old. Sex: Gender: male. BMI: 37.2 kg/m^2. Blood pressure: 118/48 Patient status: Inpatient. Study date: Study date: 04/08/2014. Study time: 10:36 AM. Location: Echo laboratory.   BNP    Component Value Date/Time   PROBNP 1016.0* 04/07/2014 1125    Education Assessment and Provision:  Detailed education and instructions provided on heart failure disease management including the following:  Signs and symptoms of Heart Failure When to call the physician Importance of daily weights Low sodium diet Fluid restriction Medication management Anticipated future follow-up appointments  Patient education given on each of the above topics.  Patient acknowledges understanding and acceptance of all instructions.  I spoke for a while with [name] [name] regarding his HF.  He does have a scale at home however he does say that it will not accommodate his current weight.  He has requested a new one through [facility] that goes to 450lbs and says that it may have been delivered already.  He claims that he is very careful with diet at home.  He recalls mainly fish and fresh vegetables.  He does admit that he has been drinking a lot of water--because he thought that "it was the right thing".  He admits no issues with medications or taking medications.  He lives alone with little outside support.  He does have a neighbor he can call if needed.  Education Materials:  "Living Better With Heart Failure" Booklet,  Daily Weight Tracker Tool .   High Risk Criteria for Readmission and/or Poor Patient Outcomes:   EF <30%- No--55-60%  2 or more admissions in 6 months- No  Difficult social situation- Yes--lives alone--little other support  Demonstrates medication noncompliance- No    Barriers of Care:  Knowledge, compliance   Discharge Planning:   Plans to discharge to home alone.  He would greatly benefit from [facility] to continue teaching and reinforce daily weights (insure that he gets appropriate scale).  He will also need a quick outpatient appt to evaluate fluid status.

## 2014-04-09 NOTE — Progress Notes (Signed)
Patient evaluated for community based chronic disease management services with [facility] as a benefit of patient's [employer]. Spoke with patient at bedside to explain [facility] services.  Services have been accepted with written consent.  Will assign a RN Care Coordinator ([facility] [facility]) for CHF education.  Patient has ordered a scale from [facility] that goes up to 450 pounds.  May benefit from fluid volume management education.  Patient has declined home visits and the CHF clinic but will allow transition of care calls.  His authorized contact is his daughter Creola Corn [phone].  Patient will receive a post discharge transition of care call and will be evaluated for monthly home visits for assessments and disease process education.  Left contact information and [facility] literature at bedside. Made Inpatient Case Manager aware that [facility] following. Of note, [facility] Management services does not replace or interfere with any services that are arranged by inpatient case management or social work.  For additional questions or referrals please contact Corliss Blacker BSN RN [facility] Liaison at [phone].

## 2014-04-09 NOTE — Progress Notes (Signed)
Inpatient Diabetes Program Recommendations  AACE/ADA: New Consensus Statement on Inpatient Glycemic Control (2013)  Target Ranges:  Prepandial:   less than 140 mg/dL      Peak postprandial:   less than 180 mg/dL (1-2 hours)      Critically ill patients:  140 - 180 mg/dL    Results for BERTRAM, HADDIX (MRN [id number]) as of 04/09/2014 12:51  Ref. Range 04/08/2014 06:20 04/08/2014 11:45 04/08/2014 16:47 04/08/2014 20:53  Glucose-Capillary Latest Range: 70-99 mg/dL 186 (H) 186 (H) 225 (H) 251 (H)    Results for WILLIOM, CEDAR (MRN [id number]) as of 04/09/2014 12:51  Ref. Range 04/09/2014 05:45 04/09/2014 11:45  Glucose-Capillary Latest Range: 70-99 mg/dL 204 (H) 225 (H)     Patient eating 100% of meals.  Having glucose elevations.    MD- Please consider the following to improve CBG control:  1. Increase Lantus to 25 units QHS 2. Add Novolog Meal Coverage- Novolog 4 units tid with meals    Will follow Wyn Quaker RN, MSN, CDE Diabetes Coordinator Inpatient Diabetes Program Team Pager: [phone] (8a-10p)

## 2014-04-09 NOTE — Discharge Instructions (Signed)
Sodium and Fluid Restriction Some health conditions may require you to restrict your sodium and fluid intake. Sodium is part of the salt in the blood. Sodium may be restricted because when you take in a lot of salt, you become thirsty. Limiting salt with help you become less thirsty and may make it easier to restrict fluid. Talk to your caregiver or dietician about how many cups of fluid and how many milligrams of sodium you are allowed each day. If your caregiver has restricted your sodium and fluids, usually the amount you can drink depends on several things, such as:  Your urine output.  How much fluid you are retaining.  Your blood pressure. Every 2 cups (500 mL) of fluid retained in the body becomes an extra 1 pound (0.5 kg) of body weight. The following are examples of some fluids you will have to restrict:  Tea, coffee, soda, lemonade, milk, water, and juice.  Alcoholic beverages.  Cream.  Gravy.  Ice cubes.  Soup and broth. The following are foods that become liquid at room temperature. These foods will count towards your fluid intake.  Ice cream and ice milk.  Frozen yogurt and sherbet.  Frozen ice pops.  Flavored gelatin. YOU MAY BE TAKING IN TOO MUCH FLUID IF:  Your weight increases.  Your face, hands, legs, feet, and abdomen start to swell.  You have trouble breathing. HOME CARE INSTRUCTIONS If you follow a low sodium diet closely, you will eat approximately 1,500 mg of sodium a day.   Avoid salty foods. This increases your thirst and makes fluid control more difficult. Foods high in sodium include:  Most canned foods, including most meats.  Most processed foods, including most meats.  Cheese.  Dried pasta and rice mixes.  Snack foods (chips, popcorn, pretzels, cheese puffs, salted nuts).  Dips, sauces, and salad dressings.  Do not use salt in cooking or add salt to your meal. Cook with herbs and spices, but not those that have salt in the name. Ask  your caregiver if it is okay to use salt substitutes.  Eat home-prepared meals. Use fresh ingredients. Avoid canned, frozen, or packaged meals.  Read food labels to see how much sodium is in the food. Know how much sodium you are allowed each day.  When eating out, ask for dressings and sauces on the side.  Weigh yourself every morning with an empty bladder before you eat or drink. If your weight is going up, you are retaining fluid.  Freeze fruit juice or water in an ice cube tray. Use this as part of your fluid allowance.  Brush your teeth often or rinse your mouth with mouthwash to help your dry mouth. Lemon wedges, hard sour candies, chewing gum, or breath spray may help to moisten your mouth too.  Add a slice of fresh lemon or lemon juice to water or ice. This helps satisfy your thirst.  Try frozen fruits between meals, such as grapes or strawberries.  Swallow your pills along with meals or soft foods. This helps you save your fluid for something you enjoy.  Use small cups and glasses and learn to sip fluids slowly.  Keep your home cooler. Keep the air in your home as humid as possible. Dry air increases thirst.  Avoid being out in the hot sun. Each morning, fill a jug with the amount of water you are allowed for the day. You can use this water as a guideline for fluid allowance. Each time you take in   fluid, pour an equal amount of water out of the container. This helps you to see how much fluid you are taking in. It also helps plan your fluid intake for the rest of the day. CONVERSIONS TO HELP MEASURE FLUID INTAKE  1 cup equals 8 oz (240 mL).   cup equals 6 oz (180 mL).   cup equals 5  oz (160 mL).   cup equals 4 oz (120 mL).   cup equals 2  oz (80 mL).   cup equals 2 oz (60 mL).  2 tbs equals 1 oz (30 mL). Document Released: 05/01/2007 Document Revised: 01/03/2012 Document Reviewed: 12/16/2011 ExitCare Patient Information 2015 [employer]. This information is  not intended to replace advice given to you by your health care provider. Make sure you discuss any questions you have with your health care provider.  

## 2014-04-09 NOTE — Progress Notes (Signed)
Subjective:  Less SOB, scrotal edema is improved.  Objective:  Vital Signs in the last 24 hours: Temp:  [97.3 F (36.3 C)-98.4 F (36.9 C)] 97.4 F (36.3 C) (09/23 0500) Pulse Rate:  [73-87] 76 (09/23 0500) Resp:  [18-20] 18 (09/23 0500) BP: (98-132)/(33-75) 108/52 mmHg (09/23 0500) SpO2:  [95 %-100 %] 95 % (09/23 0500) Weight:  [297 lb 13.5 oz (135.1 kg)] 297 lb 13.5 oz (135.1 kg) (09/23 0500)  Intake/Output from previous day:  Intake/Output Summary (Last 24 hours) at 04/09/14 0839 Last data filed at 04/09/14 0518  Gross per 24 hour  Intake    720 ml  Output   2750 ml  Net  -2030 ml    Physical Exam: General appearance: alert, cooperative, no distress and morbidly obese Lungs: decreased breath sounds, no rales Heart: regular rate and rhythm and 2/6 systolic murmur AOV Extremities: 2+ LE edema   Rate: 76  Rhythm: normal sinus rhythm  Lab Results:  Recent Labs  04/07/14 1125  WBC 6.3  HGB 11.0*  PLT 181    Recent Labs  04/08/14 0401 04/09/14 0500  NA 141 140  K 3.9 4.1  CL 100 100  CO2 26 25  GLUCOSE 193* 193*  BUN 29* 29*  CREATININE 1.46* 1.47*    Recent Labs  04/07/14 1125  TROPONINI <0.30   No results found for this basename: INR,  in the last 72 hours  Imaging: Dg Chest 2 View  04/07/2014   CLINICAL DATA:  Shortness of breath and weakness  EXAM: CHEST  2 VIEW  COMPARISON:  02/04/2014  FINDINGS: Cardiac shadow is mildly enlarged. The lungs are well aerated bilaterally without focal infiltrate or sizable effusion. A right-sided chest port is again seen and stable.  IMPRESSION: No acute abnormality noted.   Electronically Signed   By: Inez Catalina M.D.   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 He recently saw Dr. Jenny Reichmann for scrotal edema and weight up to 310. His diuretics were adjusted as an OP but he continues to have scrotal edema and dyspnea. He was admitted 04/07/14 for diuresis. He says he weighed 267  Nov 2014.    Principal Problem:   Acute on chronic combined systolic and diastolic congestive heart failure Active Problems:   CAD, CTO RCA with collat. S/P OM1 BMS 06/06/12   AS, moderate by echo 04/08/14- EF 55-60%   Chronic renal insufficiency, stage III (moderate)   HTN (hypertension)   Diabetes mellitus, Type 2 NIDDM   Dyslipidemia, LDL 105   Smoker   Scrotal swelling   Obesity (BMI 30-39.9)    PLAN: He is diuresing on IV Lasix and improved symptomatically. Renal function is stable. Wgt down 13 lbs so far. He still has at least another 10-15 lbs to go. BNP was only 1016 on admission so probably not a reliable marker in this pt.   Kerin Ransom PA-C Beeper [phone] 04/09/2014, 8:39 AM   Patient seen and examined and history reviewed. Agree with above findings and plan. Patient is diuresing well on IV lasix. He is still volume  overloaded with 2+ edema. He is upset and wants to be DC today. I encouraged him to stay at least one more day so we can get him closer to dry weight. He is agreeable.   Daniyla Pfahler [place], [place] 04/09/2014 1:12 PM

## 2014-04-10 DIAGNOSIS — I10 Essential (primary) hypertension: Secondary | ICD-10-CM

## 2014-04-10 LAB — BASIC METABOLIC PANEL
Anion gap: 13 (ref 5–15)
BUN: 30 mg/dL — ABNORMAL HIGH (ref 6–23)
CHLORIDE: 101 meq/L (ref 96–112)
CO2: 27 meq/L (ref 19–32)
Calcium: 9.9 mg/dL (ref 8.4–10.5)
Creatinine, Ser: 1.48 mg/dL — ABNORMAL HIGH (ref 0.50–1.35)
GFR calc Af Amer: 55 mL/min — ABNORMAL LOW (ref >90)
GFR calc non Af Amer: 47 mL/min — ABNORMAL LOW (ref >90)
GLUCOSE: 141 mg/dL — AB (ref 70–99)
POTASSIUM: 4 meq/L (ref 3.7–5.3)
Sodium: 141 mEq/L (ref 137–147)

## 2014-04-10 LAB — GLUCOSE, CAPILLARY
GLUCOSE-CAPILLARY: 156 mg/dL — AB (ref 70–99)
Glucose-Capillary: 171 mg/dL — ABNORMAL HIGH (ref 70–99)

## 2014-04-10 MED ORDER — ACETAMINOPHEN 325 MG PO TABS: 650.0000 mg | ORAL_TABLET | ORAL | Status: DC | PRN

## 2014-04-10 NOTE — Progress Notes (Signed)
Pt continuing to refuse CPAP. 

## 2014-04-10 NOTE — Progress Notes (Signed)
Home discharge instructions and discharge medications discussed with patient. Discussed diet, activity, follow up appointments, and medications. Patient able to verbalize understanding by using teachback.

## 2014-04-10 NOTE — Discharge Summary (Signed)
Patient seen and examined and history reviewed. Agree with above findings and plan. See earlier rounding note.  [name] [name], MDFACC 04/10/2014 3:14 PM    

## 2014-04-10 NOTE — Progress Notes (Signed)
Pt d/c to home at 1313.  Transported off floor via w/c.  Karie Kirks, Therapist, sports.

## 2014-04-10 NOTE — Progress Notes (Signed)
Subjective:  Dyspnea improved.   Objective:  Vital Signs in the last 24 hours: Temp:  [97.6 F (36.4 C)-99 F (37.2 C)] 99 F (37.2 C) (09/24 0534) Pulse Rate:  [72-81] 72 (09/24 0534) Resp:  [18-19] 19 (09/24 0534) BP: (106-149)/(52-109) 106/52 mmHg (09/24 0534) SpO2:  [92 %-98 %] 92 % (09/24 0534) Weight:  [295 lb 10.2 oz (134.1 kg)] 295 lb 10.2 oz (134.1 kg) (09/24 0534)  Intake/Output from previous day:  Intake/Output Summary (Last 24 hours) at 04/10/14 3009 Last data filed at 04/09/14 2221  Gross per 24 hour  Intake    960 ml  Output   1825 ml  Net   -865 ml    Physical Exam: General appearance: alert, cooperative, no distress and morbidly obese Lungs: clear to auscultation bilaterally Heart: regular rate and rhythm and 2/6 systolic murmur AOV Extremities: 1-2+ LE edema   Rate: 76  Rhythm: normal sinus rhythm  Lab Results:  Recent Labs  04/07/14 1125  WBC 6.3  HGB 11.0*  PLT 181    Recent Labs  04/09/14 0500 04/10/14 0531  NA 140 141  K 4.1 4.0  CL 100 101  CO2 25 27  GLUCOSE 193* 141*  BUN 29* 30*  CREATININE 1.47* 1.48*    Recent Labs  04/07/14 1125  TROPONINI <0.30   No results found for this basename: INR,  in the last 72 hours  Imaging: Imaging results have been reviewed  Cardiac Studies:  Assessment/Plan:  68 y/o M with history of CAD- (cath 05/2012 with CTO of RCA; s/p BMS to OM-1), chronic combined CHF- (EF 35% in June 2014- 55-60% 04/08/14), chronic multifactorial dyspnea, CKD stage III, pulm HTN, morbid obesity, OSA on C-pap, 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 He recently saw Dr. Jenny Reichmann for scrotal edema and weight up to 310. His diuretics were adjusted as an OP but he continues to have scrotal edema and dyspnea. He was admitted 04/07/14 for diuresis. He says he weighed 267 Nov 2014.  He admits he was drinking  lots of fluids at home. He has diuresed 15 lbs.  He refused C-pap last night but says he uses it daily at home. Yesterday he wanted to leave but Dr [place] convinced him to stay for one more day of IV Lasix. Compliance will be an ongoing issue with Mr Meints.    Principal Problem:   Acute on chronic combined systolic and diastolic congestive heart failure Active Problems:   CAD, CTO RCA with collat. S/P OM1 BMS 06/06/12   AS, moderate by echo 04/08/14- EF 55-60%   Chronic renal insufficiency, stage III (moderate)   HTN (hypertension)   Diabetes mellitus, Type 2 NIDDM   Dyslipidemia, LDL 105   Smoker   Scrotal swelling   Obesity (BMI 30-39.9)    PLAN: OK for discharge today, resume home dose Demadex 40 mg BID. He has an apt with Dr Acie Fredrickson 10/5. He was seen by the CHF RN yesterday. Case management consult ordered for [facility].   Kerin Ransom PA-C Beeper [phone] 04/10/2014, 8:38 AM   Patient seen and examined and history reviewed. Agree with above findings and plan. Patient is feeling well. Ready to go home. Lost another 2 lbs yesterday. Still has 1-2+edema. Instructed on sodium and fluid restriction. Follow up with Dr. Acie Fredrickson on October 5 for transition of care visit. Stressed importance of compliance with medication.  Colman Birdwell [place], [place] 04/10/2014 9:57 AM

## 2014-04-10 NOTE — Discharge Summary (Signed)
Patient ID: [name] [name],  MRN: [id number], DOB/AGE: 1945-12-05 68 y.o.  Admit date: 04/07/2014 Discharge date: 04/10/2014  Primary Care Provider: Cathlean Cower, MD Primary Cardiologist: Dr Acie Fredrickson  Discharge Diagnoses Principal Problem:   Acute on chronic combined systolic and diastolic congestive heart failure Active Problems:   CAD, CTO RCA with collat. 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 He recently saw Dr. Jenny Reichmann for scrotal edema and weight up to 310. His diuretics were adjusted as an OP but he continues to have scrotal edema and dyspnea. He was admitted 04/07/14 for diuresis. He says he weighed 267 Nov 2014. He admits he was drinking lots of fluids at home. He has diuresed 15 lbs since admission. He refused C-pap in the hospital but says he uses it daily at home. The pt was seen by CHF RN and OP HHRN follow up was arranged. He has a follow up apt with Dr Acie Fredrickson on Oct 5 th. He will require a TCM phone call in 24-48 hrs.    Discharge Vitals:  Blood pressure 106/52, pulse 72, temperature 99 F (37.2 C), temperature source Oral, resp. rate 19, height $RemoveBe'6\' 3"'ZwEErpuJh$  (1.905 m), weight 295 lb 10.2 oz (134.1 kg), SpO2 92.00%.    Labs: Results for orders placed during the hospital encounter of 04/07/14 (from the past 24 hour(s))  GLUCOSE, CAPILLARY     Status: Abnormal   Collection Time    04/09/14 11:45 AM      Result Value Ref Range   Glucose-Capillary 225 (*) 70 - 99 mg/dL  GLUCOSE, CAPILLARY     Status: Abnormal   Collection Time    04/09/14  4:45 PM      Result Value Ref Range   Glucose-Capillary 166 (*) 70 - 99 mg/dL  GLUCOSE, CAPILLARY     Status: Abnormal   Collection Time    04/09/14  9:57 PM      Result Value Ref Range   Glucose-Capillary 152 (*) 70 - 99 mg/dL   Comment 1 Notify RN    BASIC METABOLIC PANEL     Status: Abnormal   Collection Time    04/10/14  5:31 AM      Result Value Ref Range   Sodium 141  137 - 147 mEq/L   Potassium 4.0  3.7 - 5.3 mEq/L   Chloride 101  96 - 112 mEq/L   CO2 27  19 - 32 mEq/L   Glucose, Bld 141 (*) 70 - 99 mg/dL   BUN 30 (*) 6 - 23 mg/dL   Creatinine, Ser 1.48 (*) 0.50 - 1.35 mg/dL   Calcium 9.9  8.4 - 10.5 mg/dL   GFR calc non Af Amer 47 (*) >90 mL/min   GFR calc Af Amer 55 (*) >90 mL/min   Anion gap 13  5 - 15  GLUCOSE, CAPILLARY  Status: Abnormal   Collection Time    04/10/14  6:14 AM      Result Value Ref Range   Glucose-Capillary 156 (*) 70 - 99 mg/dL    Disposition:  Follow-up Information   Follow up with Darden Amber., MD On 04/21/2014. (Kepp previously scheduled appontment Oct 5th at 3pm)    Specialty:  Cardiology   Contact information:   [address] [address] [phone]       Discharge Medications:    Medication List    STOP taking these medications       ciprofloxacin 250 MG tablet  Commonly known as:  CIPRO      TAKE these medications       ACCU-CHEK AVIVA PLUS W/DEVICE Kit  - Use to check blood sugars twice a day  - Dx 250.00     acetaminophen 325 MG tablet  Commonly known as:  TYLENOL  Take 2 tablets (650 mg total) by mouth every 4 (four) hours as needed for headache or mild pain.     albuterol 108 (90 BASE) MCG/ACT inhaler  Commonly known as:  PROVENTIL HFA;VENTOLIN HFA  Inhale 2 puffs into the lungs every 6 (six) hours as needed for  wheezing or shortness of breath.     AMBULATORY NON FORMULARY MEDICATION  Home O2 @@ 2 LMP for 8 hrs at night     AMBULATORY NON FORMULARY MEDICATION  CPAP     aspirin EC 81 MG tablet  Take 81 mg by mouth daily.     atorvastatin 20 MG tablet  Commonly known as:  LIPITOR  Take 1 tablet (20 mg total) by mouth daily at 6 PM.     carvedilol 12.5 MG tablet  Commonly known as:  COREG  Take 1 tablet (12.5 mg total) by mouth 2 (two) times daily.     glipiZIDE 10 MG tablet  Commonly known as:  GLUCOTROL  Take 1 tablet (10 mg total) by mouth 2 (two) times daily before a meal.     glucose blood test strip  Commonly known as:  ACCU-CHEK AVIVA PLUS  - Use to check blood sugars twice a day  - Dx 250.00     Insulin Pen Needle 32G X 4 MM Misc  Commonly known as:  BD PEN NEEDLE NANO U/F  Use as directed 1 per day   250.02     LANTUS 100 UNIT/ML injection  Generic drug:  insulin glargine  Inject 20 Units into the skin at bedtime.     lidocaine-prilocaine cream  Commonly known as:  EMLA  Apply 1 application topically as needed.     lisinopril 10 MG tablet  Commonly known as:  PRINIVIL,ZESTRIL  Take 1 tablet (10 mg total) by mouth daily.     potassium chloride 10 MEQ tablet  Commonly known as:  K-DUR  Take 2 tablets (20 mEq total) by mouth daily.     sitaGLIPtin 100 MG tablet  Commonly known as:  JANUVIA  Take 1 tablet (100 mg total) by mouth daily.     tiotropium 18 MCG inhalation capsule  Commonly known as:  SPIRIVA  Place 18 mcg into inhaler and inhale daily as needed. Uses for shortness of breath.     torsemide 20 MG tablet  Commonly known as:  DEMADEX  Take 40 mg by mouth 2 (two) times daily.         Duration of Discharge Encounter: Greater than 30 minutes including physician time.  Angelena Form PA-C 04/10/2014 8:58 AM

## 2014-04-11 ENCOUNTER — Telehealth: Payer: Self-pay | Admitting: Cardiology

## 2014-04-11 ENCOUNTER — Telehealth: Payer: Self-pay | Admitting: *Deleted

## 2014-04-11 NOTE — Telephone Encounter (Signed)
Called pt concerning discharge from [place] no answer LMOM RTC...[name] [name]

## 2014-04-11 NOTE — Telephone Encounter (Signed)
TCM call- pt doing well. No problems with his medications. He will keep follow up appointment.  Kerin Ransom PA-C 04/11/2014 1:03 PM

## 2014-04-11 NOTE — Telephone Encounter (Signed)
Transition Care Management Follow-up Telephone Call D/C 04/10/14  How have you been since you were released from the hospital? Pt return call back he stated he is feeling ok   Do you understand why you were in the hospital? YES he understood why he was admitted  Do you understand the discharge instrcutions? YES  Items Reviewed:  Medications reviewed: YES, reviewed medications verified he stop the antibiotic cipro & he has  Allergies reviewed: YES, reviewed  Dietary changes reviewed: Diabetic diet Referrals reviewed: No referral needed  Functional Questionnaire:   Activities of Daily Living (ADLs):   He states he is  independent in the following:bathing, feeding, getting around  States he doesn't require assistance   Any transportation issues/concerns?: NO   Any patient concerns?  NO concerns   Confirmed importance and date/time of follow-up visits scheduled: YES, made appt for 04/17/14 with Dr. Jenny Reichmann   Confirmed with patient if condition begins to worsen call PCP or go to the ER.  Patient was given the Call-a-Nurse line [phone]: Yes

## 2014-04-14 ENCOUNTER — Other Ambulatory Visit (HOSPITAL_BASED_OUTPATIENT_CLINIC_OR_DEPARTMENT_OTHER): Payer: Commercial Managed Care - HMO

## 2014-04-14 DIAGNOSIS — C9110 Chronic lymphocytic leukemia of B-cell type not having achieved remission: Secondary | ICD-10-CM

## 2014-04-14 DIAGNOSIS — C8590 Non-Hodgkin lymphoma, unspecified, unspecified site: Secondary | ICD-10-CM

## 2014-04-14 LAB — CBC WITH DIFFERENTIAL/PLATELET
BASO%: 1.1 % (ref 0.0–2.0)
Basophils Absolute: 0.1 10*3/uL (ref 0.0–0.1)
EOS%: 4.7 % (ref 0.0–7.0)
Eosinophils Absolute: 0.3 10*3/uL (ref 0.0–0.5)
HCT: 33.8 % — ABNORMAL LOW (ref 38.4–49.9)
HGB: 11 g/dL — ABNORMAL LOW (ref 13.0–17.1)
LYMPH#: 1.7 10*3/uL (ref 0.9–3.3)
LYMPH%: 25.6 % (ref 14.0–49.0)
MCH: 28.7 pg (ref 27.2–33.4)
MCHC: 32.5 g/dL (ref 32.0–36.0)
MCV: 88.3 fL (ref 79.3–98.0)
MONO#: 0.7 10*3/uL (ref 0.1–0.9)
MONO%: 10.8 % (ref 0.0–14.0)
NEUT#: 3.8 10*3/uL (ref 1.5–6.5)
NEUT%: 57.8 % (ref 39.0–75.0)
PLATELETS: 215 10*3/uL (ref 140–400)
RBC: 3.83 10*6/uL — AB (ref 4.20–5.82)
RDW: 14.8 % — ABNORMAL HIGH (ref 11.0–14.6)
WBC: 6.5 10*3/uL (ref 4.0–10.3)

## 2014-04-14 LAB — COMPREHENSIVE METABOLIC PANEL (CC13)
ALT: 21 U/L (ref 0–55)
ANION GAP: 12 meq/L — AB (ref 3–11)
AST: 17 U/L (ref 5–34)
Albumin: 3.7 g/dL (ref 3.5–5.0)
Alkaline Phosphatase: 72 U/L (ref 40–150)
BILIRUBIN TOTAL: 0.46 mg/dL (ref 0.20–1.20)
BUN: 39.9 mg/dL — ABNORMAL HIGH (ref 7.0–26.0)
CO2: 24 meq/L (ref 22–29)
Calcium: 9.2 mg/dL (ref 8.4–10.4)
Chloride: 104 mEq/L (ref 98–109)
Creatinine: 1.9 mg/dL — ABNORMAL HIGH (ref 0.7–1.3)
Glucose: 177 mg/dl — ABNORMAL HIGH (ref 70–140)
Potassium: 4 mEq/L (ref 3.5–5.1)
SODIUM: 140 meq/L (ref 136–145)
Total Protein: 7.3 g/dL (ref 6.4–8.3)

## 2014-04-17 ENCOUNTER — Inpatient Hospital Stay: Payer: Commercial Managed Care - HMO | Admitting: Internal Medicine

## 2014-04-17 DIAGNOSIS — Z0289 Encounter for other administrative examinations: Secondary | ICD-10-CM

## 2014-04-21 ENCOUNTER — Ambulatory Visit (HOSPITAL_BASED_OUTPATIENT_CLINIC_OR_DEPARTMENT_OTHER): Payer: Commercial Managed Care - HMO

## 2014-04-21 ENCOUNTER — Ambulatory Visit (INDEPENDENT_AMBULATORY_CARE_PROVIDER_SITE_OTHER): Payer: Commercial Managed Care - HMO | Admitting: Cardiovascular Disease

## 2014-04-21 ENCOUNTER — Encounter: Payer: Self-pay | Admitting: Cardiovascular Disease

## 2014-04-21 VITALS — BP 118/72 | HR 90 | Ht 75.0 in | Wt 303.8 lb

## 2014-04-21 VITALS — BP 140/66 | HR 81 | Temp 97.4°F

## 2014-04-21 DIAGNOSIS — I5042 Chronic combined systolic (congestive) and diastolic (congestive) heart failure: Secondary | ICD-10-CM

## 2014-04-21 DIAGNOSIS — Z452 Encounter for adjustment and management of vascular access device: Secondary | ICD-10-CM

## 2014-04-21 DIAGNOSIS — Z95828 Presence of other vascular implants and grafts: Secondary | ICD-10-CM

## 2014-04-21 DIAGNOSIS — R0602 Shortness of breath: Secondary | ICD-10-CM

## 2014-04-21 DIAGNOSIS — C9110 Chronic lymphocytic leukemia of B-cell type not having achieved remission: Secondary | ICD-10-CM

## 2014-04-21 DIAGNOSIS — E785 Hyperlipidemia, unspecified: Secondary | ICD-10-CM

## 2014-04-21 DIAGNOSIS — I5043 Acute on chronic combined systolic (congestive) and diastolic (congestive) heart failure: Secondary | ICD-10-CM

## 2014-04-21 MED ORDER — ATORVASTATIN CALCIUM 20 MG PO TABS: 20.0000 mg | ORAL_TABLET | Freq: Every day | ORAL | Status: AC

## 2014-04-21 MED ORDER — CARVEDILOL 12.5 MG PO TABS: 12.5000 mg | ORAL_TABLET | Freq: Two times a day (BID) | ORAL | Status: AC

## 2014-04-21 MED ORDER — LISINOPRIL 10 MG PO TABS: 10.0000 mg | ORAL_TABLET | Freq: Every day | ORAL | Status: DC

## 2014-04-21 MED ORDER — SODIUM CHLORIDE 0.9 % IJ SOLN
10.0000 mL | INTRAMUSCULAR | Status: DC | PRN
  Administered 2014-04-21: 10 mL via INTRAVENOUS
  Filled 2014-04-21: qty 10

## 2014-04-21 MED ORDER — HEPARIN SOD (PORK) LOCK FLUSH 100 UNIT/ML IV SOLN
500.0000 [IU] | Freq: Once | INTRAVENOUS | Status: AC
  Administered 2014-04-21: 500 [IU] via INTRAVENOUS
  Filled 2014-04-21: qty 5

## 2014-04-21 NOTE — Patient Instructions (Addendum)
Your physician recommends that you continue on your current medications as directed. Please refer to the Current Medication list given to you today.  Your physician recommends that you have lab work:  TODAY - basic metabolic panel  Your physician recommends that you schedule a follow-up appointment in: 4-6 weeks with Dr. Acie Fredrickson or NP/PA Your physician recommends that you return for lab work in: on the same day as your next office visit - lipid and liver profile  You will need to FAST for this appointment - nothing to eat or drink after midnight the night before except water.

## 2014-04-21 NOTE — Assessment & Plan Note (Signed)
[name] [name] in followup after being hospitalized for acute on chronic congestive heart failure. He still has some excess fluid. His creatinine is slightly high at 1.9.  We'll continue the same medications for now. I did check a basic metabolic profile. I instructed him to take an extra dose of torsemide for 2-3 days if he should become volume overloaded. I once again reminded him to be very careful with his salt.

## 2014-04-21 NOTE — Progress Notes (Signed)
[name] [name] Date of Birth  1946/01/03       [facility] Office [address], [place], [place]  [address]   [place], [place]  [address] [phone]     [phone]   Fax  [phone]    Fax [phone]  Problem List: 1. Coronary artery disease 2. Ischemic cardiopathy with an ejection fraction of around 35% 3. Aortic stenosis - moderate 4.  Non hodgekins lymphoma 5. Type 2 diabetes mellitus 6.  Sleep apnea  History of Present Illness:  [name] [name] is a 68 yo with hx of CAD, CHF.  He has been seen at [facility] for the past year.  He has had progressive dyspnea for the past 4 months.   He has had increased swelling in his legs and ankles.  He has not been able to get back in with [facility] and wanted to come here for further evaluation.    He had 2 stents placed in Oct. 2013.  He did well for 6 months.  Then his condition started to decline - porgressive , severe DOE with any exertion.  Echo recently showed. Left ventricle: The cavity size was mildly dilated. Wall thickness was normal. Systolic function was mildly to  moderately reduced. The estimated ejection fraction was in the range of 40% to 45%. There is akinesis of the posterior and distal lateral myocardium; inferior hypokinesis. Doppler parameters are consistent with abnormal left ventricular relaxation (grade 1 diastolic dysfunction). - Mitral valve: Mild regurgitation. - Left atrium: The atrium was mildly dilated. PA pressures of 62.  January 23, 2013: [name] [name] is doing much better.  We started him on torsemide and he has diuresed quite nicely.  He has lost 17 pounds in the past 2 weeks.  He is still dyspneic with exertion.  He is trying to avoid salt but is having some challenges. No CP.   No PND.  No significant edema.  February 25, 2013: [name] [name] is dong well.  Breathing is better.  He is having some foot / toe pain.  Needs to keep his toenails  trimmed better.   Oct. 22, 2014: [name] [name] is doing well.  Recently diagnosed with diverticulosis.    Jan. 8, 2015: [name] [name] has gained weight since I saw him. His weight is up 14 pounds since his last office visit in October.  He has has been eating lots and lots of food since Christmas.  He has not bee eating much salt.   He is feeling disgusted with himself.  He knows that he had been eating too much and has not been exercising.  He want to [facility] for Christmas vacation.  He was not able to climb the 3 flights of stairs at his sister's house.  He started smoking cigarettes again for a month or so. He now has stopped again.    October 11, 2013: He has stopped smoking ( again)  Slowly improving with hi breathing.  Oct. 5, 2015: This raises him back today after a recent hospitalization for CHF. He has improved his diet. He had been drinking a lot of water and may have over hydrated himself. He is feeling better     Current Outpatient Prescriptions on File Prior to Visit  Medication Sig Dispense Refill  . albuterol (PROVENTIL HFA;VENTOLIN HFA) 108 (90 BASE) MCG/ACT inhaler Inhale 2 puffs into the lungs every 6 (six) hours as needed for wheezing or shortness  of breath.  1 Inhaler  11  . AMBULATORY NON FORMULARY MEDICATION Home O2 @@ 2 LMP for 8 hrs at night      . AMBULATORY NON FORMULARY MEDICATION CPAP      . aspirin EC 81 MG tablet Take 81 mg by mouth daily.      Marland Kitchen atorvastatin (LIPITOR) 20 MG tablet Take 1 tablet (20 mg total) by mouth daily at 6 PM.  90 tablet  1  . Blood Glucose Monitoring Suppl (ACCU-CHEK AVIVA PLUS) W/DEVICE KIT Use to check blood sugars twice a day Dx 250.00  1 kit  0  . carvedilol (COREG) 12.5 MG tablet Take 1 tablet (12.5 mg total) by mouth 2 (two) times daily.  180 tablet  3  . glipiZIDE (GLUCOTROL) 10 MG tablet Take 1 tablet (10 mg total) by mouth 2 (two) times daily before a meal.  180 tablet  3  . glucose blood (ACCU-CHEK AVIVA PLUS) test strip Use to  check blood sugars twice a day Dx 250.00  60 each  11  . insulin glargine (LANTUS) 100 UNIT/ML injection Inject 20 Units into the skin at bedtime.      . Insulin Pen Needle (BD PEN NEEDLE NANO U/F) 32G X 4 MM MISC Use as directed 1 per day   250.02  100 each  11  . lidocaine-prilocaine (EMLA) cream Apply 1 application topically as needed.  30 g  3  . lisinopril (PRINIVIL,ZESTRIL) 10 MG tablet Take 1 tablet (10 mg total) by mouth daily.  30 tablet  11  . potassium chloride (K-DUR) 10 MEQ tablet Take 2 tablets (20 mEq total) by mouth daily.  180 tablet  1  . sitaGLIPtin (JANUVIA) 100 MG tablet Take 1 tablet (100 mg total) by mouth daily.  30 tablet  11  . tiotropium (SPIRIVA) 18 MCG inhalation capsule Place 18 mcg into inhaler and inhale daily as needed. Uses for shortness of breath.      . torsemide (DEMADEX) 20 MG tablet Take 40 mg by mouth 2 (two) times daily.       Current Facility-Administered Medications on File Prior to Visit  Medication Dose Route Frequency Provider Last Rate Last Dose  . sodium chloride 0.9 % injection 10 mL  10 mL Intravenous PRN [name], MD   10 mL at 04/25/13 1100    Allergies  Allergen Reactions  . Other Hives    Poison ivy.     Past Medical History  Diagnosis Date  . Ischemic cardiomyopathy   . Type II diabetes mellitus   . Hypertension   . Hypercholesterolemia   . Lower GI bleeding 1980's    "when I was drinking alot" (06/06/2012)  . Arthritis     "both shoulders" (06/06/2012)  . Chronic combined systolic and diastolic CHF (congestive heart failure) 07/10/2012    a. EF 24-26%, + diastolic dysfunction by echo 12/2012.  Marland Kitchen GERD (gastroesophageal reflux disease) 07/12/2012  . OSA on CPAP     uses a cpap  . Myocardial infarction 11/13  . Coronary artery disease     a. Cath 05/2012 with CTO of RCA; s/p BMS to severe OM-1 stenosis.  Marland Kitchen History of alcoholism   . Sleep apnea   . Moderate aortic stenosis     a. Mod by echo 12/2012.  Marland Kitchen Pancreatitis    . Diverticulosis   . Personal history of rectal adenoma 05/28/2013  . Dietary noncompliance   . Morbid obesity   . Lymphoma  a. 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  . Shortness of breath     Past Surgical History  Procedure Laterality Date  . Coronary angioplasty with stent placement  06/06/2012    "1"  . Lymph gland excision  07/13/2012    Procedure: CERVICAL LYMPH GLAND EXCISION;  Surgeon: Haywood Lasso, MD;  Location: [facility];  Service: General;  Laterality: Right;  . Portacath placement  07/30/2012    Procedure: INSERTION PORT-A-CATH;  Surgeon: Haywood Lasso, MD;  Location: [facility];  Service: General;  Laterality: Right;  . Colonoscopy N/A 05/28/2013    Procedure: COLONOSCOPY;  Surgeon: Gatha Mayer, MD;  Location: [facility];  Service: Endoscopy;  Laterality: N/A;    History  Smoking status  . Former Smoker -- 1.00 packs/day for 53 years  . Types: Cigarettes  . Quit date: 07/18/2013  Smokeless tobacco  . Never Used    History  Alcohol Use No    Comment: Hasn't drank in over 20 yrs (former alcoholic)    Family History  Problem Relation Age of Onset  . Osteoarthritis Mother   . Osteoarthritis Father   . Colon cancer Daughter   . Diabetes Father     entire family  . Heart disease Father     Reviw of Systems:  Reviewed in the HPI.  All other systems are negative. Wt Readings from Last 3 Encounters:  04/21/14 303 lb 12.8 oz (137.803 kg)  04/10/14 295 lb 10.2 oz (134.1 kg)  03/27/14 306 lb 6.4 oz (138.982 kg)    Physical Exam: Blood pressure 118/72, pulse 90, height $RemoveBe'6\' 3"'[id number]$  (1.905 m), weight 303 lb 12.8 oz (137.803 kg). General: Well developed, well nourished, in no acute distress.   Head: Normocephalic, atraumatic, sclera non-icteric, mucus membranes are moist,  Neck: Supple. Carotids are 2 + without bruits. JVD 10  cm Lungs: Clear  Heart: RR,  8-9/3 systolic murmur  Abdomen: Soft, non-tender, slightly edematous Msk:  Strength and tone are normal  Extremitis: No clubbing or cyanosis.1-2+ pitting edema in lower legs. ,   Neuro: CN II - XII intact.  Alert and oriented X 3.  Psych:  Normal  ECG:  Jan. 8, 2015:  NSR at 84. TWI in the lateral leads.   Assessment / Plan:

## 2014-04-21 NOTE — Patient Instructions (Signed)

## 2014-04-22 LAB — BASIC METABOLIC PANEL
BUN: 44 mg/dL — AB (ref 6–23)
CALCIUM: 9.5 mg/dL (ref 8.4–10.5)
CO2: 26 meq/L (ref 19–32)
CREATININE: 1.7 mg/dL — AB (ref 0.4–1.5)
Chloride: 101 mEq/L (ref 96–112)
GFR: 51.18 mL/min — ABNORMAL LOW (ref >60.00)
Glucose, Bld: 230 mg/dL — ABNORMAL HIGH (ref 70–99)
Potassium: 4.7 mEq/L (ref 3.5–5.1)
Sodium: 137 mEq/L (ref 135–145)

## 2014-05-19 ENCOUNTER — Ambulatory Visit (INDEPENDENT_AMBULATORY_CARE_PROVIDER_SITE_OTHER): Payer: Commercial Managed Care - HMO | Admitting: Nurse Practitioner

## 2014-05-19 ENCOUNTER — Encounter: Payer: Self-pay | Admitting: Nurse Practitioner

## 2014-05-19 ENCOUNTER — Other Ambulatory Visit (INDEPENDENT_AMBULATORY_CARE_PROVIDER_SITE_OTHER): Payer: Commercial Managed Care - HMO | Admitting: *Deleted

## 2014-05-19 VITALS — BP 138/64 | HR 81 | Ht 75.0 in | Wt 301.0 lb

## 2014-05-19 DIAGNOSIS — I350 Nonrheumatic aortic (valve) stenosis: Secondary | ICD-10-CM

## 2014-05-19 DIAGNOSIS — I5042 Chronic combined systolic (congestive) and diastolic (congestive) heart failure: Secondary | ICD-10-CM

## 2014-05-19 DIAGNOSIS — I10 Essential (primary) hypertension: Secondary | ICD-10-CM

## 2014-05-19 DIAGNOSIS — R0600 Dyspnea, unspecified: Secondary | ICD-10-CM

## 2014-05-19 LAB — CBC
HCT: 35.9 % — ABNORMAL LOW (ref 39.0–52.0)
Hemoglobin: 11.6 g/dL — ABNORMAL LOW (ref 13.0–17.0)
MCHC: 32.3 g/dL (ref 30.0–36.0)
MCV: 87.5 fl (ref 78.0–100.0)
Platelets: 185 10*3/uL (ref 150.0–400.0)
RBC: 4.1 Mil/uL — ABNORMAL LOW (ref 4.22–5.81)
RDW: 13.5 % (ref 11.5–15.5)
WBC: 7.4 10*3/uL (ref 4.0–10.5)

## 2014-05-19 LAB — BASIC METABOLIC PANEL
BUN: 58 mg/dL — ABNORMAL HIGH (ref 6–23)
CO2: 25 mEq/L (ref 19–32)
Calcium: 9.3 mg/dL (ref 8.4–10.5)
Chloride: 96 mEq/L (ref 96–112)
Creatinine, Ser: 2.3 mg/dL — ABNORMAL HIGH (ref 0.4–1.5)
GFR: 36.41 mL/min — ABNORMAL LOW (ref >60.00)
Glucose, Bld: 271 mg/dL — ABNORMAL HIGH (ref 70–99)
Potassium: 4.5 mEq/L (ref 3.5–5.1)
Sodium: 131 mEq/L — ABNORMAL LOW (ref 135–145)

## 2014-05-19 LAB — LIPID PANEL
CHOL/HDL RATIO: 4
Cholesterol: 141 mg/dL (ref 0–200)
HDL: 31.5 mg/dL — ABNORMAL LOW (ref >39.00)
LDL Cholesterol: 80 mg/dL (ref 0–99)
NONHDL: 109.5
Triglycerides: 149 mg/dL (ref 0.0–149.0)
VLDL: 29.8 mg/dL (ref 0.0–40.0)

## 2014-05-19 LAB — HEPATIC FUNCTION PANEL
ALT: 19 U/L (ref 0–53)
AST: 17 U/L (ref 0–37)
Albumin: 3.7 g/dL (ref 3.5–5.2)
Alkaline Phosphatase: 66 U/L (ref 39–117)
BILIRUBIN DIRECT: 0 mg/dL (ref 0.0–0.3)
Total Bilirubin: 0.6 mg/dL (ref 0.2–1.2)
Total Protein: 7.6 g/dL (ref 6.0–8.3)

## 2014-05-19 LAB — BRAIN NATRIURETIC PEPTIDE: Pro B Natriuretic peptide (BNP): 74 pg/mL (ref 0.0–100.0)

## 2014-05-19 NOTE — Progress Notes (Signed)
[name] [name] Date of Birth: [birth year] Medical Record #[id number]  History of Present Illness: [name] [name] is seen back today for a post hospital visit - seen for Dr. Acie Fredrickson. 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 He has CTO of the RCA, prior BMS to OM1 . EF has been down to 35% back by echo in June of 2014. He has been noncompliant.   Most recently was in the hospital with acute on chronic combined systolic and diastolic HF. Failed on outpatient therapy. He has seen Dr. Acie Fredrickson in follow up a month ago. Seemed to be doing ok - was advised to increase his diuretics for a couple of days.   Comes in today. Here alone. He does not know why he is here. Upset about [employer] not paying for his oxygen. Upset about the cost of his medicines. No chest pain. Says his breathing is ok. Tries to limit his salt. Some swelling but improved. Hard to say if he is actually taking his medicines. Says his weight is down to 295 by his scales.   Current Outpatient Prescriptions  Medication Sig Dispense Refill  . albuterol (PROVENTIL HFA;VENTOLIN HFA) 108 (90 BASE) MCG/ACT inhaler Inhale 2 puffs into the lungs every 6 (six) hours as needed for wheezing or shortness of breath. 1 Inhaler 11  . AMBULATORY NON FORMULARY MEDICATION Home O2 @@ 2 LMP for 8 hrs at night    . AMBULATORY NON FORMULARY MEDICATION CPAP    . aspirin EC 81 MG tablet Take 81 mg by mouth daily.    Marland Kitchen atorvastatin (LIPITOR) 20 MG tablet Take 1 tablet (20 mg total) by mouth daily at 6 PM. 90 tablet 3  . Blood Glucose Monitoring Suppl (ACCU-CHEK AVIVA PLUS) W/DEVICE KIT Use to check blood sugars twice a day Dx 250.00 1 kit 0  . carvedilol (COREG) 12.5 MG tablet Take 1 tablet (12.5 mg total) by mouth 2 (two) times daily. 180 tablet 3   . glipiZIDE (GLUCOTROL) 10 MG tablet Take 1 tablet (10 mg total) by mouth 2 (two) times daily before a meal. 180 tablet 3  . glucose blood (ACCU-CHEK AVIVA PLUS) test strip Use to check blood sugars twice a day Dx 250.00 60 each 11  . insulin glargine (LANTUS) 100 UNIT/ML injection Inject 20 Units into the skin at bedtime.    . Insulin Pen Needle (BD PEN NEEDLE NANO U/F) 32G X 4 MM MISC Use as directed 1 per day   250.02 100 each 11  . lidocaine-prilocaine (EMLA) cream Apply 1 application topically as needed. 30 g 3  . lisinopril (PRINIVIL,ZESTRIL) 10 MG tablet Take 1 tablet (10 mg total) by mouth daily. 30 tablet 11  . potassium chloride (K-DUR) 10 MEQ tablet Take 2 tablets (20 mEq total) by mouth daily. 180 tablet 1  . sitaGLIPtin (JANUVIA) 100 MG tablet Take 1 tablet (100 mg total) by mouth daily. 30 tablet 11  . tiotropium (SPIRIVA) 18 MCG inhalation capsule Place 18 mcg into inhaler and inhale daily as needed. Uses for shortness of breath.    . torsemide (DEMADEX) 20 MG tablet Take 40 mg by mouth 2 (two) times daily.     No current facility-administered medications for this visit.   Facility-Administered Medications Ordered in Other Visits  Medication Dose Route Frequency Provider Last  Rate Last Dose  . sodium chloride 0.9 % injection 10 mL  10 mL Intravenous PRN [name], MD   10 mL at 04/25/13 1100    Allergies  Allergen Reactions  . Other Hives    Poison ivy.     Past Medical History  Diagnosis Date  . Ischemic cardiomyopathy   . Type II diabetes mellitus   . Hypertension   . Hypercholesterolemia   . Lower GI bleeding 1980's    "when I was drinking alot" (06/06/2012)  . Arthritis     "both shoulders" (06/06/2012)  . Chronic combined systolic and diastolic CHF (congestive heart failure) 07/10/2012    a. EF 83-38%, + diastolic dysfunction by echo 12/2012.  Marland Kitchen GERD (gastroesophageal reflux disease) 07/12/2012  . OSA on CPAP     uses a cpap  . Myocardial infarction  11/13  . Coronary artery disease     a. Cath 05/2012 with CTO of RCA; s/p BMS to severe OM-1 stenosis.  Marland Kitchen History of alcoholism   . Sleep apnea   . Moderate aortic stenosis     a. Mod by echo 12/2012.  Marland Kitchen Pancreatitis   . Diverticulosis   . Personal history of rectal adenoma 05/28/2013  . Dietary noncompliance   . Morbid obesity   . Lymphoma     a. 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  . Shortness of breath     Past Surgical History  Procedure Laterality Date  . Coronary angioplasty with stent placement  06/06/2012    "1"  . Lymph gland excision  07/13/2012    Procedure: CERVICAL LYMPH GLAND EXCISION;  Surgeon: Haywood Lasso, MD;  Location: [place];  Service: General;  Laterality: Right;  . Portacath placement  07/30/2012    Procedure: INSERTION PORT-A-CATH;  Surgeon: Haywood Lasso, MD;  Location: [place];  Service: General;  Laterality: Right;  . Colonoscopy N/A 05/28/2013    Procedure: COLONOSCOPY;  Surgeon: Gatha Mayer, MD;  Location: [facility];  Service: Endoscopy;  Laterality: N/A;    History  Smoking status  . Former Smoker -- 1.00 packs/day for 53 years  . Types: Cigarettes  . Quit date: 07/18/2013  Smokeless tobacco  . Never Used    History  Alcohol Use No    Comment: Hasn't drank in over 20 yrs (former alcoholic)    Family History  Problem Relation Age of Onset  . Osteoarthritis Mother   . Osteoarthritis Father   . Colon cancer Daughter   . Diabetes Father     entire family  . Heart disease Father     Review of Systems: The review of systems is per the HPI.  All other systems were reviewed and are negative.  Physical Exam: BP 138/64 mmHg  Pulse 81  Ht $R'6\' 3"'rc$  (1.905 m)  Wt 301 lb (136.533 kg)  BMI 37.62 kg/m2 Patient is alert and in no acute distress. He is obese. Skin is warm and dry. Color is normal.  HEENT is unremarkable.  Normocephalic/atraumatic. PERRL. Sclera are nonicteric. Neck is supple. No masses. No JVD. Lungs are clear. Cardiac exam shows a regular rate and rhythm. Abdomen is soft. Extremities are without edema. Gait and ROM are intact. No gross neurologic deficits noted.  Wt Readings from Last 3 Encounters:  05/19/14 301 lb (136.533 kg)  04/21/14 303 lb 12.8 oz (137.803 kg)  04/10/14 295 lb 10.2 oz (134.1 kg)  LABORATORY DATA/PROCEDURES: PENDING  Lab Results  Component Value Date   WBC 6.5 04/14/2014   HGB 11.0* 04/14/2014   HCT 33.8* 04/14/2014   PLT 215 04/14/2014   GLUCOSE 230* 04/21/2014   CHOL 154 02/04/2014   TRIG 340.0* 02/04/2014   HDL 35.20* 02/04/2014   LDLCALC 51 02/04/2014   ALT 21 04/14/2014   AST 17 04/14/2014   NA 137 04/21/2014   K 4.7 04/21/2014   CL 101 04/21/2014   CREATININE 1.7* 04/21/2014   BUN 44* 04/21/2014   CO2 26 04/21/2014   TSH 0.76 02/04/2014   PSA 0.58 02/04/2014   INR 1.15 07/09/2012   HGBA1C 18.5* 02/04/2014   MICROALBUR 0.3 02/04/2014    BNP (last 3 results)  Recent Labs  04/07/14 1125  PROBNP 1016.0*   Echo Study Conclusions from 03/2014  - Left ventricle: The cavity size was normal. There was mild focal basal hypertrophy of the septum. Systolic function was normal. The estimated ejection fraction was in the range of 55% to 60%. Wall motion was normal; there were no regional wall motion abnormalities. Doppler parameters are consistent with abnormal left ventricular relaxation (grade 1 diastolic dysfunction). Doppler parameters are consistent with high ventricular filling pressure. - Aortic valve: Cusp separation was reduced. There was at least moderate stenosis morphologically. Prior echo with peak velocity 3.50m/s. - Mitral valve: There was mild regurgitation. - Left atrium: The atrium was mildly dilated.  Impressions:  - When compared to prior, EF is improved.   Assessment / Plan: 1. Combined systolic and  diastolic HF - EF has improved - would keep on his current regimen. Hopefully he can be compliant with his medicines. Advised to watch his salt and fluid intake.  2. HTN -  BP ok  3. HLD - on statin  4. CAD- no active symptoms  5. Moderate AS - will need follow up echo  6. CKD  See Dr. Acie Fredrickson in 3 months. No change in current regimen. Recheck labs today.   Patient is agreeable to this plan and will call if any problems develop in the interim.   Burtis Junes, RN, [facility] [address] [place],   [address] [phone]

## 2014-05-19 NOTE — Patient Instructions (Addendum)
We will be checking the following labs today BMET, CBC and BNP  Stay on your current medicines  See Dr. Acie Fredrickson in 3 months  Keep restricting your salt and weighing daily  Call the [place] office at [phone] if you have any questions, problems or concerns.

## 2014-05-23 ENCOUNTER — Other Ambulatory Visit: Payer: Self-pay | Admitting: *Deleted

## 2014-05-23 DIAGNOSIS — N189 Chronic kidney disease, unspecified: Secondary | ICD-10-CM

## 2014-05-30 ENCOUNTER — Telehealth: Payer: Self-pay | Admitting: Internal Medicine

## 2014-05-30 ENCOUNTER — Other Ambulatory Visit: Payer: Self-pay | Admitting: *Deleted

## 2014-05-30 ENCOUNTER — Other Ambulatory Visit (INDEPENDENT_AMBULATORY_CARE_PROVIDER_SITE_OTHER): Payer: Commercial Managed Care - HMO | Admitting: *Deleted

## 2014-05-30 DIAGNOSIS — N189 Chronic kidney disease, unspecified: Secondary | ICD-10-CM

## 2014-05-30 DIAGNOSIS — E119 Type 2 diabetes mellitus without complications: Secondary | ICD-10-CM

## 2014-05-30 DIAGNOSIS — R799 Abnormal finding of blood chemistry, unspecified: Secondary | ICD-10-CM

## 2014-05-30 LAB — BASIC METABOLIC PANEL
BUN: 72 mg/dL — ABNORMAL HIGH (ref 6–23)
CO2: 24 mEq/L (ref 19–32)
Calcium: 9.6 mg/dL (ref 8.4–10.5)
Chloride: 100 mEq/L (ref 96–112)
Creatinine, Ser: 2.5 mg/dL — ABNORMAL HIGH (ref 0.4–1.5)
GFR: 33.85 mL/min — ABNORMAL LOW (ref >60.00)
Glucose, Bld: 288 mg/dL — ABNORMAL HIGH (ref 70–99)
Potassium: 4.8 mEq/L (ref 3.5–5.1)
Sodium: 133 mEq/L — ABNORMAL LOW (ref 135–145)

## 2014-05-30 MED ORDER — INSULIN GLARGINE 100 UNIT/ML ~~LOC~~ SOLN: 25.0000 [IU] | Freq: Every day | SUBCUTANEOUS | Status: DC

## 2014-05-30 MED ORDER — INSULIN GLARGINE 100 UNIT/ML ~~LOC~~ SOLN: 20.0000 [IU] | Freq: Every day | SUBCUTANEOUS | Status: DC

## 2014-05-30 NOTE — Telephone Encounter (Signed)
Pt came by office stating he needs refill on Lantus.  Pt's pharmacy is [facility] on [address] off [address].

## 2014-05-30 NOTE — Telephone Encounter (Signed)
Refilled Lantus today.  Patients blood sugar yesterday was 450 and today was 385, he apparently has been out and states has been calling and leaving messages, but see no documentation.  Advise on high sugars?

## 2014-05-30 NOTE — Telephone Encounter (Signed)
Called the patient did inform of all information.  The patient did verbalize understanding and was agreeable to all instructions.

## 2014-05-30 NOTE — Telephone Encounter (Signed)
Not sure why difficulty in getting information regarding need for refill,   Ok to increase the lantus to 25 units per day, cont all other meds  Continue to check sugars and call Mon or Tues next with levels  I will need to refer him to endocrinology for better control as he has had severe uncontrolled recently

## 2014-06-06 ENCOUNTER — Encounter: Payer: Self-pay | Admitting: Endocrinology

## 2014-06-06 ENCOUNTER — Ambulatory Visit (INDEPENDENT_AMBULATORY_CARE_PROVIDER_SITE_OTHER): Payer: Commercial Managed Care - HMO | Admitting: Endocrinology

## 2014-06-06 ENCOUNTER — Other Ambulatory Visit (INDEPENDENT_AMBULATORY_CARE_PROVIDER_SITE_OTHER): Payer: Commercial Managed Care - HMO | Admitting: *Deleted

## 2014-06-06 ENCOUNTER — Other Ambulatory Visit: Payer: Self-pay | Admitting: *Deleted

## 2014-06-06 VITALS — BP 108/60 | HR 75 | Temp 98.2°F | Resp 16 | Ht 75.0 in | Wt 301.8 lb

## 2014-06-06 DIAGNOSIS — N184 Chronic kidney disease, stage 4 (severe): Secondary | ICD-10-CM

## 2014-06-06 DIAGNOSIS — I10 Essential (primary) hypertension: Secondary | ICD-10-CM

## 2014-06-06 DIAGNOSIS — E1165 Type 2 diabetes mellitus with hyperglycemia: Secondary | ICD-10-CM

## 2014-06-06 DIAGNOSIS — IMO0002 Reserved for concepts with insufficient information to code with codable children: Secondary | ICD-10-CM

## 2014-06-06 DIAGNOSIS — R799 Abnormal finding of blood chemistry, unspecified: Secondary | ICD-10-CM

## 2014-06-06 LAB — BASIC METABOLIC PANEL
BUN: 71 mg/dL — ABNORMAL HIGH (ref 6–23)
CO2: 21 mEq/L (ref 19–32)
Calcium: 9 mg/dL (ref 8.4–10.5)
Chloride: 102 mEq/L (ref 96–112)
Creatinine, Ser: 2.6 mg/dL — ABNORMAL HIGH (ref 0.4–1.5)
GFR: 32.48 mL/min — ABNORMAL LOW (ref >60.00)
Glucose, Bld: 181 mg/dL — ABNORMAL HIGH (ref 70–99)
Potassium: 4.9 mEq/L (ref 3.5–5.1)
Sodium: 133 mEq/L — ABNORMAL LOW (ref 135–145)

## 2014-06-06 LAB — HEMOGLOBIN A1C: Hgb A1c MFr Bld: 12.6 % — ABNORMAL HIGH (ref 4.6–6.5)

## 2014-06-06 MED ORDER — INSULIN LISPRO 100 UNIT/ML (KWIKPEN): PEN_INJECTOR | SUBCUTANEOUS | Status: DC

## 2014-06-06 MED ORDER — GLUCOSE BLOOD VI STRP: ORAL_STRIP | Status: DC

## 2014-06-06 NOTE — Patient Instructions (Addendum)
Please check blood sugars at least half the time about 2 hours after any meal and 3-4 times per week on waking up.  Please bring blood sugar monitor to each visit  Humalog 10 units with main meal or if eating sandwich/starchy food and see if sugar after meal is still in 100-180 range Lantus 30 units daily  Januvia 1/2 daily, stop Glipizide  Exercise as tolerated

## 2014-06-06 NOTE — Progress Notes (Signed)
Patient ID: [name] [name], male   DOB: Oct 10, 1945, 68 y.o.   MRN: [id number]           Reason for Appointment: Consultation for Type 2 Diabetes  Referring physician:  Cathlean Cower  History of Present Illness:          Diagnosis: Type 2 diabetes mellitus, date of diagnosis: 2005       Past history:    In the past  He has had treatment with metformin, glipizide and Januvia   Metformin was stopped when he had worsening renal function  Overall he has had been consistently controlled diabetes.  Lowest A1c has been 7.8,  This was in 2013 In 7/15 his glucose was 538 with his regimen of oral agents and he was started on Lantus insulin  Recent history:  Despite some increase in his Lantus insulin his blood sugars continue to be poorly controlled. He is still taking Januvia and glipizide. He is checking his blood sugar only in the morning and did not bring any record. He does not complain of excessive thirst, urination or unusual fatigue  His Lantus insulin was increased by 5 units to 25 units by does not think his readings are better   His recent lab glucose readings are also significantly high per He has difficulty losing weight       Oral hypoglycemic drugs the patient is taking are:  Januvia, glipizide     Side effects from medications have been: none INSULIN regimen is described as: 25 Lantus at bedtime  Compliance with the medical regimen: fair  Hypoglycemia:  none   Glucose monitoring:  done one time a day         Glucometer: Accucheck     Blood Glucose readings by recall in am: 180-280  Self-care: The diet that the patient has been following is: tries to limit .     Meals: 3 meals per day. Breakfast is usually bacon and eggs and grits.  He is eating more salads and occasionally will make a meal of this.  Otherwise will have fresh or fruit as well as cashew nuts at meals or snacks           Exercise: unable to do much lately        Dietician visit, most recent: never.                 Weight history:  Previous range:250-325  Wt Readings from Last 3 Encounters:  06/06/14 301 lb 12.8 oz (136.896 kg)  05/19/14 301 lb (136.533 kg)  04/21/14 303 lb 12.8 oz (137.803 kg)    Glycemic control:   Lab Results  Component Value Date   HGBA1C 18.5* 02/04/2014   HGBA1C 8.7* 06/26/2013   HGBA1C 11.9* 11/30/2012   Lab Results  Component Value Date   MICROALBUR 0.3 02/04/2014   LDLCALC 80 05/19/2014   CREATININE 2.5* 05/30/2014         Medication List       This list is accurate as of: 06/06/14  2:18 PM.  Always use your most recent med list.               ACCU-CHEK AVIVA PLUS W/DEVICE Kit  - Use to check blood sugars twice a day  - Dx 250.00     albuterol 108 (90 BASE) MCG/ACT inhaler  Commonly known as:  PROVENTIL HFA;VENTOLIN HFA  Inhale 2 puffs into the lungs every 6 (six) hours as needed for wheezing or shortness of  breath.     AMBULATORY NON FORMULARY MEDICATION  Home O2 @@ 2 LMP for 8 hrs at night     AMBULATORY NON FORMULARY MEDICATION  CPAP     aspirin EC 81 MG tablet  Take 81 mg by mouth daily.     atorvastatin 20 MG tablet  Commonly known as:  LIPITOR  Take 1 tablet (20 mg total) by mouth daily at 6 PM.     carvedilol 12.5 MG tablet  Commonly known as:  COREG  Take 1 tablet (12.5 mg total) by mouth 2 (two) times daily.     glipiZIDE 10 MG tablet  Commonly known as:  GLUCOTROL  Take 1 tablet (10 mg total) by mouth 2 (two) times daily before a meal.     glucose blood test strip  Commonly known as:  ACCU-CHEK AVIVA PLUS  - Use to check blood sugars twice a day  - Dx 250.00     insulin glargine 100 UNIT/ML injection  Commonly known as:  LANTUS  Inject 0.25 mLs (25 Units total) into the skin at bedtime.     Insulin Pen Needle 32G X 4 MM Misc  Commonly known as:  BD PEN NEEDLE NANO U/F  Use as directed 1 per day   250.02     lidocaine-prilocaine cream  Commonly known as:  EMLA  Apply 1 application topically as needed.      lisinopril 10 MG tablet  Commonly known as:  PRINIVIL,ZESTRIL  Take 1 tablet (10 mg total) by mouth daily.     potassium chloride 10 MEQ tablet  Commonly known as:  K-DUR  Take 2 tablets (20 mEq total) by mouth daily.     sitaGLIPtin 100 MG tablet  Commonly known as:  JANUVIA  Take 1 tablet (100 mg total) by mouth daily.     tiotropium 18 MCG inhalation capsule  Commonly known as:  SPIRIVA  Place 18 mcg into inhaler and inhale daily as needed. Uses for shortness of breath.     torsemide 20 MG tablet  Commonly known as:  DEMADEX  Take 20 mg by mouth 2 (two) times daily. ( 20 mg ) in the am ( 20 mg ) in the pm        Allergies:  Allergies  Allergen Reactions  . Other Hives    Poison ivy.     Past Medical History  Diagnosis Date  . Ischemic cardiomyopathy   . Type II diabetes mellitus   . Hypertension   . Hypercholesterolemia   . Lower GI bleeding 1980's    "when I was drinking alot" (06/06/2012)  . Arthritis     "both shoulders" (06/06/2012)  . Chronic combined systolic and diastolic CHF (congestive heart failure) 07/10/2012    a. EF 35-40%, + diastolic dysfunction by echo 12/2012.  Marland Kitchen GERD (gastroesophageal reflux disease) 07/12/2012  . OSA on CPAP     uses a cpap  . Myocardial infarction 11/13  . Coronary artery disease     a. Cath 05/2012 with CTO of RCA; s/p BMS to severe OM-1 stenosis.  Marland Kitchen History of alcoholism   . Sleep apnea   . Moderate aortic stenosis     a. Mod by echo 12/2012.  Marland Kitchen Pancreatitis   . Diverticulosis   . Personal history of rectal adenoma 05/28/2013  . Dietary noncompliance   . Morbid obesity   . Lymphoma     a. Chronic lymphocytic leukemia/small lymphocytic lymphoma. He is S/P B-R chemotherapy with complete response  to therapy.  . Shortness of breath     Past Surgical History  Procedure Laterality Date  . Coronary angioplasty with stent placement  06/06/2012    "1"  . Lymph gland excision  07/13/2012    Procedure: CERVICAL LYMPH  GLAND EXCISION;  Surgeon: Haywood Lasso, MD;  Location: [place];  Service: General;  Laterality: Right;  . Portacath placement  07/30/2012    Procedure: INSERTION PORT-A-CATH;  Surgeon: Haywood Lasso, MD;  Location: [place];  Service: General;  Laterality: Right;  . Colonoscopy N/A 05/28/2013    Procedure: COLONOSCOPY;  Surgeon: Gatha Mayer, MD;  Location: [facility];  Service: Endoscopy;  Laterality: N/A;    Family History  Problem Relation Age of Onset  . Osteoarthritis Mother   . Osteoarthritis Father   . Colon cancer Daughter   . Diabetes Father     entire family  . Heart disease Father     Social History:  reports that he quit smoking about 10 months ago. His smoking use included Cigarettes. He has a 53 pack-year smoking history. He has never used smokeless tobacco. He reports that he does not drink alcohol or use illicit drugs.    Review of Systems       Vision is normal. Most recent eye exam was in 6/15       Lipids: On Lipitor  20 mg daily       Lab Results  Component Value Date   CHOL 141 05/19/2014   HDL 31.50* 05/19/2014   LDLCALC 80 05/19/2014   TRIG 149.0 05/19/2014   CHOLHDL 4 05/19/2014                  Skin: No rash or infections     Thyroid:   Gets fatigue  Relatively easily, no history of thyroid disease.  Lab Results  Component Value Date   TSH 0.76 02/04/2014       The blood pressure has been treated with  Carvedilol and lisinopril     No  Recent swelling of feet.     Mild shortness of breath or chest tightness  on exertion.   He has a history of CHF followed by cardiology     Bowel habits: Normal.   No abdominal pain      No joint  pains.          No history of Numbness, tingling or burning in feet     LABS:  No visits with results within 1 Week(s) from this visit. Latest known visit with results is:  Appointment on 05/30/2014  Component Date Value Ref Range Status  . Sodium 05/30/2014 133* 135 - 145  mEq/L Final  . Potassium 05/30/2014 4.8  3.5 - 5.1 mEq/L Final  . Chloride 05/30/2014 100  96 - 112 mEq/L Final  . CO2 05/30/2014 24  19 - 32 mEq/L Final  . Glucose, Bld 05/30/2014 288* 70 - 99 mg/dL Final  . BUN 05/30/2014 72* 6 - 23 mg/dL Final  . Creatinine, Ser 05/30/2014 2.5* 0.4 - 1.5 mg/dL Final  . Calcium 05/30/2014 9.6  8.4 - 10.5 mg/dL Final  . GFR 05/30/2014 33.85* >60.00 mL/min Final    Physical Examination:  BP 108/60 mmHg  Pulse 75  Temp(Src) 98.2 F (36.8 C)  Resp 16  Ht $R'6\' 3"'gK$  (1.905 m)  Wt 301 lb 12.8 oz (136.896 kg)  BMI 37.72 kg/m2  SpO2 99%  GENERAL:  Patient has generalized obesity.   HEENT:         Eye exam shows normal external appearance. Fundus exam shows no retinopathy. Oral exam shows normal mucosa .  NECK:         General:  Neck exam shows no lymphadenopathy. Carotids are normal to palpation and no bruit heard.  Thyroid is not enlarged and no nodules felt.   LUNGS:         Chest is symmetrical. Lungs are clear to auscultation.Marland Kitchen   HEART:         Heart sounds:  S1 and S2 are normal. systmurmurs or clicks heard., no S3 or S4.   ABDOMEN:   There is no distention present. Liver and spleen are not palpable. No other mass or tenderness present.  EXTREMITIES:     There is no edema. No skin lesions present.Marland Kitchen  NEUROLOGICAL:   Vibration sense is  Absent in toes. Ankle jerks are absent bilaterally.          Diabetic foot exam shows normal monofilament sensation in the toes and plantar surfaces, no skin lesions or ulcers on the feet and absent pedal pulses MUSCULOSKELETAL:       There is no enlargement or deformity of the joints. Spine is normal to inspection.Marland Kitchen   SKIN:       No rash or lesions of concern.        ASSESSMENT:  Diabetes type 2, uncontrolled    He does have significant obesity with BMI 37 Currently only on low-dose basal insulin, considering his weight of 137 kg he may need at least 70 units of insulin for control Currently not benefiting  from glipizide and unlikely to be benefiting much from Januvia Most likely has postprandial hyperglycemia also which is not controlled with basal insulin Although he is generally eating low carbohydrate meals he does need to be instructed on balanced meal plans Currently has limited exercise ability and difficulty losing weight  Complications: none evident, has some signs of neuropathy but no sensory loss. No evidence for microalbuminuria but does have renal dysfunction for unknown reasons  Hypertension, history of diastolic CHF, currently well controlled  Dyslipidemia: Has low HDL despite control of HDL, taking Lipitor  PLAN:    Start  Checking his blood sugar  More consistently at various times of the day including after meals.  Needs to bring his monitor for download on each visit    He will start mealtime insulin with Humalog, using at least 10 units unless he is not eating any carbohydrates. Discussed principles of mealtime insulin, timing of Humalog insulin and duration of action as well as postprandial targets.  Introductory brochure on Humalog given  Coupon given for free box of Humalog insulin.  May consider using regular insulin next year for economy   Start increasing Lantus , to use 30 units for now and consider increasing further fasting reading consistently high.   May stop glipizide and he can also stop Januvia when finished.  Meanwhile he will need to reduce the dose to 50 mg because of his renal dysfunction   Encouraged him to walk as much as tolerated    balanced low fat meals.  Consider nutritional counseling with dietitian also appointment with diabetes nurse educator.     Patient Instructions  Please check blood sugars at least half the time about 2 hours after any meal and 3-4 times per week on waking up.  Please bring blood sugar monitor to each visit  Humalog  10 units with main meal or if eating sandwich/starchy food and see if sugar after meal is still in  100-180 range  Lantus 30 units daily  Januvia 1/2 daily, stop Glipizide   Exercise as tolerated   Counseling time over 50% of today's 45 minute visit  Audi Wettstein 06/06/2014, 2:18 PM   Note: This office note was prepared with Dragon voice recognition system technology. Any transcriptional errors that result from this process are unintentional.

## 2014-06-09 ENCOUNTER — Other Ambulatory Visit: Payer: Self-pay | Admitting: *Deleted

## 2014-06-09 ENCOUNTER — Other Ambulatory Visit: Payer: Self-pay | Admitting: Internal Medicine

## 2014-06-09 DIAGNOSIS — N289 Disorder of kidney and ureter, unspecified: Secondary | ICD-10-CM

## 2014-06-09 DIAGNOSIS — E119 Type 2 diabetes mellitus without complications: Secondary | ICD-10-CM

## 2014-06-09 MED ORDER — INSULIN GLARGINE 100 UNIT/ML ~~LOC~~ SOLN: 35.0000 [IU] | Freq: Every day | SUBCUTANEOUS | Status: DC

## 2014-06-10 ENCOUNTER — Other Ambulatory Visit: Payer: Self-pay

## 2014-06-23 ENCOUNTER — Ambulatory Visit (HOSPITAL_BASED_OUTPATIENT_CLINIC_OR_DEPARTMENT_OTHER): Payer: Commercial Managed Care - HMO

## 2014-06-23 ENCOUNTER — Telehealth: Payer: Self-pay | Admitting: Pulmonary Disease

## 2014-06-23 VITALS — BP 117/69 | HR 81 | Temp 97.5°F

## 2014-06-23 DIAGNOSIS — C9110 Chronic lymphocytic leukemia of B-cell type not having achieved remission: Secondary | ICD-10-CM

## 2014-06-23 DIAGNOSIS — Z95828 Presence of other vascular implants and grafts: Secondary | ICD-10-CM

## 2014-06-23 DIAGNOSIS — Z452 Encounter for adjustment and management of vascular access device: Secondary | ICD-10-CM

## 2014-06-23 MED ORDER — HEPARIN SOD (PORK) LOCK FLUSH 100 UNIT/ML IV SOLN
500.0000 [IU] | Freq: Once | INTRAVENOUS | Status: AC
  Administered 2014-06-23: 500 [IU] via INTRAVENOUS
  Filled 2014-06-23: qty 5

## 2014-06-23 MED ORDER — SODIUM CHLORIDE 0.9 % IJ SOLN
10.0000 mL | INTRAMUSCULAR | Status: DC | PRN
  Administered 2014-06-23: 10 mL via INTRAVENOUS
  Filled 2014-06-23: qty 10

## 2014-06-23 NOTE — Patient Instructions (Signed)

## 2014-06-23 NOTE — Telephone Encounter (Signed)
lmtcb x1 

## 2014-06-24 ENCOUNTER — Telehealth: Payer: Self-pay | Admitting: Endocrinology

## 2014-06-24 NOTE — Telephone Encounter (Signed)
Patient would like to know if it is ok to eat peanuts

## 2014-06-24 NOTE — Telephone Encounter (Signed)
Small amounts only, can see dietician if he wants for meal planning

## 2014-06-24 NOTE — Telephone Encounter (Signed)
Left message on patients vm

## 2014-06-24 NOTE — Telephone Encounter (Signed)
Please see below.

## 2014-06-24 NOTE — Telephone Encounter (Signed)
Pt states that he was having some issues with [employer] and their billing department but he just spoke with them and settled it.  Pt overdue for f/u with Dr Gwenette Greet.  Appt scheduled.

## 2014-06-25 ENCOUNTER — Other Ambulatory Visit (INDEPENDENT_AMBULATORY_CARE_PROVIDER_SITE_OTHER): Payer: Commercial Managed Care - HMO | Admitting: *Deleted

## 2014-06-25 DIAGNOSIS — R0600 Dyspnea, unspecified: Secondary | ICD-10-CM

## 2014-06-25 DIAGNOSIS — N289 Disorder of kidney and ureter, unspecified: Secondary | ICD-10-CM

## 2014-06-25 LAB — BASIC METABOLIC PANEL
BUN: 55 mg/dL — ABNORMAL HIGH (ref 6–23)
CO2: 24 mEq/L (ref 19–32)
Calcium: 9.1 mg/dL (ref 8.4–10.5)
Chloride: 104 mEq/L (ref 96–112)
Creatinine, Ser: 1.9 mg/dL — ABNORMAL HIGH (ref 0.4–1.5)
GFR: 46.45 mL/min — ABNORMAL LOW (ref >60.00)
Glucose, Bld: 128 mg/dL — ABNORMAL HIGH (ref 70–99)
Potassium: 5.1 mEq/L (ref 3.5–5.1)
Sodium: 135 mEq/L (ref 135–145)

## 2014-06-25 LAB — BRAIN NATRIURETIC PEPTIDE: Pro B Natriuretic peptide (BNP): 102 pg/mL — ABNORMAL HIGH (ref 0.0–100.0)

## 2014-06-26 ENCOUNTER — Encounter (HOSPITAL_COMMUNITY): Payer: Self-pay | Admitting: Internal Medicine

## 2014-06-26 ENCOUNTER — Ambulatory Visit: Payer: Commercial Managed Care - HMO | Admitting: Pulmonary Disease

## 2014-06-30 ENCOUNTER — Ambulatory Visit: Payer: Commercial Managed Care - HMO | Admitting: Endocrinology

## 2014-06-30 ENCOUNTER — Encounter: Payer: Self-pay | Admitting: Cardiovascular Disease

## 2014-07-17 ENCOUNTER — Ambulatory Visit (INDEPENDENT_AMBULATORY_CARE_PROVIDER_SITE_OTHER): Payer: Commercial Managed Care - HMO | Admitting: Endocrinology

## 2014-07-17 ENCOUNTER — Encounter: Payer: Self-pay | Admitting: Endocrinology

## 2014-07-17 VITALS — BP 128/72 | HR 84 | Temp 98.2°F | Resp 16 | Ht 75.0 in | Wt 301.0 lb

## 2014-07-17 DIAGNOSIS — I10 Essential (primary) hypertension: Secondary | ICD-10-CM

## 2014-07-17 DIAGNOSIS — IMO0002 Reserved for concepts with insufficient information to code with codable children: Secondary | ICD-10-CM

## 2014-07-17 DIAGNOSIS — E1165 Type 2 diabetes mellitus with hyperglycemia: Secondary | ICD-10-CM

## 2014-07-17 NOTE — Patient Instructions (Addendum)
Lantus 36 units in am daily  Humalog 10 units for average meals and 12 units for larger meals with more than 1 starch or higher fat intake  Please check blood sugars at least half the time about 2 hours after any meal and 3-4 times per week on waking up. Please bring blood sugar monitor to each visit Target sugars in am 90-130 and not over 180 after meals  Stop Januvia when out

## 2014-07-17 NOTE — Progress Notes (Signed)
Patient ID: [name] [name], male   DOB: 1946/05/05, 68 y.o.   MRN: [id number]           Reason for Appointment: F/u for Type 2 Diabetes  Referring physician:  Cathlean Cower  History of Present Illness:          Diagnosis: Type 2 diabetes mellitus, date of diagnosis: 2005       Past history:    In the past  He has had treatment with metformin, glipizide and Januvia   Metformin was stopped when he had worsening renal function  Overall he has had been consistently controlled diabetes.  Lowest A1c has been 7.8,  This was in 2013 In 7/15 his glucose was 538 with his regimen of oral agents and he was started on Lantus insulin  Recent history: On his initial consultation in 11/15 his Lantus dose was increased and he was started on 10 units of Humalog to cover his meals He was told to take Lantus 30 units once a day but he is taking 15 twice a day on his own He had been checking blood sugars sporadically previously and they were as high as 280 Glipizide was stopped and Januvia continued He was advised to try and improve his diet and start exercising to lose weight but has not done much Despite increasing the insulin and mealtime coverage his blood sugars are still about the same with the morning readings averaging just over 200 and also averaging about the same throughout the day  He has difficulty losing weight He is still not very compliant with his diet  especially with eating snacks at night He still has not made an appointment with the dietitian, referral was made       Oral hypoglycemic drugs the patient is taking are:  Januvia       Side effects from medications have been: none INSULIN regimen is described as: 15 Lantus bid, Humalog 10 units before meals 3 times a day  Compliance with the medical regimen: fair  Hypoglycemia:  none   Glucose monitoring:  done one time a day         Glucometer: Accucheck     Blood Glucose readings by recall in am: 180-280  PRE-MEAL Breakfast Lunch  Dinner Bedtime Overall  Glucose range   141-327  310   137-270   186    Mean/median:  205    218   208   Self-care: The diet that the patient has been following is: tries to limit .     Meals: 3 meals per day. Breakfast is usually bacon and eggs and grits.  He is eating more salads and occasionally will make a meal of this.  Otherwise will have fresh or fruit as well as cashew nuts at meals or snacks           Exercise: unable to do much         Dietician visit, most recent: never.               Weight history:  Previous range:250-325  Wt Readings from Last 3 Encounters:  07/17/14 301 lb (136.533 kg)  06/06/14 301 lb 12.8 oz (136.896 kg)  05/19/14 301 lb (136.533 kg)    Glycemic control:   Lab Results  Component Value Date   HGBA1C 12.6* 06/06/2014   HGBA1C 18.5* 02/04/2014   HGBA1C 8.7* 06/26/2013   Lab Results  Component Value Date   MICROALBUR 0.3 02/04/2014   [place] 80 05/19/2014  CREATININE 1.9* 06/25/2014         Medication List       This list is accurate as of: 07/17/14  9:35 AM.  Always use your most recent med list.               ACCU-CHEK AVIVA PLUS W/DEVICE Kit  - Use to check blood sugars twice a day  - Dx 250.00     albuterol 108 (90 BASE) MCG/ACT inhaler  Commonly known as:  PROVENTIL HFA;VENTOLIN HFA  Inhale 2 puffs into the lungs every 6 (six) hours as needed for wheezing or shortness of breath.     AMBULATORY NON FORMULARY MEDICATION  Home O2 @@ 2 LMP for 8 hrs at night     AMBULATORY NON FORMULARY MEDICATION  CPAP     aspirin EC 81 MG tablet  Take 81 mg by mouth daily.     atorvastatin 20 MG tablet  Commonly known as:  LIPITOR  Take 1 tablet (20 mg total) by mouth daily at 6 PM.     carvedilol 12.5 MG tablet  Commonly known as:  COREG  Take 1 tablet (12.5 mg total) by mouth 2 (two) times daily.     glipiZIDE 10 MG tablet  Commonly known as:  GLUCOTROL  Take 1 tablet (10 mg total) by mouth 2 (two) times daily before a meal.      glucose blood test strip  Commonly known as:  ACCU-CHEK AVIVA PLUS  - Use to check blood sugars twice a day  - Dx E11.9     insulin glargine 100 UNIT/ML injection  Commonly known as:  LANTUS  Inject 0.35 mLs (35 Units total) into the skin at bedtime.     insulin lispro 100 UNIT/ML KiwkPen  Commonly known as:  HUMALOG KWIKPEN  Inject 10 units three times a day     Insulin Pen Needle 32G X 4 MM Misc  Commonly known as:  BD PEN NEEDLE NANO U/F  Use as directed 1 per day   250.02     lidocaine-prilocaine cream  Commonly known as:  EMLA  Apply 1 application topically as needed.     lisinopril 10 MG tablet  Commonly known as:  PRINIVIL,ZESTRIL  Take 1 tablet (10 mg total) by mouth daily.     potassium chloride 10 MEQ tablet  Commonly known as:  K-DUR  Take 2 tablets (20 mEq total) by mouth daily.     sitaGLIPtin 100 MG tablet  Commonly known as:  JANUVIA  Take 1 tablet (100 mg total) by mouth daily.     tiotropium 18 MCG inhalation capsule  Commonly known as:  SPIRIVA  Place 18 mcg into inhaler and inhale daily as needed. Uses for shortness of breath.     torsemide 20 MG tablet  Commonly known as:  DEMADEX  Take 20 mg by mouth daily.        Allergies:  Allergies  Allergen Reactions  . Other Hives    Poison ivy.     Past Medical History  Diagnosis Date  . Ischemic cardiomyopathy   . Type II diabetes mellitus   . Hypertension   . Hypercholesterolemia   . Lower GI bleeding 1980's    "when I was drinking alot" (06/06/2012)  . Arthritis     "both shoulders" (06/06/2012)  . Chronic combined systolic and diastolic CHF (congestive heart failure) 07/10/2012    a. EF 19-14%, + diastolic dysfunction by echo 12/2012.  Marland Kitchen GERD (gastroesophageal reflux  disease) 07/12/2012  . OSA on CPAP     uses a cpap  . Myocardial infarction 11/13  . Coronary artery disease     a. Cath 05/2012 with CTO of RCA; s/p BMS to severe OM-1 stenosis.  Marland Kitchen History of alcoholism   . Sleep  apnea   . Moderate aortic stenosis     a. Mod by echo 12/2012.  Marland Kitchen Pancreatitis   . Diverticulosis   . Personal history of rectal adenoma 05/28/2013  . Dietary noncompliance   . Morbid obesity   . Lymphoma     a. 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  . Shortness of breath     Past Surgical History  Procedure Laterality Date  . Coronary angioplasty with stent placement  06/06/2012    "1"  . Lymph gland excision  07/13/2012    Procedure: CERVICAL LYMPH GLAND EXCISION;  Surgeon: Haywood Lasso, MD;  Location: [place];  Service: General;  Laterality: Right;  . Portacath placement  07/30/2012    Procedure: INSERTION PORT-A-CATH;  Surgeon: Haywood Lasso, MD;  Location: [place];  Service: General;  Laterality: Right;  . Colonoscopy N/A 05/28/2013    Procedure: COLONOSCOPY;  Surgeon: Gatha Mayer, MD;  Location: [facility];  Service: Endoscopy;  Laterality: N/A;  . Left and right heart catheterization with coronary angiogram N/A 06/06/2012    Procedure: LEFT AND RIGHT HEART CATHETERIZATION WITH CORONARY ANGIOGRAM;  Surgeon: Pixie Casino, MD;  Location: [facility] CATH LAB;  Service: Cardiovascular;  Laterality: N/A;  . Percutaneous coronary stent intervention (pci-s)  06/06/2012    Procedure: PERCUTANEOUS CORONARY STENT INTERVENTION (PCI-S);  Surgeon: Troy Sine, MD;  Location: [facility] C[facility] LAB;  Service: Cardiovascular;;    Family History  Problem Relation Age of Onset  . Osteoarthritis Mother   . Osteoarthritis Father   . Colon cancer Daughter   . Diabetes Father     entire family  . Heart disease Father     Social History:  reports that he quit smoking about a year ago. His smoking use included Cigarettes. He has a 53 pack-year smoking history. He has never used smokeless tobacco. He reports that he does not drink alcohol or use illicit drugs.    Review of Systems       Vision  is normal. Most recent eye exam was in 6/15       Lipids: On Lipitor  20 mg daily       Lab Results  Component Value Date   CHOL 141 05/19/2014   HDL 31.50* 05/19/2014   LDLCALC 80 05/19/2014   TRIG 149.0 05/19/2014   CHOLHDL 4 05/19/2014                   Thyroid:   Gets fatigue  Relatively easily, no history of thyroid disease.  Lab Results  Component Value Date   TSH 0.76 02/04/2014       The blood pressure has been treated with  Carvedilol and lisinopril and appears controlled  Renal dysfunction: Somewhat better with reducing diuretics   LABS:  No visits with results within 1 Week(s) from this visit. Latest known visit with results is:  Lab on 06/25/2014  Component Date Value Ref Range Status  . Sodium 06/25/2014 135  135 - 145 mEq/L Final  . Potassium 06/25/2014 5.1  3.5 - 5.1 mEq/L Final  . Chloride 06/25/2014 104  96 - 112 mEq/L  Final  . CO2 06/25/2014 24  19 - 32 mEq/L Final  . Glucose, Bld 06/25/2014 128* 70 - 99 mg/dL Final  . BUN 06/25/2014 55* 6 - 23 mg/dL Final  . Creatinine, Ser 06/25/2014 1.9* 0.4 - 1.5 mg/dL Final  . Calcium 06/25/2014 9.1  8.4 - 10.5 mg/dL Final  . GFR 06/25/2014 46.45* >60.00 mL/min Final  . Pro B Natriuretic peptide (BNP) 06/25/2014 102.0* 0.0 - 100.0 pg/mL Final    Physical Examination:  BP 128/72 mmHg  Pulse 84  Temp(Src) 98.2 F (36.8 C)  Resp 16  Ht $R'6\' 3"'Og$  (1.905 m)  Wt 301 lb (136.533 kg)  BMI 37.62 kg/m2  SpO2 99%     ASSESSMENT:  Diabetes type 2, uncontrolled    He does have significant obesity with BMI 37 Currently his blood sugars are still mostly over 200 despite increasing his insulin and starting mealtime coverage Most of his sugars are around 200 and relatively higher fasting usually also He is still not very compliant with his diet  especially with eating snacks at night He has only occasional readings that are fairly good the morning or evening Currently has limited exercise ability and difficulty  losing weight   PLAN:   Reduced snacking at night  May adjust Humalog at mealtimes based on amount of carbohydrate or size of meal    Start increasing Lantus , to use 36 units for now and consider increasing further fasting reading consistently high.  Encouraged him to walk as much as tolerated   Keep appointment for nutritional counseling with dietitian next month  also appointment with diabetes nurse educator to be made on his next visit for further reinforcement and education about insulin.     Patient Instructions  Lantus 36 units in am daily  Humalog 10 units for average meals and 12 units for larger meals with more than 1 starch or higher fat intake  Please check blood sugars at least half the time about 2 hours after any meal and 3-4 times per week on waking up. Please bring blood sugar monitor to each visit Target sugars in am 90-130 and not over 180 after meals   Counseling time over 50% of today's 45 minute visit  Juhi Lagrange 07/17/2014, 9:35 AM   Note: This office note was prepared with Estate agent. Any transcriptional errors that result from this process are unintentional.

## 2014-07-23 ENCOUNTER — Ambulatory Visit: Payer: Commercial Managed Care - HMO | Admitting: Dietician

## 2014-08-02 ENCOUNTER — Other Ambulatory Visit: Payer: Self-pay | Admitting: Internal Medicine

## 2014-08-07 ENCOUNTER — Ambulatory Visit: Payer: Commercial Managed Care - HMO | Admitting: Internal Medicine

## 2014-08-12 ENCOUNTER — Ambulatory Visit (INDEPENDENT_AMBULATORY_CARE_PROVIDER_SITE_OTHER)
Admission: RE | Admit: 2014-08-12 | Discharge: 2014-08-12 | Disposition: A | Discharge status: Home / Self Care | Payer: Commercial Managed Care - HMO | Source: Ambulatory Visit | Attending: Internal Medicine | Admitting: Internal Medicine

## 2014-08-12 ENCOUNTER — Ambulatory Visit (INDEPENDENT_AMBULATORY_CARE_PROVIDER_SITE_OTHER): Payer: Commercial Managed Care - HMO | Admitting: Internal Medicine

## 2014-08-12 ENCOUNTER — Encounter: Payer: Self-pay | Admitting: Internal Medicine

## 2014-08-12 VITALS — BP 120/62 | HR 82 | Temp 97.9°F | Ht 75.0 in | Wt 305.0 lb

## 2014-08-12 DIAGNOSIS — I10 Essential (primary) hypertension: Secondary | ICD-10-CM

## 2014-08-12 DIAGNOSIS — R0600 Dyspnea, unspecified: Secondary | ICD-10-CM

## 2014-08-12 DIAGNOSIS — E785 Hyperlipidemia, unspecified: Secondary | ICD-10-CM

## 2014-08-12 DIAGNOSIS — Z0189 Encounter for other specified special examinations: Secondary | ICD-10-CM

## 2014-08-12 DIAGNOSIS — Z0000 Encounter for general adult medical examination without abnormal findings: Secondary | ICD-10-CM

## 2014-08-12 NOTE — Progress Notes (Signed)
Subjective:    Patient ID: [name] [name], male    DOB: [birth year], 69 y.o.   MRN: [id number]  HPI  Here to f/u; overall doing ok,  Pt denies chest pain, increased sob or doe, wheezing, orthopnea, PND, increased LE swelling, palpitations, dizziness or syncope, excet for some doe exertion this am for first time in long time, took extra torsemide pill this am.  Wt Readings from Last 3 Encounters:  08/12/14 305 lb (138.347 kg)  07/17/14 301 lb (136.533 kg)  06/06/14 301 lb 12.8 oz (136.896 kg)  Is 4 lbs up from dec 31.   Pt denies polydipsia, polyuria, or low sugar symptoms such as weakness or confusion improved with po intake.  Pt denies new neurological symptoms such as new headache, or facial or extremity weakness or numbness.   Pt states overall good compliance with meds, has been trying to follow lower cholesterol, diabetic diet, with wt overall stable,  but little exercise however.  Did require hospn for volume overload after drinking plenty or fluids at home, now not doing this. Seemed to occur along with eating more and smoking after realized his daughter at 60yo was not likely his biological daughter, she refused DNA testing.  Plans to be more active soon with better weather.  Sees Dr Acie Fredrickson for cardiac, appt next wk, also endo in 2 wks, also for CT next wk per oncology as well  Past Medical History  Diagnosis Date  . Ischemic cardiomyopathy   . Type II diabetes mellitus   . Hypertension   . Hypercholesterolemia   . Lower GI bleeding 1980's    "when I was drinking alot" (06/06/2012)  . Arthritis     "both shoulders" (06/06/2012)  . Chronic combined systolic and diastolic CHF (congestive heart failure) 07/10/2012    a. EF 19-50%, + diastolic dysfunction by echo 12/2012.  Marland Kitchen GERD (gastroesophageal reflux disease) 07/12/2012  . OSA on CPAP     uses a cpap  . Myocardial infarction 11/13  . Coronary artery disease     a. Cath 05/2012 with CTO of RCA; s/p BMS to severe OM-1 stenosis.  Marland Kitchen  History of alcoholism   . Sleep apnea   . Moderate aortic stenosis     a. Mod by echo 12/2012.  Marland Kitchen Pancreatitis   . Diverticulosis   . Personal history of rectal adenoma 05/28/2013  . Dietary noncompliance   . Morbid obesity   . Lymphoma     a. 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  . Shortness of breath    Past Surgical History  Procedure Laterality Date  . Coronary angioplasty with stent placement  06/06/2012    "1"  . Lymph gland excision  07/13/2012    Procedure: CERVICAL LYMPH GLAND EXCISION;  Surgeon: Haywood Lasso, MD;  Location: [place];  Service: General;  Laterality: Right;  . Portacath placement  07/30/2012    Procedure: INSERTION PORT-A-CATH;  Surgeon: Haywood Lasso, MD;  Location: [place];  Service: General;  Laterality: Right;  . Colonoscopy N/A 05/28/2013    Procedure: COLONOSCOPY;  Surgeon: Gatha Mayer, MD;  Location: [facility];  Service: Endoscopy;  Laterality: N/A;  . Left and right heart catheterization with coronary angiogram N/A 06/06/2012    Procedure: LEFT AND RIGHT HEART CATHETERIZATION WITH CORONARY ANGIOGRAM;  Surgeon: Pixie Casino, MD;  [facility] CATH LAB;  Service: Cardiovascular;  Laterality: N/A;  . Percutaneous coronary stent  intervention (pci-s)  06/06/2012    Procedure: PERCUTANEOUS CORONARY STENT INTERVENTION (PCI-S);  Surgeon: Troy Sine, MD;  Location: [facility] CATH LAB;  Service: Cardiovascular;;    reports that he quit smoking about 12 months ago. His smoking use included Cigarettes. He has a 53 pack-year smoking history. He has never used smokeless tobacco. He reports that he does not drink alcohol or use illicit drugs. family history includes Colon cancer in his daughter; Diabetes in his father; Heart disease in his father; Osteoarthritis in his father and mother. Allergies  Allergen Reactions  . Other Hives    Poison ivy.     Current Outpatient Prescriptions on File Prior to Visit  Medication Sig Dispense Refill  . albuterol (PROVENTIL HFA;VENTOLIN HFA) 108 (90 BASE) MCG/ACT inhaler Inhale 2 puffs into the lungs every 6 (six) hours as needed for wheezing or shortness of breath. 1 Inhaler 11  . aspirin EC 81 MG tablet Take 81 mg by mouth daily.    Marland Kitchen atorvastatin (LIPITOR) 20 MG tablet Take 1 tablet (20 mg total) by mouth daily at 6 PM. 90 tablet 3  . Blood Glucose Monitoring Suppl (ACCU-CHEK AVIVA PLUS) W/DEVICE KIT Use to check blood sugars twice a day Dx 250.00 1 kit 0  . carvedilol (COREG) 12.5 MG tablet Take 1 tablet (12.5 mg total) by mouth 2 (two) times daily. 180 tablet 3  . glucose blood (ACCU-CHEK AVIVA PLUS) test strip Use to check blood sugars twice a day Dx E11.9 60 each 3  . insulin glargine (LANTUS) 100 UNIT/ML injection Inject 0.35 mLs (35 Units total) into the skin at bedtime. (Patient taking differently: Inject 15 Units into the skin 2 (two) times daily. ) 10 mL 11  . insulin lispro (HUMALOG KWIKPEN) 100 UNIT/ML KiwkPen Inject 10 units three times a day (Patient taking differently: 15 Units 2 (two) times daily. ) 15 mL 3  . Insulin Pen Needle (BD PEN NEEDLE NANO U/F) 32G X 4 MM MISC Use as directed 1 per day   250.02 100 each 11  . lidocaine-prilocaine (EMLA) cream Apply 1 application topically as needed. 30 g 3  . lisinopril (PRINIVIL,ZESTRIL) 10 MG tablet Take 1 tablet (10 mg total) by mouth daily. 30 tablet 11  . potassium chloride (K-DUR) 10 MEQ tablet Take 2 tablets (20 mEq total) by mouth daily. 180 tablet 1  . tiotropium (SPIRIVA) 18 MCG inhalation capsule Place 18 mcg into inhaler and inhale daily as needed. Uses for shortness of breath.    . torsemide (DEMADEX) 20 MG tablet Take 20 mg by mouth daily.    . AMBULATORY NON FORMULARY MEDICATION Home O2 @@ 2 LMP for 8 hrs at night    . AMBULATORY NON FORMULARY MEDICATION CPAP    . glipiZIDE (GLUCOTROL) 10 MG tablet Take 1 tablet (10 mg total)  by mouth 2 (two) times daily before a meal. (Patient not taking: Reported on 07/17/2014) 180 tablet 3  . sitaGLIPtin (JANUVIA) 100 MG tablet Take 1 tablet (100 mg total) by mouth daily. (Patient not taking: Reported on 08/12/2014) 30 tablet 5   Current Facility-Administered Medications on File Prior to Visit  Medication Dose Route Frequency Provider Last Rate Last Dose  . sodium chloride 0.9 % injection 10 mL  10 mL Intravenous PRN [name], MD   10 mL at 04/25/13 1100   Review of Systems  Constitutional: Negative for unusual diaphoresis or other sweats  HENT: Negative for ringing in ear Eyes: Negative for double vision or  worsening visual disturbance.  Respiratory: Negative for choking and stridor.   Gastrointestinal: Negative for vomiting or other signifcant bowel change Genitourinary: Negative for hematuria or decreased urine volume.  Musculoskeletal: Negative for other MSK pain or swelling Skin: Negative for color change and worsening wound.  Neurological: Negative for tremors and numbness other than noted  Psychiatric/Behavioral: Negative for decreased concentration or agitation other than above       Objective:   Physical Exam BP 120/62 mmHg  Pulse 82  Temp(Src) 97.9 F (36.6 C) (Oral)  Ht $R'6\' 3"'tZ$  (1.905 m)  Wt 305 lb (138.347 kg)  BMI 38.12 kg/m2  SpO2 93% VS noted, not ill appearing Constitutional: Pt appears well-developed, well-nourished./obese  HENT: Head: NCAT.  Right Ear: External ear normal.  Left Ear: External ear normal.  Eyes: . Pupils are equal, round, and reactive to light. Conjunctivae and EOM are normal Neck: Normal range of motion. Neck supple.  Cardiovascular: Normal rate and regular rhythm.   Pulmonary/Chest: Effort normal and breath sounds without rales or wheezing.  Neurological: Pt is alert. Not confused , motor grossly intact Skin: Skin is warm. No signficant pedial edema Psychiatric: Pt behavior is normal. No agitation.     Assessment & Plan:

## 2014-08-12 NOTE — Patient Instructions (Signed)
Please continue all other medications as before, and refills have been done if requested.  Please have the pharmacy call with any other refills you may need.  Please continue your efforts at being more active, low cholesterol diet, and weight control.  You are otherwise up to date with prevention measures today.  Please keep your appointments with your specialists as you may have planned  Please go to the XRAY Department in the Basement (go straight as you get off the elevator) for the x-ray testing  You will be contacted by phone if any changes need to be made immediately.  Otherwise, you will receive a letter about your results with an explanation, but please check with MyChart first.  Please remember to sign up for MyChart if you have not done so, as this will be important to you in the future with finding out test results, communicating by private email, and scheduling acute appointments online when needed.  Please return in 6 months, or sooner if needed, with Lab testing done 3-5 days before  

## 2014-08-12 NOTE — Assessment & Plan Note (Signed)
Wt up 4 lbs only, no signficant edema and pulm exam without overt edema, for cxr to help with volume status, cont torsemide 1 qd for now, f/u card next wk as planned

## 2014-08-12 NOTE — Assessment & Plan Note (Signed)
stable overall by history and exam, recent data reviewed with pt, and pt to continue medical treatment as before,  to f/u any worsening symptoms or concerns Lab Results  Component Value Date   LDLCALC 80 05/19/2014

## 2014-08-12 NOTE — Progress Notes (Signed)
Pre visit review using our clinic review tool, if applicable. No additional management support is needed unless otherwise documented below in the visit note. 

## 2014-08-12 NOTE — Assessment & Plan Note (Signed)
stable overall by history and exam, recent data reviewed with pt, and pt to continue medical treatment as before,  to f/u any worsening symptoms or concerns BP Readings from Last 3 Encounters:  08/12/14 120/62  07/17/14 128/72  06/23/14 117/69

## 2014-08-18 ENCOUNTER — Other Ambulatory Visit: Payer: Self-pay | Admitting: *Deleted

## 2014-08-18 DIAGNOSIS — C259 Malignant neoplasm of pancreas, unspecified: Secondary | ICD-10-CM

## 2014-08-19 ENCOUNTER — Ambulatory Visit: Payer: Commercial Managed Care - HMO

## 2014-08-19 ENCOUNTER — Other Ambulatory Visit (HOSPITAL_BASED_OUTPATIENT_CLINIC_OR_DEPARTMENT_OTHER): Payer: Commercial Managed Care - HMO

## 2014-08-19 ENCOUNTER — Ambulatory Visit (HOSPITAL_COMMUNITY)
Admission: RE | Admit: 2014-08-19 | Discharge: 2014-08-19 | Disposition: A | Discharge status: Home / Self Care | Payer: Commercial Managed Care - HMO | Source: Ambulatory Visit | Attending: Oncology | Admitting: Oncology

## 2014-08-19 DIAGNOSIS — C9110 Chronic lymphocytic leukemia of B-cell type not having achieved remission: Secondary | ICD-10-CM

## 2014-08-19 DIAGNOSIS — C259 Malignant neoplasm of pancreas, unspecified: Secondary | ICD-10-CM

## 2014-08-19 DIAGNOSIS — Z9221 Personal history of antineoplastic chemotherapy: Secondary | ICD-10-CM | POA: Diagnosis not present

## 2014-08-19 DIAGNOSIS — C8590 Non-Hodgkin lymphoma, unspecified, unspecified site: Secondary | ICD-10-CM | POA: Diagnosis present

## 2014-08-19 LAB — COMPREHENSIVE METABOLIC PANEL (CC13)
ALBUMIN: 4 g/dL (ref 3.5–5.0)
ALT: 15 U/L (ref 0–55)
AST: 13 U/L (ref 5–34)
Alkaline Phosphatase: 67 U/L (ref 40–150)
Anion Gap: 12 mEq/L — ABNORMAL HIGH (ref 3–11)
BILIRUBIN TOTAL: 0.5 mg/dL (ref 0.20–1.20)
BUN: 45.5 mg/dL — ABNORMAL HIGH (ref 7.0–26.0)
CALCIUM: 8.8 mg/dL (ref 8.4–10.4)
CO2: 25 meq/L (ref 22–29)
CREATININE: 1.7 mg/dL — AB (ref 0.7–1.3)
Chloride: 102 mEq/L (ref 98–109)
EGFR: 48 mL/min/{1.73_m2} — AB (ref >90)
Glucose: 163 mg/dl — ABNORMAL HIGH (ref 70–140)
Potassium: 4.4 mEq/L (ref 3.5–5.1)
Sodium: 139 mEq/L (ref 136–145)
TOTAL PROTEIN: 7.1 g/dL (ref 6.4–8.3)

## 2014-08-19 LAB — CBC WITH DIFFERENTIAL/PLATELET
BASO%: 0.9 % (ref 0.0–2.0)
Basophils Absolute: 0.1 10*3/uL (ref 0.0–0.1)
EOS%: 4.6 % (ref 0.0–7.0)
Eosinophils Absolute: 0.3 10*3/uL (ref 0.0–0.5)
HCT: 35.8 % — ABNORMAL LOW (ref 38.4–49.9)
HEMOGLOBIN: 11.5 g/dL — AB (ref 13.0–17.1)
LYMPH#: 1.6 10*3/uL (ref 0.9–3.3)
LYMPH%: 21.7 % (ref 14.0–49.0)
MCH: 28.8 pg (ref 27.2–33.4)
MCHC: 32.2 g/dL (ref 32.0–36.0)
MCV: 89.4 fL (ref 79.3–98.0)
MONO#: 0.7 10*3/uL (ref 0.1–0.9)
MONO%: 9.2 % (ref 0.0–14.0)
NEUT#: 4.7 10*3/uL (ref 1.5–6.5)
NEUT%: 63.6 % (ref 39.0–75.0)
PLATELETS: 209 10*3/uL (ref 140–400)
RBC: 4 10*6/uL — ABNORMAL LOW (ref 4.20–5.82)
RDW: 15.5 % — AB (ref 11.0–14.6)
WBC: 7.4 10*3/uL (ref 4.0–10.3)

## 2014-08-21 ENCOUNTER — Ambulatory Visit (HOSPITAL_BASED_OUTPATIENT_CLINIC_OR_DEPARTMENT_OTHER): Payer: Commercial Managed Care - HMO | Admitting: Oncology

## 2014-08-21 ENCOUNTER — Telehealth: Payer: Self-pay | Admitting: Oncology

## 2014-08-21 VITALS — BP 156/58 | HR 77 | Temp 97.5°F | Resp 18 | Ht 75.0 in | Wt 308.8 lb

## 2014-08-21 DIAGNOSIS — C9110 Chronic lymphocytic leukemia of B-cell type not having achieved remission: Secondary | ICD-10-CM

## 2014-08-21 DIAGNOSIS — C8590 Non-Hodgkin lymphoma, unspecified, unspecified site: Secondary | ICD-10-CM

## 2014-08-21 DIAGNOSIS — R0602 Shortness of breath: Secondary | ICD-10-CM

## 2014-08-21 NOTE — Telephone Encounter (Signed)
gv adn printed appt sched and avs for pt for March May and July

## 2014-08-21 NOTE — Progress Notes (Signed)
Hematology and Oncology Follow Up Visit  [name] [id number] [birth year] 69 y.o. 08/21/2014 9:06 AM   Principle Diagnosis: 69 year old with stage IIIA SLL diagnosed in 06/2012. He presented with abdominal pain and diffuse lymphadenopathy.   Prior Therapy:  He is S/P lymph node biopsy that was done on 07/13/2012.  He is S/P 6 cycles of B-R started in 07/2012. He completed cycle 6 on 12/2012.    Current therapy: Observation and follow up.   Interim History: [name] [name] presents today for a follow up visit by him self. Since his last visit, he reports no new complaints. He still have some lethargy and some dyspnea on exertion. He continues to gain weight and have contributed to his overall shortness of breath and movement decrease in mobility. He is not reporting any abdominal pain or early satiety. He is not reporting any nausea or vomiting is not reporting any fevers or chills. No evidence to suggest relapsed disease. His appetite is reasonable. He has not reported any lymphadenopathy or petechiae. He has not reported any frequency urgency or hesitancy. Does not report any constitutional symptoms. He does not report any headaches or blurry vision or syncope. Rest of his review of systems unremarkable  Medications: I have reviewed the patient's current medications. Current Outpatient Prescriptions  Medication Sig Dispense Refill  . albuterol (PROVENTIL HFA;VENTOLIN HFA) 108 (90 BASE) MCG/ACT inhaler Inhale 2 puffs into the lungs every 6 (six) hours as needed for wheezing or shortness of breath. 1 Inhaler 11  . AMBULATORY NON FORMULARY MEDICATION Home O2 @@ 2 LMP for 8 hrs at night    . AMBULATORY NON FORMULARY MEDICATION CPAP    . aspirin EC 81 MG tablet Take 81 mg by mouth daily.    Marland Kitchen atorvastatin (LIPITOR) 20 MG tablet Take 1 tablet (20 mg total) by mouth daily at 6 PM. 90 tablet 3  . Blood Glucose Monitoring Suppl (ACCU-CHEK AVIVA PLUS) W/DEVICE KIT Use to check blood sugars twice a  day Dx 250.00 1 kit 0  . carvedilol (COREG) 12.5 MG tablet Take 1 tablet (12.5 mg total) by mouth 2 (two) times daily. 180 tablet 3  . glipiZIDE (GLUCOTROL) 10 MG tablet Take 1 tablet (10 mg total) by mouth 2 (two) times daily before a meal. 180 tablet 3  . glucose blood (ACCU-CHEK AVIVA PLUS) test strip Use to check blood sugars twice a day Dx E11.9 60 each 3  . insulin glargine (LANTUS) 100 UNIT/ML injection Inject 0.35 mLs (35 Units total) into the skin at bedtime. (Patient taking differently: Inject 15 Units into the skin 2 (two) times daily. ) 10 mL 11  . insulin lispro (HUMALOG KWIKPEN) 100 UNIT/ML KiwkPen Inject 10 units three times a day (Patient taking differently: 15 Units 2 (two) times daily. ) 15 mL 3  . Insulin Pen Needle (BD PEN NEEDLE NANO U/F) 32G X 4 MM MISC Use as directed 1 per day   250.02 100 each 11  . lidocaine-prilocaine (EMLA) cream Apply 1 application topically as needed. 30 g 3  . lisinopril (PRINIVIL,ZESTRIL) 10 MG tablet Take 1 tablet (10 mg total) by mouth daily. 30 tablet 11  . potassium chloride (K-DUR) 10 MEQ tablet Take 2 tablets (20 mEq total) by mouth daily. 180 tablet 1  . sitaGLIPtin (JANUVIA) 50 MG tablet Take 50 mg by mouth.    . tiotropium (SPIRIVA) 18 MCG inhalation capsule Place 18 mcg into inhaler and inhale daily as needed. Uses for shortness of breath.    Marland Kitchen  torsemide (DEMADEX) 20 MG tablet Take 20 mg by mouth daily.     No current facility-administered medications for this visit.   Facility-Administered Medications Ordered in Other Visits  Medication Dose Route Frequency Provider Last Rate Last Dose  . sodium chloride 0.9 % injection 10 mL  10 mL Intravenous PRN [name], MD   10 mL at 04/25/13 1100     Allergies:  Allergies  Allergen Reactions  . Other Hives    Poison ivy.     Past Medical History, Surgical history, Social history, and Family History were reviewed and updated.    Physical Exam: Blood pressure 156/58, pulse 77,  temperature 97.5 F (36.4 C), resp. rate 18, height _0  (1.905 m), weight 308 lb 12.8 oz (140.071 kg), SpO2 96 %. ECOG: 1 General appearance: alert awake gentleman appeared in some mild distress. Head: Normocephalic, without obvious abnormality Neck: no adenopathy Lymph nodes: Cervical, supraclavicular, and axillary nodes normal. Heart:regular rate and rhythm, S1, S2 normal, no murmur, click, rub or gallop Lung:chest clear, no wheezing, rales, normal symmetric air entry Abdomen: soft, non-tender, without masses or organomegaly EXT:no erythema, induration, or nodules. 1+ edema noted. No lymph nodes noted on exam today.   Lab Results: Lab Results  Component Value Date   WBC 7.4 08/19/2014   HGB 11.5* 08/19/2014   HCT 35.8* 08/19/2014   MCV 89.4 08/19/2014   PLT 209 08/19/2014     Chemistry      Component Value Date/Time   NA 139 08/19/2014 0817   NA 135 06/25/2014 0952   K 4.4 08/19/2014 0817   K 5.1 06/25/2014 0952   CL 104 06/25/2014 0952   CL 106 12/19/2012 0832   CO2 25 08/19/2014 0817   CO2 24 06/25/2014 0952   BUN 45.5* 08/19/2014 0817   BUN 55* 06/25/2014 0952   CREATININE 1.7* 08/19/2014 0817   CREATININE 1.9* 06/25/2014 0952      Component Value Date/Time   CALCIUM 8.8 08/19/2014 0817   CALCIUM 9.1 06/25/2014 0952   ALKPHOS 67 08/19/2014 0817   ALKPHOS 66 05/19/2014 0844   AST 13 08/19/2014 0817   AST 17 05/19/2014 0844   ALT 15 08/19/2014 0817   ALT 19 05/19/2014 0844   BILITOT 0.50 08/19/2014 0817   BILITOT 0.6 05/19/2014 0844        EXAM: CT CHEST, ABDOMEN AND PELVIS WITHOUT CONTRAST  TECHNIQUE: Multidetector CT imaging of the chest, abdomen and pelvis was performed following the standard protocol without IV contrast.  COMPARISON: 08/12/2014 chest radiograph. 10/02/2013 CTs.  FINDINGS: CT CHEST FINDINGS  Mediastinum/Nodes: Low left jugular node measures 1.4 cm on image 1 and is new or enlarged since the prior. A right internal  jugular line terminates at the low SVC. Increased number of bilateral subpectoral nodes, new. Aortic and branch vessel atherosclerosis. Mild cardiomegaly. Multivessel coronary artery atherosclerosis. Pulmonary artery enlargement, outflow tract 3.5 cm.  A low right paratracheal node measures 1.3 cm on image 23 versus 1.2 cm on the prior  Subcarinal node measures 1.4 cm today versus 1.1 cm on the prior (when remeasured). Hilar regions poorly evaluated without intravenous contrast.  Prevascular node measures 1.1 cm on image 22 versus 9 mm on the prior  Lungs/Pleura: Mild centrilobular emphysema. No pleural fluid.  Musculoskeletal: No acute osseous abnormality.  CT ABDOMEN PELVIS FINDINGS  Hepatobiliary: Degradation, secondary to patient size and arm position, not raised above the head. Lack of IV contrast also limits exam. Grossly normal liver. Normal gallbladder,  without biliary ductal dilatation.  Pancreas: Normal, without mass or ductal dilatation.  Spleen: Normal  Adrenals/Urinary Tract: Normal adrenal glands. Bilateral renal vascular calcifications. No bladder calculi.  Stomach/Bowel: Normal stomach, without wall thickening. Scattered colonic diverticula. Normal terminal ileum and appendix. Normal small bowel.  Vascular/Lymphatic: Aortic and branch vessel atherosclerosis. No abdominopelvic adenopathy.  Reproductive: Mild prostatomegaly.  Other: No significant free fluid.  Musculoskeletal: Degenerative partial fusion of the bilateral sacroiliac joints. Bilateral glenohumeral joint osteoarthritis.  IMPRESSION: CT CHEST IMPRESSION  1. Slight progression of thoracic adenopathy with suspicion of developing lower cervical adenopathy. 2. Atherosclerosis, including within the coronary arteries. 3. Pulmonary artery enlargement suggests pulmonary arterial hypertension.  CT ABDOMEN AND PELVIS IMPRESSION  1. No acute process or evidence of active  lymphoma within the abdomen or pelvis. 2. Decreased sensitivity and specificity exam due to technique related factors, as described above.   Impression and Plan:  This is a 69 year old gentleman with the following issues: 1. Chronic lymphocytic leukemia/small lymphocytic lymphoma. He is S/P B-R chemotherapy. He completed chemotherapy without complications. CT on 08/19/2014 was reviewed and for the most part show stable disease. He has some slight changes in his thoracic and cervical adenopathy but the overall size is very small and does not warrant any treatment at this time. The plan is to continue with observation and surveillance and repeat imaging studies every 6-12 months.  2. Abdominal pain: This have resolved at this time.   3. Shortness of breath:  Still an issue with exertion. A lot of it has to do with deconditioning and obesity. He follows up with cardiology regarding this issue.  4. Follow up: In 6 months after a scan. He will continue port flush every 2 months.       [facility] 2/4/20169:06 AM

## 2014-08-22 ENCOUNTER — Encounter: Payer: Self-pay | Admitting: Cardiovascular Disease

## 2014-08-22 ENCOUNTER — Ambulatory Visit (INDEPENDENT_AMBULATORY_CARE_PROVIDER_SITE_OTHER): Payer: Commercial Managed Care - HMO | Admitting: Cardiovascular Disease

## 2014-08-22 ENCOUNTER — Encounter: Payer: Self-pay | Admitting: Pulmonary Disease

## 2014-08-22 ENCOUNTER — Ambulatory Visit (INDEPENDENT_AMBULATORY_CARE_PROVIDER_SITE_OTHER): Payer: Commercial Managed Care - HMO | Admitting: Pulmonary Disease

## 2014-08-22 VITALS — BP 100/62 | HR 78 | Ht 75.0 in | Wt 307.0 lb

## 2014-08-22 VITALS — BP 142/76 | HR 74 | Temp 97.6°F | Ht 75.0 in | Wt 307.4 lb

## 2014-08-22 DIAGNOSIS — I10 Essential (primary) hypertension: Secondary | ICD-10-CM

## 2014-08-22 DIAGNOSIS — R0609 Other forms of dyspnea: Secondary | ICD-10-CM

## 2014-08-22 DIAGNOSIS — R0600 Dyspnea, unspecified: Secondary | ICD-10-CM

## 2014-08-22 DIAGNOSIS — I5043 Acute on chronic combined systolic (congestive) and diastolic (congestive) heart failure: Secondary | ICD-10-CM

## 2014-08-22 NOTE — Progress Notes (Signed)
   Subjective:    Patient ID: [name] [name], male    DOB: 1946-07-01, 69 y.o.   MRN: [id number]  HPI The patient comes in today for follow-up of his known dyspnea on exertion. He has mild emphysema by CT chest with some air trapping, but no significant airflow obstruction on PFTs. He is not taking Spiriva currently, but does use albuterol as needed. He also has diastolic dysfunction, and is followed by cardiology for intermittent fluid overload. Only, he is morbidly obese with deconditioning, and has gained over 20 pounds since his last visit here a year and a half ago.  He continues to have chronic dyspnea on exertion, but feels it is near his usual baseline.   Review of Systems  Constitutional: Negative for fever and unexpected weight change.  HENT: Negative for congestion, dental problem, ear pain, nosebleeds, postnasal drip, rhinorrhea, sinus pressure, sneezing, sore throat and trouble swallowing.   Eyes: Negative for redness and itching.  Respiratory: Positive for shortness of breath and wheezing. Negative for cough and chest tightness.   Cardiovascular: Positive for leg swelling. Negative for palpitations.  Gastrointestinal: Negative for nausea and vomiting.  Genitourinary: Negative for dysuria.  Musculoskeletal: Negative for joint swelling.  Skin: Negative for rash.  Neurological: Negative for headaches.  Hematological: Does not bruise/bleed easily.  Psychiatric/Behavioral: Negative for dysphoric mood. The patient is not nervous/anxious.        Objective:   Physical Exam Morbidly obese male in no acute distress Nose without purulence or discharge noted Neck without lymphadenopathy or thyromegaly Chest totally clear to auscultation, no wheezing Cardiac exam with regular rate and rhythm, 2/6 systolic murmur Lower extremities with edema noted, no cyanosis Alert and oriented, moves all 4 extremities.       Assessment & Plan:

## 2014-08-22 NOTE — Patient Instructions (Signed)
Your physician recommends that you continue on your current medications as directed. Please refer to the Current Medication list given to you today.  Your physician wants you to follow-up in: 6 months with Dr. Nahser.  You will receive a reminder letter in the mail two months in advance. If you don't receive a letter, please call our office to schedule the follow-up appointment.  

## 2014-08-22 NOTE — Patient Instructions (Signed)
Ok to stay off spiriva.  You can use albuterol as needed Work aggressively on weight loss and conditioning. followup with me as needed.

## 2014-08-22 NOTE — Assessment & Plan Note (Signed)
The patient has multifactorial dyspnea on exertion that is primarily secondary to his morbid obesity and deconditioning. He does have some diastolic dysfunction, as well as emphysema without significant airflow obstruction on his PFTs. From a pulmonary standpoint, he can continue his albuterol as needed, but I have told him his main treatment should focus on weight loss and conditioning. I also recommended that he weigh daily, in order to control his fluid balance. I will see him back on an as-needed basis.

## 2014-08-22 NOTE — Progress Notes (Signed)
Cardiology Office Note   Date:  08/22/2014   ID:  Amontae Ng, DOB [birth year], MRN [id number]  PCP:  Cathlean Cower, MD  Cardiologist:   Acie Fredrickson Wonda Cheng, MD   Chief Complaint  Patient presents with  . Follow-up    CHF   Problem List: 1. Coronary artery disease 2. Ischemic cardiopathy with an ejection fraction of around 35% 3. Aortic stenosis - moderate 4. Non hodgekins lymphoma 5. Type 2 diabetes mellitus 6. Sleep apnea   History of Present Illness: [name] [name] is a 69 y.o. male who presents for follow up of CHF.   Breathing is ok.  Has gained weight.   He has been eating too many donuts.  A new shop opened up near his house. Marland Kitchen  He is not having any worsening dyspnea.  No cough. Has gained weight - about 40 lbs in the past 6 months     Past Medical History  Diagnosis Date  . Ischemic cardiomyopathy   . Type II diabetes mellitus   . Hypertension   . Hypercholesterolemia   . Lower GI bleeding 1980's    "when I was drinking alot" (06/06/2012)  . Arthritis     "both shoulders" (06/06/2012)  . Chronic combined systolic and diastolic CHF (congestive heart failure) 07/10/2012    a. EF 27-03%, + diastolic dysfunction by echo 12/2012.  Marland Kitchen GERD (gastroesophageal reflux disease) 07/12/2012  . OSA on CPAP     uses a cpap  . Myocardial infarction 11/13  . Coronary artery disease     a. Cath 05/2012 with CTO of RCA; s/p BMS to severe OM-1 stenosis.  Marland Kitchen History of alcoholism   . Sleep apnea   . Moderate aortic stenosis     a. Mod by echo 12/2012.  Marland Kitchen Pancreatitis   . Diverticulosis   . Personal history of rectal adenoma 05/28/2013  . Dietary noncompliance   . Morbid obesity   . Lymphoma     a. 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  . Shortness of breath     Past Surgical History  Procedure Laterality Date  . Coronary angioplasty with stent placement  06/06/2012    "1"  . Lymph  gland excision  07/13/2012    Procedure: CERVICAL LYMPH GLAND EXCISION;  Surgeon: Haywood Lasso, MD;  Location: [facility];  Service: General;  Laterality: Right;  . Portacath placement  07/30/2012    Procedure: INSERTION PORT-A-CATH;  Surgeon: Haywood Lasso, MD;  Location: [facility];  Service: General;  Laterality: Right;  . Colonoscopy N/A 05/28/2013    Procedure: COLONOSCOPY;  Surgeon: Gatha Mayer, MD;  Location: [facility];  Service: Endoscopy;  Laterality: N/A;  . Left and right heart catheterization with coronary angiogram N/A 06/06/2012    Procedure: LEFT AND RIGHT HEART CATHETERIZATION WITH CORONARY ANGIOGRAM;  Surgeon: Pixie Casino, MD;  Location: [facility] C[facility] LAB;  Service: Cardiovascular;  Laterality: N/A;  . Percutaneous coronary stent intervention (pci-s)  06/06/2012    Procedure: PERCUTANEOUS CORONARY STENT INTERVENTION (PCI-S);  Surgeon: Troy Sine, MD;  Location: [facility] CATH LAB;  Service: Cardiovascular;;     Current Outpatient Prescriptions  Medication Sig Dispense Refill  . albuterol (PROVENTIL HFA;VENTOLIN HFA) 108 (90 BASE) MCG/ACT inhaler Inhale 2 puffs into the lungs every 6 (six) hours as needed for wheezing or shortness of breath. 1 Inhaler 11  . AMBULATORY NON FORMULARY MEDICATION Home O2 @@ 2 LMP for 8  hrs at night    . AMBULATORY NON FORMULARY MEDICATION CPAP    . aspirin EC 81 MG tablet Take 81 mg by mouth daily.    Marland Kitchen atorvastatin (LIPITOR) 20 MG tablet Take 1 tablet (20 mg total) by mouth daily at 6 PM. 90 tablet 3  . Blood Glucose Monitoring Suppl (ACCU-CHEK AVIVA PLUS) W/DEVICE KIT Use to check blood sugars twice a day Dx 250.00 1 kit 0  . carvedilol (COREG) 12.5 MG tablet Take 1 tablet (12.5 mg total) by mouth 2 (two) times daily. 180 tablet 3  . glipiZIDE (GLUCOTROL) 10 MG tablet Take 1 tablet (10 mg total) by mouth 2 (two) times daily before a meal. 180 tablet 3  . glucose blood (ACCU-CHEK AVIVA PLUS) test strip Use to check blood  sugars twice a day Dx E11.9 60 each 3  . insulin glargine (LANTUS) 100 UNIT/ML injection Inject 0.35 mLs (35 Units total) into the skin at bedtime. (Patient taking differently: Inject 15 Units into the skin 2 (two) times daily. ) 10 mL 11  . insulin lispro (HUMALOG KWIKPEN) 100 UNIT/ML KiwkPen Inject 10 units three times a day (Patient taking differently: 15 Units 2 (two) times daily. ) 15 mL 3  . Insulin Pen Needle (BD PEN NEEDLE NANO U/F) 32G X 4 MM MISC Use as directed 1 per day   250.02 100 each 11  . lidocaine-prilocaine (EMLA) cream Apply 1 application topically as needed. 30 g 3  . lisinopril (PRINIVIL,ZESTRIL) 10 MG tablet Take 1 tablet (10 mg total) by mouth daily. 30 tablet 11  . potassium chloride (K-DUR) 10 MEQ tablet Take 2 tablets (20 mEq total) by mouth daily. 180 tablet 1  . sitaGLIPtin (JANUVIA) 50 MG tablet Take 50 mg by mouth daily.     Marland Kitchen tiotropium (SPIRIVA) 18 MCG inhalation capsule Place 18 mcg into inhaler and inhale daily as needed. Uses for shortness of breath.    . torsemide (DEMADEX) 20 MG tablet Take 20 mg by mouth daily.     No current facility-administered medications for this visit.   Facility-Administered Medications Ordered in Other Visits  Medication Dose Route Frequency Provider Last Rate Last Dose  . sodium chloride 0.9 % injection 10 mL  10 mL Intravenous PRN [name], MD   10 mL at 04/25/13 1100    Allergies:   Other    Social History:  The patient  reports that he quit smoking about 13 months ago. His smoking use included Cigarettes. He has a 53 pack-year smoking history. He has never used smokeless tobacco. He reports that he does not drink alcohol or use illicit drugs.   Family History:  The patient's family history includes Colon cancer in his daughter; Diabetes in his father; Heart disease in his father; Osteoarthritis in his father and mother.    ROS:  Please see the history of present illness.    Review of Systems: Constitutional:   denies fever, chills, diaphoresis, appetite change and fatigue.  HEENT: denies photophobia, eye pain, redness, hearing loss, ear pain, congestion, sore throat, rhinorrhea, sneezing, neck pain, neck stiffness and tinnitus.  Respiratory: denies SOB, DOE, cough, chest tightness, and wheezing.  Cardiovascular: denies chest pain, palpitations and leg swelling.  Gastrointestinal: denies nausea, vomiting, abdominal pain, diarrhea, constipation, blood in stool.  Genitourinary: denies dysuria, urgency, frequency, hematuria, flank pain and difficulty urinating.  Musculoskeletal: denies  myalgias, back pain, joint swelling, arthralgias and gait problem.   Skin: denies pallor, rash and wound.  Neurological:  denies dizziness, seizures, syncope, weakness, light-headedness, numbness and headaches.   Hematological: denies adenopathy, easy bruising, personal or family bleeding history.  Psychiatric/ Behavioral: denies suicidal ideation, mood changes, confusion, nervousness, sleep disturbance and agitation.       All other systems are reviewed and negative.    PHYSICAL EXAM: VS:  BP 100/62 mmHg  Pulse 78  Ht 6' 3" (1.905 m)  Wt 307 lb (139.254 kg)  BMI 38.37 kg/m2 , BMI Body mass index is 38.37 kg/(m^2). GEN: Well nourished, well developed, in no acute distress HEENT: normal Neck: no JVD, carotid bruits, or masses Cardiac: RRR; soft systolic  murmur, rubs, or gallops,no edema  Respiratory:  clear to auscultation bilaterally, normal work of breathing GI: soft, nontender, nondistended, + BS,  Moderately  obese  MS: no deformity or atrophy Skin: warm and dry, no rash Neuro:  Strength and sensation are intact Psych: normal   EKG:  EKG is not ordered today. The ekg ordered today demonstrates   Recent Labs: 02/04/2014: TSH 0.76 06/25/2014: Pro B Natriuretic peptide (BNP) 102.0* 08/19/2014: ALT 15; BUN 45.5*; Creatinine 1.7*; Hemoglobin 11.5*; Platelets 209; Potassium 4.4; Sodium 139    Lipid  Panel    Component Value Date/Time   CHOL 141 05/19/2014 0844   TRIG 149.0 05/19/2014 0844   HDL 31.50* 05/19/2014 0844   CHOLHDL 4 05/19/2014 0844   VLDL 29.8 05/19/2014 0844   LDLCALC 80 05/19/2014 0844      Wt Readings from Last 3 Encounters:  08/22/14 307 lb (139.254 kg)  08/21/14 308 lb 12.8 oz (140.071 kg)  08/12/14 305 lb (138.347 kg)      Other studies Reviewed: Additional studies/ records that were reviewed today include: . Review of the above records demonstrates:    ASSESSMENT AND PLAN:  1. Coronary artery disease - no recent angina. 2. Ischemic cardiopathy with an ejection fraction of around 35% - he seems to be doing well. He's having some shortness breath but I suspect this is due to weight gain. Encouraged him to lose weight. 3. Aortic stenosis - moderate 4. Non hodgekins lymphoma 5. Type 2 diabetes mellitus 6. Sleep apnea   Current medicines are reviewed at length with the patient today.  The patient does not have concerns regarding medicines.  The following changes have been made:  no change   Disposition:   FU with me in 6 months.     Signed, Nahser, Wonda Cheng, MD  08/22/2014 9:07 AM    [facility] [place], [place], [place]  [address] Phone: [phone]; Fax: [phone]

## 2014-08-27 ENCOUNTER — Other Ambulatory Visit (INDEPENDENT_AMBULATORY_CARE_PROVIDER_SITE_OTHER): Payer: Commercial Managed Care - HMO

## 2014-08-27 DIAGNOSIS — E1165 Type 2 diabetes mellitus with hyperglycemia: Secondary | ICD-10-CM

## 2014-08-27 DIAGNOSIS — IMO0002 Reserved for concepts with insufficient information to code with codable children: Secondary | ICD-10-CM

## 2014-08-27 LAB — COMPREHENSIVE METABOLIC PANEL
ALBUMIN: 4.2 g/dL (ref 3.5–5.2)
ALK PHOS: 65 U/L (ref 39–117)
ALT: 14 U/L (ref 0–53)
AST: 13 U/L (ref 0–37)
BUN: 44 mg/dL — ABNORMAL HIGH (ref 6–23)
CO2: 29 mEq/L (ref 19–32)
Calcium: 9.6 mg/dL (ref 8.4–10.5)
Chloride: 100 mEq/L (ref 96–112)
Creatinine, Ser: 1.77 mg/dL — ABNORMAL HIGH (ref 0.40–1.50)
GFR: 49.46 mL/min — ABNORMAL LOW (ref >60.00)
Glucose, Bld: 170 mg/dL — ABNORMAL HIGH (ref 70–99)
POTASSIUM: 4.6 meq/L (ref 3.5–5.1)
SODIUM: 134 meq/L — AB (ref 135–145)
Total Bilirubin: 0.4 mg/dL (ref 0.2–1.2)
Total Protein: 7.2 g/dL (ref 6.0–8.3)

## 2014-08-27 LAB — HEMOGLOBIN A1C: Hgb A1c MFr Bld: 8.7 % — ABNORMAL HIGH (ref 4.6–6.5)

## 2014-09-01 ENCOUNTER — Encounter: Payer: Commercial Managed Care - HMO | Admitting: Nutrition

## 2014-09-01 ENCOUNTER — Ambulatory Visit: Payer: Commercial Managed Care - HMO | Admitting: Endocrinology

## 2014-09-03 ENCOUNTER — Other Ambulatory Visit: Payer: Self-pay | Admitting: Cardiology

## 2014-09-03 ENCOUNTER — Telehealth: Payer: Self-pay | Admitting: Cardiovascular Disease

## 2014-09-03 DIAGNOSIS — I10 Essential (primary) hypertension: Secondary | ICD-10-CM

## 2014-09-03 MED ORDER — TORSEMIDE 20 MG PO TABS: 20.0000 mg | ORAL_TABLET | Freq: Every day | ORAL | Status: DC

## 2014-09-03 NOTE — Telephone Encounter (Signed)
New Msg        Pt calling, states he has gained about 4 lbs in one week and is concerned about fluid retention.  Pt took an additional fluid pill for the past 3 days.  Please return pt call.

## 2014-09-03 NOTE — Telephone Encounter (Signed)
Spoke with patient and reviewed Dr. Elmarie Shiley advice with him and advised patient to return to taking Torsemide once daily.  Patient scheduled for follow-up Friday 2/26 per Dr. Acie Fredrickson request and will check bmet at that time.  I advised patient to call back if he continues to have weight gain, develops SOB, swelling, or other concerns.  Patient verbalized understanding and agreement.

## 2014-09-03 NOTE — Telephone Encounter (Signed)
Spoke with patient who states he increased dosage of Torsemide to twice daily starting Saturday - states weight started increasing approximately 2 weeks ago; states he attributed it to eating too many doughnuts Patient states he stopped eating doughnuts as advised by Dr. Acie Fredrickson on 2/5 but states weight has continued to increase Weight 308 lb today; previous weight was 303 lb per patient on home scale Patient denies SOB; states he has eaten some bacon today - 2 pieces, otherwise denies increased salt intake Patient denies extremity of abdominal swelling; patient states he is concerned about weight gain, however he does not feel poor I advised patient that I will discuss with Dr. Acie Fredrickson who is in the office today and call him back with his advice; patient verbalized understanding and agreement

## 2014-09-03 NOTE — Telephone Encounter (Signed)
He needs to pay closer attention to his diet. Needs to watch his salt. Continue meds.

## 2014-09-12 ENCOUNTER — Ambulatory Visit (INDEPENDENT_AMBULATORY_CARE_PROVIDER_SITE_OTHER): Payer: Commercial Managed Care - HMO | Admitting: Cardiovascular Disease

## 2014-09-12 ENCOUNTER — Encounter (HOSPITAL_COMMUNITY): Payer: Self-pay | Admitting: General Practice

## 2014-09-12 ENCOUNTER — Other Ambulatory Visit (INDEPENDENT_AMBULATORY_CARE_PROVIDER_SITE_OTHER): Payer: Commercial Managed Care - HMO | Admitting: *Deleted

## 2014-09-12 ENCOUNTER — Encounter: Payer: Self-pay | Admitting: Cardiovascular Disease

## 2014-09-12 ENCOUNTER — Inpatient Hospital Stay (HOSPITAL_COMMUNITY)
Admission: AD | Admit: 2014-09-12 | Discharge: 2014-09-16 | DRG: 292 | Disposition: A | Discharge status: Home / Self Care | Payer: Commercial Managed Care - HMO | Source: Ambulatory Visit | Attending: Cardiovascular Disease | Admitting: Cardiovascular Disease

## 2014-09-12 VITALS — BP 128/78 | HR 82 | Ht 75.0 in | Wt 315.8 lb

## 2014-09-12 DIAGNOSIS — I509 Heart failure, unspecified: Secondary | ICD-10-CM

## 2014-09-12 DIAGNOSIS — Z794 Long term (current) use of insulin: Secondary | ICD-10-CM | POA: Diagnosis not present

## 2014-09-12 DIAGNOSIS — R0600 Dyspnea, unspecified: Secondary | ICD-10-CM

## 2014-09-12 DIAGNOSIS — I350 Nonrheumatic aortic (valve) stenosis: Secondary | ICD-10-CM | POA: Diagnosis present

## 2014-09-12 DIAGNOSIS — Z87891 Personal history of nicotine dependence: Secondary | ICD-10-CM | POA: Diagnosis not present

## 2014-09-12 DIAGNOSIS — Z9119 Patient's noncompliance with other medical treatment and regimen: Secondary | ICD-10-CM | POA: Diagnosis present

## 2014-09-12 DIAGNOSIS — G4733 Obstructive sleep apnea (adult) (pediatric): Secondary | ICD-10-CM | POA: Diagnosis present

## 2014-09-12 DIAGNOSIS — I10 Essential (primary) hypertension: Secondary | ICD-10-CM | POA: Diagnosis present

## 2014-09-12 DIAGNOSIS — N183 Chronic kidney disease, stage 3 (moderate): Secondary | ICD-10-CM

## 2014-09-12 DIAGNOSIS — E780 Pure hypercholesterolemia: Secondary | ICD-10-CM | POA: Diagnosis present

## 2014-09-12 DIAGNOSIS — E119 Type 2 diabetes mellitus without complications: Secondary | ICD-10-CM | POA: Diagnosis present

## 2014-09-12 DIAGNOSIS — I5043 Acute on chronic combined systolic (congestive) and diastolic (congestive) heart failure: Principal | ICD-10-CM

## 2014-09-12 DIAGNOSIS — I252 Old myocardial infarction: Secondary | ICD-10-CM

## 2014-09-12 DIAGNOSIS — I255 Ischemic cardiomyopathy: Secondary | ICD-10-CM | POA: Diagnosis present

## 2014-09-12 DIAGNOSIS — C8590 Non-Hodgkin lymphoma, unspecified, unspecified site: Secondary | ICD-10-CM | POA: Diagnosis present

## 2014-09-12 DIAGNOSIS — E785 Hyperlipidemia, unspecified: Secondary | ICD-10-CM | POA: Diagnosis present

## 2014-09-12 DIAGNOSIS — Z6839 Body mass index (BMI) 39.0-39.9, adult: Secondary | ICD-10-CM | POA: Diagnosis not present

## 2014-09-12 DIAGNOSIS — I110 Hypertensive heart disease with heart failure: Secondary | ICD-10-CM | POA: Insufficient documentation

## 2014-09-12 DIAGNOSIS — I2510 Atherosclerotic heart disease of native coronary artery without angina pectoris: Secondary | ICD-10-CM | POA: Diagnosis present

## 2014-09-12 DIAGNOSIS — K219 Gastro-esophageal reflux disease without esophagitis: Secondary | ICD-10-CM | POA: Diagnosis present

## 2014-09-12 DIAGNOSIS — Z955 Presence of coronary angioplasty implant and graft: Secondary | ICD-10-CM

## 2014-09-12 DIAGNOSIS — I25810 Atherosclerosis of coronary artery bypass graft(s) without angina pectoris: Secondary | ICD-10-CM | POA: Insufficient documentation

## 2014-09-12 DIAGNOSIS — R0602 Shortness of breath: Secondary | ICD-10-CM | POA: Diagnosis present

## 2014-09-12 HISTORY — DX: Personal history of peptic ulcer disease: Z87.11

## 2014-09-12 HISTORY — DX: Dependence on supplemental oxygen: Z99.81

## 2014-09-12 HISTORY — DX: Personal history of other diseases of the digestive system: Z87.19

## 2014-09-12 LAB — TROPONIN I
TROPONIN I: 0.03 ng/mL (ref <0.031)
Troponin I: <0.03 ng/mL (ref <0.031)

## 2014-09-12 LAB — COMPREHENSIVE METABOLIC PANEL
ALT: 15 U/L (ref 0–53)
ANION GAP: 6 (ref 5–15)
AST: 17 U/L (ref 0–37)
Albumin: 3.9 g/dL (ref 3.5–5.2)
Alkaline Phosphatase: 68 U/L (ref 39–117)
BUN: 32 mg/dL — AB (ref 6–23)
CALCIUM: 9.2 mg/dL (ref 8.4–10.5)
CO2: 28 mmol/L (ref 19–32)
Chloride: 102 mmol/L (ref 96–112)
Creatinine, Ser: 1.76 mg/dL — ABNORMAL HIGH (ref 0.50–1.35)
GFR calc Af Amer: 44 mL/min — ABNORMAL LOW (ref >90)
GFR, EST NON AFRICAN AMERICAN: 38 mL/min — AB (ref >90)
Glucose, Bld: 195 mg/dL — ABNORMAL HIGH (ref 70–99)
Potassium: 5 mmol/L (ref 3.5–5.1)
SODIUM: 136 mmol/L (ref 135–145)
Total Bilirubin: 0.5 mg/dL (ref 0.3–1.2)
Total Protein: 7.2 g/dL (ref 6.0–8.3)

## 2014-09-12 LAB — BASIC METABOLIC PANEL
BUN: 37 mg/dL — AB (ref 6–23)
CALCIUM: 9.3 mg/dL (ref 8.4–10.5)
CO2: 29 mEq/L (ref 19–32)
Chloride: 100 mEq/L (ref 96–112)
Creatinine, Ser: 1.69 mg/dL — ABNORMAL HIGH (ref 0.40–1.50)
GFR: 52.17 mL/min — ABNORMAL LOW (ref >60.00)
GLUCOSE: 151 mg/dL — AB (ref 70–99)
Potassium: 4.5 mEq/L (ref 3.5–5.1)
Sodium: 135 mEq/L (ref 135–145)

## 2014-09-12 LAB — CBC WITH DIFFERENTIAL/PLATELET
Basophils Absolute: 0 10*3/uL (ref 0.0–0.1)
Basophils Relative: 0 % (ref 0–1)
EOS ABS: 0.3 10*3/uL (ref 0.0–0.7)
EOS PCT: 4 % (ref 0–5)
HCT: 32.4 % — ABNORMAL LOW (ref 39.0–52.0)
Hemoglobin: 10.6 g/dL — ABNORMAL LOW (ref 13.0–17.0)
LYMPHS ABS: 1.7 10*3/uL (ref 0.7–4.0)
Lymphocytes Relative: 22 % (ref 12–46)
MCH: 29.8 pg (ref 26.0–34.0)
MCHC: 32.7 g/dL (ref 30.0–36.0)
MCV: 91 fL (ref 78.0–100.0)
Monocytes Absolute: 0.5 10*3/uL (ref 0.1–1.0)
Monocytes Relative: 6 % (ref 3–12)
Neutro Abs: 5.5 10*3/uL (ref 1.7–7.7)
Neutrophils Relative %: 68 % (ref 43–77)
Platelets: 196 10*3/uL (ref 150–400)
RBC: 3.56 MIL/uL — ABNORMAL LOW (ref 4.22–5.81)
RDW: 14 % (ref 11.5–15.5)
WBC: 8 10*3/uL (ref 4.0–10.5)

## 2014-09-12 LAB — GLUCOSE, CAPILLARY
Glucose-Capillary: 196 mg/dL — ABNORMAL HIGH (ref 70–99)
Glucose-Capillary: 139 mg/dL — ABNORMAL HIGH (ref 70–99)

## 2014-09-12 LAB — BRAIN NATRIURETIC PEPTIDE: B Natriuretic Peptide: 248.9 pg/mL — ABNORMAL HIGH (ref 0.0–100.0)

## 2014-09-12 LAB — TSH: TSH: 0.487 u[IU]/mL (ref 0.350–4.500)

## 2014-09-12 LAB — MAGNESIUM: MAGNESIUM: 2.3 mg/dL (ref 1.5–2.5)

## 2014-09-12 MED ORDER — INSULIN ASPART 100 UNIT/ML ~~LOC~~ SOLN
10.0000 [IU] | Freq: Three times a day (TID) | SUBCUTANEOUS | Status: DC
  Administered 2014-09-12 – 2014-09-16 (×11): 10 [IU] via SUBCUTANEOUS

## 2014-09-12 MED ORDER — LINAGLIPTIN 5 MG PO TABS
5.0000 mg | ORAL_TABLET | Freq: Every day | ORAL | Status: DC
  Administered 2014-09-13 – 2014-09-16 (×4): 5 mg via ORAL
  Filled 2014-09-12 (×5): qty 1

## 2014-09-12 MED ORDER — TIOTROPIUM BROMIDE MONOHYDRATE 18 MCG IN CAPS
18.0000 ug | ORAL_CAPSULE | Freq: Every day | RESPIRATORY_TRACT | Status: DC
  Filled 2014-09-12: qty 5

## 2014-09-12 MED ORDER — ATORVASTATIN CALCIUM 20 MG PO TABS
20.0000 mg | ORAL_TABLET | Freq: Every day | ORAL | Status: DC
  Administered 2014-09-12 – 2014-09-15 (×4): 20 mg via ORAL
  Filled 2014-09-12 (×5): qty 1

## 2014-09-12 MED ORDER — LISINOPRIL 10 MG PO TABS
10.0000 mg | ORAL_TABLET | Freq: Every day | ORAL | Status: DC
Start: 2014-09-12 — End: 2014-09-16
  Administered 2014-09-13 – 2014-09-16 (×4): 10 mg via ORAL
  Filled 2014-09-12 (×5): qty 1

## 2014-09-12 MED ORDER — SODIUM CHLORIDE 0.9 % IV SOLN: 250.0000 mL | INTRAVENOUS | Status: DC | PRN

## 2014-09-12 MED ORDER — CARVEDILOL 12.5 MG PO TABS
12.5000 mg | ORAL_TABLET | Freq: Two times a day (BID) | ORAL | Status: DC
Start: 2014-09-12 — End: 2014-09-16
  Administered 2014-09-12 – 2014-09-16 (×8): 12.5 mg via ORAL
  Filled 2014-09-12 (×10): qty 1

## 2014-09-12 MED ORDER — FUROSEMIDE 10 MG/ML IJ SOLN
80.0000 mg | Freq: Two times a day (BID) | INTRAMUSCULAR | Status: DC
Start: 2014-09-12 — End: 2014-09-15
  Administered 2014-09-12 – 2014-09-15 (×6): 80 mg via INTRAVENOUS
  Filled 2014-09-12 (×9): qty 8

## 2014-09-12 MED ORDER — SODIUM CHLORIDE 0.9 % IJ SOLN
3.0000 mL | Freq: Two times a day (BID) | INTRAMUSCULAR | Status: DC
  Administered 2014-09-12 – 2014-09-16 (×9): 3 mL via INTRAVENOUS

## 2014-09-12 MED ORDER — INSULIN GLARGINE 100 UNIT/ML ~~LOC~~ SOLN
35.0000 [IU] | Freq: Every day | SUBCUTANEOUS | Status: DC
  Administered 2014-09-12 – 2014-09-15 (×4): 35 [IU] via SUBCUTANEOUS
  Filled 2014-09-12 (×5): qty 0.35

## 2014-09-12 MED ORDER — GLIPIZIDE 10 MG PO TABS
10.0000 mg | ORAL_TABLET | Freq: Two times a day (BID) | ORAL | Status: DC
  Administered 2014-09-12 – 2014-09-14 (×4): 10 mg via ORAL
  Filled 2014-09-12 (×6): qty 1

## 2014-09-12 MED ORDER — ONDANSETRON HCL 4 MG/2ML IJ SOLN: 4.0000 mg | Freq: Four times a day (QID) | INTRAMUSCULAR | Status: DC | PRN

## 2014-09-12 MED ORDER — SODIUM CHLORIDE 0.9 % IJ SOLN: 3.0000 mL | INTRAMUSCULAR | Status: DC | PRN

## 2014-09-12 MED ORDER — ACETAMINOPHEN 325 MG PO TABS: 650.0000 mg | ORAL_TABLET | ORAL | Status: DC | PRN

## 2014-09-12 MED ORDER — POTASSIUM CHLORIDE CRYS ER 20 MEQ PO TBCR
20.0000 meq | EXTENDED_RELEASE_TABLET | Freq: Two times a day (BID) | ORAL | Status: DC
  Administered 2014-09-12 – 2014-09-16 (×8): 20 meq via ORAL
  Filled 2014-09-12 (×10): qty 1

## 2014-09-12 MED ORDER — ASPIRIN EC 81 MG PO TBEC
81.0000 mg | DELAYED_RELEASE_TABLET | Freq: Every day | ORAL | Status: DC
  Administered 2014-09-13 – 2014-09-16 (×4): 81 mg via ORAL
  Filled 2014-09-12 (×5): qty 1

## 2014-09-12 MED ORDER — ENOXAPARIN SODIUM 40 MG/0.4ML ~~LOC~~ SOLN
40.0000 mg | SUBCUTANEOUS | Status: DC
  Administered 2014-09-12 – 2014-09-15 (×4): 40 mg via SUBCUTANEOUS
  Filled 2014-09-12 (×5): qty 0.4

## 2014-09-12 NOTE — Addendum Note (Signed)
Addended by: Eulis Foster on: 09/12/2014 08:59 AM   Modules accepted: Orders

## 2014-09-12 NOTE — Patient Instructions (Signed)
Your physician recommends that you go directly to [facility] for admission for Acute on Chronic Combined Congestive Heart Failure

## 2014-09-12 NOTE — Progress Notes (Signed)
Cardiology Office Note   Date:  09/12/2014   ID:  [name] [name], DOB Jan 16, 1946, MRN [id number]  PCP:  Cathlean Cower, MD  Cardiologist:   Acie Fredrickson Wonda Cheng, MD   Chief Complaint  Patient presents with  . Follow-up    CHF   Problem List: 1. Coronary artery disease 2. Ischemic cardiopathy with an ejection fraction of around 35% 3. Aortic stenosis - moderate 4. Non hodgekins lymphoma 5. Type 2 diabetes mellitus 6. Sleep apnea   History of Present Illness: [name] [name] is a 69 y.o. male who presents for follow up of CHF.   Breathing is ok.  Has gained weight.   He has been eating too many donuts.  A new shop opened up near his house. Marland Kitchen  He is not having any worsening dyspnea.  No cough. Has gained weight - about 40 lbs in the past 6 months   Feb. 26, 2016:  [name] [name] presents today - very short of breath.  Has gained 8 lbs since I last seen him several weeks ago.  No CP ,  Severe lack of engegy  Has been feeling this poorly for the past 2 weeks. He has continued to gain weight.   Past Medical History  Diagnosis Date  . Ischemic cardiomyopathy   . Type II diabetes mellitus   . Hypertension   . Hypercholesterolemia   . Lower GI bleeding 1980's    "when I was drinking alot" (06/06/2012)  . Arthritis     "both shoulders" (06/06/2012)  . Chronic combined systolic and diastolic CHF (congestive heart failure) 07/10/2012    a. EF 76-72%, + diastolic dysfunction by echo 12/2012.  Marland Kitchen GERD (gastroesophageal reflux disease) 07/12/2012  . OSA on CPAP     uses a cpap  . Myocardial infarction 11/13  . Coronary artery disease     a. Cath 05/2012 with CTO of RCA; s/p BMS to severe OM-1 stenosis.  Marland Kitchen History of alcoholism   . Sleep apnea   . Moderate aortic stenosis     a. Mod by echo 12/2012.  Marland Kitchen Pancreatitis   . Diverticulosis   . Personal history of rectal adenoma 05/28/2013  . Dietary noncompliance   . Morbid obesity   . Lymphoma     a. Chronic lymphocytic  leukemia/small lymphocytic lymphoma. 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  . Shortness of breath     Past Surgical History  Procedure Laterality Date  . Coronary angioplasty with stent placement  06/06/2012    "1"  . Lymph gland excision  07/13/2012    Procedure: CERVICAL LYMPH GLAND EXCISION;  Surgeon: Haywood Lasso, MD;  Location: [facility];  Service: General;  Laterality: Right;  . Portacath placement  07/30/2012    Procedure: INSERTION PORT-A-CATH;  Surgeon: Haywood Lasso, MD;  Location: [facility];  Service: General;  Laterality: Right;  . Colonoscopy N/A 05/28/2013    Procedure: COLONOSCOPY;  Surgeon: Gatha Mayer, MD;  Location: [facility];  Service: Endoscopy;  Laterality: N/A;  . Left and right heart catheterization with coronary angiogram N/A 06/06/2012    Procedure: LEFT AND RIGHT HEART CATHETERIZATION WITH CORONARY ANGIOGRAM;  Surgeon: Pixie Casino, MD;  Location: [facility] CATH LAB;  Service: Cardiovascular;  Laterality: N/A;  . Percutaneous coronary stent intervention (pci-s)  06/06/2012    Procedure: PERCUTANEOUS CORONARY STENT INTERVENTION (PCI-S);  Surgeon: Troy Sine, MD;  Location: [facility] CATH LAB;  Service: Cardiovascular;;     Current Outpatient  Prescriptions  Medication Sig Dispense Refill  . albuterol (PROVENTIL HFA;VENTOLIN HFA) 108 (90 BASE) MCG/ACT inhaler Inhale 2 puffs into the lungs every 6 (six) hours as needed for wheezing or shortness of breath. 1 Inhaler 11  . AMBULATORY NON FORMULARY MEDICATION Home O2 @@ 2 LMP for 8 hrs at night    . AMBULATORY NON FORMULARY MEDICATION CPAP    . aspirin EC 81 MG tablet Take 81 mg by mouth daily.    Marland Kitchen atorvastatin (LIPITOR) 20 MG tablet Take 1 tablet (20 mg total) by mouth daily at 6 PM. 90 tablet 3  . Blood Glucose Monitoring Suppl (ACCU-CHEK AVIVA PLUS) W/DEVICE KIT Use to check blood sugars twice a day Dx 250.00 1 kit 0  . carvedilol (COREG) 12.5 MG tablet Take 1 tablet  (12.5 mg total) by mouth 2 (two) times daily. 180 tablet 3  . glipiZIDE (GLUCOTROL) 10 MG tablet Take 1 tablet (10 mg total) by mouth 2 (two) times daily before a meal. 180 tablet 3  . glucose blood (ACCU-CHEK AVIVA PLUS) test strip Use to check blood sugars twice a day Dx E11.9 60 each 3  . insulin glargine (LANTUS) 100 UNIT/ML injection Inject 0.35 mLs (35 Units total) into the skin at bedtime. (Patient taking differently: Inject 15 Units into the skin 2 (two) times daily. ) 10 mL 11  . insulin lispro (HUMALOG KWIKPEN) 100 UNIT/ML KiwkPen Inject 10 units three times a day (Patient taking differently: 15 Units 2 (two) times daily. ) 15 mL 3  . Insulin Pen Needle (BD PEN NEEDLE NANO U/F) 32G X 4 MM MISC Use as directed 1 per day   250.02 100 each 11  . lidocaine-prilocaine (EMLA) cream Apply 1 application topically as needed. 30 g 3  . lisinopril (PRINIVIL,ZESTRIL) 10 MG tablet Take 1 tablet (10 mg total) by mouth daily. 30 tablet 11  . potassium chloride (K-DUR) 10 MEQ tablet TAKE TWO TABLETS BY MOUTH ONCE DAILY 180 tablet 2  . sitaGLIPtin (JANUVIA) 50 MG tablet Take 50 mg by mouth daily.     Marland Kitchen tiotropium (SPIRIVA) 18 MCG inhalation capsule Place 18 mcg into inhaler and inhale daily as needed. Uses for shortness of breath.    . torsemide (DEMADEX) 20 MG tablet Take 1 tablet (20 mg total) by mouth daily. 31 tablet 11   No current facility-administered medications for this visit.   Facility-Administered Medications Ordered in Other Visits  Medication Dose Route Frequency Provider Last Rate Last Dose  . sodium chloride 0.9 % injection 10 mL  10 mL Intravenous PRN [name], MD   10 mL at 04/25/13 1100    Allergies:   Other    Social History:  The patient  reports that he quit smoking about 13 months ago. His smoking use included Cigarettes. He has a 53 pack-year smoking history. He has never used smokeless tobacco. He reports that he does not drink alcohol or use illicit drugs.   Family  History:  The patient's family history includes Colon cancer in his daughter; Diabetes in his father; Heart disease in his father; Osteoarthritis in his father and mother.    ROS:  Please see the history of present illness.    Review of Systems: Constitutional:  denies fever, chills, diaphoresis, he has a very poor appetite  and fatigue.  HEENT: denies photophobia, eye pain, redness, hearing loss, ear pain, congestion, sore throat, rhinorrhea, sneezing, neck pain, neck stiffness and tinnitus.  Respiratory: admits to SOB, DOE, cough, chest  tightness, and wheezing.  Sleeps on several pillows.   Cardiovascular: denies chest pain, palpitations .  Has had  Progressive eg swelling.  Gastrointestinal: denies nausea, vomiting, abdominal pain, diarrhea, constipation, blood in stool.  Genitourinary: denies dysuria, urgency, frequency, hematuria, flank pain and difficulty urinating.  Musculoskeletal: denies  myalgias, back pain, joint swelling, arthralgias and gait problem.   Skin: denies pallor, rash and wound.  Neurological: denies dizziness, seizures, syncope, weakness, light-headedness, numbness and headaches.   Hematological: denies adenopathy, easy bruising, personal or family bleeding history.  Psychiatric/ Behavioral: denies suicidal ideation, mood changes, confusion, nervousness, sleep disturbance and agitation.       All other systems are reviewed and negative.    PHYSICAL EXAM: VS:  BP 128/78 mmHg  Pulse 82  Ht $R'6\' 3"'uM$  (1.905 m)  Wt 315 lb 12.8 oz (143.246 kg)  BMI 39.47 kg/m2  SpO2 98% , BMI Body mass index is 39.47 kg/(m^2). GEN: Well nourished, well developed, in no acute distress HEENT: normal Neck: elevated  JVD, carotid bruits, or masses,  He has a port on the right side of his chest  Cardiac: RRR; 2/6 systolic murmur,  ,  1+ pitting edema , chronic stasis changes   Respiratory:  Bilateral wheezes, few rales.  Increase work of breathing  GI: abdomen is very tense MS: no  deformity or atrophy Skin: warm and dry, no rash Neuro:  Strength and sensation are intact Psych: normal   EKG:  EKG is not ordered today. The ekg ordered today demonstrates   Recent Labs: 02/04/2014: TSH 0.76 06/25/2014: Pro B Natriuretic peptide (BNP) 102.0* 08/19/2014: Hemoglobin 11.5*; Platelets 209 08/27/2014: ALT 14; BUN 44*; Creatinine 1.77*; Potassium 4.6; Sodium 134*    Lipid Panel    Component Value Date/Time   CHOL 141 05/19/2014 0844   TRIG 149.0 05/19/2014 0844   HDL 31.50* 05/19/2014 0844   CHOLHDL 4 05/19/2014 0844   VLDL 29.8 05/19/2014 0844   LDLCALC 80 05/19/2014 0844      Wt Readings from Last 3 Encounters:  09/12/14 315 lb 12.8 oz (143.246 kg)  08/22/14 307 lb 6.4 oz (139.436 kg)  08/22/14 307 lb (139.254 kg)      Other studies Reviewed: Additional studies/ records that were reviewed today include: . Review of the above records demonstrates:    ASSESSMENT AND PLAN:  1. Coronary artery disease - no recent angina. 2. Acute on chronic combined systolic and diastolic congestive heart failure: Knight presents today with decompensated heart failure. He's gained another 15 pounds over the past several months. We had to decrease his torsemide dose because of chronic renal disease.   He has PND and orthopnea .   At this point I do not think that we'll be able to treat him with oral medications. We will admitted to the hospital and start him on IV Lasix. We may need heart failure consult.  3. Aortic stenosis - moderate - continue medications. He'll need additional torsemide  4. Non hodgekins lymphoma 5. Type 2 diabetes mellitus- in his work on a better diet. 6. Sleep apnea   Current medicines are reviewed at length with the patient today.  The patient does not have concerns regarding medicines.  The following changes have been made:  no change   Disposition:   FU with me in 6 months.     Signed, Allex Lapoint, Wonda Cheng, MD  09/12/2014 9:46 AM    [address], [place], [place]  [address] Phone: [phone]; Fax: [phone]

## 2014-09-12 NOTE — H&P (Signed)
Cardiology Office Note   Date: 09/12/2014   ID: [name] [name], DOB Feb 24, 1946, MRN [id number]  PCP: Cathlean Cower, MD Cardiologist: Acie Fredrickson Wonda Cheng, MD   Chief Complaint  Patient presents with  . Follow-up    CHF   Problem List: 1. Coronary artery disease 2. Ischemic cardiopathy with an ejection fraction of around 35% 3. Aortic stenosis - moderate 4. Non hodgekins lymphoma 5. Type 2 diabetes mellitus 6. Sleep apnea  History of Present Illness: [name] [name] is a 69 y.o. male who presents for follow up of CHF.   Breathing is ok. Has gained weight.   He has been eating too many donuts.  A new shop opened up near his house. Marland Kitchen  He is not having any worsening dyspnea. No cough. Has gained weight - about 40 lbs in the past 6 months   Feb. 26, 2016:  [name] [name] presents today - very short of breath. Has gained 8 lbs since I last seen him several weeks ago.  No CP , Severe lack of engegy  Has been feeling this poorly for the past 2 weeks. He has continued to gain weight.   Past Medical History  Diagnosis Date  . Ischemic cardiomyopathy   . Type II diabetes mellitus   . Hypertension   . Hypercholesterolemia   . Lower GI bleeding 1980's    "when I was drinking alot" (06/06/2012)  . Arthritis     "both shoulders" (06/06/2012)  . Chronic combined systolic and diastolic CHF (congestive heart failure) 07/10/2012    a. EF 63-14%, + diastolic dysfunction by echo 12/2012.  Marland Kitchen GERD (gastroesophageal reflux disease) 07/12/2012  . OSA on CPAP     uses a cpap  . Myocardial infarction 11/13  . Coronary artery disease     a. Cath 05/2012 with CTO of RCA; s/p BMS to severe OM-1 stenosis.  Marland Kitchen History of alcoholism   . Sleep apnea   . Moderate aortic stenosis     a. Mod by echo 12/2012.  Marland Kitchen Pancreatitis   . Diverticulosis   . Personal history of rectal adenoma 05/28/2013  .  Dietary noncompliance   . Morbid obesity   . Lymphoma     a. 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  . Shortness of breath     Past Surgical History  Procedure Laterality Date  . Coronary angioplasty with stent placement  06/06/2012    "1"  . Lymph gland excision  07/13/2012    Procedure: CERVICAL LYMPH GLAND EXCISION; Surgeon: Haywood Lasso, MD; Location: [facility]; Service: General; Laterality: Right;  . Portacath placement  07/30/2012    Procedure: INSERTION PORT-A-CATH; Surgeon: Haywood Lasso, MD; Location: [facility]; Service: General; Laterality: Right;  . Colonoscopy N/A 05/28/2013    Procedure: COLONOSCOPY; Surgeon: Gatha Mayer, MD; Location: [facility]; Service: Endoscopy; Laterality: N/A;  . Left and right heart catheterization with coronary angiogram N/A 06/06/2012    Procedure: LEFT AND RIGHT HEART CATHETERIZATION WITH CORONARY ANGIOGRAM; Surgeon: Pixie Casino, MD; Location: [facility] CATH LAB; Service: Cardiovascular; Laterality: N/A;  . Percutaneous coronary stent intervention (pci-s)  06/06/2012    Procedure: PERCUTANEOUS CORONARY STENT INTERVENTION (PCI-S); Surgeon: Troy Sine, MD; Location: [facility] CATH LAB; Service: Cardiovascular;;     Current Outpatient Prescriptions  Medication Sig Dispense Refill  . albuterol (PROVENTIL HFA;VENTOLIN HFA) 108 (90 BASE) MCG/ACT inhaler Inhale 2 puffs into the lungs every 6 (six) hours as needed for wheezing  or shortness of breath. 1 Inhaler 11  . AMBULATORY NON FORMULARY MEDICATION Home O2 @@ 2 LMP for 8 hrs at night    . AMBULATORY NON FORMULARY MEDICATION CPAP    . aspirin EC 81 MG tablet Take 81 mg by mouth daily.    Marland Kitchen atorvastatin (LIPITOR) 20 MG tablet Take 1 tablet (20 mg total) by mouth daily at 6 PM. 90 tablet 3   . Blood Glucose Monitoring Suppl (ACCU-CHEK AVIVA PLUS) W/DEVICE KIT Use to check blood sugars twice a day Dx 250.00 1 kit 0  . carvedilol (COREG) 12.5 MG tablet Take 1 tablet (12.5 mg total) by mouth 2 (two) times daily. 180 tablet 3  . glipiZIDE (GLUCOTROL) 10 MG tablet Take 1 tablet (10 mg total) by mouth 2 (two) times daily before a meal. 180 tablet 3  . glucose blood (ACCU-CHEK AVIVA PLUS) test strip Use to check blood sugars twice a day Dx E11.9 60 each 3  . insulin glargine (LANTUS) 100 UNIT/ML injection Inject 0.35 mLs (35 Units total) into the skin at bedtime. (Patient taking differently: Inject 15 Units into the skin 2 (two) times daily. ) 10 mL 11  . insulin lispro (HUMALOG KWIKPEN) 100 UNIT/ML KiwkPen Inject 10 units three times a day (Patient taking differently: 15 Units 2 (two) times daily. ) 15 mL 3  . Insulin Pen Needle (BD PEN NEEDLE NANO U/F) 32G X 4 MM MISC Use as directed 1 per day 250.02 100 each 11  . lidocaine-prilocaine (EMLA) cream Apply 1 application topically as needed. 30 g 3  . lisinopril (PRINIVIL,ZESTRIL) 10 MG tablet Take 1 tablet (10 mg total) by mouth daily. 30 tablet 11  . potassium chloride (K-DUR) 10 MEQ tablet TAKE TWO TABLETS BY MOUTH ONCE DAILY 180 tablet 2  . sitaGLIPtin (JANUVIA) 50 MG tablet Take 50 mg by mouth daily.     Marland Kitchen tiotropium (SPIRIVA) 18 MCG inhalation capsule Place 18 mcg into inhaler and inhale daily as needed. Uses for shortness of breath.    . torsemide (DEMADEX) 20 MG tablet Take 1 tablet (20 mg total) by mouth daily. 31 tablet 11   No current facility-administered medications for this visit.   Facility-Administered Medications Ordered in Other Visits  Medication Dose Route Frequency Provider Last Rate Last Dose  . sodium chloride 0.9 % injection 10 mL 10 mL Intravenous PRN [name], MD  10 mL at 04/25/13 1100    Allergies: Other     Social History: The patient  reports that he quit smoking about 13 months ago. His smoking use included Cigarettes. He has a 53 pack-year smoking history. He has never used smokeless tobacco. He reports that he does not drink alcohol or use illicit drugs.   Family History: The patient's family history includes Colon cancer in his daughter; Diabetes in his father; Heart disease in his father; Osteoarthritis in his father and mother.    ROS: Please see the history of present illness.    Review of Systems: Constitutional:  denies fever, chills, diaphoresis, he has a very poor appetite and fatigue.  HEENT: denies photophobia, eye pain, redness, hearing loss, ear pain, congestion, sore throat, rhinorrhea, sneezing, neck pain, neck stiffness and tinnitus.  Respiratory: admits to SOB, DOE, cough, chest tightness, and wheezing. Sleeps on several pillows.   Cardiovascular: denies chest pain, palpitations . Has had Progressive eg swelling.  Gastrointestinal: denies nausea, vomiting, abdominal pain, diarrhea, constipation, blood in stool.  Genitourinary: denies dysuria, urgency, frequency, hematuria, flank pain  and difficulty urinating.  Musculoskeletal: denies myalgias, back pain, joint swelling, arthralgias and gait problem.   Skin: denies pallor, rash and wound.  Neurological: denies dizziness, seizures, syncope, weakness, light-headedness, numbness and headaches.   Hematological: denies adenopathy, easy bruising, personal or family bleeding history.  Psychiatric/ Behavioral: denies suicidal ideation, mood changes, confusion, nervousness, sleep disturbance and agitation.      All other systems are reviewed and negative.    PHYSICAL EXAM: VS: BP 128/78 mmHg  Pulse 82  Ht $R'6\' 3"'Ry$  (1.905 m)  Wt 315 lb 12.8 oz (143.246 kg)  BMI 39.47 kg/m2  SpO2 98% , BMI Body mass index is 39.47 kg/(m^2). GEN: Well nourished, well developed, in no acute distress  HEENT:  normal  Neck: elevated JVD, carotid bruits, or masses, He has a port on the right side of his chest  Cardiac: RRR; 2/6 systolic murmur, , 1+ pitting edema , chronic stasis changes   Respiratory: Bilateral wheezes, few rales. Increase work of breathing  GI: abdomen is very tense MS: no deformity or atrophy  Skin: warm and dry, no rash Neuro: Strength and sensation are intact Psych: normal   EKG: EKG is not ordered today. The ekg ordered today demonstrates   Recent Labs: 02/04/2014: TSH 0.76 06/25/2014: Pro B Natriuretic peptide (BNP) 102.0* 08/19/2014: Hemoglobin 11.5*; Platelets 209 08/27/2014: ALT 14; BUN 44*; Creatinine 1.77*; Potassium 4.6; Sodium 134*    Lipid Panel  Labs (Brief)       Component Value Date/Time   CHOL 141 05/19/2014 0844   TRIG 149.0 05/19/2014 0844   HDL 31.50* 05/19/2014 0844   CHOLHDL 4 05/19/2014 0844   VLDL 29.8 05/19/2014 0844   LDLCALC 80 05/19/2014 0844       Wt Readings from Last 3 Encounters:  09/12/14 315 lb 12.8 oz (143.246 kg)  08/22/14 307 lb 6.4 oz (139.436 kg)  08/22/14 307 lb (139.254 kg)      Other studies Reviewed: Additional studies/ records that were reviewed today include: . Review of the above records demonstrates:    ASSESSMENT AND PLAN:  1. Coronary artery disease - no recent angina. 2. Acute on chronic combined systolic and diastolic congestive heart failure: Jamile presents today with decompensated heart failure. He's gained another 15 pounds over the past several months. We had to decrease his torsemide dose because of chronic renal disease. He has PND and orthopnea . At this point I do not think that we'll be able to treat him with oral medications. We will admitted to the hospital and start him on IV Lasix. We may need heart failure consult.  3. Aortic stenosis - moderate - continue medications. He'll need additional torsemide  4. Non hodgekins lymphoma 5. Type  2 diabetes mellitus- in his work on a better diet. 6. Sleep apnea    Signed, Modell Fendrick, Wonda Cheng, MD  09/12/2014 9:46 AM  [facility] [address], [place], [place] [address] Phone: [phone]; Fax: [phone]

## 2014-09-13 DIAGNOSIS — I110 Hypertensive heart disease with heart failure: Secondary | ICD-10-CM | POA: Insufficient documentation

## 2014-09-13 DIAGNOSIS — I509 Heart failure, unspecified: Secondary | ICD-10-CM

## 2014-09-13 DIAGNOSIS — I25810 Atherosclerosis of coronary artery bypass graft(s) without angina pectoris: Secondary | ICD-10-CM

## 2014-09-13 DIAGNOSIS — I10 Essential (primary) hypertension: Secondary | ICD-10-CM

## 2014-09-13 DIAGNOSIS — G4733 Obstructive sleep apnea (adult) (pediatric): Secondary | ICD-10-CM

## 2014-09-13 LAB — BASIC METABOLIC PANEL
Anion gap: 8 (ref 5–15)
BUN: 35 mg/dL — AB (ref 6–23)
CO2: 26 mmol/L (ref 19–32)
CREATININE: 1.81 mg/dL — AB (ref 0.50–1.35)
Calcium: 9 mg/dL (ref 8.4–10.5)
Chloride: 103 mmol/L (ref 96–112)
GFR calc Af Amer: 43 mL/min — ABNORMAL LOW (ref >90)
GFR calc non Af Amer: 37 mL/min — ABNORMAL LOW (ref >90)
Glucose, Bld: 81 mg/dL (ref 70–99)
POTASSIUM: 4.4 mmol/L (ref 3.5–5.1)
Sodium: 137 mmol/L (ref 135–145)

## 2014-09-13 LAB — GLUCOSE, CAPILLARY
GLUCOSE-CAPILLARY: 79 mg/dL (ref 70–99)
GLUCOSE-CAPILLARY: 261 mg/dL — AB (ref 70–99)
GLUCOSE-CAPILLARY: 150 mg/dL — AB (ref 70–99)
Glucose-Capillary: 167 mg/dL — ABNORMAL HIGH (ref 70–99)

## 2014-09-13 LAB — TROPONIN I: TROPONIN I: 0.03 ng/mL (ref <0.031)

## 2014-09-13 MED ORDER — PERFLUTREN LIPID MICROSPHERE
1.0000 mL | INTRAVENOUS | Status: AC | PRN
  Administered 2014-09-13: 2 mL via INTRAVENOUS
  Filled 2014-09-13: qty 10

## 2014-09-13 NOTE — Progress Notes (Signed)
Echocardiogram 2D Echocardiogram with Definity has been performed.  [name] [name] 09/13/2014, 3:09 PM

## 2014-09-13 NOTE — Progress Notes (Signed)
SUBJECTIVE: The patient is doing well today.  At this time, he denies chest pain, shortness of breath, or any new concerns.  Marland Kitchen aspirin EC  81 mg Oral Daily  . atorvastatin  20 mg Oral q1800  . carvedilol  12.5 mg Oral BID WC  . enoxaparin (LOVENOX) injection  40 mg Subcutaneous Q24H  . furosemide  80 mg Intravenous Q12H  . glipiZIDE  10 mg Oral BID AC  . insulin aspart  10 Units Subcutaneous TID WC  . insulin glargine  35 Units Subcutaneous QHS  . linagliptin  5 mg Oral Daily  . lisinopril  10 mg Oral Daily  . potassium chloride  20 mEq Oral BID  . sodium chloride  3 mL Intravenous Q12H  . tiotropium  18 mcg Inhalation Daily      OBJECTIVE: Physical Exam: Filed Vitals:   09/12/14 2126 09/13/14 0203 09/13/14 0556 09/13/14 1055  BP: 108/55 119/59 96/47 111/63  Pulse: 69 63 68 62  Temp: 98.1 F (36.7 C) 98 F (36.7 C) 97.9 F (36.6 C)   TempSrc: Oral Oral Oral   Resp: 20 18 18    Height:      Weight:   305 lb 8 oz (138.574 kg)   SpO2: 94% 96% 94% 98%    Intake/Output Summary (Last 24 hours) at 09/13/14 1234 Last data filed at 09/13/14 1049  Gross per 24 hour  Intake   1430 ml  Output   2775 ml  Net  -1345 ml    Telemetry reveals sinus rhythm  GEN- The patient is overweight appearing, alert and oriented x 3 today.   Head- normocephalic, atraumatic Eyes-  Sclera clear, conjunctiva pink Ears- hearing intact Oropharynx- clear Neck- supple,  Lungs- bibasilar rales, normal work of breathing Heart- Regular rate and rhythm, no murmurs, rubs or gallops, PMI not laterally displaced GI- soft, NT, ND, + BS Extremities- no clubbing, cyanosis, or edema Skin- no rash or lesion Psych- euthymic mood, full affect Neuro- strength and sensation are intact  LABS: Basic Metabolic Panel:  Recent Labs  09/12/14 1413 09/13/14 0028  NA 136 137  K 5.0 4.4  CL 102 103  CO2 28 26  GLUCOSE 195* 81  BUN 32* 35*  CREATININE 1.76* 1.81*  CALCIUM 9.2 9.0  MG 2.3  --     Liver Function Tests:  Recent Labs  09/12/14 1413  AST 17  ALT 15  ALKPHOS 68  BILITOT 0.5  PROT 7.2  ALBUMIN 3.9   No results for input(s): LIPASE, AMYLASE in the last 72 hours. CBC:  Recent Labs  09/12/14 1413  WBC 8.0  NEUTROABS 5.5  HGB 10.6*  HCT 32.4*  MCV 91.0  PLT 196   Cardiac Enzymes:  Recent Labs  09/12/14 1413 09/12/14 1919 09/13/14 0028  TROPONINI 0.03 <0.03 0.03   BNP: Invalid input(s): POCBNP D-Dimer: No results for input(s): DDIMER in the last 72 hours. Hemoglobin A1C: No results for input(s): HGBA1C in the last 72 hours. Fasting Lipid Panel: No results for input(s): CHOL, HDL, LDLCALC, TRIG, CHOLHDL, LDLDIRECT in the last 72 hours. Thyroid Function Tests:  Recent Labs  09/12/14 1413  TSH 0.487   Anemia Panel: No results for input(s): VITAMINB12, FOLATE, FERRITIN, TIBC, IRON, RETICCTPCT in the last 72 hours.  RADIOLOGY: Ct Abdomen Pelvis Wo Contrast  08/19/2014   CLINICAL DATA:  Restaging of lymphoma. Diagnosed in 2014. Chemotherapy complete.  EXAM: CT CHEST, ABDOMEN AND PELVIS WITHOUT CONTRAST  TECHNIQUE: Multidetector CT imaging of the  chest, abdomen and pelvis was performed following the standard protocol without IV contrast.  COMPARISON:  08/12/2014 chest radiograph.  10/02/2013 CTs.  FINDINGS: CT CHEST FINDINGS  Mediastinum/Nodes: Low left jugular node measures 1.4 cm on image 1 and is new or enlarged since the prior. A right internal jugular line terminates at the low SVC. Increased number of bilateral subpectoral nodes, new. Aortic and branch vessel atherosclerosis. Mild cardiomegaly. Multivessel coronary artery atherosclerosis. Pulmonary artery enlargement, outflow tract 3.5 cm.  A low right paratracheal node measures 1.3 cm on image 23 versus 1.2 cm on the prior  Subcarinal node measures 1.4 cm today versus 1.1 cm on the prior (when remeasured). Hilar regions poorly evaluated without intravenous contrast.  Prevascular node measures 1.1  cm on image 22 versus 9 mm on the prior  Lungs/Pleura: Mild centrilobular emphysema.  No pleural fluid.  Musculoskeletal: No acute osseous abnormality.  CT ABDOMEN PELVIS FINDINGS  Hepatobiliary: Degradation, secondary to patient size and arm position, not raised above the head. Lack of IV contrast also limits exam. Grossly normal liver. Normal gallbladder, without biliary ductal dilatation.  Pancreas: Normal, without mass or ductal dilatation.  Spleen: Normal  Adrenals/Urinary Tract: Normal adrenal glands. Bilateral renal vascular calcifications. No bladder calculi.  Stomach/Bowel: Normal stomach, without wall thickening. Scattered colonic diverticula. Normal terminal ileum and appendix. Normal small bowel.  Vascular/Lymphatic: Aortic and branch vessel atherosclerosis. No abdominopelvic adenopathy.  Reproductive: Mild prostatomegaly.  Other: No significant free fluid.  Musculoskeletal: Degenerative partial fusion of the bilateral sacroiliac joints. Bilateral glenohumeral joint osteoarthritis.  IMPRESSION: CT CHEST IMPRESSION  1. Slight progression of thoracic adenopathy with suspicion of developing lower cervical adenopathy. 2.  Atherosclerosis, including within the coronary arteries. 3. Pulmonary artery enlargement suggests pulmonary arterial hypertension.  CT ABDOMEN AND PELVIS IMPRESSION  1. No acute process or evidence of active lymphoma within the abdomen or pelvis. 2. Decreased sensitivity and specificity exam due to technique related factors, as described above.   Electronically Signed   By: Abigail Miyamoto M.D.   On: 08/19/2014 10:50   Ct Chest Wo Contrast  08/19/2014   CLINICAL DATA:  Restaging of lymphoma. Diagnosed in 2014. Chemotherapy complete.  EXAM: CT CHEST, ABDOMEN AND PELVIS WITHOUT CONTRAST  TECHNIQUE: Multidetector CT imaging of the chest, abdomen and pelvis was performed following the standard protocol without IV contrast.  COMPARISON:  08/12/2014 chest radiograph.  10/02/2013 CTs.  FINDINGS: CT  CHEST FINDINGS  Mediastinum/Nodes: Low left jugular node measures 1.4 cm on image 1 and is new or enlarged since the prior. A right internal jugular line terminates at the low SVC. Increased number of bilateral subpectoral nodes, new. Aortic and branch vessel atherosclerosis. Mild cardiomegaly. Multivessel coronary artery atherosclerosis. Pulmonary artery enlargement, outflow tract 3.5 cm.  A low right paratracheal node measures 1.3 cm on image 23 versus 1.2 cm on the prior  Subcarinal node measures 1.4 cm today versus 1.1 cm on the prior (when remeasured). Hilar regions poorly evaluated without intravenous contrast.  Prevascular node measures 1.1 cm on image 22 versus 9 mm on the prior  Lungs/Pleura: Mild centrilobular emphysema.  No pleural fluid.  Musculoskeletal: No acute osseous abnormality.  CT ABDOMEN PELVIS FINDINGS  Hepatobiliary: Degradation, secondary to patient size and arm position, not raised above the head. Lack of IV contrast also limits exam. Grossly normal liver. Normal gallbladder, without biliary ductal dilatation.  Pancreas: Normal, without mass or ductal dilatation.  Spleen: Normal  Adrenals/Urinary Tract: Normal adrenal glands. Bilateral renal vascular calcifications. No bladder  calculi.  Stomach/Bowel: Normal stomach, without wall thickening. Scattered colonic diverticula. Normal terminal ileum and appendix. Normal small bowel.  Vascular/Lymphatic: Aortic and branch vessel atherosclerosis. No abdominopelvic adenopathy.  Reproductive: Mild prostatomegaly.  Other: No significant free fluid.  Musculoskeletal: Degenerative partial fusion of the bilateral sacroiliac joints. Bilateral glenohumeral joint osteoarthritis.  IMPRESSION: CT CHEST IMPRESSION  1. Slight progression of thoracic adenopathy with suspicion of developing lower cervical adenopathy. 2.  Atherosclerosis, including within the coronary arteries. 3. Pulmonary artery enlargement suggests pulmonary arterial hypertension.  CT ABDOMEN  AND PELVIS IMPRESSION  1. No acute process or evidence of active lymphoma within the abdomen or pelvis. 2. Decreased sensitivity and specificity exam due to technique related factors, as described above.   Electronically Signed   By: Abigail Miyamoto M.D.   On: 08/19/2014 10:50    ASSESSMENT AND PLAN:  Active Problems:   CHF (congestive heart failure), NYHA class III    1. Coronary artery disease - no recent angina.  2. Acute on chronic combined systolic and diastolic congestive heart failure:  Continue IV diuresis one more day Per Dr Acie Fredrickson, we may need heart failure consult. Hopefully can convert to oral diuretics tomorrow  3. Aortic stenosis - moderate - continue medications. He'll need additional torsemide  4. Non hodgekins lymphoma  5. Type 2 diabetes mellitus- Stable No change required today  6. Sleep apnea- uses CPAP    Thompson Grayer, MD 09/13/2014 12:34 PM

## 2014-09-14 LAB — GLUCOSE, CAPILLARY
GLUCOSE-CAPILLARY: 126 mg/dL — AB (ref 70–99)
GLUCOSE-CAPILLARY: 172 mg/dL — AB (ref 70–99)
GLUCOSE-CAPILLARY: 211 mg/dL — AB (ref 70–99)
Glucose-Capillary: 124 mg/dL — ABNORMAL HIGH (ref 70–99)

## 2014-09-14 LAB — BASIC METABOLIC PANEL
Anion gap: 8 (ref 5–15)
BUN: 44 mg/dL — AB (ref 6–23)
CALCIUM: 9 mg/dL (ref 8.4–10.5)
CO2: 26 mmol/L (ref 19–32)
CREATININE: 1.91 mg/dL — AB (ref 0.50–1.35)
Chloride: 102 mmol/L (ref 96–112)
GFR calc non Af Amer: 35 mL/min — ABNORMAL LOW (ref >90)
GFR, EST AFRICAN AMERICAN: 40 mL/min — AB (ref >90)
Glucose, Bld: 165 mg/dL — ABNORMAL HIGH (ref 70–99)
Potassium: 4.6 mmol/L (ref 3.5–5.1)
Sodium: 136 mmol/L (ref 135–145)

## 2014-09-14 NOTE — Progress Notes (Addendum)
SUBJECTIVE: The patient is doing well today. His breathing is better.  At this time, he denies chest pain or any new concerns. He is getting agrivated and wants to go home soon.  Marland Kitchen aspirin EC  81 mg Oral Daily  . atorvastatin  20 mg Oral q1800  . carvedilol  12.5 mg Oral BID WC  . enoxaparin (LOVENOX) injection  40 mg Subcutaneous Q24H  . furosemide  80 mg Intravenous Q12H  . glipiZIDE  10 mg Oral BID AC  . insulin aspart  10 Units Subcutaneous TID WC  . insulin glargine  35 Units Subcutaneous QHS  . linagliptin  5 mg Oral Daily  . lisinopril  10 mg Oral Daily  . potassium chloride  20 mEq Oral BID  . sodium chloride  3 mL Intravenous Q12H  . tiotropium  18 mcg Inhalation Daily      OBJECTIVE: Physical Exam: Filed Vitals:   09/13/14 2100 09/14/14 0218 09/14/14 0612 09/14/14 0800  BP: 118/69 116/84 93/49 120/53  Pulse: 72 70 66   Temp: 98.4 F (36.9 C) 98.6 F (37 C) 97.8 F (36.6 C)   TempSrc: Oral Oral Oral   Resp: 18 18 18    Height:      Weight:   307 lb 8 oz (139.481 kg)   SpO2: 98% 100% 98%     Intake/Output Summary (Last 24 hours) at 09/14/14 1130 Last data filed at 09/14/14 1000  Gross per 24 hour  Intake   1440 ml  Output   2555 ml  Net  -1115 ml    Telemetry reveals sinus rhythm  GEN- The patient is overweight appearing, alert and oriented x 3 today.   Head- normocephalic, atraumatic Eyes-  Sclera clear, conjunctiva pink Ears- hearing intact Oropharynx- clear Neck- supple,  Lungs- few basilar rales, normal work of breathing Heart- Regular rate and rhythm, no murmurs, rubs or gallops, PMI not laterally displaced GI- soft, NT, ND, + BS Extremities- no clubbing, cyanosis, trace edema Skin- no rash or lesion Psych- euthymic mood, full affect Neuro- strength and sensation are intact  LABS: Basic Metabolic Panel:  Recent Labs  09/12/14 1413 09/13/14 0028 09/14/14 0408  NA 136 137 136  K 5.0 4.4 4.6  CL 102 103 102  CO2 28 26 26   GLUCOSE  195* 81 165*  BUN 32* 35* 44*  CREATININE 1.76* 1.81* 1.91*  CALCIUM 9.2 9.0 9.0  MG 2.3  --   --    Liver Function Tests:  Recent Labs  09/12/14 1413  AST 17  ALT 15  ALKPHOS 68  BILITOT 0.5  PROT 7.2  ALBUMIN 3.9   No results for input(s): LIPASE, AMYLASE in the last 72 hours. CBC:  Recent Labs  09/12/14 1413  WBC 8.0  NEUTROABS 5.5  HGB 10.6*  HCT 32.4*  MCV 91.0  PLT 196   Cardiac Enzymes:  Recent Labs  09/12/14 1413 09/12/14 1919 09/13/14 0028  TROPONINI 0.03 <0.03 0.03   Thyroid Function Tests:  Recent Labs  09/12/14 1413  TSH 0.487   Anemia Panel: ASSESSMENT AND PLAN:  1. Coronary artery disease - no recent angina.  2. Acute on chronic combined systolic and diastolic congestive heart failure:  Continue IV diuresis one more day Per Dr Acie Fredrickson, we may need heart failure consult.  3. Aortic stenosis - moderate - continue medications. He'll need additional torsemide  4. Non hodgekins lymphoma  5. Type 2 diabetes mellitus- Stable He says that he is not taking glipizide  He declines diabetic diet  6. Sleep apnea- uses CPAP  Likely to discharge tomorrow  Thompson Grayer, MD 09/14/2014 11:30 AM

## 2014-09-15 ENCOUNTER — Inpatient Hospital Stay (HOSPITAL_COMMUNITY): Payer: Commercial Managed Care - HMO

## 2014-09-15 DIAGNOSIS — I350 Nonrheumatic aortic (valve) stenosis: Secondary | ICD-10-CM

## 2014-09-15 LAB — BASIC METABOLIC PANEL
Anion gap: 7 (ref 5–15)
BUN: 47 mg/dL — AB (ref 6–23)
CALCIUM: 8.9 mg/dL (ref 8.4–10.5)
CO2: 26 mmol/L (ref 19–32)
CREATININE: 1.93 mg/dL — AB (ref 0.50–1.35)
Chloride: 102 mmol/L (ref 96–112)
GFR calc non Af Amer: 34 mL/min — ABNORMAL LOW (ref >90)
GFR, EST AFRICAN AMERICAN: 40 mL/min — AB (ref >90)
GLUCOSE: 173 mg/dL — AB (ref 70–99)
POTASSIUM: 4.6 mmol/L (ref 3.5–5.1)
Sodium: 135 mmol/L (ref 135–145)

## 2014-09-15 LAB — GLUCOSE, CAPILLARY
GLUCOSE-CAPILLARY: 129 mg/dL — AB (ref 70–99)
GLUCOSE-CAPILLARY: 156 mg/dL — AB (ref 70–99)
Glucose-Capillary: 172 mg/dL — ABNORMAL HIGH (ref 70–99)
Glucose-Capillary: 157 mg/dL — ABNORMAL HIGH (ref 70–99)

## 2014-09-15 MED ORDER — FUROSEMIDE 10 MG/ML IJ SOLN
80.0000 mg | Freq: Two times a day (BID) | INTRAMUSCULAR | Status: DC
  Administered 2014-09-15 (×2): 80 mg via INTRAVENOUS
  Filled 2014-09-15 (×2): qty 8

## 2014-09-15 NOTE — Progress Notes (Signed)
Medicare Important Message given? YES  Date Medicare IM given:  09/15/2014 Medicare IM given by: Bogdan Vivona  

## 2014-09-15 NOTE — Progress Notes (Signed)
Inpatient Diabetes Program Recommendations  AACE/ADA: New Consensus Statement on Inpatient Glycemic Control (2013)  Target Ranges:  Prepandial:   less than 140 mg/dL      Peak postprandial:   less than 180 mg/dL (1-2 hours)      Critically ill patients:  140 - 180 mg/dL   Reason for Assessment:  Results for [name] [name], [name] [name] (MRN [id number]) as of 09/15/2014 10:53  Ref. Range 09/14/2014 06:08 09/14/2014 12:47 09/14/2014 16:04 09/14/2014 21:33 09/15/2014 06:06  Glucose-Capillary Latest Range: 70-99 mg/dL 126 (H) 124 (H) 172 (H) 211 (H) 129 (H)  Results for [name] [name], [name] [name] (MRN [id number]) as of 09/15/2014 10:53  Ref. Range 08/27/2014 09:53  Hemoglobin A1C Latest Range: 4.6-6.5 % 8.7 (H)   Diabetes history: Type 2 diabetes Outpatient Diabetes medications: Januvia 25 mg daily, Novolog 10 units tid with meals, Lantus 35 units daily, Glucotrol 10 mg daily Current orders for Inpatient glycemic control:  Lantus 35 units daily, Novolog 10 units tid with meals, Tradjenta 5 mg daily  May consider adding Novolog moderate tid with meals for correction insulin. Also please consider adding Hold parameters to Novolog meal coverage 10 units (Hold if patient eats less than 50% or NPO).  Thanks, Adah Perl, RN, BC-ADM Inpatient Diabetes Coordinator Pager [phone]

## 2014-09-15 NOTE — Progress Notes (Addendum)
Subjective: Breathing better.  No CP  Objective: Vital signs in last 24 hours: Temp:  [97.4 F (36.3 C)-97.9 F (36.6 C)] 97.6 F (36.4 C) (02/29 0631) Pulse Rate:  [70-79] 70 (02/29 0631) Resp:  [16-19] 19 (02/29 0631) BP: (94-120)/(53-65) 119/63 mmHg (02/29 0631) SpO2:  [98 %] 98 % (02/29 0631) Weight:  [307 lb 5.1 oz (139.4 kg)] 307 lb 5.1 oz (139.4 kg) (02/29 0631) Last BM Date: 09/12/14  Intake/Output from previous day: 02/28 0701 - 02/29 0700 In: 1422 [P.O.:1422] Out: 3275 [Urine:3275] Intake/Output this shift:    Medications Current Facility-Administered Medications  Medication Dose Route Frequency Provider Last Rate Last Dose  . 0.9 %  sodium chloride infusion  250 mL Intravenous PRN Thayer Headings, MD      . acetaminophen (TYLENOL) tablet 650 mg  650 mg Oral Q4H PRN Thayer Headings, MD      . aspirin EC tablet 81 mg  81 mg Oral Daily Thayer Headings, MD   81 mg at 09/14/14 1028  . atorvastatin (LIPITOR) tablet 20 mg  20 mg Oral q1800 Thayer Headings, MD   20 mg at 09/14/14 1813  . carvedilol (COREG) tablet 12.5 mg  12.5 mg Oral BID WC Thayer Headings, MD   12.5 mg at 09/14/14 1813  . enoxaparin (LOVENOX) injection 40 mg  40 mg Subcutaneous Q24H Thayer Headings, MD   40 mg at 09/14/14 1248  . furosemide (LASIX) injection 80 mg  80 mg Intravenous Q12H Thayer Headings, MD   80 mg at 09/15/14 0036  . insulin aspart (novoLOG) injection 10 Units  10 Units Subcutaneous TID WC Thayer Headings, MD   10 Units at 09/14/14 1813  . insulin glargine (LANTUS) injection 35 Units  35 Units Subcutaneous QHS Thayer Headings, MD   35 Units at 09/14/14 2135  . linagliptin (TRADJENTA) tablet 5 mg  5 mg Oral Daily Thayer Headings, MD   5 mg at 09/14/14 1029  . lisinopril (PRINIVIL,ZESTRIL) tablet 10 mg  10 mg Oral Daily Thayer Headings, MD   10 mg at 09/14/14 1029  . ondansetron (ZOFRAN) injection 4 mg  4 mg Intravenous Q6H PRN Thayer Headings, MD      . potassium chloride SA  (K-DUR,KLOR-CON) CR tablet 20 mEq  20 mEq Oral BID Thayer Headings, MD   20 mEq at 09/14/14 2114  . sodium chloride 0.9 % injection 3 mL  3 mL Intravenous Q12H Thayer Headings, MD   3 mL at 09/14/14 2115  . sodium chloride 0.9 % injection 3 mL  3 mL Intravenous PRN Thayer Headings, MD       Facility-Administered Medications Ordered in Other Encounters  Medication Dose Route Frequency Provider Last Rate Last Dose  . sodium chloride 0.9 % injection 10 mL  10 mL Intravenous PRN [name], MD   10 mL at 04/25/13 1100    PE: General appearance: alert, cooperative and no distress Lungs: Decreased BS throughout.  No wheeze or Rales Heart: regular rate and rhythm and 2/6 Sys MM Abdomen: +BS nontender.  Tense Extremities: 1+ LEE Pulses: 1+ radials Skin: Warm and dry Neurologic: Grossly normal  Lab Results:   Recent Labs  09/12/14 1413  WBC 8.0  HGB 10.6*  HCT 32.4*  PLT 196   BMET  Recent Labs  09/13/14 0028 09/14/14 0408 09/15/14 0345  NA 137 136 135  K 4.4 4.6 4.6  CL 103  102 102  CO2 26 26 26   GLUCOSE 81 165* 173*  BUN 35* 44* 47*  CREATININE 1.81* 1.91* 1.93*  CALCIUM 9.0 9.0 8.9    Assessment/Plan    Acute on chronic CHF (congestive heart failure), NYHA class III Net fluids:  -1.9L/-4.6L.  Echo: EF moderately depressed. akinesis of the base/mid anterior wall; basal inferior wall, basal posteriorwall.  Moderate to severe AS, Peak and mean gradients-54 and 35 mm Hg.  EF in Sept 55-60% with G1DD.   SCr trending up.  May be intravascularly dry.  Pt states UOP decreased now compared to Saturday.  Last dose of lasix 0036hrs.   Transition to PO meds. CXR today.     Coronary artery disease involving coronary bypass graft of native heart without angina pectoris  EF appears down.  No complaints of angina   Obstructive sleep apnea  Uses CPAP   Essential hypertension  Been a little soft.    AS, moderate   DM 2   Non Hodgekins Lymph   Morbid obesity    LOS: 3  days    [name], BRYAN PA-C 09/15/2014 7:47 AM  Attending Note:   The patient was seen and examined.  Agree with assessment and plan as noted above.  Changes made to the above note as needed.  [name] [name] is a bit better but is depressed about being in the hospital . His diet was changed to regular yesterday but now he complains about eating too many carbs.  He has diuresed but is not losing any weight. He gets his nighttime lasix dose late at night and then is up urinating all night.   I had a long discussion with him about our plan.  1. Acute on  Chronic combined systolic/ diastolic CHF:  He needs to stay and get diuresed - I think he still has 10-15 lbs of fluid in him We then need to transition to PO Demadex and make sure that he still is diuresing.  I have personally reviewed the echo from 2/27 and he has moderate LF dysfunction with EF of ~40-45% by my review. Encouraged him to elevated his legs.    2. Aortic stenosis:  Very difficult to assess.   Mean AV gradient of 35 mmHg.  Will continue to diurese.   3. DM:  I have changed his diet back to carb modified.   4.     Thayer Headings, Brooke Bonito., MD, [facility] 09/15/2014, 8:38 AM [address]  [place] Pager [phone]

## 2014-09-16 ENCOUNTER — Other Ambulatory Visit: Payer: Self-pay | Admitting: Physician Assistant

## 2014-09-16 DIAGNOSIS — N189 Chronic kidney disease, unspecified: Secondary | ICD-10-CM

## 2014-09-16 LAB — GLUCOSE, CAPILLARY
GLUCOSE-CAPILLARY: 117 mg/dL — AB (ref 70–99)
Glucose-Capillary: 182 mg/dL — ABNORMAL HIGH (ref 70–99)

## 2014-09-16 MED ORDER — TORSEMIDE 20 MG PO TABS: 20.0000 mg | ORAL_TABLET | Freq: Two times a day (BID) | ORAL | Status: DC

## 2014-09-16 MED ORDER — TORSEMIDE 20 MG PO TABS
20.0000 mg | ORAL_TABLET | Freq: Two times a day (BID) | ORAL | Status: DC
  Administered 2014-09-16: 20 mg via ORAL
  Filled 2014-09-16 (×2): qty 1

## 2014-09-16 MED ORDER — TORSEMIDE 20 MG PO TABS: 40.0000 mg | ORAL_TABLET | Freq: Two times a day (BID) | ORAL | Status: DC

## 2014-09-16 MED ORDER — ALPRAZOLAM 0.5 MG PO TABS: 0.5000 mg | ORAL_TABLET | Freq: Two times a day (BID) | ORAL | Status: DC | PRN

## 2014-09-16 NOTE — Discharge Summary (Signed)
Physician Discharge Summary     Cardiologist:   Patient ID: [name] [name] MRN: [id number] DOB/AGE: [birth year] 69 y.o.  Admit date: 09/12/2014 Discharge date: 09/16/2014  Admission Diagnoses:    Discharge Diagnoses:  Active Problems:   CHF (congestive heart failure), NYHA class III   Coronary artery disease involving coronary bypass graft of native heart without angina pectoris   Obstructive sleep apnea   Essential hypertension   Discharged Condition: stable  Hospital Course:   [name] [name] is a 69 y.o. male with a history of ischemic myopathy, diabetes mellitus type 2, hypertension, hyperlipidemia, GI bleed, chronic combined systolic and diastolic heart failure, obstructive sleep apnea on CPAP, coronary artery disease with a cath in November 2013 with CTO of the RCA bare-metal stent to the OM1, EtOH abuse, pancreatitis, moderate aortic stenosis, morbid obesity, lymphoma.  The patient presented to the office for follow up of CHF.  Breathing is ok. Has gained weight. He has been eating too many donuts. A new shop opened up near his house.  He is not having any worsening dyspnea. No cough.  Has gained weight - about 40 lbs in the past 6 months   Patient was admitted for IV diuresis. This resulted in net fluid loss of 5.5 L. He was converted to by mouth Demadex and ultimately had improvement in symptoms. His mild increase in serum creatinine which will be redrawn after discharge on March 3.  A 2-D echocardiogram which revealed an ejection fraction approximate 40-45%. With peak and mean gradients of the aortic valve of 54 and 35 mm respectively. This is consistent with moderate to severe yes. The patient was seen by Dr. Stanford Breed who felt he was stable for DC home.   Consults: Diabetes coronary  Significant Diagnostic Studies: 2-D echocardiogram Study Conclusions  - Left ventricle: Poor acoustic windows limit study, even with contrast. Ovreall LVEF is mderately depressed with  akinesis of the base/mid anterior wall; basal inferior wall, basal posterior wall. Compared to prior echo (again difficult) LVEF appears worse. The cavity size was normal. Wall thickness was normal. - Aortic valve: AV is thickened, calcified with restricted motion. Peak and mean gradients through the valve are 54 and 35 mm Hg respectively consistent with moderate to severe AS. There was trivial regurgitation. - Left atrium: The atrium was moderately dilated.   Treatments: See above  Discharge Exam: Blood pressure 105/45, pulse 74, temperature 97.4 F (36.3 C), temperature source Oral, resp. rate 18, height $RemoveBe'6\' 3"'[id number]$  (1.905 m), weight 305 lb 4.8 oz (138.483 kg), SpO2 96 %.   Disposition: 01-Home or Self Care  Discharge Instructions    Diet - low sodium heart healthy    Complete by:  As directed      Discharge instructions    Complete by:  As directed   Monitor your weight every morning.  If you gain 3 pounds in 24 hours, or 5 pounds in a week, call the office for instructions.     Increase activity slowly    Complete by:  As directed             Medication List    STOP taking these medications        glipiZIDE 10 MG tablet  Commonly known as:  GLUCOTROL     lidocaine-prilocaine cream  Commonly known as:  EMLA     tiotropium 18 MCG inhalation capsule  Commonly known as:  SPIRIVA      TAKE these medications  ACCU-CHEK AVIVA PLUS W/DEVICE Kit  - Use to check blood sugars twice a day  - Dx 250.00     albuterol 108 (90 BASE) MCG/ACT inhaler  Commonly known as:  PROVENTIL HFA;VENTOLIN HFA  Inhale 2 puffs into the lungs every 6 (six) hours as needed for wheezing or shortness of breath.     AMBULATORY NON FORMULARY MEDICATION  Home O2 @@ 3 LMP for 8 hrs at night     AMBULATORY NON FORMULARY MEDICATION  CPAP     aspirin EC 81 MG tablet  Take 81 mg by mouth daily.     atorvastatin 20 MG tablet  Commonly known as:  LIPITOR  Take 1 tablet (20 mg  total) by mouth daily at 6 PM.     carvedilol 12.5 MG tablet  Commonly known as:  COREG  Take 1 tablet (12.5 mg total) by mouth 2 (two) times daily.     glucose blood test strip  Commonly known as:  ACCU-CHEK AVIVA PLUS  - Use to check blood sugars twice a day  - Dx E11.9     insulin glargine 100 UNIT/ML injection  Commonly known as:  LANTUS  Inject 0.35 mLs (35 Units total) into the skin at bedtime.     insulin lispro 100 UNIT/ML KiwkPen  Commonly known as:  HUMALOG KWIKPEN  Inject 10 units three times a day     Insulin Pen Needle 32G X 4 MM Misc  Commonly known as:  BD PEN NEEDLE NANO U/F  Use as directed 1 per day   250.02     lisinopril 10 MG tablet  Commonly known as:  PRINIVIL,ZESTRIL  Take 1 tablet (10 mg total) by mouth daily.     potassium chloride 10 MEQ tablet  Commonly known as:  K-DUR  Take 10 mEq by mouth 2 (two) times daily.     sitaGLIPtin 50 MG tablet  Commonly known as:  JANUVIA  Take 25 mg by mouth daily.     torsemide 20 MG tablet  Commonly known as:  DEMADEX  Take 2 tablets (40 mg total) by mouth 2 (two) times daily.           Follow-up Information    Follow up with Nahser, Wonda Cheng, MD On 10/01/2014.   Specialty:  Cardiology   Why:  2:00   Contact information:   [address] [place] [address] [phone]      Greater than 30 minutes was spent completing the patient's discharge.   SignedTarri Fuller, [place] 09/16/2014, 2:50 PM

## 2014-09-16 NOTE — Progress Notes (Addendum)
Subjective: Reports no breathing difficulty  Objective: Vital signs in last 24 hours: Temp:  [97.4 F (36.3 C)-99 F (37.2 C)] 97.4 F (36.3 C) (03/01 0418) Pulse Rate:  [71-86] 71 (03/01 0418) Resp:  [18-22] 18 (03/01 0418) BP: (105-140)/(50-89) 115/57 mmHg (03/01 0418) SpO2:  [95 %-96 %] 96 % (03/01 0418) Weight:  [305 lb 4.8 oz (138.483 kg)] 305 lb 4.8 oz (138.483 kg) (03/01 0418) Last BM Date: 09/15/14  Intake/Output from previous day: 02/29 0701 - 03/01 0700 In: 1440 [P.O.:1440] Out: 2275 [Urine:2275] Intake/Output this shift:    Medications Current Facility-Administered Medications  Medication Dose Route Frequency Provider Last Rate Last Dose  . 0.9 %  sodium chloride infusion  250 mL Intravenous PRN Thayer Headings, MD      . acetaminophen (TYLENOL) tablet 650 mg  650 mg Oral Q4H PRN Thayer Headings, MD      . aspirin EC tablet 81 mg  81 mg Oral Daily Thayer Headings, MD   81 mg at 09/15/14 1000  . atorvastatin (LIPITOR) tablet 20 mg  20 mg Oral q1800 Thayer Headings, MD   20 mg at 09/15/14 1743  . carvedilol (COREG) tablet 12.5 mg  12.5 mg Oral BID WC Thayer Headings, MD   12.5 mg at 09/15/14 1743  . enoxaparin (LOVENOX) injection 40 mg  40 mg Subcutaneous Q24H Thayer Headings, MD   40 mg at 09/15/14 1312  . furosemide (LASIX) injection 80 mg  80 mg Intravenous BID Thayer Headings, MD   80 mg at 09/15/14 1743  . insulin aspart (novoLOG) injection 10 Units  10 Units Subcutaneous TID WC Thayer Headings, MD   10 Units at 09/15/14 1744  . insulin glargine (LANTUS) injection 35 Units  35 Units Subcutaneous QHS Thayer Headings, MD   35 Units at 09/15/14 2145  . linagliptin (TRADJENTA) tablet 5 mg  5 mg Oral Daily Thayer Headings, MD   5 mg at 09/15/14 1000  . lisinopril (PRINIVIL,ZESTRIL) tablet 10 mg  10 mg Oral Daily Thayer Headings, MD   10 mg at 09/15/14 1000  . ondansetron (ZOFRAN) injection 4 mg  4 mg Intravenous Q6H PRN Thayer Headings, MD      . potassium  chloride SA (K-DUR,KLOR-CON) CR tablet 20 mEq  20 mEq Oral BID Thayer Headings, MD   20 mEq at 09/15/14 2145  . sodium chloride 0.9 % injection 3 mL  3 mL Intravenous Q12H Thayer Headings, MD   3 mL at 09/15/14 2146  . sodium chloride 0.9 % injection 3 mL  3 mL Intravenous PRN Thayer Headings, MD       Facility-Administered Medications Ordered in Other Encounters  Medication Dose Route Frequency Provider Last Rate Last Dose  . sodium chloride 0.9 % injection 10 mL  10 mL Intravenous PRN [name], MD   10 mL at 04/25/13 1100    PE: General appearance: alert, cooperative, no distress and Patient laying flat with no orthopnea Neck: no JVD Lungs: clear to auscultation bilaterally Heart: regular rate and rhythm and 2/6 sys MM Abdomen: +BS, tense, nontender Extremities: 1+ LEE Pulses: 2+ and symmetric Skin: Warm and dry Neurologic: Grossly normal  Lab Results:  No results for input(s): WBC, HGB, HCT, PLT in the last 72 hours. BMET  Recent Labs  09/14/14 0408 09/15/14 0345  NA 136 135  K 4.6 4.6  CL 102 102  CO2 26 26  GLUCOSE 165* 173*  BUN 44* 47*  CREATININE 1.91* 1.93*  CALCIUM 9.0 8.9    Assessment/Plan Acute on chronic CHF (congestive heart failure), NYHA class III Net fluids: -0.5L/-5.5L. Echo: EF moderately depressed ~40-45%. akinesis of the base/mid anterior wall; basal inferior wall, basal posteriorwall. Moderate to severe AS, Peak and mean gradients-54 and 35 mm Hg. EF in Sept 55-60% with G1DD.  SCr trending up.  Transition to PO meds. CXR, 09/15/14:  Underlying chronic interstitial coarsening with vascular congestion. No edema or focal airspace consolidation.   The patient was laying flat in bed for about 15 minutes while we talked.  No orthopnea.  Will change to PO torsemide 20mg  BID.   Consider DC home today or tomorrow.       Mod to severe AS  Continue to follow    Coronary artery disease involving coronary bypass graft of native heart without  angina pectoris No complaints of angina  Obstructive sleep apnea Uses CPAP  Essential hypertension Been a little soft.   AS, moderate  DM 2  Stable  Non Hodgekins Lymph  Morbid obesity/deconditioning:   I told him what four other doctors did, he needs to loose weight and exercise.     LOS: 4 days    HAGER, BRYAN PA-C 09/16/2014 7:42 AM As above, patient seen and examined. He denies dyspnea or chest pain. Trace to 1+ lower extremity edema. Patient requests discharge stating he feels much better. We will plan to discharge today on Demadex 40 mg daily. Check potassium and renal function on March 3. Follow-up with Dr. Cathie Olden in 1-2 weeks Given severity of aortic stenosis I wonder if this may be contributing to his congestive heart failure. May need to consider AVR in the future. > 30 min PA and physician time D2 Kirk Ruths

## 2014-09-16 NOTE — Progress Notes (Signed)
DC IV, DC Tele, DC Home. Discharge instructions and home medications discussed with patient. Patient denied any questions or concerns at this time. Patient leaving unit via wheelchair with O2 and appears in no acute distress.  

## 2014-09-16 NOTE — Progress Notes (Signed)
Pt states he wants to go home and that lasix is not working for him. Pt educated on plan of care with diuresis and questions answered at this time. Pt encouraged to express concerns to MD. Pt educated on importance of weighing daily at home, calling MD prior to changing medication regimen, and fluid restriction. Pt states he sits at home watching TV most of the day. Pt educated on importance of increasing exercise daily.

## 2014-09-16 NOTE — Care Management Note (Signed)
CARE MANAGEMENT NOTE 09/16/2014  Patient:  [name] [name], [name] [name]   Account Number:  [id number]  Date Initiated:  09/16/2014  Documentation initiated by:  Elizardo Chilson  Subjective/Objective Assessment:   CM following for progression and d/c planning.     Action/Plan:   No d/c needs identified or ordered by MD.   Anticipated DC Date:  09/16/2014   Anticipated DC Plan:  HOME/SELF CARE         Choice offered to / List presented to:             Status of service:  Completed, signed off Medicare Important Message given?  YES (If response is "NO", the following Medicare IM given date fields will be blank) Date Medicare IM given:  09/16/2014 Medicare IM given by:  Cordell Guercio Date Additional Medicare IM given:   Additional Medicare IM given by:    Discharge Disposition:  HOME/SELF CARE  Per UR Regulation:    If discussed at Long Length of Stay Meetings, dates discussed:    Comments:

## 2014-09-16 NOTE — Discharge Instructions (Signed)
Labs:  March 3, Thurdays.  Go to office to have labs drawn.

## 2014-09-29 ENCOUNTER — Encounter: Payer: Commercial Managed Care - HMO | Attending: Endocrinology | Admitting: Nutrition

## 2014-09-29 ENCOUNTER — Encounter: Payer: Commercial Managed Care - HMO | Admitting: Nutrition

## 2014-09-29 DIAGNOSIS — Z794 Long term (current) use of insulin: Secondary | ICD-10-CM | POA: Insufficient documentation

## 2014-09-29 DIAGNOSIS — E118 Type 2 diabetes mellitus with unspecified complications: Secondary | ICD-10-CM | POA: Diagnosis present

## 2014-09-29 DIAGNOSIS — Z713 Dietary counseling and surveillance: Secondary | ICD-10-CM | POA: Insufficient documentation

## 2014-10-01 ENCOUNTER — Encounter: Payer: Self-pay | Admitting: Cardiovascular Disease

## 2014-10-01 ENCOUNTER — Ambulatory Visit (INDEPENDENT_AMBULATORY_CARE_PROVIDER_SITE_OTHER): Payer: Commercial Managed Care - HMO | Admitting: Cardiovascular Disease

## 2014-10-01 VITALS — BP 118/68 | HR 74 | Ht 75.0 in | Wt 313.4 lb

## 2014-10-01 DIAGNOSIS — I2510 Atherosclerotic heart disease of native coronary artery without angina pectoris: Secondary | ICD-10-CM

## 2014-10-01 DIAGNOSIS — I5043 Acute on chronic combined systolic (congestive) and diastolic (congestive) heart failure: Secondary | ICD-10-CM

## 2014-10-01 MED ORDER — METOLAZONE 5 MG PO TABS: ORAL_TABLET | ORAL | Status: DC

## 2014-10-01 NOTE — Progress Notes (Signed)
Pt. Reports that he is only taking 30u of lantus q AM, and does not remember being told to increase this dose. He is also taking Humalog 15u ac meals.  FBSs for last 4 days: 163, 227, 240, 271.   One reading before supper: 227.    Discussed how the Lantus works and the need to increase the dose to bring down the high FBSs.  He reported good understanding of this. He promised to increase this dose to 35u tomorrow.  i will call him in one week to see if this is bringing his blood sugars down.    We discussed how the Humalog works and the need to adjust the dose based on current blood sugar and what he is eating.  He reported good understanding of this, and, once the FBS comes down, will look at this dose more closely to see how it is covering his meals.    He had no final questions.

## 2014-10-01 NOTE — Patient Instructions (Signed)
Take 35u of lantus every morning. Test blood sugars every morning.   Take 15u of humalog 5-10 min. before meals.

## 2014-10-01 NOTE — Patient Instructions (Addendum)
Your physician has recommended you make the following change in your medication:  START Metolazone 5 mg - take 30 minutes before you take Demadex on [place] physician recommends that you return for lab work in: 2-3 weeks for basic metabolic panel  You have been referred to Heart Failure Clinic at [facility] - 1st floor Heart & Vascular Entrance Monday, March 28 at [place] recommends that you schedule a follow-up appointment in: 4 months with Dr. Acie Fredrickson

## 2014-10-01 NOTE — Progress Notes (Signed)
Cardiology Office Note   Date:  10/01/2014   ID:  [name] [name], DOB [birth year], MRN [id number]  PCP:  Oliver Barre, MD  Cardiologist:   Elease Hashimoto Deloris Ping, MD   Chief Complaint  Patient presents with  . Shortness of Breath   Problem List: 1. Coronary artery disease 2. Ischemic cardiopathy with an ejection fraction of around 35% 3. Aortic stenosis - moderate 4. Non hodgekins lymphoma 5. Type 2 diabetes mellitus 6. Sleep apnea   History of Present Illness: [name] [name] is a 69 y.o. male who presents for follow up of CHF.   Breathing is ok.  Has gained weight.   He has been eating too many donuts.  A new shop opened up near his house. [place] Kitchen  He is not having any worsening dyspnea.  No cough. Has gained weight - about 40 lbs in the past 6 months   Feb. 26, 2016:  [name] [name] presents today - very short of breath.  Has gained 8 lbs since I last seen him several weeks ago.  No CP ,  Severe lack of engegy  Has been feeling this poorly for the past 2 weeks. He has continued to gain weight.  October 01, 2014:  [name] [name] was admitted to the hospital at his last office visit.  He was diuresed ~ 5 kg and felt better.    Echo shows  LV of about 40% ( very difficult images) Left ventricle: Poor acoustic windows limit study, even with contrast. Ovreall LVEF is mderately depressed with akinesis of the base/mid anterior wall; basal inferior wall, basal posterior wall. Compared to prior echo (again difficult) LVEF appears worse. The cavity size was normal. Wall thickness was normal. - Aortic valve: AV is thickened, calcified with restricted motion. Peak and mean gradients through the valve are 54 and 35 mm Hg respectively consistent with moderate to severe AS. There was trivial regurgitation. - Left atrium: The atrium was moderately dilated.  Since that time he has continued to have shortness of breath. Tried to do some work outs but he gets too short of breath.   His  edema is better.  He has has PND.  Does not require any extra pillows to sleep. Has severe lack of energy No chest pain    Past Medical History  Diagnosis Date  . Ischemic cardiomyopathy   . Hypertension   . Hypercholesterolemia   . Lower GI bleeding 1980's    "when I was drinking alot" (06/06/2012)  . Chronic combined systolic and diastolic CHF (congestive heart failure) 07/10/2012    a. EF 35-40%, + diastolic dysfunction by echo 12/2012.  Marland Kitchen GERD (gastroesophageal reflux disease) 07/12/2012  . Coronary artery disease     a. Cath 05/2012 with CTO of RCA; s/p BMS to severe OM-1 stenosis.  Marland Kitchen History of alcoholism     "haven't had a drink in 23 yr" (09/12/2014)  . Moderate aortic stenosis     a. Mod by echo 12/2012.  Marland Kitchen Pancreatitis   . Diverticulosis   . Personal history of rectal adenoma 05/28/2013  . Dietary noncompliance   . Morbid obesity   . Lymphoma     a. 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  . Shortness of breath   . Myocardial infarction 11/13  . Pneumonia 05/2012    "right after stent was put in"  . OSA on CPAP   . On home oxygen therapy     "3L; pretty much  last 1 1/2 weeks" (09/12/2014)  . Type II diabetes mellitus   . History of stomach ulcers     "when I drank alot; not bothered w/them since I quit drinking"  . Arthritis     "both shoulders" (09/12/2014)    Past Surgical History  Procedure Laterality Date  . Coronary angioplasty with stent placement  06/06/2012    "1"  . Lymph gland excision  07/13/2012    Procedure: CERVICAL LYMPH GLAND EXCISION;  Surgeon: Haywood Lasso, MD;  Location: [facility];  Service: General;  Laterality: Right;  . Portacath placement  07/30/2012    Procedure: INSERTION PORT-A-CATH;  Surgeon: Haywood Lasso, MD;  Location: [facility];  Service: General;  Laterality: Right;  . Colonoscopy N/A 05/28/2013    Procedure: COLONOSCOPY;   Surgeon: Gatha Mayer, MD;  Location: [facility];  Service: Endoscopy;  Laterality: N/A;  . Left and right heart catheterization with coronary angiogram N/A 06/06/2012    Procedure: LEFT AND RIGHT HEART CATHETERIZATION WITH CORONARY ANGIOGRAM;  Surgeon: Pixie Casino, MD;  Location: [facility] CATH LAB;  Service: Cardiovascular;  Laterality: N/A;  . Percutaneous coronary stent intervention (pci-s)  06/06/2012    Procedure: PERCUTANEOUS CORONARY STENT INTERVENTION (PCI-S);  Surgeon: Troy Sine, MD;  Location: [facility] CATH LAB;  Service: Cardiovascular;;     Current Outpatient Prescriptions  Medication Sig Dispense Refill  . albuterol (PROVENTIL HFA;VENTOLIN HFA) 108 (90 BASE) MCG/ACT inhaler Inhale 2 puffs into the lungs every 6 (six) hours as needed for wheezing or shortness of breath. 1 Inhaler 11  . AMBULATORY NON FORMULARY MEDICATION Home O2 @@ 3 LMP for 8 hrs at night    . AMBULATORY NON FORMULARY MEDICATION CPAP    . aspirin EC 81 MG tablet Take 81 mg by mouth daily.    Marland Kitchen atorvastatin (LIPITOR) 20 MG tablet Take 1 tablet (20 mg total) by mouth daily at 6 PM. 90 tablet 3  . Blood Glucose Monitoring Suppl (ACCU-CHEK AVIVA PLUS) W/DEVICE KIT Use to check blood sugars twice a day Dx 250.00 1 kit 0  . carvedilol (COREG) 12.5 MG tablet Take 1 tablet (12.5 mg total) by mouth 2 (two) times daily. 180 tablet 3  . glucose blood (ACCU-CHEK AVIVA PLUS) test strip Use to check blood sugars twice a day Dx E11.9 60 each 3  . insulin glargine (LANTUS) 100 UNIT/ML injection Inject 0.35 mLs (35 Units total) into the skin at bedtime. (Patient taking differently: Inject 30 Units into the skin daily. ) 10 mL 11  . insulin lispro (HUMALOG KWIKPEN) 100 UNIT/ML KiwkPen Inject 10 units three times a day (Patient taking differently: 10-15 Units 3 (three) times daily. If cbg is <150 take 10 units, if >150 take 15) 15 mL 3  . Insulin Pen Needle (BD PEN NEEDLE NANO U/F) 32G X 4 MM MISC Use as directed 1 per day   250.02  100 each 11  . lisinopril (PRINIVIL,ZESTRIL) 10 MG tablet Take 1 tablet (10 mg total) by mouth daily. 30 tablet 11  . potassium chloride (K-DUR) 10 MEQ tablet Take 10 mEq by mouth 2 (two) times daily.    . sitaGLIPtin (JANUVIA) 50 MG tablet Take 25 mg by mouth daily.     Marland Kitchen torsemide (DEMADEX) 20 MG tablet Take 2 tablets (40 mg total) by mouth 2 (two) times daily. 60 tablet 6   No current facility-administered medications for this visit.   Facility-Administered Medications Ordered in Other Visits  Medication Dose Route  Frequency Provider Last Rate Last Dose  . sodium chloride 0.9 % injection 10 mL  10 mL Intravenous PRN [name], MD   10 mL at 04/25/13 1100    Allergies:   Other    Social History:  The patient  reports that he quit smoking about 14 months ago. His smoking use included Cigarettes. He has a 106 pack-year smoking history. He has never used smokeless tobacco. He reports that he drinks alcohol. He reports that he does not use illicit drugs.   Family History:  The patient's family history includes Colon cancer in his daughter; Diabetes in his father; Heart disease in his father; Osteoarthritis in his father and mother.    ROS:  Please see the history of present illness.    Review of Systems: Constitutional:  denies fever, chills, diaphoresis, he has a very poor appetite  and fatigue.  HEENT: denies photophobia, eye pain, redness, hearing loss, ear pain, congestion, sore throat, rhinorrhea, sneezing, neck pain, neck stiffness and tinnitus.  Respiratory: admits to SOB, DOE, cough, chest tightness, and wheezing.  Sleeps on several pillows.   Cardiovascular: denies chest pain, palpitations .  Has had  Progressive eg swelling.  Gastrointestinal: denies nausea, vomiting, abdominal pain, diarrhea, constipation, blood in stool.  Genitourinary: denies dysuria, urgency, frequency, hematuria, flank pain and difficulty urinating.  Musculoskeletal: denies  myalgias, back pain, joint  swelling, arthralgias and gait problem.   Skin: denies pallor, rash and wound.  Neurological: denies dizziness, seizures, syncope, weakness, light-headedness, numbness and headaches.   Hematological: denies adenopathy, easy bruising, personal or family bleeding history.  Psychiatric/ Behavioral: denies suicidal ideation, mood changes, confusion, nervousness, sleep disturbance and agitation.       All other systems are reviewed and negative.    PHYSICAL EXAM: VS:  BP 118/68 mmHg  Pulse 74  Ht $R'6\' 3"'rq$  (1.905 m)  Wt 313 lb 6.4 oz (142.157 kg)  BMI 39.17 kg/m2  SpO2 96% , BMI Body mass index is 39.17 kg/(m^2). GEN: Well nourished, well developed, in no acute distress HEENT: normal Neck: elevated  JVD, carotid bruits, or masses,  He has a port on the right side of his chest  Cardiac: RRR; 2/6 systolic murmur,  Trace  pitting edema , chronic stasis changes  Respiratory:  Bilateral wheezes, few rales.  Increase work of breathing GI: abdomen is very tense MS: no deformity or atrophy Skin: warm and dry, no rash Neuro:  Strength and sensation are intact Psych: normal   EKG:  EKG is ordered today. NSR at 74.  Occasional PVCs,  ST /T wave abn.    Recent Labs: 06/25/2014: Pro B Natriuretic peptide (BNP) 102.0* 09/12/2014: ALT 15; B Natriuretic Peptide 248.9*; Hemoglobin 10.6*; Magnesium 2.3; Platelets 196; TSH 0.487 09/15/2014: BUN 47*; Creatinine 1.93*; Potassium 4.6; Sodium 135    Lipid Panel    Component Value Date/Time   CHOL 141 05/19/2014 0844   TRIG 149.0 05/19/2014 0844   HDL 31.50* 05/19/2014 0844   CHOLHDL 4 05/19/2014 0844   VLDL 29.8 05/19/2014 0844   LDLCALC 80 05/19/2014 0844      Wt Readings from Last 3 Encounters:  10/01/14 313 lb 6.4 oz (142.157 kg)  09/16/14 305 lb 4.8 oz (138.483 kg)  09/12/14 315 lb 12.8 oz (143.246 kg)      Other studies Reviewed: Additional studies/ records that were reviewed today include: . Review of the above records demonstrates:      ASSESSMENT AND PLAN:  1. Coronary artery disease -  no recent angina. 2. Acute on chronic combined systolic and diastolic congestive heart failure: [name] [name] presents today with decompensated heart failure. He's gained back almost all of his weight before his recent hospitalization. His creatinine is up around 2. He's not responding to the Demadex.  We'll start him on metolazone 5 mg to be taken 30 minutes prior to his a.m. Demadex. We'll give him the metolazone on Wednesdays and Saturdays.  He has moderate pulmonary hypertension. At this point I think we should refer him to the heart failure clinic. He needs much closer follow-up than I'm able to provide.  3. Aortic stenosis - moderate - continue medications. He'll need additional torsemide  4. Non hodgekins lymphoma 5. Type 2 diabetes mellitus- he needs to stick to a better  diet. 6. Sleep apnea   Current medicines are reviewed at length with the patient today.  The patient does not have concerns regarding medicines.  The following changes have been made:  Adding metolazone 5 mg twice a week.     Disposition:   FU with me in 3  months.     Signed, Jonmichael Beadnell, Wonda Cheng, MD  10/01/2014 2:42 PM    [facility] [address], [place], [place]  [address] Phone: [phone]; Fax: [phone]

## 2014-10-02 ENCOUNTER — Ambulatory Visit: Payer: Commercial Managed Care - HMO | Admitting: Endocrinology

## 2014-10-03 ENCOUNTER — Ambulatory Visit (INDEPENDENT_AMBULATORY_CARE_PROVIDER_SITE_OTHER): Payer: Commercial Managed Care - HMO | Admitting: Endocrinology

## 2014-10-03 ENCOUNTER — Encounter: Payer: Self-pay | Admitting: Endocrinology

## 2014-10-03 VITALS — BP 128/80 | HR 88 | Temp 97.9°F | Ht 75.0 in | Wt 313.0 lb

## 2014-10-03 DIAGNOSIS — E1165 Type 2 diabetes mellitus with hyperglycemia: Secondary | ICD-10-CM

## 2014-10-03 DIAGNOSIS — IMO0002 Reserved for concepts with insufficient information to code with codable children: Secondary | ICD-10-CM

## 2014-10-03 LAB — GLUCOSE, POCT (MANUAL RESULT ENTRY): POC Glucose: 119 mg/dl — AB (ref 70–99)

## 2014-10-03 NOTE — Patient Instructions (Addendum)
Keep Lantus at 36  Please check blood sugars at least half the time about 2 hours after any meal and 3 times per week on waking up. Please bring blood sugar monitor to each visit. Recommended blood sugar levels about 2 hours after meal is 140-180 and on waking up 90-130  If sugar 2 hrs after breakfast is over 200 go up to 18 on Humalog

## 2014-10-03 NOTE — Progress Notes (Signed)
Patient ID: [name] [name], male   DOB: [birth year], 69 y.o.   MRN: [id number]            Reason for Appointment: F/u for Type 2 Diabetes  Referring physician:  Cathlean Cower  History of Present Illness:          Diagnosis: Type 2 diabetes mellitus, date of diagnosis: 2005       Past history:    In the past  He has had treatment with metformin, glipizide and Januvia   Metformin was stopped when he had worsening renal function  Overall he has had been consistently controlled diabetes.  Lowest A1c has been 7.8,  This was in 2013 In 7/15 his glucose was 538 with his regimen of oral agents and he was started on Lantus insulin  Recent history: On his initial consultation in 11/15 his Lantus dose was increased and he was started on 10 units of Humalog to cover his meals He was told to take Lantus 36 units daily on his last visit but he forgot and he had been taking only 30 units until he was seen by nurse educator who reminded him on the correct dosage Although his A1c was relatively better at 8.7 in 2/16 his blood sugars are still relatively poorly controlled He has not checked his blood sugars regularly especially after meals and did not bring his monitor for download His glucose was 119 today fasting in the office He has difficulty losing weight He is still not very compliant with his diet, does not always restrict his snacking at night  Oral hypoglycemic drugs the patient is taking are:  Januvia 50 mg       Side effects from medications have been: none INSULIN regimen is described as:  36 Lantus daily, Humalog 15 units before meals   Compliance with the medical regimen: fair  Hypoglycemia:  none   Glucose monitoring:  done infrequently        Glucometer: Accucheck     Blood Glucose readings by recall fasting a few days ago 163, 227, 240, 271. One reading before supper: 227    Self-care: The diet that the patient has been following is: tries to limit .     Meals: 2 meals per day.  Breakfast is usually bacon and eggs and grits.  He is eating more salads and occasionally will make a meal of this.   Otherwise will have fresh or fruit as well as cashew nuts at meals or snacks           Exercise: unable to do much         Dietician visit, most recent: never, CDE visit 09/29/14              Weight history:  Previous range:250-325  Wt Readings from Last 3 Encounters:  10/03/14 313 lb (141.976 kg)  10/01/14 313 lb 6.4 oz (142.157 kg)  09/16/14 305 lb 4.8 oz (138.483 kg)    Glycemic control:   Lab Results  Component Value Date   HGBA1C 8.7* 08/27/2014   HGBA1C 12.6* 06/06/2014   HGBA1C 18.5* 02/04/2014   Lab Results  Component Value Date   MICROALBUR 0.3 02/04/2014   LDLCALC 80 05/19/2014   CREATININE 1.93* 09/15/2014          Medication List       This list is accurate as of: 10/03/14 11:59 PM.  Always use your most recent med list.  ACCU-CHEK AVIVA PLUS W/DEVICE Kit  - Use to check blood sugars twice a day  - Dx 250.00     albuterol 108 (90 BASE) MCG/ACT inhaler  Commonly known as:  PROVENTIL HFA;VENTOLIN HFA  Inhale 2 puffs into the lungs every 6 (six) hours as needed for wheezing or shortness of breath.     AMBULATORY NON FORMULARY MEDICATION  Home O2 @@ 3 LMP for 8 hrs at night     AMBULATORY NON FORMULARY MEDICATION  CPAP     aspirin EC 81 MG tablet  Take 81 mg by mouth daily.     atorvastatin 20 MG tablet  Commonly known as:  LIPITOR  Take 1 tablet (20 mg total) by mouth daily at 6 PM.     carvedilol 12.5 MG tablet  Commonly known as:  COREG  Take 1 tablet (12.5 mg total) by mouth 2 (two) times daily.     glucose blood test strip  Commonly known as:  ACCU-CHEK AVIVA PLUS  - Use to check blood sugars twice a day  - Dx E11.9     insulin glargine 100 UNIT/ML injection  Commonly known as:  LANTUS  Inject 0.35 mLs (35 Units total) into the skin at bedtime.     insulin lispro 100 UNIT/ML KiwkPen  Commonly known  as:  HUMALOG KWIKPEN  Inject 10 units three times a day     Insulin Pen Needle 32G X 4 MM Misc  Commonly known as:  BD PEN NEEDLE NANO U/F  Use as directed 1 per day   250.02     lisinopril 10 MG tablet  Commonly known as:  PRINIVIL,ZESTRIL  Take 1 tablet (10 mg total) by mouth daily.     metolazone 5 MG tablet  Commonly known as:  ZAROXOLYN  Take 1 pill 30  Minutes before your Demadex on Wednesdays and Saturdays ONLY     potassium chloride 10 MEQ tablet  Commonly known as:  K-DUR  Take 10 mEq by mouth 2 (two) times daily.     sitaGLIPtin 50 MG tablet  Commonly known as:  JANUVIA  Take 25 mg by mouth daily.     torsemide 20 MG tablet  Commonly known as:  DEMADEX  Take 2 tablets (40 mg total) by mouth 2 (two) times daily.        Allergies:  Allergies  Allergen Reactions  . Other Hives    Poison ivy.     Past Medical History  Diagnosis Date  . Ischemic cardiomyopathy   . Hypertension   . Hypercholesterolemia   . Lower GI bleeding 1980's    "when I was drinking alot" (06/06/2012)  . Chronic combined systolic and diastolic CHF (congestive heart failure) 07/10/2012    a. EF 95-18%, + diastolic dysfunction by echo 12/2012.  Marland Kitchen GERD (gastroesophageal reflux disease) 07/12/2012  . Coronary artery disease     a. Cath 05/2012 with CTO of RCA; s/p BMS to severe OM-1 stenosis.  Marland Kitchen History of alcoholism     "haven't had a drink in 23 yr" (09/12/2014)  . Moderate aortic stenosis     a. Mod by echo 12/2012.  Marland Kitchen Pancreatitis   . Diverticulosis   . Personal history of rectal adenoma 05/28/2013  . Dietary noncompliance   . Morbid obesity   . Lymphoma     a. 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  . Shortness of breath   . Myocardial infarction 11/13  .  Pneumonia 05/2012    "right after stent was put in"  . OSA on CPAP   . On home oxygen therapy     "3L; pretty much last 1 1/2 weeks" (09/12/2014)    . Type II diabetes mellitus   . History of stomach ulcers     "when I drank alot; not bothered w/them since I quit drinking"  . Arthritis     "both shoulders" (09/12/2014)    Past Surgical History  Procedure Laterality Date  . Coronary angioplasty with stent placement  06/06/2012    "1"  . Lymph gland excision  07/13/2012    Procedure: CERVICAL LYMPH GLAND EXCISION;  Surgeon: Haywood Lasso, MD;  Location: [place];  Service: General;  Laterality: Right;  . Portacath placement  07/30/2012    Procedure: INSERTION PORT-A-CATH;  Surgeon: Haywood Lasso, MD;  Location: [place];  Service: General;  Laterality: Right;  . Colonoscopy N/A 05/28/2013    Procedure: COLONOSCOPY;  Surgeon: Gatha Mayer, MD;  Location: [facility];  Service: Endoscopy;  Laterality: N/A;  . Left and right heart catheterization with coronary angiogram N/A 06/06/2012    Procedure: LEFT AND RIGHT HEART CATHETERIZATION WITH CORONARY ANGIOGRAM;  Surgeon: Pixie Casino, MD;  Location: [facility] CATH LAB;  Service: Cardiovascular;  Laterality: N/A;  . Percutaneous coronary stent intervention (pci-s)  06/06/2012    Procedure: PERCUTANEOUS CORONARY STENT INTERVENTION (PCI-S);  Surgeon: Troy Sine, MD;  Location: [facility] C[facility] LAB;  Service: Cardiovascular;;    Family History  Problem Relation Age of Onset  . Osteoarthritis Mother   . Osteoarthritis Father   . Colon cancer Daughter   . Diabetes Father     entire family  . Heart disease Father     Social History:  reports that he quit smoking about 14 months ago. His smoking use included Cigarettes. He has a 106 pack-year smoking history. He has never used smokeless tobacco. He reports that he drinks alcohol. He reports that he does not use illicit drugs.    Review of Systems       Vision is normal. Most recent eye exam was in 6/15       Lipids: On Lipitor  20 mg daily       Lab Results  Component Value Date   CHOL 141 05/19/2014   HDL 31.50*  05/19/2014   LDLCALC 80 05/19/2014   TRIG 149.0 05/19/2014   CHOLHDL 4 05/19/2014                   Thyroid:   Gets fatigue  Relatively easily, no history of thyroid disease.  Lab Results  Component Value Date   TSH 0.487 09/12/2014       The blood pressure has been treated with  Carvedilol and lisinopril and appears controlled  Renal dysfunction: Somewhat better with reducing diuretics   LABS:  Office Visit on 10/03/2014  Component Date Value Ref Range Status  . POC Glucose 10/03/2014 119* 70 - 99 mg/dl Final    Physical Examination:  BP 128/80 mmHg  Pulse 88  Temp(Src) 97.9 F (36.6 C) (Oral)  Ht $R'6\' 3"'or$  (1.905 m)  Wt 313 lb (141.976 kg)  BMI 39.12 kg/m2  SpO2 98%     ASSESSMENT:  Diabetes type 2, uncontrolled    He does have significant obesity with BMI 37 Currently his blood sugars are difficult to assess as he has not been checking much except in the morning Since his blood  sugars were higher fasting his dose was increased on the last visit but he has not done this until this week after discussion with nurse educator and blood sugar appears better this morning Previously was still having mostly readings over 200 at breakfast and once at supper He may not be getting enough mealtime insulin also but difficult to adjust since he has not done any postprandial readings as discussed on each visit   PLAN:   Start monitoring blood sugars as directed and bring glucose monitor for download on the next visit  Continue 36 Lantus for now  May need 18 units of Humalog especially at his main meal if postprandial readings over 180, instructions given  Consultation with dietitian for detailed meal planning  He will need follow-up A1c on the next visit   Patient Instructions  Keep Lantus at 36  Please check blood sugars at least half the time about 2 hours after any meal and 3 times per week on waking up. Please bring blood sugar monitor to each visit. Recommended  blood sugar levels about 2 hours after meal is 140-180 and on waking up 90-130  If sugar 2 hrs after breakfast is over 200 go up to 18 on Humalog        Weyman Bogdon 10/05/2014, 9:35 PM   Note: This office note was prepared with Estate agent. Any transcriptional errors that result from this process are unintentional.

## 2014-10-13 ENCOUNTER — Ambulatory Visit (HOSPITAL_COMMUNITY)
Admission: RE | Admit: 2014-10-13 | Discharge: 2014-10-13 | Disposition: A | Discharge status: Home / Self Care | Payer: Commercial Managed Care - HMO | Source: Ambulatory Visit | Attending: Internal Medicine | Admitting: Internal Medicine

## 2014-10-13 ENCOUNTER — Encounter (HOSPITAL_COMMUNITY): Payer: Self-pay

## 2014-10-13 ENCOUNTER — Telehealth (HOSPITAL_COMMUNITY): Payer: Self-pay | Admitting: Cardiology

## 2014-10-13 VITALS — BP 106/54 | HR 81 | Ht 75.0 in | Wt 304.8 lb

## 2014-10-13 DIAGNOSIS — Z79899 Other long term (current) drug therapy: Secondary | ICD-10-CM | POA: Insufficient documentation

## 2014-10-13 DIAGNOSIS — K219 Gastro-esophageal reflux disease without esophagitis: Secondary | ICD-10-CM | POA: Insufficient documentation

## 2014-10-13 DIAGNOSIS — Z9119 Patient's noncompliance with other medical treatment and regimen: Secondary | ICD-10-CM | POA: Diagnosis not present

## 2014-10-13 DIAGNOSIS — N189 Chronic kidney disease, unspecified: Secondary | ICD-10-CM | POA: Diagnosis not present

## 2014-10-13 DIAGNOSIS — N183 Chronic kidney disease, stage 3 unspecified: Secondary | ICD-10-CM

## 2014-10-13 DIAGNOSIS — I25810 Atherosclerosis of coronary artery bypass graft(s) without angina pectoris: Secondary | ICD-10-CM | POA: Diagnosis not present

## 2014-10-13 DIAGNOSIS — I350 Nonrheumatic aortic (valve) stenosis: Secondary | ICD-10-CM

## 2014-10-13 DIAGNOSIS — I2510 Atherosclerotic heart disease of native coronary artery without angina pectoris: Secondary | ICD-10-CM | POA: Diagnosis not present

## 2014-10-13 DIAGNOSIS — R0600 Dyspnea, unspecified: Secondary | ICD-10-CM | POA: Insufficient documentation

## 2014-10-13 DIAGNOSIS — R0609 Other forms of dyspnea: Secondary | ICD-10-CM

## 2014-10-13 DIAGNOSIS — J449 Chronic obstructive pulmonary disease, unspecified: Secondary | ICD-10-CM | POA: Insufficient documentation

## 2014-10-13 DIAGNOSIS — I129 Hypertensive chronic kidney disease with stage 1 through stage 4 chronic kidney disease, or unspecified chronic kidney disease: Secondary | ICD-10-CM | POA: Diagnosis not present

## 2014-10-13 DIAGNOSIS — C9111 Chronic lymphocytic leukemia of B-cell type in remission: Secondary | ICD-10-CM | POA: Diagnosis not present

## 2014-10-13 DIAGNOSIS — I5042 Chronic combined systolic (congestive) and diastolic (congestive) heart failure: Secondary | ICD-10-CM | POA: Insufficient documentation

## 2014-10-13 DIAGNOSIS — I5043 Acute on chronic combined systolic (congestive) and diastolic (congestive) heart failure: Secondary | ICD-10-CM

## 2014-10-13 DIAGNOSIS — J9611 Chronic respiratory failure with hypoxia: Secondary | ICD-10-CM | POA: Diagnosis not present

## 2014-10-13 DIAGNOSIS — Z794 Long term (current) use of insulin: Secondary | ICD-10-CM | POA: Insufficient documentation

## 2014-10-13 DIAGNOSIS — I252 Old myocardial infarction: Secondary | ICD-10-CM | POA: Insufficient documentation

## 2014-10-13 DIAGNOSIS — Z87891 Personal history of nicotine dependence: Secondary | ICD-10-CM | POA: Insufficient documentation

## 2014-10-13 DIAGNOSIS — E119 Type 2 diabetes mellitus without complications: Secondary | ICD-10-CM | POA: Diagnosis not present

## 2014-10-13 DIAGNOSIS — Z7982 Long term (current) use of aspirin: Secondary | ICD-10-CM | POA: Insufficient documentation

## 2014-10-13 DIAGNOSIS — G4733 Obstructive sleep apnea (adult) (pediatric): Secondary | ICD-10-CM | POA: Diagnosis not present

## 2014-10-13 DIAGNOSIS — I255 Ischemic cardiomyopathy: Secondary | ICD-10-CM | POA: Diagnosis not present

## 2014-10-13 DIAGNOSIS — E780 Pure hypercholesterolemia: Secondary | ICD-10-CM | POA: Diagnosis not present

## 2014-10-13 LAB — BASIC METABOLIC PANEL
ANION GAP: 14 (ref 5–15)
BUN: 82 mg/dL — ABNORMAL HIGH (ref 6–23)
CALCIUM: 9.3 mg/dL (ref 8.4–10.5)
CO2: 25 mmol/L (ref 19–32)
CREATININE: 2.68 mg/dL — AB (ref 0.50–1.35)
Chloride: 91 mmol/L — ABNORMAL LOW (ref 96–112)
GFR calc Af Amer: 26 mL/min — ABNORMAL LOW (ref >90)
GFR, EST NON AFRICAN AMERICAN: 23 mL/min — AB (ref >90)
Glucose, Bld: 203 mg/dL — ABNORMAL HIGH (ref 70–99)
Potassium: 4.4 mmol/L (ref 3.5–5.1)
Sodium: 130 mmol/L — ABNORMAL LOW (ref 135–145)

## 2014-10-13 MED ORDER — METOLAZONE 5 MG PO TABS: ORAL_TABLET | ORAL | Status: DC

## 2014-10-13 NOTE — Progress Notes (Signed)
Patient ID: [name] [name], male   DOB: [birth year], 69 y.o.   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 COPD (stopped smoking in 2015), OSA, DM2, CLL in remission who is referred by [name] [name] for further management of his HF.  10/01/14 Echo shows LV of about 40% ( very difficult images) akinesis of the base/mid anterior wall; basal inferior wall, basal posterior wall. Compared to prior echo (again difficult) LVEF appears. - Aortic valve: AV is thickened, calcified with restricted motion. Peak and mean gradients through the valve are 54 and 35 mm Hg  He follows with [name] [name] whose last note says He has mild emphysema by CT chest with some air trapping, but no significant airflow obstruction on PFTs. PFTs not on chart   He apparently likes to eat too much and has gained 40 pounds in last 6 months. He was last admitted 09/12/14 to 3/1. Weight 315 on admit sent home at 305. Saw [name] [name] 2 weeks ago and weight back up. Metolazone added 2x per week (Wednesday and Saturday)   Says he doesn't like the metolazone because it makes him pee too much. Weight down 9 pounds over last 2 weeks. Says it really broke loose last night. Says breathing better but still SOB with minimal exertion. No CP. Weighs himself several times per day.     ROS: All systems negative except as listed in HPI, PMH and Problem List.  SH:  History   Social History  . Marital Status: Divorced    [name]  . Number of Children: 2  . Years of Education: 12   Occupational History  . retired    Social History Main Topics  . Smoking status: Former Smoker -- 2.00 packs/day for 53 years    Types: Cigarettes    Quit date: 07/18/2013  . Smokeless tobacco: Never Used  . Alcohol Use: Yes     Comment:  Hasn't drank since 8527 (former alcoholic)  . Drug Use: No  . Sexual Activity: No   Other Topics Concern  . 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   Lives by self, since '78   [name] in [place]     Regular exercise-no   Caffeine Use-no    FH:  Family History  Problem Relation Age of Onset  . Osteoarthritis Mother   . Osteoarthritis Father   . Colon cancer Daughter   . Diabetes Father     entire family  . Heart disease Father     Past Medical History  Diagnosis Date  . Ischemic cardiomyopathy   . Hypertension   . Hypercholesterolemia   . Lower GI bleeding 1980's    "when I was drinking alot" (06/06/2012)  . Chronic combined systolic and diastolic CHF (congestive heart failure) 07/10/2012    a. EF 14-43%, + diastolic dysfunction by echo 12/2012.  Marland Kitchen GERD (gastroesophageal reflux disease) 07/12/2012  . Coronary artery disease     a. Cath 05/2012 with CTO of RCA; s/p BMS to severe OM-1 stenosis.  Marland Kitchen History of alcoholism     "haven't had a drink in 23 yr" (09/12/2014)  . Moderate aortic stenosis     a. Mod by echo 12/2012.  Marland Kitchen Pancreatitis   .  Diverticulosis   . Personal history of rectal adenoma 05/28/2013  . Dietary noncompliance   . Morbid obesity   . Lymphoma     a. 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  . Shortness of breath   . Myocardial infarction 11/13  . Pneumonia 05/2012    "right after stent was put in"  . OSA on CPAP   . On home oxygen therapy     "3L; pretty much last 1 1/2 weeks" (09/12/2014)  . Type II diabetes mellitus   . History of stomach ulcers     "when I drank alot; not bothered w/them since I quit drinking"  . Arthritis     "both shoulders" (09/12/2014)    Current Outpatient Prescriptions  Medication Sig Dispense Refill  . albuterol (PROVENTIL HFA;VENTOLIN HFA) 108 (90 BASE) MCG/ACT inhaler Inhale 2 puffs into  the lungs every 6 (six) hours as needed for wheezing or shortness of breath. 1 Inhaler 11  . AMBULATORY NON FORMULARY MEDICATION Home O2 @@ 3 LMP for 8 hrs at night    . AMBULATORY NON FORMULARY MEDICATION CPAP    . aspirin EC 81 MG tablet Take 81 mg by mouth daily.    Marland Kitchen atorvastatin (LIPITOR) 20 MG tablet Take 1 tablet (20 mg total) by mouth daily at 6 PM. 90 tablet 3  . Blood Glucose Monitoring Suppl (ACCU-CHEK AVIVA PLUS) W/DEVICE KIT Use to check blood sugars twice a day Dx 250.00 1 kit 0  . carvedilol (COREG) 12.5 MG tablet Take 1 tablet (12.5 mg total) by mouth 2 (two) times daily. 180 tablet 3  . glucose blood (ACCU-CHEK AVIVA PLUS) test strip Use to check blood sugars twice a day Dx E11.9 60 each 3  . insulin glargine (LANTUS) 100 UNIT/ML injection Inject 0.35 mLs (35 Units total) into the skin at bedtime. (Patient taking differently: Inject 30 Units into the skin daily. ) 10 mL 11  . insulin lispro (HUMALOG KWIKPEN) 100 UNIT/ML KiwkPen Inject 10 units three times a day (Patient taking differently: 10-15 Units 3 (three) times daily. If cbg is <150 take 10 units, if >150 take 15) 15 mL 3  . Insulin Pen Needle (BD PEN NEEDLE NANO U/F) 32G X 4 MM MISC Use as directed 1 per day   250.02 100 each 11  . lisinopril (PRINIVIL,ZESTRIL) 10 MG tablet Take 1 tablet (10 mg total) by mouth daily. 30 tablet 11  . metolazone (ZAROXOLYN) 5 MG tablet Take 1 pill 30  Minutes before your Demadex on Wednesdays and Saturdays ONLY 15 tablet 6  . potassium chloride (K-DUR) 10 MEQ tablet Take 10 mEq by mouth 2 (two) times daily.    . sitaGLIPtin (JANUVIA) 50 MG tablet Take 25 mg by mouth daily.     Marland Kitchen torsemide (DEMADEX) 20 MG tablet Take 2 tablets (40 mg total) by mouth 2 (two) times daily. 60 tablet 6   No current facility-administered medications for this encounter.   Facility-Administered Medications Ordered in Other Encounters  Medication Dose Route Frequency Provider Last Rate Last Dose  . sodium chloride  0.9 % injection 10 mL  10 mL Intravenous PRN [name], MD   10 mL at 04/25/13 1100    Filed Vitals:   10/13/14 1045  BP: 106/54  Pulse: 81  Height: $Remove'6\' 3"'ggLsSdm$  (1.905 m)  Weight: 304 lb 12.8 oz (138.256 kg)  SpO2: 97%    PHYSICAL EXAM: Sats 87-91% with hall walk General:  Morbidly obese male  Very SOB on minimal exertion HEENT: normal Neck: supple. Thick hard to see JVP but appears about 7. RIJ port-acath Carotids 2+ bilaterally; bilateral radiated bruits. No lymphadenopathy or thryomegaly appreciated. Cor: PMI normal. Regular rate & rhythm. 3/6 SEM RSB Lungs: clear with markedly decreased BS Abdomen: morbidly obese. oft, nontender, nondistended. No hepatosplenomegaly. No bruits or masses. Good bowel sounds. Extremities: no cyanosis, clubbing, rash, tr edema Neuro: alert & orientedx3, cranial nerves grossly intact. Moves all 4 extremities w/o difficulty. Affect pleasant.  ASSESSMENT & PLAN: 1. Chronic systolic/diastolic HF. EF 40% Echo 3/16 2. CAD    --CTO of RCA. S/p stent to OM 11/13 3. Morbid obesity 4. CKD, stage 3 - baseline cr 1.8-1.9 5. Moderate AS 6. OSA 7. Dyspnea 8. Chronic hypoxic respiratory failure  I suspect his dyspnea is multifactorial due to COPD, diastolic HF, moderate AS, OHS. I did a hall walk with him personally and sats 87-91% off O2.I reinforced he need to wear O2 all the time.   Volume status now much improved with metolazone. We discussed need to use metolazone with 20  KCL when weight 305 or greater. Reinforced need for daily weights and reviewed use of sliding scale diuretics. That said, I told him that he would never really feel better until he lost a considerable amount of weight (at least 50 pounds). I have recommended [employer]. Will refer to pulmonary rehab.   Check labs today.   Return in 3-4 weeks.   Total time spent 45 minutes. Over half that time spent discussing above.   Lucious Zou,MD 11:14 AM

## 2014-10-13 NOTE — Telephone Encounter (Signed)
Opened in error

## 2014-10-13 NOTE — Patient Instructions (Signed)
TAKE Metolazone 1 tablet as needed if weight goes above 305lbs.   TAKE an extra Potassium tablet if you take Metolazone.  You have been referred to Pulmonology.  FOLLOW UP 4 WEEKS.

## 2014-10-17 ENCOUNTER — Telehealth (HOSPITAL_COMMUNITY): Payer: Self-pay

## 2014-10-17 NOTE — Telephone Encounter (Signed)
I have called and left a message with Jensyn to inquire about participation in Pulmonary Rehab per Dr. Clayborne Dana referral. Will send letter in mail and follow up.

## 2014-10-22 ENCOUNTER — Ambulatory Visit (HOSPITAL_BASED_OUTPATIENT_CLINIC_OR_DEPARTMENT_OTHER): Payer: Commercial Managed Care - HMO

## 2014-10-22 ENCOUNTER — Ambulatory Visit (HOSPITAL_COMMUNITY)
Admission: RE | Admit: 2014-10-22 | Discharge: 2014-10-22 | Disposition: A | Discharge status: Home / Self Care | Payer: Commercial Managed Care - HMO | Source: Ambulatory Visit | Attending: Cardiology | Admitting: Cardiology

## 2014-10-22 VITALS — BP 101/51 | HR 77 | Temp 97.8°F | Resp 18

## 2014-10-22 DIAGNOSIS — Z95828 Presence of other vascular implants and grafts: Secondary | ICD-10-CM

## 2014-10-22 DIAGNOSIS — C9110 Chronic lymphocytic leukemia of B-cell type not having achieved remission: Secondary | ICD-10-CM

## 2014-10-22 DIAGNOSIS — I5043 Acute on chronic combined systolic (congestive) and diastolic (congestive) heart failure: Secondary | ICD-10-CM

## 2014-10-22 DIAGNOSIS — Z452 Encounter for adjustment and management of vascular access device: Secondary | ICD-10-CM | POA: Diagnosis not present

## 2014-10-22 DIAGNOSIS — I5022 Chronic systolic (congestive) heart failure: Secondary | ICD-10-CM

## 2014-10-22 LAB — BASIC METABOLIC PANEL
ANION GAP: 10 (ref 5–15)
BUN: 119 mg/dL — ABNORMAL HIGH (ref 6–23)
CHLORIDE: 93 mmol/L — AB (ref 96–112)
CO2: 29 mmol/L (ref 19–32)
Calcium: 9.4 mg/dL (ref 8.4–10.5)
Creatinine, Ser: 3.17 mg/dL — ABNORMAL HIGH (ref 0.50–1.35)
GFR calc non Af Amer: 19 mL/min — ABNORMAL LOW (ref >90)
GFR, EST AFRICAN AMERICAN: 22 mL/min — AB (ref >90)
Glucose, Bld: 195 mg/dL — ABNORMAL HIGH (ref 70–99)
Potassium: 5 mmol/L (ref 3.5–5.1)
Sodium: 132 mmol/L — ABNORMAL LOW (ref 135–145)

## 2014-10-22 MED ORDER — HEPARIN SOD (PORK) LOCK FLUSH 100 UNIT/ML IV SOLN
500.0000 [IU] | Freq: Once | INTRAVENOUS | Status: AC
  Administered 2014-10-22: 500 [IU] via INTRAVENOUS
  Filled 2014-10-22: qty 5

## 2014-10-22 MED ORDER — SODIUM CHLORIDE 0.9 % IJ SOLN
10.0000 mL | INTRAMUSCULAR | Status: DC | PRN
  Administered 2014-10-22: 10 mL via INTRAVENOUS
  Filled 2014-10-22: qty 10

## 2014-10-22 NOTE — Patient Instructions (Signed)

## 2014-10-24 ENCOUNTER — Telehealth (HOSPITAL_COMMUNITY): Payer: Self-pay | Admitting: *Deleted

## 2014-10-24 NOTE — Telephone Encounter (Signed)
-----   Message from Scarlette Calico, RN sent at 10/23/2014  4:53 PM EDT ----- 3/28 Metolazone was suppose to be changed to prn, have attempted to call pt to verify what he has been taking as CR is very elevated, Left message to call back

## 2014-10-24 NOTE — Telephone Encounter (Signed)
Notes Recorded by Scarlette Calico, RN on 10/24/2014 at 4:31 PM Per Dr Haroldine Laws have pt stop KCL, continue other meds at this time if pt is not having any dizziness and recheck labs on Mon, pt aware and agreeable, he denies dizziness and states he is feeling pretty good, he is unsure if he will have transportation on Mon or not if can't come for labs then will come on Tue

## 2014-10-27 ENCOUNTER — Ambulatory Visit: Payer: Commercial Managed Care - HMO | Admitting: Dietician

## 2014-10-28 ENCOUNTER — Ambulatory Visit (HOSPITAL_COMMUNITY)
Admission: RE | Admit: 2014-10-28 | Discharge: 2014-10-28 | Disposition: A | Discharge status: Home / Self Care | Payer: Commercial Managed Care - HMO | Source: Ambulatory Visit | Attending: Internal Medicine | Admitting: Internal Medicine

## 2014-10-28 DIAGNOSIS — I5043 Acute on chronic combined systolic (congestive) and diastolic (congestive) heart failure: Secondary | ICD-10-CM | POA: Diagnosis not present

## 2014-10-28 DIAGNOSIS — I5022 Chronic systolic (congestive) heart failure: Secondary | ICD-10-CM | POA: Diagnosis not present

## 2014-10-28 LAB — BASIC METABOLIC PANEL
Anion gap: 12 (ref 5–15)
BUN: 84 mg/dL — ABNORMAL HIGH (ref 6–23)
CO2: 24 mmol/L (ref 19–32)
CREATININE: 2.42 mg/dL — AB (ref 0.50–1.35)
Calcium: 9.1 mg/dL (ref 8.4–10.5)
Chloride: 98 mmol/L (ref 96–112)
GFR calc Af Amer: 30 mL/min — ABNORMAL LOW (ref >90)
GFR, EST NON AFRICAN AMERICAN: 26 mL/min — AB (ref >90)
GLUCOSE: 184 mg/dL — AB (ref 70–99)
Potassium: 4.7 mmol/L (ref 3.5–5.1)
SODIUM: 134 mmol/L — AB (ref 135–145)

## 2014-10-31 ENCOUNTER — Telehealth (HOSPITAL_COMMUNITY): Payer: Self-pay | Admitting: Vascular Surgery

## 2014-10-31 ENCOUNTER — Encounter (HOSPITAL_COMMUNITY)
Admission: RE | Admit: 2014-10-31 | Discharge: 2014-10-31 | Disposition: A | Discharge status: Home / Self Care | Payer: Commercial Managed Care - HMO | Source: Ambulatory Visit | Attending: Internal Medicine | Admitting: Internal Medicine

## 2014-10-31 ENCOUNTER — Encounter (HOSPITAL_COMMUNITY): Payer: Self-pay

## 2014-10-31 VITALS — BP 80/45 | HR 88 | Resp 22 | Ht 72.0 in | Wt 304.2 lb

## 2014-10-31 DIAGNOSIS — G4730 Sleep apnea, unspecified: Secondary | ICD-10-CM | POA: Insufficient documentation

## 2014-10-31 DIAGNOSIS — I5042 Chronic combined systolic (congestive) and diastolic (congestive) heart failure: Secondary | ICD-10-CM | POA: Diagnosis present

## 2014-10-31 DIAGNOSIS — I509 Heart failure, unspecified: Secondary | ICD-10-CM

## 2014-10-31 NOTE — Telephone Encounter (Addendum)
Spoke w/[name] [name] pt's BP is low in rehab she states 80/45 right arm and 81/49 left arm with automatic cuff, she states she checked it manually and got 64/44, however very faint, pt is totally asymptomatic, will discuss with MD and call her back

## 2014-10-31 NOTE — Telephone Encounter (Signed)
[name] [name] from cardiac and pulm rehab called pt bp is running really low, pt is very tired and sleepy. She is going to keep the pt there until she hears from someone here.. Please advise

## 2014-10-31 NOTE — Progress Notes (Signed)
[name] [name] 69 y.o. male Pulmonary Rehab Orientation Note Patient arrived today in [facility] for orientation to Pulmonary Rehab. He was transported from [employer] via wheel chair. He does not carry portable oxygen on a routine  basis, however he did bring it with him today. Patient states he only wears his oxygen at night with his CPAP. I informed patient that per Dr. Clayborne Dana note, patient is encouraged to wear his oxygen around the clock. Patient agreed but stated he was waiting on "one he could carry". Will follow up on this for patient. Patient presented without major complaints. Did stated he was sleepy and stayed in bed most of the day yesterday because of lethargy. Patients admission vitals: BP 80/45 with automatic cuff in R arm, 81/49 L, and 64/44 in L checked by manual cuff. HR 88, Sats 97 on room air. Patient denied all other symptoms of hypotension. Medications reviewed. Patient verbalized compliance with meds as prescribed. Color good, skin warm and dry. Patient is oriented x 4. Heart rate is normal, S1S2 present. Breath sounds clear to auscultation, no wheezes, rales, or rhonchi. Mild nonpitting edema in lower extremities. Grip strength equal, strong. Patient stated he is still voiding a "normal" amount. Distal pulses palpable but faint. Nira Conn, RN at Dr. Clayborne Dana office notified of patients low BP. Nira Conn stated she would notify MD. Patient states he should be following a Low Sodium, heart failure, diabetic diet, however he had sausage for breakfast, and occasionally has doughnuts. The patient reports no specific efforts to gain or lose weight. Patient's weight will be monitored closely. Demonstration and practice of PLB using pulse oximeter. Patient able to return demonstration satisfactorily. Safety and hand hygiene in the exercise area reviewed with patient. Patient voices understanding of the information reviewed. Department expectations discussed with patient  and achievable goals were set. The patient shows enthusiasm about attending the program and we look forward to working with this nice gentleman. The patient is scheduled for a 6 min walk test on Tuesday 4/19 at 3:30 and to begin exercise on Thursday 4/21 at 10:30. Patient remained asymptomatic from low bp during entire portion of this orientation. Discharge vitals as follows: BP102/55 sitting, 102/57 standing, HR 77, Sats 98 on RA. Patient instructed on emergency symptoms of hypotension and encouraged to seek emergency care if they occurr. Patient discharged home in stable condition and also encouraged to call MD for follow up later today if he has not been contacted.  45 minutes was spent on a variety of activities such as assessment of the patient, obtaining baseline data including height, weight, BMI, and grip strength, verifying medical history, allergies, and current medications, and teaching patient strategies for performing tasks with less respiratory effort with emphasis on pursed lip breathing.

## 2014-10-31 NOTE — Telephone Encounter (Signed)
Discussed w/Dr Bensimhon, he would like pt to decrease Torsemide to 40 mg daily, if wt starts trending up to let us know, have attempted to call pt and left mess to decrease med, call us back to let us know he received mess

## 2014-11-04 ENCOUNTER — Inpatient Hospital Stay (HOSPITAL_COMMUNITY): Admission: RE | Admit: 2014-11-04 | Payer: Commercial Managed Care - HMO | Source: Ambulatory Visit

## 2014-11-06 ENCOUNTER — Encounter (HOSPITAL_COMMUNITY): Payer: Commercial Managed Care - HMO

## 2014-11-06 ENCOUNTER — Encounter (HOSPITAL_COMMUNITY)
Admission: RE | Admit: 2014-11-06 | Discharge: 2014-11-06 | Disposition: A | Discharge status: Home / Self Care | Payer: Commercial Managed Care - HMO | Source: Ambulatory Visit | Attending: Internal Medicine | Admitting: Internal Medicine

## 2014-11-06 DIAGNOSIS — I5042 Chronic combined systolic (congestive) and diastolic (congestive) heart failure: Secondary | ICD-10-CM | POA: Diagnosis not present

## 2014-11-06 NOTE — Progress Notes (Signed)
[name] [name] completed a Six-Minute Walk Test on 11/06/14 . [name] [name] walked 561 feet with 0 breaks.  The patient's lowest oxygen saturation was 90 %, highest heart rate was 115 bpm , and highest blood pressure was 136/75. The patient was on 3 liters of oxygen with a nasal cannula. Patient stated that overall fatigue hindered their walk test.

## 2014-11-07 ENCOUNTER — Telehealth: Payer: Self-pay | Admitting: Internal Medicine

## 2014-11-07 NOTE — Telephone Encounter (Signed)
Patient is requesting script for lantus to be sent to [facility] on [address].  States Dr. Dwyane Dee raised dosage to 40 units.

## 2014-11-08 MED ORDER — INSULIN GLARGINE 100 UNIT/ML ~~LOC~~ SOLN: 40.0000 [IU] | Freq: Every day | SUBCUTANEOUS | Status: DC

## 2014-11-08 NOTE — Telephone Encounter (Signed)
erx done

## 2014-11-10 ENCOUNTER — Ambulatory Visit (HOSPITAL_COMMUNITY)
Admission: RE | Admit: 2014-11-10 | Discharge: 2014-11-10 | Disposition: A | Discharge status: Home / Self Care | Payer: Commercial Managed Care - HMO | Source: Ambulatory Visit | Attending: Cardiology | Admitting: Cardiology

## 2014-11-10 VITALS — BP 120/64 | HR 85 | Wt 313.4 lb

## 2014-11-10 DIAGNOSIS — I5043 Acute on chronic combined systolic (congestive) and diastolic (congestive) heart failure: Secondary | ICD-10-CM | POA: Diagnosis not present

## 2014-11-10 DIAGNOSIS — I5022 Chronic systolic (congestive) heart failure: Secondary | ICD-10-CM | POA: Insufficient documentation

## 2014-11-10 DIAGNOSIS — E119 Type 2 diabetes mellitus without complications: Secondary | ICD-10-CM | POA: Diagnosis not present

## 2014-11-10 DIAGNOSIS — I350 Nonrheumatic aortic (valve) stenosis: Secondary | ICD-10-CM | POA: Insufficient documentation

## 2014-11-10 DIAGNOSIS — Z79899 Other long term (current) drug therapy: Secondary | ICD-10-CM | POA: Insufficient documentation

## 2014-11-10 DIAGNOSIS — C9111 Chronic lymphocytic leukemia of B-cell type in remission: Secondary | ICD-10-CM | POA: Diagnosis not present

## 2014-11-10 DIAGNOSIS — E780 Pure hypercholesterolemia: Secondary | ICD-10-CM | POA: Insufficient documentation

## 2014-11-10 DIAGNOSIS — I2510 Atherosclerotic heart disease of native coronary artery without angina pectoris: Secondary | ICD-10-CM | POA: Insufficient documentation

## 2014-11-10 DIAGNOSIS — Z9981 Dependence on supplemental oxygen: Secondary | ICD-10-CM

## 2014-11-10 DIAGNOSIS — Z7982 Long term (current) use of aspirin: Secondary | ICD-10-CM | POA: Diagnosis not present

## 2014-11-10 DIAGNOSIS — N183 Chronic kidney disease, stage 3 unspecified: Secondary | ICD-10-CM

## 2014-11-10 DIAGNOSIS — I252 Old myocardial infarction: Secondary | ICD-10-CM | POA: Insufficient documentation

## 2014-11-10 DIAGNOSIS — G4733 Obstructive sleep apnea (adult) (pediatric): Secondary | ICD-10-CM

## 2014-11-10 DIAGNOSIS — Z794 Long term (current) use of insulin: Secondary | ICD-10-CM | POA: Insufficient documentation

## 2014-11-10 DIAGNOSIS — E669 Obesity, unspecified: Secondary | ICD-10-CM | POA: Diagnosis not present

## 2014-11-10 DIAGNOSIS — I129 Hypertensive chronic kidney disease with stage 1 through stage 4 chronic kidney disease, or unspecified chronic kidney disease: Secondary | ICD-10-CM | POA: Diagnosis not present

## 2014-11-10 DIAGNOSIS — J449 Chronic obstructive pulmonary disease, unspecified: Secondary | ICD-10-CM | POA: Insufficient documentation

## 2014-11-10 DIAGNOSIS — I255 Ischemic cardiomyopathy: Secondary | ICD-10-CM | POA: Insufficient documentation

## 2014-11-10 DIAGNOSIS — K219 Gastro-esophageal reflux disease without esophagitis: Secondary | ICD-10-CM | POA: Diagnosis not present

## 2014-11-10 DIAGNOSIS — Z87891 Personal history of nicotine dependence: Secondary | ICD-10-CM | POA: Insufficient documentation

## 2014-11-10 DIAGNOSIS — I5042 Chronic combined systolic (congestive) and diastolic (congestive) heart failure: Secondary | ICD-10-CM

## 2014-11-10 DIAGNOSIS — I25810 Atherosclerosis of coronary artery bypass graft(s) without angina pectoris: Secondary | ICD-10-CM

## 2014-11-10 DIAGNOSIS — N2889 Other specified disorders of kidney and ureter: Secondary | ICD-10-CM

## 2014-11-10 DIAGNOSIS — N189 Chronic kidney disease, unspecified: Secondary | ICD-10-CM

## 2014-11-10 LAB — BASIC METABOLIC PANEL
Anion gap: 10 (ref 5–15)
BUN: 57 mg/dL — ABNORMAL HIGH (ref 6–23)
CALCIUM: 9.3 mg/dL (ref 8.4–10.5)
CO2: 25 mmol/L (ref 19–32)
Chloride: 103 mmol/L (ref 96–112)
Creatinine, Ser: 2.17 mg/dL — ABNORMAL HIGH (ref 0.50–1.35)
GFR calc Af Amer: 34 mL/min — ABNORMAL LOW (ref >90)
GFR calc non Af Amer: 30 mL/min — ABNORMAL LOW (ref >90)
Glucose, Bld: 147 mg/dL — ABNORMAL HIGH (ref 70–99)
Potassium: 4.1 mmol/L (ref 3.5–5.1)
Sodium: 138 mmol/L (ref 135–145)

## 2014-11-10 LAB — BRAIN NATRIURETIC PEPTIDE: B NATRIURETIC PEPTIDE 5: 171.8 pg/mL — AB (ref 0.0–100.0)

## 2014-11-10 MED ORDER — ISOSORB DINITRATE-HYDRALAZINE 20-37.5 MG PO TABS: 0.5000 | ORAL_TABLET | Freq: Three times a day (TID) | ORAL | Status: DC

## 2014-11-10 NOTE — Telephone Encounter (Signed)
Tried to call pharmacy but number was busy. Will try again.

## 2014-11-10 NOTE — Telephone Encounter (Signed)
Please send back to [facility]

## 2014-11-10 NOTE — Patient Instructions (Addendum)
LABS today (bmet & bnp)  TAKE Torsemide 60mg  2 times a day (for 3 doses) then DECREASE to 40mg  twice a day.  FOLLOW UP in 2 weeks.

## 2014-11-10 NOTE — Telephone Encounter (Signed)
Pharmacy still hasn't received prescription for insulin glargine (LANTUS) 100 UNIT/ML injection [[id number]] . [facility] on [address].

## 2014-11-10 NOTE — Progress Notes (Signed)
Patient ID: [name] [name], male   DOB: 03/02/1946, 69 y.o.   MRN: [id number] PCP: Dr John  HPI:  Mr. 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 COPD (stopped smoking in 2015), OSA, DM2, CLL in remission who was referred by Dr. Nahser for further management of his HF.  10/01/14 Echo shows LV of about 35-40% ( very difficult images) akinesis of the base/mid anterior wall; basal inferior wall, basal posterior wall. AoV was thickened, calcified with restricted motion. Peak and mean gradients through the valve are 54 and 35 mm Hg => moderate aortic stenosis.   He follows with Dr Clance, whose last note says that patient has mild emphysema by CT chest with some air trapping, but no significant airflow obstruction on PFTs. PFTs not on chart. He has been to orientation to start pulmonary rehab.   Apparently, he had a low BP reading at pulmonary rehab orientation.  He says that he has not had any other low BP readings.  BP is 120/64 today.  He was told to decrease torsemide from 40 mg bid to 20 mg bid.  Since then, his weight has been rising.  He started to get short of breath with moderate exertion yesterday.  Normally, he can walk in stores without significant dyspnea.  No chest pain.  No orthopnea/PND. Weight is up 9 lbs on our scales.  Last metolazone use was 3 wks ago.  He does not like using metolazone.   Labs (11/15): LDL 80, HDL 31.5 Labs (4/16): K 4.7, creatinine 2.42  ROS: All systems negative except as listed in HPI, PMH and Problem List.  SH:  History   Social History  . Marital Status: Divorced    [name]  . Number of Children: 2  . Years of Education: 12   Occupational History  . retired    Social History Main Topics  . Smoking status: Former Smoker -- 2.00 packs/day for 53 years    Types: Cigarettes    Quit date:  07/18/2013  . Smokeless tobacco: Never Used  . Alcohol Use: No     Comment: Hasn't drank since 1992 (former alcoholic)  . Drug Use: No  . Sexual Activity: No   Other Topics Concern  . 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   Lives by self, since '78   [name] in [place]     Regular exercise-no   Caffeine Use-no    FH:  Family History  Problem Relation Age of Onset  . Osteoarthritis Mother   . Osteoarthritis Father   . Colon cancer Daughter   . Diabetes Father     entire family  . Heart disease Father     Past Medical History  Diagnosis Date  . Ischemic cardiomyopathy   . Hypertension   . Hypercholesterolemia   . Lower GI bleeding 1980's    "when I was drinking alot" (06/06/2012)  . Chronic combined systolic and diastolic CHF (congestive heart failure) 07/10/2012    a. EF 35-40%, + diastolic dysfunction by echo 12/2012.  . GERD (gastroesophageal reflux disease) 07/12/2012  . Coronary artery disease     a. Cath 05/2012 with CTO of RCA; s/p BMS to severe OM-1 stenosis.  . History of alcoholism     "haven't had a drink in   23 yr" (09/12/2014)  . Moderate aortic stenosis     a. Mod by echo 12/2012.  Marland Kitchen Pancreatitis   . Diverticulosis   . Personal history of rectal adenoma 05/28/2013  . Dietary noncompliance   . Morbid obesity   . Lymphoma     a. 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  . Shortness of breath   . Myocardial infarction 11/13  . Pneumonia 05/2012    "right after stent was put in"  . OSA on CPAP   . On home oxygen therapy     "3L; pretty much last 1 1/2 weeks" (09/12/2014)  . Type II diabetes mellitus   . History of stomach ulcers     "when I drank alot; not bothered w/them since I quit drinking"  . Arthritis     "both shoulders" (09/12/2014)    Current Outpatient Prescriptions  Medication Sig Dispense Refill  . albuterol  (PROVENTIL HFA;VENTOLIN HFA) 108 (90 BASE) MCG/ACT inhaler Inhale 2 puffs into the lungs every 6 (six) hours as needed for wheezing or shortness of breath. 1 Inhaler 11  . AMBULATORY NON FORMULARY MEDICATION Home O2 @@ 3 LMP for 8 hrs at night    . AMBULATORY NON FORMULARY MEDICATION CPAP    . aspirin EC 81 MG tablet Take 81 mg by mouth daily.    Marland Kitchen atorvastatin (LIPITOR) 20 MG tablet Take 1 tablet (20 mg total) by mouth daily at 6 PM. 90 tablet 3  . Blood Glucose Monitoring Suppl (ACCU-CHEK AVIVA PLUS) W/DEVICE KIT Use to check blood sugars twice a day Dx 250.00 1 kit 0  . carvedilol (COREG) 12.5 MG tablet Take 1 tablet (12.5 mg total) by mouth 2 (two) times daily. 180 tablet 3  . glucose blood (ACCU-CHEK AVIVA PLUS) test strip Use to check blood sugars twice a day Dx E11.9 60 each 3  . insulin glargine (LANTUS) 100 UNIT/ML injection Inject 0.4 mLs (40 Units total) into the skin at bedtime. 40 mL 3  . insulin lispro (HUMALOG KWIKPEN) 100 UNIT/ML KiwkPen Inject 10 units three times a day (Patient taking differently: 10-15 Units 3 (three) times daily. If cbg is <150 take 10 units, if >150 take 15) 15 mL 3  . Insulin Pen Needle (BD PEN NEEDLE NANO U/F) 32G X 4 MM MISC Use as directed 1 per day   250.02 100 each 11  . lisinopril (PRINIVIL,ZESTRIL) 10 MG tablet Take 1 tablet (10 mg total) by mouth daily. 30 tablet 11  . metolazone (ZAROXOLYN) 5 MG tablet Take 1 tablet as needed if weight goes about 305 lbs 15 tablet 6  . sitaGLIPtin (JANUVIA) 50 MG tablet Take 25 mg by mouth daily.     Marland Kitchen torsemide (DEMADEX) 20 MG tablet Take 2 tablets (40 mg total) by mouth 2 (two) times daily. 60 tablet 6  . isosorbide-hydrALAZINE (BIDIL) 20-37.5 MG per tablet Take 0.5 tablets by mouth 3 (three) times daily. 45 tablet 3   No current facility-administered medications for this encounter.   Facility-Administered Medications Ordered in Other Encounters  Medication Dose Route Frequency Provider Last Rate Last Dose  .  sodium chloride 0.9 % injection 10 mL  10 mL Intravenous PRN [name], MD   10 mL at 04/25/13 1100    Filed Vitals:   11/10/14 0944  BP: 120/64  Pulse: 85  Weight: 313 lb 6.4 oz (142.157 kg)  SpO2: 96%    PHYSICAL EXAM: General:  Morbidly obese male   HEENT: normal Neck: Thick. JVP 9-10 cm.  Carotids 2+ bilaterally; bilateral radiated bruits. No lymphadenopathy or thryomegaly appreciated. Cor: PMI normal. Regular rate & rhythm. 3/6 systolic crescendo-decrescendo murmur RUSB Lungs: Mildly decreased breath sounds bilaterally.  Abdomen: morbidly obese. oft, nontender, nondistended. No hepatosplenomegaly. No bruits or masses. Good bowel sounds. Extremities: no cyanosis, clubbing, rash.  1+ edema to knees bilaterally.  Neuro: alert & orientedx3, cranial nerves grossly intact. Moves all 4 extremities w/o difficulty. Affect pleasant.  ASSESSMENT & PLAN: 1. Chronic systolic CHF: Ischemic cardiomyopathy.  EF 35-40% Echo 3/16. NYHA class II-III symptoms.  Today, he is volume overloaded on exam after cutting back on torsemide to 20 mg bid.  - Increase torsemide to 60 mg bid x 3 doses then 40 mg bid.  - BMET/BNP today.  - Continue current Coreg and lisinopril.  Will not increase lisinopril further with elevated creatinine.  - Add Bidil 1/2 tab tid.  2. CAD:  CTO of RCA, stent to OM on 11/13 cardiac cath.  Continue ASA 81 daily and statin.  LDL acceptable in 11/15.  3. Morbid obesity: Work on diet/exercise.   4. CKD stage 3: BMET today. Sees Dr Powell for nephrology.  5. Moderate AS: Mean gradient 35 on last echo, difficult study.  In a few months, I would like to repeat echo to re-look at aortic stenosis.  Concerned that his worsened symptoms could be at least partly valve-related.  6. OSA: Continue CPAP.  7. Chronic hypoxic respiratory failure: COPD, OHS/OSA.  He is on oxygen all the time and CPAP at night. Followup with pulmonary.   Followup in 2 wks.   Dalton McLean 11/10/2014 d 

## 2014-11-11 ENCOUNTER — Encounter (HOSPITAL_COMMUNITY)
Admission: RE | Admit: 2014-11-11 | Discharge: 2014-11-11 | Disposition: A | Discharge status: Home / Self Care | Payer: Commercial Managed Care - HMO | Source: Ambulatory Visit | Attending: Internal Medicine | Admitting: Internal Medicine

## 2014-11-11 DIAGNOSIS — I5042 Chronic combined systolic (congestive) and diastolic (congestive) heart failure: Secondary | ICD-10-CM | POA: Diagnosis not present

## 2014-11-11 NOTE — Progress Notes (Signed)
Today, [name] exercised at [facility]. Service time was from 10:30am to 12:20pm.  The patient exercised by performing aerobic, strengthening, and stretching exercises. Oxygen saturation, heart rate, blood pressure, rate of perceived exertion, and shortness of breath were all monitored before, during, and after exercise. [name] [name] presented with no problems at today's exercise session.  The patient did not have an increase in workload intensity during today's exercise session.  Pre-exercise vitals: . Weight kg: 139.6 . Liters of O2: 3 . SpO2: 99 . HR: 88 . BP: 99/62 . CBG: 146  Exercise vitals: . Highest heartrate:  103 . Lowest oxygen saturation: 96 . Highest blood pressure: 122/80 . Liters of 02: 3  Post-exercise vitals: . SpO2: 97 . HR: 79 . BP: 104/60 . Liters of O2: 3 . CBG: 199  Dr. Brand Males, Medical Director Dr. Sheran Fava is immediately available during today's Pulmonary Rehab session for [name] [name] on 11/11/14 at 10:30am class time.

## 2014-11-12 NOTE — Telephone Encounter (Signed)
Pharmacy and pt contacted. ?

## 2014-11-13 ENCOUNTER — Encounter (HOSPITAL_COMMUNITY)
Admission: RE | Admit: 2014-11-13 | Discharge: 2014-11-13 | Disposition: A | Discharge status: Home / Self Care | Payer: Commercial Managed Care - HMO | Source: Ambulatory Visit | Attending: Internal Medicine | Admitting: Internal Medicine

## 2014-11-13 ENCOUNTER — Telehealth (HOSPITAL_COMMUNITY): Payer: Self-pay | Admitting: *Deleted

## 2014-11-13 DIAGNOSIS — I5042 Chronic combined systolic (congestive) and diastolic (congestive) heart failure: Secondary | ICD-10-CM | POA: Diagnosis not present

## 2014-11-13 NOTE — Progress Notes (Signed)
Patient presented to his pulmonary rehab exercise session denying complaints. Upon arrival, check in vitals were as follows: BP manual check 78/40 right arm, 74/38 automatic right arm, 81/48 automatic left arm. HR 71, O2 sats 94 on 3L, weight 140.3 kg, up from 139.6 on Tuesday. Patient stated he had taken all medications as prescribed including his increased dose (40mg ) Demadex and his newly prescribed dose of Bidil. Patient given 1/3 cup of gaterade with BP increasing to 87/35. Patient not given full cup of fluids related to his heart failure and stage 3 ckd. Notified Heather, RN at [facility]. Md to be notified and Nira Conn will contact patient later today. Patient again educated on s/s of symptomatic hypotension and encouraged to seek emergent care if needed. Patient was discharged home per recommendation of Nira Conn, RN. He continued to deny complaints and was in stable condition. Discharge BP 79/32, HR 80, O2 sats 98 on 3L.

## 2014-11-13 NOTE — Telephone Encounter (Signed)
Think he needs to stop Bidil.  Recheck BP off Bidil.

## 2014-11-13 NOTE — Telephone Encounter (Signed)
Porsha in rehab called and said pt came into and his BP was low.  They checked it with automatic cuff and manually and it is in the upper 70s/30-40s, she states pt is asymptomatic and denies dizziness.  At last OV Torsemide was increased back up to 40 mg Twice daily due to extra fluid and pt was started on Bidil 1/2 tab Three times a day, will send to Dr Aundra Dubin to review

## 2014-11-13 NOTE — Telephone Encounter (Signed)
Pt aware and agreeable, he is sch to go back to rehab on Tue

## 2014-11-18 ENCOUNTER — Encounter (HOSPITAL_COMMUNITY)
Admission: RE | Admit: 2014-11-18 | Discharge: 2014-11-18 | Disposition: A | Discharge status: Home / Self Care | Payer: Commercial Managed Care - HMO | Source: Ambulatory Visit | Attending: Internal Medicine | Admitting: Internal Medicine

## 2014-11-18 DIAGNOSIS — G4730 Sleep apnea, unspecified: Secondary | ICD-10-CM | POA: Diagnosis not present

## 2014-11-18 DIAGNOSIS — I5042 Chronic combined systolic (congestive) and diastolic (congestive) heart failure: Secondary | ICD-10-CM | POA: Insufficient documentation

## 2014-11-18 NOTE — Progress Notes (Signed)
Today, [name] [name] exercised at [facility]. Service time was from 10:30am to 12:20pm.  The patient exercised by performing aerobic, strengthening, and stretching exercises. Oxygen saturation, heart rate, blood pressure, rate of perceived exertion, and shortness of breath were all monitored before, during, and after exercise. [name] [name] presented with no problems at today's exercise session.  The patient did not have an increase in workload intensity during today's exercise session.  Pre-exercise vitals: . Weight kg: 139.8 . Liters of O2: 3 . SpO2: 98 . HR: 87 . BP: 104/50 . CBG: 112  Exercise vitals: . Highest heartrate:  101 . Lowest oxygen saturation: 96 . Highest blood pressure: 93/72 . Liters of 02: 3  Post-exercise vitals: . SpO2: 99 . HR: 84 . BP: 105/64 . Liters of O2: 3 . CBG: 101  Dr. Brand Males, Medical Director Dr. Algis Liming is immediately available during today's Pulmonary Rehab session for [name] [name] on 11/18/14 at 10:30am class time.

## 2014-11-20 ENCOUNTER — Encounter (HOSPITAL_COMMUNITY)
Admission: RE | Admit: 2014-11-20 | Discharge: 2014-11-20 | Disposition: A | Discharge status: Home / Self Care | Payer: Commercial Managed Care - HMO | Source: Ambulatory Visit | Attending: Internal Medicine | Admitting: Internal Medicine

## 2014-11-20 DIAGNOSIS — I5042 Chronic combined systolic (congestive) and diastolic (congestive) heart failure: Secondary | ICD-10-CM | POA: Diagnosis not present

## 2014-11-20 NOTE — Progress Notes (Signed)
[name] [name] 69 y.o. male Nutrition Note Spoke with pt. Pt is obese. Pt c/o gaining 60 lb over the past year "because I ate all the time. I ate cookies and donuts frequently." Pt reports he used to drink "a lot of juice." There are some ways the pt can make his eating habits healthier.  Pt's Rate Your Plate results reviewed with pt. Pt wants to lose wt "quickly." Per discussion, pt does not want a wt loss program that he has to do much/any food preparation. Pt prefers pre-prepared meals. Reasonable expectations for wt loss and recommended wt loss programs discussed. Pt tries to avoid salty foods. Pt does not add salt to food.  The role of sodium in lung disease reviewed with pt. Pt expressed understanding of the information reviewed. Pt is diabetic.  Pt checks fasting CBG's daily.  CBG's reportedly "around 200 mg/dL" before meals. Pt was unaware of what an A1c is. A1c discussed. Pt's most recent A1c indicates pt CBG's poorly controlled. Pt noted to have a giant Almond Joy candy bar and a giant Reese's cup with him. Pt stated, "they told me to bring a snack." Healthy snack options discussed as well as the appropriate way to treat hypoglycemia should pt become hypoglycemic. Pt expressed understanding of the information reviewed.   Lab Results  Component Value Date   HGBA1C 8.7* 08/27/2014    Nutrition Diagnosis ? Food-and nutrition-related knowledge deficit related to lack of exposure to information as related to diagnosis of pulmonary disease ? Obesity related to excessive energy intake as evidenced by a BMI of 41.3 ? Limited adherence to nutrition-related recommendations related to poor understanding or disinterest as evidenced by food history. ?  Nutrition Rx/Est. Daily Nutrition Needs for: ? wt loss 2000-2200 Kcal  110-135 gm protein   1500 mg or less sodium     250 gm CHO Nutrition Intervention ? Pt's individual nutrition plan and goals reviewed with pt. ? Benefits of adopting healthy eating  habits discussed when pt's Rate Your Plate reviewed. ? Handout given for 5-day menu ideas ? Pt encouraged to discuss alternative DM medication with MD that may assist pt with wt loss effort ? A list of recommended wt loss programs provided.  ? Pt to attend the Nutrition and Lung Disease class ? Continual client-centered nutrition education by RD, as part of interdisciplinary care. Goal(s) 1. Identify food quantities necessary to achieve wt loss of  -2# per week to a goal wt of 127.1-135.3 kg (280-298 lb) at graduation from pulmonary rehab. 2. Describe the benefit of including fruits, vegetables, whole grains, and low-fat dairy products in a healthy meal plan. 3. CBG's in the normal range or as close to normal as is safely possible. Monitor and Evaluate progress toward nutrition goal with team.   Derek Mound, M.Ed, RD, LDN, CDE 11/20/2014 1:30 PM

## 2014-11-20 NOTE — Progress Notes (Signed)
Today, Demetre exercised at [facility]. Service time was from 10:30am to 12:15pm.  The patient exercised by performing aerobic, strengthening, and stretching exercises. Oxygen saturation, heart rate, blood pressure, rate of perceived exertion, and shortness of breath were all monitored before, during, and after exercise. Ayrton presented with no problems at today's exercise session. The patient attended education today with Rosebud Poles on Warning Signs and Symptoms of Infection.  The patient did have an increase in workload intensity during today's exercise session.  Pre-exercise vitals: . Weight kg: 139.6 . Liters of O2: 3 . SpO2: 93 . HR: 96 . BP: 136/72 . CBG: 181  Exercise vitals: . Highest heartrate:  93 . Lowest oxygen saturation: 93 . Highest blood pressure: 100/60 . Liters of 02: 3  Post-exercise vitals: . SpO2: 94 . HR: 74 . BP: 114/66 . Liters of O2: 3 . CBG: 186  Dr. Brand Males, Medical Director Dr. Algis Liming is immediately available during today's Pulmonary Rehab session for Lillia Corporal on 11/20/14 at 10:30am class time.

## 2014-11-25 ENCOUNTER — Encounter (HOSPITAL_COMMUNITY)
Admission: RE | Admit: 2014-11-25 | Discharge: 2014-11-25 | Disposition: A | Discharge status: Home / Self Care | Payer: Commercial Managed Care - HMO | Source: Ambulatory Visit | Attending: Internal Medicine | Admitting: Internal Medicine

## 2014-11-25 DIAGNOSIS — I5042 Chronic combined systolic (congestive) and diastolic (congestive) heart failure: Secondary | ICD-10-CM | POA: Diagnosis not present

## 2014-11-25 NOTE — Progress Notes (Signed)
Today, [name] [name] exercised at [facility]. [name] [name]. Service time was from 1030 to 1230.  The patient exercised by performing aerobic, strengthening, and stretching exercises. Oxygen saturation, heart rate, blood pressure, rate of perceived exertion, and shortness of breath were all monitored before, during, and after exercise. [name] [name] presented with no problems at today's exercise session. [name] [name] also attended an education session on anatomy and physiology of the respiratory system and intimacy.  The patient did not have an increase in workload intensity during today's exercise session.  Pre-exercise vitals: . Weight kg: 139.3 . Liters of O2: 3L . SpO2: 96 . HR: 88 . BP: 108/60 . CBG: 213  Exercise vitals: . Highest heartrate:  85 . Lowest oxygen saturation: 95 . Highest blood pressure: 100/60 . Liters of 02: 3L  Post-exercise vitals: . SpO2: 93 . HR: 85 . BP: 92/66 . Liters of O2: 3L . CBG: 172  Dr. Brand Males, Medical Director Dr. Algis Liming is immediately available during today's Pulmonary [name] session for [name] [name] on 11/25/2014 at 1030 class time.

## 2014-11-26 ENCOUNTER — Inpatient Hospital Stay (HOSPITAL_COMMUNITY): Admission: RE | Admit: 2014-11-26 | Payer: Commercial Managed Care - HMO | Source: Ambulatory Visit

## 2014-11-27 ENCOUNTER — Other Ambulatory Visit (INDEPENDENT_AMBULATORY_CARE_PROVIDER_SITE_OTHER): Payer: Commercial Managed Care - HMO

## 2014-11-27 ENCOUNTER — Encounter (HOSPITAL_COMMUNITY)
Admission: RE | Admit: 2014-11-27 | Discharge: 2014-11-27 | Disposition: A | Discharge status: Home / Self Care | Payer: Commercial Managed Care - HMO | Source: Ambulatory Visit | Attending: Internal Medicine | Admitting: Internal Medicine

## 2014-11-27 DIAGNOSIS — IMO0002 Reserved for concepts with insufficient information to code with codable children: Secondary | ICD-10-CM

## 2014-11-27 DIAGNOSIS — I5042 Chronic combined systolic (congestive) and diastolic (congestive) heart failure: Secondary | ICD-10-CM | POA: Diagnosis not present

## 2014-11-27 DIAGNOSIS — E1165 Type 2 diabetes mellitus with hyperglycemia: Secondary | ICD-10-CM | POA: Diagnosis not present

## 2014-11-27 LAB — COMPREHENSIVE METABOLIC PANEL
ALK PHOS: 63 U/L (ref 39–117)
ALT: 17 U/L (ref 0–53)
AST: 16 U/L (ref 0–37)
Albumin: 4.1 g/dL (ref 3.5–5.2)
BUN: 61 mg/dL — ABNORMAL HIGH (ref 6–23)
CHLORIDE: 102 meq/L (ref 96–112)
CO2: 27 mEq/L (ref 19–32)
CREATININE: 1.88 mg/dL — AB (ref 0.40–1.50)
Calcium: 9.7 mg/dL (ref 8.4–10.5)
GFR: 46.1 mL/min — ABNORMAL LOW (ref >60.00)
Glucose, Bld: 154 mg/dL — ABNORMAL HIGH (ref 70–99)
Potassium: 4.3 mEq/L (ref 3.5–5.1)
Sodium: 138 mEq/L (ref 135–145)
Total Bilirubin: 0.5 mg/dL (ref 0.2–1.2)
Total Protein: 7.6 g/dL (ref 6.0–8.3)

## 2014-11-27 LAB — HEMOGLOBIN A1C: Hgb A1c MFr Bld: 9.1 % — ABNORMAL HIGH (ref 4.6–6.5)

## 2014-11-27 NOTE — Progress Notes (Addendum)
Today, [name] [name] exercised at [facility]. Service time was from 10:30am to 12:30pm.  The patient exercised by performing aerobic, strengthening, and stretching exercises. Oxygen saturation, heart rate, blood pressure, rate of perceived exertion, and shortness of breath were all monitored before, during, and after exercise. [name] [name] presented with no problems at today's exercise session. The patient attended Pursed Lip and Diaphragmatic Breathing exercise today.  The patient did not have an increase in workload intensity during today's exercise session.  Pre-exercise vitals: . Weight kg: 139.7 . Liters of O2: 3 . SpO2: 100 . HR: 90 . BP: 124/60 . CBG: 137  Exercise vitals: . Highest heartrate:  92 . Lowest oxygen saturation: 96 . Highest blood pressure: 110/60 . Liters of 02: 3  Post-exercise vitals: . SpO2: 100 . HR: 79 . BP: 123/70 . Liters of O2: 3 . CBG: 90  Dr. Brand Males, Medical Director Dr. Wendee Beavers is immediately available during today's Pulmonary Rehab session for [name] [name] on 11/27/14 at 10:30am class time.

## 2014-12-02 ENCOUNTER — Encounter (HOSPITAL_COMMUNITY)
Admission: RE | Admit: 2014-12-02 | Discharge: 2014-12-02 | Disposition: A | Discharge status: Home / Self Care | Payer: Commercial Managed Care - HMO | Source: Ambulatory Visit | Attending: Internal Medicine | Admitting: Internal Medicine

## 2014-12-02 DIAGNOSIS — I5042 Chronic combined systolic (congestive) and diastolic (congestive) heart failure: Secondary | ICD-10-CM | POA: Diagnosis not present

## 2014-12-02 NOTE — Progress Notes (Signed)
Today, Cane exercised at [facility]. Service time was from 10:30am to 12:20pm.  The patient exercised by performing aerobic, strengthening, and stretching exercises. Oxygen saturation, heart rate, blood pressure, rate of perceived exertion, and shortness of breath were all monitored before, during, and after exercise. [name] [name] presented with no problems at today's exercise session.  The patient did have an increase in workload intensity during today's exercise session.  Pre-exercise vitals: . Weight kg: 139.7 . Liters of O2: 3 . SpO2: 97 . HR: 81 . BP: 114/66 . CBG: 175  Exercise vitals: . Highest heartrate:  89 . Lowest oxygen saturation: 97 . Highest blood pressure: 100/64 . Liters of 02: 3  Post-exercise vitals: . SpO2: 100 . HR: 80 . BP: 120/70 . Liters of O2: 3 . CBG: 106   Dr. Brand Males, Medical Director Dr. Wendee Beavers is immediately available during today's Pulmonary Rehab session for [name] [name] on 12/02/14 at 10:30am class time.

## 2014-12-03 ENCOUNTER — Ambulatory Visit (INDEPENDENT_AMBULATORY_CARE_PROVIDER_SITE_OTHER): Payer: Commercial Managed Care - HMO | Admitting: Endocrinology

## 2014-12-03 ENCOUNTER — Telehealth: Payer: Self-pay | Admitting: Endocrinology

## 2014-12-03 ENCOUNTER — Other Ambulatory Visit: Payer: Self-pay | Admitting: *Deleted

## 2014-12-03 VITALS — BP 118/69 | HR 80 | Temp 98.0°F | Resp 18 | Ht 75.0 in | Wt 308.0 lb

## 2014-12-03 DIAGNOSIS — N184 Chronic kidney disease, stage 4 (severe): Secondary | ICD-10-CM | POA: Diagnosis not present

## 2014-12-03 DIAGNOSIS — E1165 Type 2 diabetes mellitus with hyperglycemia: Secondary | ICD-10-CM | POA: Diagnosis not present

## 2014-12-03 DIAGNOSIS — IMO0002 Reserved for concepts with insufficient information to code with codable children: Secondary | ICD-10-CM

## 2014-12-03 MED ORDER — INSULIN LISPRO 100 UNIT/ML (KWIKPEN): 20.0000 [IU] | PEN_INJECTOR | Freq: Every day | SUBCUTANEOUS | Status: DC

## 2014-12-03 MED ORDER — INSULIN GLARGINE 300 UNIT/ML ~~LOC~~ SOPN: 40.0000 [IU] | PEN_INJECTOR | Freq: Every day | SUBCUTANEOUS | Status: DC

## 2014-12-03 MED ORDER — GLUCOSE BLOOD VI STRP: ORAL_STRIP | Status: DC

## 2014-12-03 NOTE — Telephone Encounter (Signed)
rx sent

## 2014-12-03 NOTE — Patient Instructions (Addendum)
Check blood sugars on waking up ..  .. times a week Also check blood sugars about 2 hours after a meal and do this after different meals by rotation  Recommended blood sugar levels on waking up is 100-130 and about 2 hours after meal is 140-180 Please bring blood sugar monitor to each visit.  LANTUS 40 UNITS AT SUPPER DAILY: IF AM SUGAR STILL IS OVER 150 IN  1 WEEK GO UP TO 44  HUMALOG 15 AT BFST AND ONLY 10 WHEN GOING FOR EXERCISE;  TAKE 25 AT SUPPER SO THAT reading after supper is at least <200

## 2014-12-03 NOTE — Progress Notes (Signed)
Patient ID: [name] [name], male   DOB: [birth year], 69 y.o.   MRN: [id number]            Reason for Appointment: F/u for Type 2 Diabetes  Referring physician:  Cathlean Cower  History of Present Illness:          Diagnosis: Type 2 diabetes mellitus, date of diagnosis: 2005       Past history:    In the past  He has had treatment with metformin, glipizide and Januvia   Metformin was stopped when he had worsening renal function  Overall he has had been consistently controlled diabetes.  Lowest A1c has been 7.8,  This was in 2013 In 7/15 his glucose was 538 with his regimen of oral agents and he was started on Lantus insulin  Recent history: On his initial consultation in 11/15 his Lantus dose was increased and he was started on 10 units of Humalog to cover his meals His A1c has been somewhat better with adjusting his insulin but still high at 9.1 Current blood sugar patterns and problems identified:  He was told to take up to 18 units of Humalog at suppertime to cover the evening meal which is his largest meal, he is taking 20 units now; however he is doing this empirically without checking blood sugars after supper  He is also doing 20 units of Humalog at breakfast even though he is not eating as much and sometimes blood sugars are down below 100 with exercise  He does not eat a balanced meal at lunchtime and sometimes has cereal or fruit.  Does not take insulin for this  Although he is taking 36 units of Lantus in the morning his FASTING blood sugars are markedly increased and fairly consistent, may be sometimes better when checked early in the morning.  Previously his blood sugars were not as high in the morning  MONITORING: He is checking his blood sugars mostly before breakfast and lunch  With starting the cardiac rehabilitation program his blood sugars come down significantly after exercise without any hypoglycemia  Compliance: He will frequently forget to take his insulin before  meals especially at suppertime He has difficulty losing weight but his weight appears better now  Oral hypoglycemic drugs the patient is taking are:  Januvia 50 mg       Side effects from medications have been: none INSULIN regimen is described as:  36 Lantus daily in am, Humalog 20 units before meals, usually twice a day   Compliance with the medical regimen: fair  Hypoglycemia:  none   Glucose monitoring:  done        Glucometer: Accucheck     Blood Glucose readings by   PRE-MEAL A.m. Lunch Dinner Bedtime Overall  Glucose range: 154-351 90-186 194, 224    Mean/median: 260 139   204   POST-MEAL PC Breakfast PC Lunch PC Dinner  Glucose range: 1 12-197 192-283 233  Mean/median:  225       Self-care: The diet that the patient has been following is: tries to limit .     Meals: 2 meals per day. Breakfast is usually bacon and eggs and grits.  He is eating more salads and occasionally will make a meal of this.   Otherwise will have fresh or fruit as well as cashew nuts at meals or snacks Lunch small           Exercise: Pulm rehab        Dietician visit, most  recent: never, CDE visit 09/29/14              Weight history:  Previous range:250-325  Wt Readings from Last 3 Encounters:  12/03/14 308 lb (139.708 kg)  11/10/14 313 lb 6.4 oz (142.157 kg)  10/03/14 313 lb (141.976 kg)    Glycemic control:   Lab Results  Component Value Date   HGBA1C 9.1* 11/27/2014   HGBA1C 8.7* 08/27/2014   HGBA1C 12.6* 06/06/2014   Lab Results  Component Value Date   MICROALBUR 0.3 02/04/2014   LDLCALC 80 05/19/2014   CREATININE 1.88* 11/27/2014          Medication List       This list is accurate as of: 12/03/14  9:46 AM.  Always use your most recent med list.               ACCU-CHEK AVIVA PLUS W/DEVICE Kit  - Use to check blood sugars twice a day  - Dx 250.00     albuterol 108 (90 BASE) MCG/ACT inhaler  Commonly known as:  PROVENTIL HFA;VENTOLIN HFA  Inhale 2 puffs into the  lungs every 6 (six) hours as needed for wheezing or shortness of breath.     AMBULATORY NON FORMULARY MEDICATION  Home O2 @@ 3 LMP for 8 hrs at night     AMBULATORY NON FORMULARY MEDICATION  CPAP     aspirin EC 81 MG tablet  Take 81 mg by mouth daily.     atorvastatin 20 MG tablet  Commonly known as:  LIPITOR  Take 1 tablet (20 mg total) by mouth daily at 6 PM.     carvedilol 12.5 MG tablet  Commonly known as:  COREG  Take 1 tablet (12.5 mg total) by mouth 2 (two) times daily.     glucose blood test strip  Commonly known as:  ACCU-CHEK AVIVA PLUS  - Use to check blood sugars twice a day  - Dx E11.9     insulin glargine 100 UNIT/ML injection  Commonly known as:  LANTUS  Inject 0.4 mLs (40 Units total) into the skin at bedtime.     insulin lispro 100 UNIT/ML KiwkPen  Commonly known as:  HUMALOG KWIKPEN  Inject 10 units three times a day     Insulin Pen Needle 32G X 4 MM Misc  Commonly known as:  BD PEN NEEDLE NANO U/F  Use as directed 1 per day   250.02     lisinopril 10 MG tablet  Commonly known as:  PRINIVIL,ZESTRIL  Take 1 tablet (10 mg total) by mouth daily.     metolazone 5 MG tablet  Commonly known as:  ZAROXOLYN  Take 1 tablet as needed if weight goes about 305 lbs     sitaGLIPtin 50 MG tablet  Commonly known as:  JANUVIA  Take 25 mg by mouth daily.     torsemide 20 MG tablet  Commonly known as:  DEMADEX  Take 2 tablets (40 mg total) by mouth 2 (two) times daily.        Allergies:  Allergies  Allergen Reactions  . Other Hives    Poison ivy.     Past Medical History  Diagnosis Date  . Ischemic cardiomyopathy   . Hypertension   . Hypercholesterolemia   . Lower GI bleeding 1980's    "when I was drinking alot" (06/06/2012)  . Chronic combined systolic and diastolic CHF (congestive heart failure) 07/10/2012    a. EF 99-24%, + diastolic dysfunction by echo  12/2012.  Marland Kitchen GERD (gastroesophageal reflux disease) 07/12/2012  . Coronary artery disease       a. Cath 05/2012 with CTO of RCA; s/p BMS to severe OM-1 stenosis.  Marland Kitchen History of alcoholism     "haven't had a drink in 23 yr" (09/12/2014)  . Moderate aortic stenosis     a. Mod by echo 12/2012.  Marland Kitchen Pancreatitis   . Diverticulosis   . Personal history of rectal adenoma 05/28/2013  . Dietary noncompliance   . Morbid obesity   . Lymphoma     a. 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  . Shortness of breath   . Myocardial infarction 11/13  . Pneumonia 05/2012    "right after stent was put in"  . OSA on CPAP   . On home oxygen therapy     "3L; pretty much last 1 1/2 weeks" (09/12/2014)  . Type II diabetes mellitus   . History of stomach ulcers     "when I drank alot; not bothered w/them since I quit drinking"  . Arthritis     "both shoulders" (09/12/2014)    Past Surgical History  Procedure Laterality Date  . Coronary angioplasty with stent placement  06/06/2012    "1"  . Lymph gland excision  07/13/2012    Procedure: CERVICAL LYMPH GLAND EXCISION;  Surgeon: Haywood Lasso, MD;  Location: [facility];  Service: General;  Laterality: Right;  . Portacath placement  07/30/2012    Procedure: INSERTION PORT-A-CATH;  Surgeon: Haywood Lasso, MD;  Location: [facility];  Service: General;  Laterality: Right;  . Colonoscopy N/A 05/28/2013    Procedure: COLONOSCOPY;  Surgeon: Gatha Mayer, MD;  Location: [facility];  Service: Endoscopy;  Laterality: N/A;  . Left and right heart catheterization with coronary angiogram N/A 06/06/2012    Procedure: LEFT AND RIGHT HEART CATHETERIZATION WITH CORONARY ANGIOGRAM;  Surgeon: Pixie Casino, MD;  Location: [facility] CATH LAB;  Service: Cardiovascular;  Laterality: N/A;  . Percutaneous coronary stent intervention (pci-s)  06/06/2012    Procedure: PERCUTANEOUS CORONARY STENT INTERVENTION (PCI-S);  Surgeon: Troy Sine, MD;  Location: [facility] C[facility] LAB;  Service:  Cardiovascular;;    Family History  Problem Relation Age of Onset  . Osteoarthritis Mother   . Osteoarthritis Father   . Colon cancer Daughter   . Diabetes Father     entire family  . Heart disease Father     Social History:  reports that he quit smoking about 16 months ago. His smoking use included Cigarettes. He has a 106 pack-year smoking history. He has never used smokeless tobacco. He reports that he does not drink alcohol or use illicit drugs.    Review of Systems   Most recent eye exam was in 6/15       Lipids: On Lipitor  20 mg dailywith LDL below 100       Lab Results  Component Value Date   CHOL 141 05/19/2014   HDL 31.50* 05/19/2014   LDLCALC 80 05/19/2014   TRIG 149.0 05/19/2014   CHOLHDL 4 05/19/2014                  The blood pressure has been treated with  Carvedilol and lisinopril and appears controlled  Renal dysfunction: this is persistent But relatively better  Lab Results  Component Value Date   CREATININE 1.88* 11/27/2014   CREATININE 2.17* 11/10/2014   CREATININE 2.42* 10/28/2014  LABS:  Lab on 11/27/2014  Component Date Value Ref Range Status  . Hgb A1c MFr Bld 11/27/2014 9.1* 4.6 - 6.5 % Final   Glycemic Control Guidelines for People with Diabetes:Non Diabetic:  <6%Goal of Therapy: <7%Additional Action Suggested:  >8%   . Sodium 11/27/2014 138  135 - 145 mEq/L Final  . Potassium 11/27/2014 4.3  3.5 - 5.1 mEq/L Final  . Chloride 11/27/2014 102  96 - 112 mEq/L Final  . CO2 11/27/2014 27  19 - 32 mEq/L Final  . Glucose, Bld 11/27/2014 154* 70 - 99 mg/dL Final  . BUN 11/27/2014 61* 6 - 23 mg/dL Final  . Creatinine, Ser 11/27/2014 1.88* 0.40 - 1.50 mg/dL Final  . Total Bilirubin 11/27/2014 0.5  0.2 - 1.2 mg/dL Final  . Alkaline Phosphatase 11/27/2014 63  39 - 117 U/L Final  . AST 11/27/2014 16  0 - 37 U/L Final  . ALT 11/27/2014 17  0 - 53 U/L Final  . Total Protein 11/27/2014 7.6  6.0 - 8.3 g/dL Final  . Albumin 11/27/2014 4.1   3.5 - 5.2 g/dL Final  . Calcium 11/27/2014 9.7  8.4 - 10.5 mg/dL Final  . GFR 11/27/2014 46.10* >60.00 mL/min Final    Physical Examination:  BP 118/69 mmHg  Pulse 80  Temp(Src) 98 F (36.7 C)  Resp 18  Ht $R'6\' 3"'dH$  (1.905 m)  Wt 308 lb (139.708 kg)  BMI 38.50 kg/m2  SpO2 99%     ASSESSMENT:  Diabetes type 2, uncontrolled He has checked his blood sugars more often since his last visit but appears to be still having poor control See history of present illness for detailed discussion of his current blood sugar patterns, management and problems identified     He does have significant obesity with BMI 38 For some reason appears to be having high readings in the mornings now and this may be partly related to improved renal function and reduced duration of action of his Lantus He may not be getting enough mealtime insulin at suppertime but difficult to adjust since he has not done any postprandial readings as discussed on each visit Also he will periodically forget to take his Humalog at suppertime He is a good candidate for Victoza but most likely will not be able to afford it   PLAN:   Start monitoring blood sugars as directedafter meals  CHANGE Lantus to either suppertime or bedtime and start taking 40 units.  Discussed how to adjust this based on fasting bloodsugar at least once a week  Reduce Humalog at breakfast( his fasting readings will come down with changing his Lantus regimen  Increase suppertime coverage of Humalog as he is likely having high readings causing high A1c.  Also advised him to be more compliant with taking insulin before meals.  Discussed  the need for mealtime insulin to cover postprandial spikes, action of mealtime insulin, use of the insulin pen, timing and action of the rapid acting insulin as well as to target the two-hour reading of under 180  Consultation with dietitian for detailed meal planning  He will need follow-up A1c on the next  visit   Patient Instructions  Check blood sugars on waking up ..  .. times a week Also check blood sugars about 2 hours after a meal and do this after different meals by rotation  Recommended blood sugar levels on waking up is 100-130 and about 2 hours after meal is 140-180 Please bring blood sugar monitor to each  visit.  LANTUS 40 UNITS AT SUPPER DAILY: IF AM SUGAR STILL IS OVER 150 IN  1 WEEK GO UP TO 44  HUMALOG 15 AT BFST AND ONLY 10 WHEN GOING FOR EXERCISE;  TAKE 25 AT SUPPER SO THAT reading after supper is at least <200   Counseling time on subjects discussed above is over 50% of today's 25 minute visit  Laurina Fischl 12/03/2014, 9:46 AM   Note: This office note was prepared with Estate agent. Any transcriptional errors that result from this process are unintentional.

## 2014-12-03 NOTE — Telephone Encounter (Signed)
Pt is calling for the toujeo rx to be called into the [facility]  Pt also needs test strips, humalog, and if the toujeo is too much he will need to just refill the lantus

## 2014-12-04 ENCOUNTER — Encounter (HOSPITAL_COMMUNITY)
Admission: RE | Admit: 2014-12-04 | Discharge: 2014-12-04 | Disposition: A | Discharge status: Home / Self Care | Payer: Commercial Managed Care - HMO | Source: Ambulatory Visit | Attending: Internal Medicine | Admitting: Internal Medicine

## 2014-12-04 DIAGNOSIS — I5042 Chronic combined systolic (congestive) and diastolic (congestive) heart failure: Secondary | ICD-10-CM | POA: Diagnosis not present

## 2014-12-04 NOTE — Progress Notes (Signed)
Today, [name] [name] exercised at [facility]. Service time was from 10:30am to 12:20pm.  The patient exercised by performing aerobic, strengthening, and stretching exercises. Oxygen saturation, heart rate, blood pressure, rate of perceived exertion, and shortness of breath were all monitored before, during, and after exercise. [name] [name] presented with no problems at today's exercise session.  The patient did not have an increase in workload intensity during today's exercise session.  Pre-exercise vitals: . Weight kg: 140.0 . Liters of O2: 3 . SpO2: 95 . HR: 93 . BP: 86/40 . CBG: 205  Exercise vitals: . Highest heartrate:  87 . Lowest oxygen saturation: 98 . Highest blood pressure: 92/64 . Liters of 02: 3  Post-exercise vitals: . SpO2: 97 . HR: 81 . BP: 88/56 . Liters of O2: 3 . CBG: 265  Dr. Brand Males, Medical Director Dr. Ree Kida is immediately available during today's Pulmonary Rehab session for [name] [name] on 12/04/14 at 10:30am class time.

## 2014-12-09 ENCOUNTER — Encounter (HOSPITAL_COMMUNITY)
Admission: RE | Admit: 2014-12-09 | Discharge: 2014-12-09 | Disposition: A | Discharge status: Home / Self Care | Payer: Commercial Managed Care - HMO | Source: Ambulatory Visit | Attending: Internal Medicine | Admitting: Internal Medicine

## 2014-12-09 DIAGNOSIS — I5042 Chronic combined systolic (congestive) and diastolic (congestive) heart failure: Secondary | ICD-10-CM | POA: Diagnosis not present

## 2014-12-09 NOTE — Progress Notes (Signed)
Today, [name] [name] exercised at [facility]. Service time was from 1030 to 1200.  The patient exercised by performing aerobic, strengthening, and stretching exercises. Oxygen saturation, heart rate, blood pressure, rate of perceived exertion, and shortness of breath were all monitored before, during, and after exercise. [name] [name] presented with no problems at today's exercise session.  The patient did have an increase in workload intensity during today's exercise session.  Pre-exercise vitals: . Weight kg: 140.1 . Liters of O2: 3L . SpO2: 95 . HR: 81 . BP: 100/60 . CBG: 220  Exercise vitals: . Highest heartrate:  89 . Lowest oxygen saturation: 96 . Highest blood pressure: 124/70 . Liters of 02: 3L  Post-exercise vitals: . SpO2: 93 . HR: 86 . BP: 84/46 . Liters of O2: 3L . CBG: 139  Dr. Brand Males, Medical Director Dr. Ree Kida is immediately available during today's Pulmonary Rehab session for [name] [name] on 12/09/2014 at 1030 class time.

## 2014-12-11 ENCOUNTER — Encounter (HOSPITAL_COMMUNITY)
Admission: RE | Admit: 2014-12-11 | Discharge: 2014-12-11 | Disposition: A | Discharge status: Home / Self Care | Payer: Commercial Managed Care - HMO | Source: Ambulatory Visit | Attending: Internal Medicine | Admitting: Internal Medicine

## 2014-12-11 DIAGNOSIS — I5042 Chronic combined systolic (congestive) and diastolic (congestive) heart failure: Secondary | ICD-10-CM | POA: Diagnosis not present

## 2014-12-11 NOTE — Progress Notes (Signed)
Today, [name] [name] exercised at [facility]. Service time was from 1030 to 1230.  The patient exercised by performing aerobic, strengthening, and stretching exercises. Oxygen saturation, heart rate, blood pressure, rate of perceived exertion, and shortness of breath were all monitored before, during, and after exercise. [name] [name] presented with no problems at today's exercise session. [name] [name] also attended an education session with Jeanella Craze on advanced directives.  The patient did not have an increase in workload intensity during today's exercise session.  Pre-exercise vitals: . Weight kg: 140.6 . Liters of O2: 3L . SpO2: 98 . HR: 87 . BP: 90/58 . CBG: 209  Exercise vitals: . Highest heartrate:  93 . Lowest oxygen saturation: 98 . Highest blood pressure: 104/66 . Liters of 02: 3L  Post-exercise vitals: . SpO2: 96 . HR: 76 . BP: 88/60 . Liters of O2: 3L . CBG: 130  Dr. Brand Males, Medical Director Dr. Verlon Au is immediately available during today's Pulmonary Rehab session for [name] [name] on 12/11/2014 at 1030 class time.

## 2014-12-16 ENCOUNTER — Encounter (HOSPITAL_COMMUNITY)
Admission: RE | Admit: 2014-12-16 | Discharge: 2014-12-16 | Disposition: A | Discharge status: Home / Self Care | Payer: Commercial Managed Care - HMO | Source: Ambulatory Visit | Attending: Internal Medicine | Admitting: Internal Medicine

## 2014-12-16 DIAGNOSIS — I5042 Chronic combined systolic (congestive) and diastolic (congestive) heart failure: Secondary | ICD-10-CM | POA: Diagnosis not present

## 2014-12-16 NOTE — Progress Notes (Signed)
Today, [name] [name] exercised at [facility]. Service time was from 1030 to 1215.  The patient exercised by performing aerobic, strengthening, and stretching exercises. Oxygen saturation, heart rate, blood pressure, rate of perceived exertion, and shortness of breath were all monitored before, during, and after exercise. [name] [name] presented with no problems at today's exercise session.  The patient did not have an increase in workload intensity during today's exercise session.  Pre-exercise vitals: . Weight kg: 140.8 . Liters of O2: 3 . SpO2: 98 . HR: 82 . BP: 102/56 . CBG: 152  Exercise vitals: . Highest heartrate:  92 . Lowest oxygen saturation: 98 . Highest blood pressure: 90/50 . Liters of 02: 3  Post-exercise vitals: . SpO2: 99 . HR: 76 . BP: 88/50 . Liters of O2: 3 . CBG: 88, given ginger ale and ate a fig bar, recheck was 104 Dr. Brand Males, Medical Director Dr. Verlon Au is immediately available during today's Pulmonary Rehab session for [name] [name] on 12/16/2014  at 1030 class time.  Marland Kitchen

## 2014-12-16 NOTE — Progress Notes (Signed)
I have reviewed a Home Exercise Prescription with [name] [name] . [name] is not currently exercising at home.  The patient was advised to walk 2-3 days a week for 30 minutes.  Jori Moll and I discussed how to progress their exercise prescription.  The patient stated that their goals were to be able to cut his own grass and lose weight.  The patient stated that they understand the exercise prescription.  We reviewed exercise guidelines, target heart rate during exercise, oxygen use, weather, home pulse oximeter, endpoints for exercise, and goals.  Patient is encouraged to come to me with any questions. I will continue to follow up with the patient to assist them with progression and safety.

## 2014-12-18 ENCOUNTER — Encounter (HOSPITAL_COMMUNITY)
Admission: RE | Admit: 2014-12-18 | Discharge: 2014-12-18 | Disposition: A | Discharge status: Home / Self Care | Payer: Commercial Managed Care - HMO | Source: Ambulatory Visit | Attending: Internal Medicine | Admitting: Internal Medicine

## 2014-12-18 ENCOUNTER — Other Ambulatory Visit: Payer: Self-pay | Admitting: *Deleted

## 2014-12-18 ENCOUNTER — Telehealth: Payer: Self-pay | Admitting: Endocrinology

## 2014-12-18 DIAGNOSIS — G4730 Sleep apnea, unspecified: Secondary | ICD-10-CM | POA: Diagnosis not present

## 2014-12-18 DIAGNOSIS — I5042 Chronic combined systolic (congestive) and diastolic (congestive) heart failure: Secondary | ICD-10-CM | POA: Insufficient documentation

## 2014-12-18 LAB — GLUCOSE, CAPILLARY: Glucose-Capillary: 152 mg/dL — ABNORMAL HIGH (ref 65–99)

## 2014-12-18 MED ORDER — GLUCOSE BLOOD VI STRP: ORAL_STRIP | Status: DC

## 2014-12-18 NOTE — Progress Notes (Signed)
Today, [name] [name] exercised at [facility]. Service time was from 10:30am to 12:25pm.  The patient exercised by performing aerobic, strengthening, and stretching exercises. Oxygen saturation, heart rate, blood pressure, rate of perceived exertion, and shortness of breath were all monitored before, during, and after exercise. [name] [name] presented with no problems at today's exercise session. The patient attended education class today on Nutrition with Derek Mound.  The patient did have an increase in workload intensity during today's exercise session.  Pre-exercise vitals: . Weight kg: 140.0 . Liters of O2: 3 . SpO2: 95 . HR: 82 . BP: 86/60 . CBG: 181  Exercise vitals: . Highest heartrate:  91 . Lowest oxygen saturation: 90 . Highest blood pressure: 110/60 . Liters of 02: 3  Post-exercise vitals: . SpO2: 100 . HR: 77 . BP: 120/80 . Liters of O2: 3 . CBG: 152  Dr. Brand Males, Medical Director Dr. Grandville Silos is immediately available during today's Pulmonary Rehab session for [name] [name] on 12/18/14 at 10:30am class time.

## 2014-12-18 NOTE — Telephone Encounter (Signed)
Patient has some questions about his medication, he is in the lobby waiting until you get off your lunch break.

## 2014-12-18 NOTE — Telephone Encounter (Signed)
Patient came in with questions about his Toujeo, he was amazed that upon waking this morning his blood sugar was 111, he said he didn't take his Humalog at breakfast since his sugar was normal.

## 2014-12-22 NOTE — Telephone Encounter (Signed)
He has an appointment scheduled on 6/29

## 2014-12-22 NOTE — Telephone Encounter (Signed)
Check blood sugars about 2 hours after different meals at least once a day.  Does he have an appointment for follow-up?

## 2014-12-23 ENCOUNTER — Encounter (HOSPITAL_COMMUNITY)
Admission: RE | Admit: 2014-12-23 | Discharge: 2014-12-23 | Disposition: A | Discharge status: Home / Self Care | Payer: Commercial Managed Care - HMO | Source: Ambulatory Visit | Attending: Internal Medicine | Admitting: Internal Medicine

## 2014-12-23 DIAGNOSIS — I5042 Chronic combined systolic (congestive) and diastolic (congestive) heart failure: Secondary | ICD-10-CM | POA: Diagnosis not present

## 2014-12-23 NOTE — Progress Notes (Signed)
Today, [name] exercised at [facility]. Service time was from 10:30am to 12:10pm.  The patient exercised by performing aerobic, strengthening, and stretching exercises. Oxygen saturation, heart rate, blood pressure, rate of perceived exertion, and shortness of breath were all monitored before, during, and after exercise. [name] [name] presented with no problems at today's exercise session.  The patient did not have an increase in workload intensity during today's exercise session.  Pre-exercise vitals: . Weight kg: 139.6 . Liters of O2: 3 . SpO2: 98 . HR: 80 . BP: 92/60 . CBG: 175  Exercise vitals: . Highest heartrate:  86 . Lowest oxygen saturation: 94 . Highest blood pressure: 87/51 . Liters of 02: 3  Post-exercise vitals: . SpO2: 99 . HR: 87 . BP: 88/54 . Liters of O2: 3 . CBG: 174  Dr. Brand Males, Medical Director Dr. Grandville Silos is immediately available during today's Pulmonary Rehab session for Lillia Corporal on 12/23/14 at 10:30am class time.

## 2014-12-25 ENCOUNTER — Encounter (HOSPITAL_COMMUNITY)
Admission: RE | Admit: 2014-12-25 | Discharge: 2014-12-25 | Disposition: A | Discharge status: Home / Self Care | Payer: Commercial Managed Care - HMO | Source: Ambulatory Visit | Attending: Internal Medicine | Admitting: Internal Medicine

## 2014-12-25 DIAGNOSIS — I5042 Chronic combined systolic (congestive) and diastolic (congestive) heart failure: Secondary | ICD-10-CM | POA: Diagnosis not present

## 2014-12-25 NOTE — Progress Notes (Signed)
Today, [name] [name] exercised at [facility]. Service time was from 10:30am to 12:10pm.  The patient exercised by performing aerobic, strengthening, and stretching exercises. Oxygen saturation, heart rate, blood pressure, rate of perceived exertion, and shortness of breath were all monitored before, during, and after exercise. [name] [name] presented with no problems at today's exercise session.The patient attended education today with a respiratory therapist on pursed lip breathing.  The patient did not have an increase in workload intensity during today's exercise session.  Pre-exercise vitals: . Weight kg: 139.8 . Liters of O2: 3 . SpO2: 98 . HR: 86 . BP: 86/40 . CBG: 169  Exercise vitals: . Highest heartrate:  104 . Lowest oxygen saturation: 98 . Highest blood pressure: 124/63 . Liters of 02: 3  Post-exercise vitals: . SpO2: 100 . HR: 76 . BP: 104/66 . Liters of O2: 3 . CBG: 155  Dr. Brand Males, Medical Director Dr. Hartford Poli is immediately available during today's Pulmonary Rehab session for [name] [name] on 12/25/14 at 10:30am class time.

## 2014-12-30 ENCOUNTER — Encounter (HOSPITAL_COMMUNITY)
Admission: RE | Admit: 2014-12-30 | Discharge: 2014-12-30 | Disposition: A | Discharge status: Home / Self Care | Payer: Commercial Managed Care - HMO | Source: Ambulatory Visit | Attending: Internal Medicine | Admitting: Internal Medicine

## 2014-12-30 DIAGNOSIS — I5042 Chronic combined systolic (congestive) and diastolic (congestive) heart failure: Secondary | ICD-10-CM | POA: Diagnosis not present

## 2014-12-30 NOTE — Progress Notes (Signed)
Today, [name] [name] exercised at [facility]. Service time was from 1030 to 1205.  The patient exercised by performing aerobic, strengthening, and stretching exercises. Oxygen saturation, heart rate, blood pressure, rate of perceived exertion, and shortness of breath were all monitored before, during, and after exercise. [name] presented with no problems at today's exercise session.Recheck on admission BP with automatic cuff 93/51  The patient did not have an increase in workload intensity during today's exercise session.  Pre-exercise vitals: . Weight kg: 139.3 . Liters of O2: 3 . SpO2: 95 . HR: 85 . BP: 80/46 . CBG: 206  Exercise vitals: . Highest heartrate:  84 . Lowest oxygen saturation: 96 . Highest blood pressure: 97/65 . Liters of 02: 3  Post-exercise vitals: . SpO2: 99 . HR: 75 . BP: 94/49 . Liters of O2: 3 . CBG: 131 Dr. Brand Males, Medical Director Dr. Hartford Poli is immediately available during today's Pulmonary Rehab session for [name] [name] on 12/30/2014  at 1030 class time.

## 2015-01-01 ENCOUNTER — Encounter (HOSPITAL_COMMUNITY)
Admission: RE | Admit: 2015-01-01 | Discharge: 2015-01-01 | Disposition: A | Discharge status: Home / Self Care | Payer: Commercial Managed Care - HMO | Source: Ambulatory Visit | Attending: Internal Medicine | Admitting: Internal Medicine

## 2015-01-01 NOTE — Progress Notes (Signed)
Today, [name] [name] attended Oxygen use and Safety education at the [facility]. [name] [name] was prepared to exercise after education but his blood sugar was 313 when checked. Upon further questioning, [name] [name] stated he did not take his insulin as prescribed because of his fear of low blood sugars. Re-educated patient on proper diet and importance of following prescribed regimen for high blood sugar control. Patient verbalized understanding but also stated he was in his "doughnut hole" with his medications and was attempting to make his humalog pen last as long as possible. Encouraged patient to notify endocrinologist of his inability to afford insulin. He also stated he has been instructed to contact the [facility] for a primary care provider and medications. Will attempt to assist patient with this process. Patient sent home in stable condition without exercising and instructed to take insulin as prescribed.

## 2015-01-06 ENCOUNTER — Encounter (HOSPITAL_COMMUNITY)
Admission: RE | Admit: 2015-01-06 | Discharge: 2015-01-06 | Disposition: A | Discharge status: Home / Self Care | Payer: Commercial Managed Care - HMO | Source: Ambulatory Visit | Attending: Internal Medicine | Admitting: Internal Medicine

## 2015-01-06 DIAGNOSIS — I5042 Chronic combined systolic (congestive) and diastolic (congestive) heart failure: Secondary | ICD-10-CM | POA: Diagnosis not present

## 2015-01-06 NOTE — Progress Notes (Signed)
Today, Ever exercised at [facility]. Service time was from 1030 to 1210.  The patient exercised by performing aerobic, strengthening, and stretching exercises. Oxygen saturation, heart rate, blood pressure, rate of perceived exertion, and shortness of breath were all monitored before, during, and after exercise. [name] [name] presented with no problems at today's exercise session.  The patient did  have an increase in workload intensity during today's exercise session.  Pre-exercise vitals: . Weight kg: 140.4 . Liters of O2: 3 . SpO2: 100 . HR: 77 . BP: 110/54 . CBG: 149  Exercise vitals: . Highest heartrate:  87 . Lowest oxygen saturation: 98 . Highest blood pressure: 90/60 . Liters of 02: 3  Post-exercise vitals: . SpO2: 97 . HR: 79 . BP: 92/56 recheck BP 102/56 . Liters of O2: 3 . CBG: 121 Dr. Brand Males, Medical Director Dr. Tana Coast is immediately available during today's Pulmonary Rehab session for [name] [name] on 01/06/2015  at 1030 class time.  Marland Kitchen

## 2015-01-07 ENCOUNTER — Telehealth: Payer: Self-pay | Admitting: Internal Medicine

## 2015-01-07 NOTE — Telephone Encounter (Signed)
Prescribed by Cardiology

## 2015-01-07 NOTE — Telephone Encounter (Signed)
Patient is requesting refill for torsemide (DEMADEX) 20 MG tablet [[id number]. Pharmacy is [facility] on [address]

## 2015-01-08 ENCOUNTER — Other Ambulatory Visit: Payer: Self-pay

## 2015-01-08 ENCOUNTER — Encounter (HOSPITAL_COMMUNITY)
Admission: RE | Admit: 2015-01-08 | Discharge: 2015-01-08 | Disposition: A | Discharge status: Home / Self Care | Payer: Commercial Managed Care - HMO | Source: Ambulatory Visit | Attending: Internal Medicine | Admitting: Internal Medicine

## 2015-01-08 DIAGNOSIS — I5042 Chronic combined systolic (congestive) and diastolic (congestive) heart failure: Secondary | ICD-10-CM | POA: Diagnosis not present

## 2015-01-08 MED ORDER — TORSEMIDE 20 MG PO TABS: 40.0000 mg | ORAL_TABLET | Freq: Two times a day (BID) | ORAL | Status: DC

## 2015-01-08 NOTE — Progress Notes (Signed)
Today, [name] exercised at [facility]. Service time was from 10:30am to 12:05pm.  The patient exercised by performing aerobic, strengthening, and stretching exercises. Oxygen saturation, heart rate, blood pressure, rate of perceived exertion, and shortness of breath were all monitored before, during, and after exercise. [name] [name] presented with no problems at today's exercise session. The patient attended education class today on Living with Pulmonary Disease.  The patient did not have an increase in workload intensity during today's exercise session.  Pre-exercise vitals: . Weight kg: 139.3 . Liters of O2: 3 . SpO2: 97 . HR: 79 . BP: 93/62 . CBG: 158  Exercise vitals: . Highest heartrate:  88 . Lowest oxygen saturation: 97 . Highest blood pressure: 90/50 . Liters of 02: 3  Post-exercise vitals: . SpO2: 96 . HR: 64 . BP: 102/60 . Liters of O2: 3 . CBG: 149  Dr. Brand Males, Medical Director Dr. Hartford Poli is immediately available during today's Pulmonary Rehab session for [name] [name] on 01/08/15 at 10:30am class time.

## 2015-01-08 NOTE — Telephone Encounter (Signed)
Left message on v/m to call back

## 2015-01-13 ENCOUNTER — Encounter (HOSPITAL_COMMUNITY)
Admission: RE | Admit: 2015-01-13 | Discharge: 2015-01-13 | Disposition: A | Discharge status: Home / Self Care | Payer: Commercial Managed Care - HMO | Source: Ambulatory Visit | Attending: Internal Medicine | Admitting: Internal Medicine

## 2015-01-14 ENCOUNTER — Other Ambulatory Visit: Payer: Self-pay | Admitting: *Deleted

## 2015-01-14 ENCOUNTER — Encounter: Payer: Self-pay | Admitting: Endocrinology

## 2015-01-14 ENCOUNTER — Ambulatory Visit (INDEPENDENT_AMBULATORY_CARE_PROVIDER_SITE_OTHER): Payer: Commercial Managed Care - HMO | Admitting: Endocrinology

## 2015-01-14 VITALS — BP 118/68 | HR 83 | Temp 98.1°F | Resp 18 | Ht 75.0 in | Wt 308.2 lb

## 2015-01-14 DIAGNOSIS — N184 Chronic kidney disease, stage 4 (severe): Secondary | ICD-10-CM

## 2015-01-14 DIAGNOSIS — IMO0002 Reserved for concepts with insufficient information to code with codable children: Secondary | ICD-10-CM

## 2015-01-14 DIAGNOSIS — E1165 Type 2 diabetes mellitus with hyperglycemia: Secondary | ICD-10-CM

## 2015-01-14 DIAGNOSIS — E785 Hyperlipidemia, unspecified: Secondary | ICD-10-CM

## 2015-01-14 MED ORDER — GLUCOSE BLOOD VI STRP: ORAL_STRIP | Status: AC

## 2015-01-14 NOTE — Progress Notes (Signed)
Patient ID: [name] [name], male   DOB: July 24, 1945, 69 y.o.   MRN: [id number]            Reason for Appointment: F/u for Type 2 Diabetes  Referring physician:  Cathlean Cower  History of Present Illness:          Diagnosis: Type 2 diabetes mellitus, date of diagnosis: 2005       Past history:    In the past  He has had treatment with metformin, glipizide and Januvia   Metformin was stopped when he had worsening renal function  Overall he has had been consistently controlled diabetes.  Lowest A1c has been 7.8,  This was in 2013 In 7/15 his glucose was 538 with his regimen of oral agents and he was started on Lantus insulin  Recent history:  INSULIN regimen is described as:  Toujeo 40 units at bedtime, Humalog 25 units before meals  On his initial consultation in 11/15 his Lantus dose was increased and he was started on Humalog to cover his meals His A1c however indicates continued poor control On his visit in 5/16 he was switched from Lantus to Toujeo at night Also his Humalog dose has been increased to 25 units at mealtimes Current blood sugar patterns and problems identified:   He has much higher blood sugars since his last visit and not clear why even though he is fairly consistent with his insulin regimen; he may still possibly forget his Humalog at times  He does say that he is eating some fruits like watermelon and drinking juice on and off especially at night  His blood sugars are recently the highest in the evenings and overnight, occasionally as high as 435 at 2 AM  His fasting blood sugars are still fairly high consistently but occasionally may be below 200.  He was told to increase his Toujeo if fasting readings were high but he has not done so  Overall has marked variability in his blood sugars at all times  His blood sugars are generally better after he exercises on Tuesdays and Thursdays in the mornings but he is not checking his blood sugars around lunchtime any other  day  He is still not able to do any exercise He has difficulty losing weight   Oral hypoglycemic drugs the patient is taking are:  Januvia 50 mg       Side effects from medications have been: none Compliance with the medical regimen: fair  Hypoglycemia:  none   Glucose monitoring:  done        Glucometer: Accucheck     Blood Glucose readings by   Mean values apply above for all meters except median for One Touch  PRE-MEAL Fasting Lunch Dinner Bedtime Overall  Glucose range:  158-416   121-312   234-358    88-435   Mean/median:      248    POST-MEAL PC Breakfast PC Lunch PC Dinner  Glucose range:    158-384   Mean/median:        Self-care: The diet that the patient has been following is: None      Meals: 2 meals per day. Breakfast is usually meat and eggs.  He is eating more salads and occasionally will make a meal of this.   Otherwise will have fresh or fruit as well as cashew nuts at meals or snacks        Exercise: Pulm rehab on  [facility]  visit, most recent: never, CDE visit 09/29/14              Weight history:  Previous range:250-325  Wt Readings from Last 3 Encounters:  01/14/15 308 lb 3.2 oz (139.799 kg)  12/03/14 308 lb (139.708 kg)  11/10/14 313 lb 6.4 oz (142.157 kg)    Glycemic control:   Lab Results  Component Value Date   HGBA1C 9.1* 11/27/2014   HGBA1C 8.7* 08/27/2014   HGBA1C 12.6* 06/06/2014   Lab Results  Component Value Date   MICROALBUR 0.3 02/04/2014   LDLCALC 80 05/19/2014   CREATININE 1.88* 11/27/2014          Medication List       This list is accurate as of: 01/14/15  1:48 PM.  Always use your most recent med list.               ACCU-CHEK AVIVA PLUS W/DEVICE Kit  Use to check blood sugars twice a day Dx 250.00     albuterol 108 (90 BASE) MCG/ACT inhaler  Commonly known as:  PROVENTIL HFA;VENTOLIN HFA  Inhale 2 puffs into the lungs every 6 (six) hours as needed for wheezing or shortness of breath.      AMBULATORY NON FORMULARY MEDICATION  Home O2 @@ 3 LMP for 8 hrs at night     AMBULATORY NON FORMULARY MEDICATION  CPAP     aspirin EC 81 MG tablet  Take 81 mg by mouth daily.     atorvastatin 20 MG tablet  Commonly known as:  LIPITOR  Take 1 tablet (20 mg total) by mouth daily at 6 PM.     carvedilol 12.5 MG tablet  Commonly known as:  COREG  Take 1 tablet (12.5 mg total) by mouth 2 (two) times daily.     glucose blood test strip  Commonly known as:  ACCU-CHEK AVIVA PLUS  Use to check blood sugars five times a day Dx E11.9     insulin glargine 100 UNIT/ML injection  Commonly known as:  LANTUS  Inject 0.4 mLs (40 Units total) into the skin at bedtime.     Insulin Glargine 300 UNIT/ML Sopn  Commonly known as:  TOUJEO SOLOSTAR  Inject 40 Units into the skin at bedtime.     insulin lispro 100 UNIT/ML KiwkPen  Commonly known as:  HUMALOG KWIKPEN  Inject 0.2 mLs (20 Units total) into the skin daily. If cbg is <150 take 10 units, if >150 take 15     Insulin Pen Needle 32G X 4 MM Misc  Commonly known as:  BD PEN NEEDLE NANO U/F  Use as directed 1 per day   250.02     lisinopril 10 MG tablet  Commonly known as:  PRINIVIL,ZESTRIL  Take 1 tablet (10 mg total) by mouth daily.     metolazone 5 MG tablet  Commonly known as:  ZAROXOLYN  Take 1 tablet as needed if weight goes about 305 lbs     sitaGLIPtin 50 MG tablet  Commonly known as:  JANUVIA  Take 25 mg by mouth daily.     torsemide 20 MG tablet  Commonly known as:  DEMADEX  Take 2 tablets (40 mg total) by mouth 2 (two) times daily.        Allergies:  Allergies  Allergen Reactions  . Other Hives    Poison ivy.     Past Medical History  Diagnosis Date  . Ischemic cardiomyopathy   . Hypertension   . Hypercholesterolemia   .  Lower GI bleeding 1980's    "when I was drinking alot" (06/06/2012)  . Chronic combined systolic and diastolic CHF (congestive heart failure) 07/10/2012    a. EF 10-27%, + diastolic  dysfunction by echo 12/2012.  Marland Kitchen GERD (gastroesophageal reflux disease) 07/12/2012  . Coronary artery disease     a. Cath 05/2012 with CTO of RCA; s/p BMS to severe OM-1 stenosis.  Marland Kitchen History of alcoholism     "haven't had a drink in 23 yr" (09/12/2014)  . Moderate aortic stenosis     a. Mod by echo 12/2012.  Marland Kitchen Pancreatitis   . Diverticulosis   . Personal history of rectal adenoma 05/28/2013  . Dietary noncompliance   . Morbid obesity   . Lymphoma     a. 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  . Shortness of breath   . Myocardial infarction 11/13  . Pneumonia 05/2012    "right after stent was put in"  . OSA on CPAP   . On home oxygen therapy     "3L; pretty much last 1 1/2 weeks" (09/12/2014)  . Type II diabetes mellitus   . History of stomach ulcers     "when I drank alot; not bothered w/them since I quit drinking"  . Arthritis     "both shoulders" (09/12/2014)    Past Surgical History  Procedure Laterality Date  . Coronary angioplasty with stent placement  06/06/2012    "1"  . Lymph gland excision  07/13/2012    Procedure: CERVICAL LYMPH GLAND EXCISION;  Surgeon: Haywood Lasso, MD;  Location: [place];  Service: General;  Laterality: Right;  . Portacath placement  07/30/2012    Procedure: INSERTION PORT-A-CATH;  Surgeon: Haywood Lasso, MD;  Location: [place];  Service: General;  Laterality: Right;  . Colonoscopy N/A 05/28/2013    Procedure: COLONOSCOPY;  Surgeon: Gatha Mayer, MD;  Location: [facility];  Service: Endoscopy;  Laterality: N/A;  . Left and right heart catheterization with coronary angiogram N/A 06/06/2012    Procedure: LEFT AND RIGHT HEART CATHETERIZATION WITH CORONARY ANGIOGRAM;  Surgeon: Pixie Casino, MD;  Location: [facility] C[facility];  Service: Cardiovascular;  Laterality: N/A;  . Percutaneous coronary stent intervention (pci-s)  06/06/2012    Procedure:  PERCUTANEOUS CORONARY STENT INTERVENTION (PCI-S);  Surgeon: Troy Sine, MD;  Location: [facility] C[facility] LAB;  Service: Cardiovascular;;    Family History  Problem Relation Age of Onset  . Osteoarthritis Mother   . Osteoarthritis Father   . Colon cancer Daughter   . Diabetes Father     entire family  . Heart disease Father     Social History:  reports that he quit smoking about 17 months ago. His smoking use included Cigarettes. He has a 106 pack-year smoking history. He has never used smokeless tobacco. He reports that he does not drink alcohol or use illicit drugs.    Review of Systems   Most recent eye exam was in 6/15       Lipids: On Lipitor  20 mg daily with LDL below 100, HDL low        Lab Results  Component Value Date   CHOL 141 05/19/2014   HDL 31.50* 05/19/2014   LDLCALC 80 05/19/2014   TRIG 149.0 05/19/2014   CHOLHDL 4 05/19/2014                  The blood pressure has been treated  with  Carvedilol and lisinopril and is controlled  Renal dysfunction: this is persistent, etiology unclear, relatively better in 5/16  Lab Results  Component Value Date   CREATININE 1.88* 11/27/2014   CREATININE 2.17* 11/10/2014   CREATININE 2.42* 10/28/2014      LABS:  No visits with results within 1 Week(s) from this visit. Latest known visit with results is:  Hospital Outpatient Visit on 12/18/2014  Component Date Value Ref Range Status  . Glucose-Capillary 12/18/2014 152* 65 - 99 mg/dL Final    Physical Examination:  BP 118/68 mmHg  Pulse 83  Temp(Src) 98.1 F (36.7 C)  Resp 18  Ht _0  (1.905 m)  Wt 308 lb 3.2 oz (139.799 kg)  BMI 38.52 kg/m2  SpO2 97%     ASSESSMENT:  Diabetes type 2, uncontrolled He has much higher blood sugars since his last visit and not clear why See history of present illness for detailed discussion of his current blood sugar patterns, management and problems identified Although he was switched to [facility] for more consistent 24  control his blood sugars are high most of the day except when he exercises in the mornings Also his mealtime insulin has been increased He does need to improve his diet significantly and is scheduled to see the dietitian.  In the meantime  asked him to cut back on fruits and juices He is a good candidate for Victoza but most likely will not be able to afford it He has limited choices for other insulin sensitizers because of his renal dysfunction and history of CHF  PLAN:   Increase all insulin doses as discussed below except when he is exercising  Improve diet  Consultation with dietitian for detailed meal planning  He will need follow-up A1c on the next visit   Patient Instructions   TOUJEO 45 UNITS AT SUPPER DAILY: IF AM SUGAR STILL IS OVER 150 IN 1 WEEK GO UP TO 50  STOP having juice and fruit at night  HUMALOG 15 WHEN GOING FOR EXERCISE;  TAKE 30 AT lunch and SUPPER       Counseling time on subjects discussed above is over 50% of today's 25 minute visit  Maekayla Giorgio 01/14/2015, 1:48 PM   Note: This office note was prepared with Estate agent. Any transcriptional errors that result from this process are unintentional.

## 2015-01-14 NOTE — Patient Instructions (Signed)
TOUJEO 45 UNITS AT SUPPER DAILY: IF AM SUGAR STILL IS OVER 150 IN 1 WEEK GO UP TO 50  STOP having juice and fruit at night  HUMALOG 15 WHEN GOING FOR EXERCISE;  TAKE 30 AT lunch and SUPPER

## 2015-01-15 ENCOUNTER — Encounter (HOSPITAL_COMMUNITY): Admission: RE | Admit: 2015-01-15 | Payer: Commercial Managed Care - HMO | Source: Ambulatory Visit

## 2015-01-15 ENCOUNTER — Encounter: Payer: Self-pay | Admitting: Dietician

## 2015-01-15 ENCOUNTER — Encounter: Payer: Commercial Managed Care - HMO | Attending: Endocrinology | Admitting: Dietician

## 2015-01-15 VITALS — Ht 75.0 in | Wt 308.0 lb

## 2015-01-15 DIAGNOSIS — E118 Type 2 diabetes mellitus with unspecified complications: Secondary | ICD-10-CM

## 2015-01-15 DIAGNOSIS — I5043 Acute on chronic combined systolic (congestive) and diastolic (congestive) heart failure: Secondary | ICD-10-CM

## 2015-01-15 DIAGNOSIS — Z713 Dietary counseling and surveillance: Secondary | ICD-10-CM | POA: Diagnosis not present

## 2015-01-15 DIAGNOSIS — Z794 Long term (current) use of insulin: Secondary | ICD-10-CM | POA: Insufficient documentation

## 2015-01-15 NOTE — Patient Instructions (Signed)
Avoid the juice. Have regularly scheduled balanced meals. Consider not eating as late in the evening. Half your plate should be vegetables.

## 2015-01-15 NOTE — Progress Notes (Signed)
  Medical Nutrition Therapy:  Appt start time: 1300 end time:  3557.   Assessment:  Primary concerns today: Patient is here alone.  He would like to learn to better control his blood sugar and effect his quality of life. He has worn oxygen for the past 2 years.  Hx includes type 2 diabetes, CHF, renal disease, and alcoholism (he has not drank in 23 years).  He avoids adding additional salt to foods but diet is relatively high in sodium due to processed meats.  His las HgbA1C was 9.1% 11/27/14 decreased from 18.5% 02/04/14. He checks his blood sugar up to 6 times daily.  Blood sugars range from 130- 250.  He has quit smoking 2-3 weeks ago and reports decreasing butter intake and began taking medications correctly.  Patient is drinking >32 oz of juice every day.  States that his weight increased from 250-300 lbs 2 years ago due to overeating with increased stress and strained relationship with his daughter.   He is currently going to pulmonary rehab and reports increased stamina.  He has been trying to follow a diet lower in carbohydrates but drinks a lot of juice and increased portion sizes of fruit (1/4 watermelon in one sitting.)   He spoke much about his life hx, illnesses, and recnet death of a childhood friend but reported that he was not depressed but was very distract able.  Patient at times was resistant to information/ listening.  Did verbalize need to avoid juice and felt that this was a change that he could make.  Patient lives alone.  Patient shops and cooks for himself.  He is a retired Administrator.  Occasionally eats out (6" sub).  Preferred Learning Style:   No preference indicated  Learning Readiness:   Contemplating  MEDICATIONS: see list to include:  Toujeo 40 units at bedtime, Humalog based on blood sugar.  Is not taking Januvia due to cost and felt that it did not prove beneficial  DIETARY INTAKE: Not a lot of fried foods, hamburgers rare,  24-hr recall:  B (8 AM): sausage, 2  scrambled eggs  Snk ( AM): fruit L ( PM):  Sometimes skips or leftovers Snk ( PM): fruit D (9PM): sandwich OR 6 oz baked salmon, no salt added canned vegetables Snk ( PM): fruit Beverages: about 32 oz per day of juice, water  Usual physical activity: walked at [employer], and [employer] and going to pulmonary rehab twice per week  Estimated energy needs: 1600 calories 180 g carbohydrates 100 g protein 53 g fat  Progress Towards Goal(s):  In progress.   Nutritional Diagnosis:  NB-1.1 Food and nutrition-related knowledge deficit As related to balance of carbohydrate, protein, and fat.  As evidenced by diet hx and patient report..    Intervention:  Nutrition counseling and diabetes education initiated. Discussed Carb Counting by food group as method of portion control.  Discussed sodium and need to avoid processed meat.  Discussed basics of mea planning.   Avoid the juice. Have regularly scheduled balanced meals. Consider not eating as late in the evening. Half your plate should be vegetables.  Teaching Method Utilized:  Visual Auditory Hands on  Handouts given during visit include:  Meal plan card  Snack list  Building a balanced meal  Barriers to learning/adherence to lifestyle change: health and willingness/ability to change  Demonstrated degree of understanding via:  Teach Back   Monitoring/Evaluation:  Dietary intake, exercise, label reading, and body weight prn.

## 2015-01-20 ENCOUNTER — Encounter (HOSPITAL_COMMUNITY)
Admission: RE | Admit: 2015-01-20 | Discharge: 2015-01-20 | Disposition: A | Discharge status: Home / Self Care | Payer: Commercial Managed Care - HMO | Source: Ambulatory Visit | Attending: Internal Medicine | Admitting: Internal Medicine

## 2015-01-20 DIAGNOSIS — I5042 Chronic combined systolic (congestive) and diastolic (congestive) heart failure: Secondary | ICD-10-CM | POA: Insufficient documentation

## 2015-01-20 DIAGNOSIS — G4730 Sleep apnea, unspecified: Secondary | ICD-10-CM | POA: Insufficient documentation

## 2015-01-20 NOTE — Progress Notes (Signed)
Today, [name] [name] exercised at [facility] [facility]. Service time was from 10:30am to 12:05pm.  The patient exercised by performing aerobic, strengthening, and stretching exercises. Oxygen saturation, heart rate, blood pressure, rate of perceived exertion, and shortness of breath were all monitored before, during, and after exercise. Kasten presented with no problems at today's exercise session.  The patient did not have an increase in workload intensity during today's exercise session.  Pre-exercise vitals: . Weight kg: 139.6 . Liters of O2: 3 . SpO2: 98 . HR: 75 . BP: 92/50 . CBG: 125/155  Exercise vitals: . Highest heartrate:  84 . Lowest oxygen saturation: 91 . Highest blood pressure: 94/50 . Liters of 02: 3  Post-exercise vitals: . SpO2: 96 . HR: 63 . BP: 92/64 . Liters of O2: 3 . CBG: 147  Dr. Brand Males, Medical Director Dr. Sloan Leiter is immediately available during today's Pulmonary Rehab session for [name] [name] on 01/20/2015 at 10:30am class time.

## 2015-01-22 ENCOUNTER — Encounter (HOSPITAL_COMMUNITY): Payer: Commercial Managed Care - HMO

## 2015-01-27 ENCOUNTER — Encounter (HOSPITAL_COMMUNITY)
Admission: RE | Admit: 2015-01-27 | Discharge: 2015-01-27 | Disposition: A | Discharge status: Home / Self Care | Payer: Commercial Managed Care - HMO | Source: Ambulatory Visit | Attending: Internal Medicine | Admitting: Internal Medicine

## 2015-01-27 DIAGNOSIS — I5042 Chronic combined systolic (congestive) and diastolic (congestive) heart failure: Secondary | ICD-10-CM | POA: Diagnosis not present

## 2015-01-27 NOTE — Progress Notes (Signed)
Today, Britten exercised at [employer] [facility]. Service time was from 1030 to 1200.  The patient exercised by performing aerobic, strengthening, and stretching exercises. Oxygen saturation, heart rate, blood pressure, rate of perceived exertion, and shortness of breath were all monitored before, during, and after exercise. Alastor presented with no problems at today's exercise session.  The patient did not have an increase in workload intensity during today's exercise session.  Pre-exercise vitals: . Weight kg: 139.9 . Liters of O2: 3 . SpO2: 93 . HR: 90 . BP: 88/50 . CBG: 178  Exercise vitals: . Highest heartrate:  84 . Lowest oxygen saturation: 96 . Highest blood pressure: 94/60 . Liters of 02: 3  Post-exercise vitals: . SpO2: 99 . HR: 71 . BP: 96/57 . Liters of O2: 3 . CBG: 144 Dr. Brand Males, Medical Director Dr. Coralyn Pear is immediately available during today's Pulmonary Rehab session for Lexton Hidalgo on 01/27/2015  at 1030 class time.

## 2015-01-29 ENCOUNTER — Encounter (HOSPITAL_COMMUNITY)
Admission: RE | Admit: 2015-01-29 | Discharge: 2015-01-29 | Disposition: A | Discharge status: Home / Self Care | Payer: Commercial Managed Care - HMO | Source: Ambulatory Visit | Attending: Internal Medicine | Admitting: Internal Medicine

## 2015-01-29 DIAGNOSIS — I5042 Chronic combined systolic (congestive) and diastolic (congestive) heart failure: Secondary | ICD-10-CM | POA: Diagnosis not present

## 2015-01-29 NOTE — Progress Notes (Signed)
Today, [name] [name] exercised at [facility]. Service time was from 1030 to 1225.  The patient exercised by performing aerobic, strengthening, and stretching exercises. Oxygen saturation, heart rate, blood pressure, rate of perceived exertion, and shortness of breath were all monitored before, during, and after exercise. [name] [name] presented with no problems at today's exercise session. [name] [name] also attended an education session on warning signs and symptoms.  The patient did not have an increase in workload intensity during today's exercise session.  Pre-exercise vitals: . Weight kg: 140.5 . Liters of O2: 3L . SpO2: 97 . HR: 85 . BP: 100/59 . CBG: 171  Exercise vitals: . Highest heartrate:  83 . Lowest oxygen saturation: 96 . Highest blood pressure: 88/52 . Liters of 02: 3L  Post-exercise vitals: . SpO2: 100 . HR: 78 . BP: 97/80 . Liters of O2: 3L . CBG: 126  Dr. Brand Males, Medical Director Dr. Dyann Kief is immediately available during today's Pulmonary Rehab session for [name] [name] on 01/29/2015 at 1030 class time.

## 2015-02-03 ENCOUNTER — Encounter (HOSPITAL_COMMUNITY): Payer: Commercial Managed Care - HMO

## 2015-02-04 ENCOUNTER — Encounter: Payer: Self-pay | Admitting: *Deleted

## 2015-02-05 ENCOUNTER — Ambulatory Visit (HOSPITAL_BASED_OUTPATIENT_CLINIC_OR_DEPARTMENT_OTHER): Payer: Commercial Managed Care - HMO | Admitting: Oncology

## 2015-02-05 ENCOUNTER — Telehealth: Payer: Self-pay | Admitting: Oncology

## 2015-02-05 ENCOUNTER — Ambulatory Visit (HOSPITAL_BASED_OUTPATIENT_CLINIC_OR_DEPARTMENT_OTHER): Payer: Commercial Managed Care - HMO

## 2015-02-05 ENCOUNTER — Other Ambulatory Visit (HOSPITAL_BASED_OUTPATIENT_CLINIC_OR_DEPARTMENT_OTHER): Payer: Commercial Managed Care - HMO

## 2015-02-05 ENCOUNTER — Encounter (HOSPITAL_COMMUNITY): Payer: Commercial Managed Care - HMO

## 2015-02-05 VITALS — BP 94/39 | HR 81 | Temp 97.6°F | Resp 18 | Ht 75.0 in | Wt 306.8 lb

## 2015-02-05 DIAGNOSIS — Z95828 Presence of other vascular implants and grafts: Secondary | ICD-10-CM

## 2015-02-05 DIAGNOSIS — C9110 Chronic lymphocytic leukemia of B-cell type not having achieved remission: Secondary | ICD-10-CM | POA: Diagnosis not present

## 2015-02-05 DIAGNOSIS — R0602 Shortness of breath: Secondary | ICD-10-CM

## 2015-02-05 DIAGNOSIS — C8590 Non-Hodgkin lymphoma, unspecified, unspecified site: Secondary | ICD-10-CM

## 2015-02-05 LAB — CBC WITH DIFFERENTIAL/PLATELET
BASO%: 0.7 % (ref 0.0–2.0)
BASOS ABS: 0.1 10*3/uL (ref 0.0–0.1)
EOS%: 4 % (ref 0.0–7.0)
Eosinophils Absolute: 0.3 10*3/uL (ref 0.0–0.5)
HCT: 36.5 % — ABNORMAL LOW (ref 38.4–49.9)
HGB: 12.1 g/dL — ABNORMAL LOW (ref 13.0–17.1)
LYMPH%: 24.2 % (ref 14.0–49.0)
MCH: 29.2 pg (ref 27.2–33.4)
MCHC: 33 g/dL (ref 32.0–36.0)
MCV: 88.4 fL (ref 79.3–98.0)
MONO#: 0.7 10*3/uL (ref 0.1–0.9)
MONO%: 8.8 % (ref 0.0–14.0)
NEUT#: 5.2 10*3/uL (ref 1.5–6.5)
NEUT%: 62.3 % (ref 39.0–75.0)
PLATELETS: 175 10*3/uL (ref 140–400)
RBC: 4.13 10*6/uL — ABNORMAL LOW (ref 4.20–5.82)
RDW: 14 % (ref 11.0–14.6)
WBC: 8.4 10*3/uL (ref 4.0–10.3)
lymph#: 2 10*3/uL (ref 0.9–3.3)

## 2015-02-05 LAB — COMPREHENSIVE METABOLIC PANEL (CC13)
ALBUMIN: 4.3 g/dL (ref 3.5–5.0)
ALK PHOS: 60 U/L (ref 40–150)
ALT: 17 U/L (ref 0–55)
ANION GAP: 12 meq/L — AB (ref 3–11)
AST: 14 U/L (ref 5–34)
BUN: 87.9 mg/dL — ABNORMAL HIGH (ref 7.0–26.0)
CALCIUM: 9.7 mg/dL (ref 8.4–10.4)
CHLORIDE: 101 meq/L (ref 98–109)
CO2: 24 mEq/L (ref 22–29)
CREATININE: 2.9 mg/dL — AB (ref 0.7–1.3)
EGFR: 24 mL/min/{1.73_m2} — ABNORMAL LOW (ref >90)
Glucose: 191 mg/dl — ABNORMAL HIGH (ref 70–140)
Potassium: 4.4 mEq/L (ref 3.5–5.1)
SODIUM: 137 meq/L (ref 136–145)
Total Bilirubin: 0.35 mg/dL (ref 0.20–1.20)
Total Protein: 7.6 g/dL (ref 6.4–8.3)

## 2015-02-05 MED ORDER — SODIUM CHLORIDE 0.9 % IJ SOLN
10.0000 mL | INTRAMUSCULAR | Status: DC | PRN
  Administered 2015-02-05: 10 mL via INTRAVENOUS
  Filled 2015-02-05: qty 10

## 2015-02-05 MED ORDER — HEPARIN SOD (PORK) LOCK FLUSH 100 UNIT/ML IV SOLN
500.0000 [IU] | Freq: Once | INTRAVENOUS | Status: AC
  Administered 2015-02-05: 500 [IU] via INTRAVENOUS
  Filled 2015-02-05: qty 5

## 2015-02-05 NOTE — Progress Notes (Signed)
Hematology and Oncology Follow Up Visit  [name] [name] [id number] 1946-01-03 69 y.o. 02/05/2015 10:28 AM   Principle Diagnosis: 69 year old with stage IIIA SLL diagnosed in 06/2012. He presented with abdominal pain and diffuse lymphadenopathy.   Prior Therapy:  He is S/P lymph node biopsy that was done on 07/13/2012.  He is S/P 6 cycles of B-R started in 07/2012. He completed cycle 6 on 12/2012.    Current therapy: Observation and follow up.   Interim History: [name] [name] presents today for a follow up visit by him self. Since his last visit, he was hospitalized in March 2016 for congestive heart failure. He received aggressive diuresis and currently oxygen dependent. He is participating in cardiac rehabilitation which have helped in his respiratory status. He reports his respiratory status has been stable and his breathing properly with oxygen. He has not reported any pain or increased lower extremity edema. He is on diabetics and used intermittently. He still have some lethargy and some dyspnea on exertion. He continues to gain weight and have contributed to his overall shortness of breath.   He is not reporting any abdominal pain or early satiety. He is not reporting any nausea or vomiting is not reporting any fevers or chills. No evidence to suggest relapsed disease. His appetite is reasonable. He has not reported any lymphadenopathy or petechiae. He has not reported any frequency urgency or hesitancy. Does not report any constitutional symptoms. He does not report any headaches or blurry vision or syncope. Rest of his review of systems unremarkable  Medications: I have reviewed the patient's current medications. Current Outpatient Prescriptions  Medication Sig Dispense Refill  . albuterol (PROVENTIL HFA;VENTOLIN HFA) 108 (90 BASE) MCG/ACT inhaler Inhale 2 puffs into the lungs every 6 (six) hours as needed for wheezing or shortness of breath. 1 Inhaler 11  . AMBULATORY NON FORMULARY  MEDICATION Home O2 @@ 3 LMP for 8 hrs at night    . AMBULATORY NON FORMULARY MEDICATION CPAP    . aspirin EC 81 MG tablet Take 81 mg by mouth daily.    Marland Kitchen atorvastatin (LIPITOR) 20 MG tablet Take 1 tablet (20 mg total) by mouth daily at 6 PM. 90 tablet 3  . Blood Glucose Monitoring Suppl (ACCU-CHEK AVIVA PLUS) W/DEVICE KIT Use to check blood sugars twice a day Dx 250.00 1 kit 0  . carvedilol (COREG) 12.5 MG tablet Take 1 tablet (12.5 mg total) by mouth 2 (two) times daily. (Patient not taking: Reported on 01/15/2015) 180 tablet 3  . glucose blood (ACCU-CHEK AVIVA PLUS) test strip Use to check blood sugars five times a day Dx E11.9 150 each 3  . insulin glargine (LANTUS) 100 UNIT/ML injection Inject 0.4 mLs (40 Units total) into the skin at bedtime. (Patient not taking: Reported on 01/14/2015) 40 mL 3  . Insulin Glargine (TOUJEO SOLOSTAR) 300 UNIT/ML SOPN Inject 40 Units into the skin at bedtime. 4.5 mL 3  . insulin lispro (HUMALOG KWIKPEN) 100 UNIT/ML KiwkPen Inject 0.2 mLs (20 Units total) into the skin daily. If cbg is <150 take 10 units, if >150 take 15 15 mL 3  . Insulin Pen Needle (BD PEN NEEDLE NANO U/F) 32G X 4 MM MISC Use as directed 1 per day   250.02 100 each 11  . lisinopril (PRINIVIL,ZESTRIL) 10 MG tablet Take 1 tablet (10 mg total) by mouth daily. 30 tablet 11  . metolazone (ZAROXOLYN) 5 MG tablet Take 1 tablet as needed if weight goes about 305 lbs  15 tablet 6  . sitaGLIPtin (JANUVIA) 50 MG tablet Take 25 mg by mouth daily.     Marland Kitchen torsemide (DEMADEX) 20 MG tablet Take 2 tablets (40 mg total) by mouth 2 (two) times daily. 120 tablet 6   No current facility-administered medications for this visit.   Facility-Administered Medications Ordered in Other Visits  Medication Dose Route Frequency Provider Last Rate Last Dose  . sodium chloride 0.9 % injection 10 mL  10 mL Intravenous PRN [name] Portela, MD   10 mL at 04/25/13 1100     Allergies:  Allergies  Allergen Reactions  . Other  Hives    Poison ivy.     Past Medical History, Surgical history, Social history, and Family History were reviewed and updated.    Physical Exam: Blood pressure 94/39, pulse 81, temperature 97.6 F (36.4 C), temperature source Oral, resp. rate 18, height 6' 3" (1.905 m), weight 306 lb 12.8 oz (139.164 kg), SpO2 100 %. ECOG: 1 General appearance: alert awake gentleman. Obese without any respiratory distress today. Head: Normocephalic, without obvious abnormality Neck: no adenopathy Lymph nodes: Cervical, supraclavicular, and axillary nodes normal. Heart:regular rate and rhythm, S1, S2 normal, no murmur, click, rub or gallop Lung:chest clear, no wheezing, rales, normal symmetric air entry Abdomen: soft, non-tender, without masses or organomegaly EXT:no erythema, induration, or nodules. Trace edema noted on his bilateral lower extremities. No lymph nodes noted on exam today.   Lab Results: Lab Results  Component Value Date   WBC 8.4 02/05/2015   HGB 12.1* 02/05/2015   HCT 36.5* 02/05/2015   MCV 88.4 02/05/2015   PLT 175 02/05/2015     Chemistry      Component Value Date/Time   NA 138 11/27/2014 1018   NA 139 08/19/2014 0817   K 4.3 11/27/2014 1018   K 4.4 08/19/2014 0817   CL 102 11/27/2014 1018   CL 106 12/19/2012 0832   CO2 27 11/27/2014 1018   CO2 25 08/19/2014 0817   BUN 61* 11/27/2014 1018   BUN 45.5* 08/19/2014 0817   CREATININE 1.88* 11/27/2014 1018   CREATININE 1.7* 08/19/2014 0817      Component Value Date/Time   CALCIUM 9.7 11/27/2014 1018   CALCIUM 8.8 08/19/2014 0817   ALKPHOS 63 11/27/2014 1018   ALKPHOS 67 08/19/2014 0817   AST 16 11/27/2014 1018   AST 13 08/19/2014 0817   ALT 17 11/27/2014 1018   ALT 15 08/19/2014 0817   BILITOT 0.5 11/27/2014 1018   BILITOT 0.50 08/19/2014 0817         Impression and Plan:  This is a 69 year old gentleman with the following issues: 1. Chronic lymphocytic leukemia/small lymphocytic lymphoma. He is S/P B-R  chemotherapy. He completed chemotherapy without complications in 03/8263.   CT on 08/19/2014 was reviewed again today and for the most part show stable disease. He has some slight changes in his thoracic and cervical adenopathy but the overall size is very small and does not warrant any treatment at this time. The plan is to continue with observation and surveillance and repeat imaging studies in 6 months.  2. Abdominal pain: This have resolved at this time.   3. Shortness of breath:  Related to cardiac and pulmonary issues. He is oxygen dependent and received intermittent diuresis as needed.  4. Follow up: In 6 months after a scan. He will continue port flush every 2 months.       SHADAD,FIRAS 7/21/201610:28 AM

## 2015-02-05 NOTE — Telephone Encounter (Signed)
Pt confirmed labs/ov per 07/21 POF, gave pt AVS and Calendar.... KJ

## 2015-02-05 NOTE — Patient Instructions (Signed)

## 2015-02-10 ENCOUNTER — Encounter: Payer: Self-pay | Admitting: Internal Medicine

## 2015-02-10 ENCOUNTER — Encounter (HOSPITAL_COMMUNITY): Payer: Commercial Managed Care - HMO

## 2015-02-10 ENCOUNTER — Ambulatory Visit (INDEPENDENT_AMBULATORY_CARE_PROVIDER_SITE_OTHER): Payer: Commercial Managed Care - HMO | Admitting: Internal Medicine

## 2015-02-10 VITALS — BP 98/58 | HR 78 | Temp 98.6°F | Wt 307.8 lb

## 2015-02-10 DIAGNOSIS — Z23 Encounter for immunization: Secondary | ICD-10-CM

## 2015-02-10 DIAGNOSIS — R21 Rash and other nonspecific skin eruption: Secondary | ICD-10-CM

## 2015-02-10 DIAGNOSIS — Z0000 Encounter for general adult medical examination without abnormal findings: Secondary | ICD-10-CM | POA: Diagnosis not present

## 2015-02-10 MED ORDER — TRIAMCINOLONE ACETONIDE 0.1 % EX CREA: 1.0000 "application " | TOPICAL_CREAM | Freq: Two times a day (BID) | CUTANEOUS | Status: DC

## 2015-02-10 NOTE — Progress Notes (Signed)
Pre visit review using our clinic review tool, if applicable. No additional management support is needed unless otherwise documented below in the visit note. 

## 2015-02-10 NOTE — Progress Notes (Signed)
Subjective:    Patient ID: [name] [name], male    DOB: 1946/02/26, 69 y.o.   MRN: [id number]  HPI  Here for wellness and f/u;  Overall doing ok;  Pt denies Chest pain, worsening SOB, DOE, wheezing, orthopnea, PND, worsening LE edema, palpitations, dizziness or syncope.  Pt denies neurological change such as new headache, facial or extremity weakness.  Pt denies polydipsia, polyuria, or low sugar symptoms. Pt states overall good compliance with treatment and medications, good tolerability, and has been trying to follow appropriate diet.  Pt denies worsening depressive symptoms, suicidal ideation or panic. No fever, night sweats, wt loss, loss of appetite, or other constitutional symptoms.  Pt states good ability with ADL's, has low fall risk, home safety reviewed and adequate, no other significant changes in hearing or vision, and only occasionally active with exercise, now on home o2, Sees oncology/endo/nephrology and pulm. Stopped [place] due to cost, gets insulin from [place] for $8, sees Dr Cammy Copa for DM  In remission from lyjmphoma. Will be assigned new pulm as Dr Gwenette Greet has left the practice. Has also seen Dr Acie Fredrickson, then Dr Jeffie Pollock, now on home o2.  305 lbs is considered dry wt, to take metolazone prn for increased wt.  Also involved with card rehab . Past Medical History  Diagnosis Date  . Ischemic cardiomyopathy   . Hypertension   . Hypercholesterolemia   . Lower GI bleeding 1980's    "when I was drinking alot" (06/06/2012)  . Chronic combined systolic and diastolic CHF (congestive heart failure) 07/10/2012    a. EF 60-63%, + diastolic dysfunction by echo 12/2012.  Marland Kitchen GERD (gastroesophageal reflux disease) 07/12/2012  . Coronary artery disease     a. Cath 05/2012 with CTO of RCA; s/p BMS to severe OM-1 stenosis.  Marland Kitchen History of alcoholism     "haven't had a drink in 23 yr" (09/12/2014)  . Moderate aortic stenosis     a. Mod by echo 12/2012.  Marland Kitchen Pancreatitis   . Diverticulosis   .  Personal history of rectal adenoma 05/28/2013  . Dietary noncompliance   . Morbid obesity   . Lymphoma     a. 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  . Shortness of breath   . Myocardial infarction 11/13  . Pneumonia 05/2012    "right after stent was put in"  . OSA on CPAP   . On home oxygen therapy     "3L; pretty much last 1 1/2 weeks" (09/12/2014)  . Type II diabetes mellitus   . History of stomach ulcers     "when I drank alot; not bothered w/them since I quit drinking"  . Arthritis     "both shoulders" (09/12/2014)   Past Surgical History  Procedure Laterality Date  . Coronary angioplasty with stent placement  06/06/2012    "1"  . Lymph gland excision  07/13/2012    Procedure: CERVICAL LYMPH GLAND EXCISION;  Surgeon: Haywood Lasso, MD;  Location: [facility];  Service: General;  Laterality: Right;  . Portacath placement  07/30/2012    Procedure: INSERTION PORT-A-CATH;  Surgeon: Haywood Lasso, MD;  Location: [facility];  Service: General;  Laterality: Right;  . Colonoscopy N/A 05/28/2013    Procedure: COLONOSCOPY;  Surgeon: Gatha Mayer, MD;  Location: [facility];  Service: Endoscopy;  Laterality: N/A;  . Left and right heart catheterization with coronary angiogram N/A 06/06/2012    Procedure: LEFT  AND RIGHT HEART CATHETERIZATION WITH CORONARY ANGIOGRAM;  Surgeon: Pixie Casino, MD;  Location[facility] CATH LAB;  Service: Cardiovascular;  Laterality: N/A;  . Percutaneous coronary stent intervention (pci-s)  06/06/2012    Procedure: PERCUTANEOUS CORONARY STENT INTERVENTION (PCI-S);  Surgeon: Troy Sine, MD;  Location: [facility] CATH LAB;  Service: Cardiovascular;;  . Cardiac catheterization  1//20/2013    71french atrerial sheath    reports that he quit smoking about 18 months ago. His smoking use included Cigarettes. He has a 106 pack-year smoking history. He has never used smokeless  tobacco. He reports that he does not drink alcohol or use illicit drugs. family history includes Colon cancer in his daughter; Diabetes in his father; Heart disease in his father; Osteoarthritis in his father and mother. Allergies  Allergen Reactions  . Other Hives    Poison ivy.    Current Outpatient Prescriptions on File Prior to Visit  Medication Sig Dispense Refill  . albuterol (PROVENTIL HFA;VENTOLIN HFA) 108 (90 BASE) MCG/ACT inhaler Inhale 2 puffs into the lungs every 6 (six) hours as needed for wheezing or shortness of breath. 1 Inhaler 11  . AMBULATORY NON FORMULARY MEDICATION Home O2 @@ 3 LMP for 8 hrs at night    . AMBULATORY NON FORMULARY MEDICATION CPAP    . aspirin EC 81 MG tablet Take 81 mg by mouth daily.    Marland Kitchen atorvastatin (LIPITOR) 20 MG tablet Take 1 tablet (20 mg total) by mouth daily at 6 PM. 90 tablet 3  . Blood Glucose Monitoring Suppl (ACCU-CHEK AVIVA PLUS) W/DEVICE KIT Use to check blood sugars twice a day Dx 250.00 1 kit 0  . carvedilol (COREG) 12.5 MG tablet Take 1 tablet (12.5 mg total) by mouth 2 (two) times daily. 180 tablet 3  . glucose blood (ACCU-CHEK AVIVA PLUS) test strip Use to check blood sugars five times a day Dx E11.9 150 each 3  . Insulin Glargine (TOUJEO SOLOSTAR) 300 UNIT/ML SOPN Inject 40 Units into the skin at bedtime. 4.5 mL 3  . insulin lispro (HUMALOG KWIKPEN) 100 UNIT/ML KiwkPen Inject 0.2 mLs (20 Units total) into the skin daily. If cbg is <150 take 10 units, if >150 take 15 15 mL 3  . Insulin Pen Needle (BD PEN NEEDLE NANO U/F) 32G X 4 MM MISC Use as directed 1 per day   250.02 100 each 11  . lisinopril (PRINIVIL,ZESTRIL) 10 MG tablet Take 1 tablet (10 mg total) by mouth daily. 30 tablet 11  . metolazone (ZAROXOLYN) 5 MG tablet Take 1 tablet as needed if weight goes about 305 lbs 15 tablet 6  . torsemide (DEMADEX) 20 MG tablet Take 2 tablets (40 mg total) by mouth 2 (two) times daily. 120 tablet 6   Current Facility-Administered Medications  on File Prior to Visit  Medication Dose Route Frequency Provider Last Rate Last Dose  . sodium chloride 0.9 % injection 10 mL  10 mL Intravenous PRN [name], MD   10 mL at 04/25/13 1100    Review of Systems  Constitutional: Negative for increased diaphoresis, other activity, appetite or siginficant weight change other than noted HENT: Negative for worsening hearing loss, ear pain, facial swelling, mouth sores and neck stiffness.   Eyes: Negative for other worsening pain, redness or visual disturbance.  Respiratory: Negative for shortness of breath and wheezing  Cardiovascular: Negative for chest pain and palpitations.  Gastrointestinal: Negative for diarrhea, blood in stool, abdominal distention or other pain Genitourinary: Negative for hematuria, flank  pain or change in urine volume.  Musculoskeletal: Negative for myalgias or other joint complaints.  Skin: Negative for color change and wound or drainage.  Neurological: Negative for syncope and numbness. other than noted Hematological: Negative for adenopathy. or other swelling Psychiatric/Behavioral: Negative for hallucinations, SI, self-injury, decreased concentration or other worsening agitation.       Objective:   Physical Exam BP 98/58 mmHg  Pulse 78  Temp(Src) 98.6 F (37 C)  Wt 307 lb 12 oz (139.594 kg)  SpO2 98% on home o2 2L  VS noted,  Constitutional: Pt is oriented to person, place, and time. Appears well-developed and well-nourished, in no significant distress Head: Normocephalic and atraumatic.  Right Ear: External ear normal.  Left Ear: External ear normal.  Nose: Nose normal.  Mouth/Throat: Oropharynx is clear and moist.  Eyes: Conjunctivae and EOM are normal. Pupils are equal, round, and reactive to light.  Neck: Normal range of motion. Neck supple. No JVD present. No tracheal deviation present or significant neck LA or mass Cardiovascular: Normal rate, regular rhythm, normal heart sounds and intact  distal pulses.   Pulmonary/Chest: Effort normal and breath sounds decreased without rales or wheezing  Abdominal: Soft. Bowel sounds are normal. NT. No HSM  Musculoskeletal: Normal range of motion. Exhibits no edema.  Lymphadenopathy:  Has no cervical adenopathy.  Neurological: Pt is alert and oriented to person, place, and time. Pt has normal reflexes. No cranial nerve deficit. Motor grossly intact Skin: Skin is warm and dry. No rash noted. except to necklace area anterior neck  Psychiatric:  Has normal mood and affect. Behavior is normal. ]     Assessment & Plan:

## 2015-02-10 NOTE — Addendum Note (Signed)
Addended by: Ander Slade on: 02/10/2015 11:58 AM   Modules accepted: Orders

## 2015-02-10 NOTE — Assessment & Plan Note (Signed)
?   Eczema - for triam cr prn,  to f/u any worsening symptoms or concerns

## 2015-02-10 NOTE — Patient Instructions (Addendum)
You had the Pneumovax pneumonia shot today  Please take all new medication as prescribed - the cream  Please continue all other medications as before, and refills have been done if requested.  Please have the pharmacy call with any other refills you may need.  Please continue your efforts at being more active, low cholesterol diet, and weight control.  You are otherwise up to date with prevention measures today.  Please keep your appointments with your specialists as you may have planned  Please return in 6 months, or sooner if needed, or as needed if you plan to follow at the [place]

## 2015-02-10 NOTE — Assessment & Plan Note (Signed)
.  Overall doing well, age appropriate education and counseling updated, referrals for preventative services and immunizations addressed, dietary and smoking counseling addressed, most recent labs reviewed.  I have personally reviewed and have noted:  1) the patient's medical and social history 2) The pt's use of alcohol, tobacco, and illicit drugs 3) The patient's current medications and supplements 4) Functional ability including ADL's, fall risk, home safety risk, hearing and visual impairment 5) Diet and physical activities 6) Evidence for depression or mood disorder 7) The patient's height, weight, and BMI have been recorded in the chart  I have made referrals, and provided counseling and education based on review of the above Declines furhter labs today Lab Results  Component Value Date   WBC 8.4 02/05/2015   HGB 12.1* 02/05/2015   HCT 36.5* 02/05/2015   PLT 175 02/05/2015   GLUCOSE 191* 02/05/2015   CHOL 141 05/19/2014   TRIG 149.0 05/19/2014   HDL 31.50* 05/19/2014   LDLCALC 80 05/19/2014   ALT 17 02/05/2015   AST 14 02/05/2015   NA 137 02/05/2015   K 4.4 02/05/2015   CL 102 11/27/2014   CREATININE 2.9* 02/05/2015   BUN 87.9 Result Confirmed by Automated Dilution.* 02/05/2015   CO2 24 02/05/2015   TSH 0.487 09/12/2014   PSA 0.58 02/04/2014   INR 1.15 07/09/2012   HGBA1C 9.1* 11/27/2014   MICROALBUR 0.3 02/04/2014

## 2015-02-12 ENCOUNTER — Encounter (HOSPITAL_COMMUNITY): Admission: RE | Admit: 2015-02-12 | Payer: Commercial Managed Care - HMO | Source: Ambulatory Visit

## 2015-02-12 ENCOUNTER — Other Ambulatory Visit: Payer: Commercial Managed Care - HMO

## 2015-02-17 ENCOUNTER — Encounter (HOSPITAL_COMMUNITY)
Admission: RE | Admit: 2015-02-17 | Discharge: 2015-02-17 | Disposition: A | Discharge status: Home / Self Care | Payer: Commercial Managed Care - HMO | Source: Ambulatory Visit | Attending: Internal Medicine | Admitting: Internal Medicine

## 2015-02-17 DIAGNOSIS — G4730 Sleep apnea, unspecified: Secondary | ICD-10-CM | POA: Insufficient documentation

## 2015-02-17 DIAGNOSIS — I5042 Chronic combined systolic (congestive) and diastolic (congestive) heart failure: Secondary | ICD-10-CM | POA: Insufficient documentation

## 2015-02-17 NOTE — Progress Notes (Signed)
Today, [name] exercised at [employer]. Service time was from 1030 to 1215.  The patient exercised by performing aerobic, strengthening, and stretching exercises. Oxygen saturation, heart rate, blood pressure, rate of perceived exertion, and shortness of breath were all monitored before, during, and after exercise. [name] presented with no problems at today's exercise session.  The patient did not have an increase in workload intensity during today's exercise session.  Pre-exercise vitals: . Weight kg: 138.7 . Liters of O2: 3 . SpO2: 97 . HR: 82 . BP: 110/72 . CBG: 159  Exercise vitals: . Highest heartrate:  101 . Lowest oxygen saturation: 97 . Highest blood pressure: 96/64 . Liters of 02: 3  Post-exercise vitals: . SpO2: 100 . HR: 88 . BP: 96/54 . Liters of O2: 3 . CBG: 120 Dr. Brand Males, Medical Director Dr. Dyann Kief is immediately available during today's Pulmonary Rehab session for [name] [name] on 02/17/2015  at 1030 class time.  Marland Kitchen

## 2015-02-18 ENCOUNTER — Telehealth: Payer: Self-pay | Admitting: Endocrinology

## 2015-02-18 ENCOUNTER — Ambulatory Visit: Payer: Commercial Managed Care - HMO | Admitting: Endocrinology

## 2015-02-18 NOTE — Telephone Encounter (Signed)
Letter mailed

## 2015-02-18 NOTE — Telephone Encounter (Signed)
Patient no showed today's appt. Please advise on how to follow up. °A. No follow up necessary. °B. Follow up urgent. Contact patient immediately. °C. Follow up necessary. Contact patient and schedule visit in ___ days. °D. Follow up advised. Contact patient and schedule visit in ____weeks. ° °

## 2015-02-19 ENCOUNTER — Encounter (HOSPITAL_COMMUNITY)
Admission: RE | Admit: 2015-02-19 | Discharge: 2015-02-19 | Disposition: A | Discharge status: Home / Self Care | Payer: Commercial Managed Care - HMO | Source: Ambulatory Visit | Attending: Internal Medicine | Admitting: Internal Medicine

## 2015-02-19 DIAGNOSIS — I5042 Chronic combined systolic (congestive) and diastolic (congestive) heart failure: Secondary | ICD-10-CM | POA: Diagnosis not present

## 2015-02-19 NOTE — Progress Notes (Signed)
Today, Dahir exercised at [employer]. Service time was from 1030 to 1220.  The patient exercised by performing aerobic, strengthening, and stretching exercises. Oxygen saturation, heart rate, blood pressure, rate of perceived exertion, and shortness of breath were all monitored before, during, and after exercise. [name] [name] presented with no problems at today's exercise session. He attended risk factor reduction class today.  The patient did not have an increase in workload intensity during today's exercise session.  Pre-exercise vitals: . Weight kg: 139.0 . Liters of O2: 3 . SpO2: 99 . HR: 90 . BP: 94/50 . CBG: 157  Exercise vitals: . Highest heartrate:  110 . Lowest oxygen saturation: 98 . Highest blood pressure: 120/60 . Liters of 02: 2  Post-exercise vitals: . SpO2: 98 . HR: 76 . BP: 114/62 . Liters of O2: 3 . CBG: 136 Dr. Brand Males, Medical Director Dr. Frederic Jericho is immediately available during today's Pulmonary Rehab session for [name] on 02/19/2015  at 1030 class time.  Marland Kitchen

## 2015-02-24 ENCOUNTER — Encounter (HOSPITAL_COMMUNITY)
Admission: RE | Admit: 2015-02-24 | Discharge: 2015-02-24 | Disposition: A | Discharge status: Home / Self Care | Payer: Commercial Managed Care - HMO | Source: Ambulatory Visit | Attending: Internal Medicine | Admitting: Internal Medicine

## 2015-02-24 DIAGNOSIS — I5042 Chronic combined systolic (congestive) and diastolic (congestive) heart failure: Secondary | ICD-10-CM | POA: Diagnosis not present

## 2015-02-24 NOTE — Progress Notes (Signed)
Today, Hercules exercised at [employer]. Service time was from 1030 to 1210.  The patient exercised by performing aerobic, strengthening, and stretching exercises. Oxygen saturation, heart rate, blood pressure, rate of perceived exertion, and shortness of breath were all monitored before, during, and after exercise. Prentis presented with no problems at today's exercise session.  The patient did not have an increase in workload intensity during today's exercise session.  Pre-exercise vitals: . Weight kg: 138.2 . Liters of O2: 2 . SpO2: 95 . HR: 90 . BP: 94/56 . CBG: 142  Exercise vitals: . Highest heartrate:  90 . Lowest oxygen saturation: 95 . Highest blood pressure: 102/60 . Liters of 02: 2  Post-exercise vitals: . SpO2: 96 . HR: 81 . BP: 92/46 . Liters of O2: 2 . CBG: 117 Dr. Brand Males, Medical Director Dr. Frederic Jericho is immediately available during today's Pulmonary Rehab session for WILLEM KLINGENSMITH on 02/24/2015  at 1030 class time.  Marland Kitchen

## 2015-03-05 ENCOUNTER — Encounter (HOSPITAL_COMMUNITY)
Admission: RE | Admit: 2015-03-05 | Discharge: 2015-03-05 | Disposition: A | Discharge status: Home / Self Care | Payer: Commercial Managed Care - HMO | Source: Ambulatory Visit | Attending: Internal Medicine | Admitting: Internal Medicine

## 2015-03-05 DIAGNOSIS — I5042 Chronic combined systolic (congestive) and diastolic (congestive) heart failure: Secondary | ICD-10-CM | POA: Diagnosis not present

## 2015-03-05 NOTE — Progress Notes (Signed)
[name] [name] completed a Six-Minute Walk Test on 03/05/15 . [name] [name] walked 848 feet with 0 breaks.  The patient's lowest oxygen saturation was 94 %, highest heart rate was 126 bpm , and highest blood pressure was 128/84. The patient was on 3 liters of oxygen with a nasal cannula. Patient stated that nothing hindered their walk test.

## 2015-03-05 NOTE — Progress Notes (Signed)
Today, [name] exercised at [employer]. Service time was from 10:30am to 12:45pm.  The patient exercised by performing aerobic, strengthening, and stretching exercises. Oxygen saturation, heart rate, blood pressure, rate of perceived exertion, and shortness of breath were all monitored before, during, and after exercise. [name] presented with no problems at today's exercise session. The patient attended education today with Derek Mound the Nutritionist.  The patient did not have an increase in workload intensity during today's exercise session.  Pre-exercise vitals: . Weight kg: 137.7 . Liters of O2: 2 . SpO2: 97 . HR: 91 . BP: 120/56 . CBG: 116  Exercise vitals: . Highest heartrate:  84 . Lowest oxygen saturation: 97 . Highest blood pressure: 134/70 . Liters of 02: 2  Post-exercise vitals: . SpO2: 98 . HR: 80 . BP: 110/62 . Liters of O2: 2 . CBG: 99  Dr. Brand Males, Medical Director Dr. Sloan Leiter is immediately available during today's Pulmonary Rehab session for [name] [name] on 03/05/15 at 10:30am class time.

## 2015-03-17 ENCOUNTER — Ambulatory Visit: Payer: Commercial Managed Care - HMO | Admitting: Cardiovascular Disease

## 2015-03-17 NOTE — Telephone Encounter (Signed)
Pt is not going to return because he is going to the [place]

## 2015-03-18 ENCOUNTER — Encounter: Payer: Self-pay | Admitting: Internal Medicine

## 2015-03-24 NOTE — Progress Notes (Signed)
Pulmonary Rehab Discharge Note: [name] [name] has been discharged from pulmonary rehab after successfully completing 25 exercise and education sessions. [name] [name] increased his strength and stamina significantly as evidenced by his ability to walk an additional 287 feet on his discharge 6 min walk test as compared to his admission walk test. He met both personal goals, ability to raise his hands above his head and to have to stamina to do more yard work. He is now able to keep up his own lawn. He admits this program has been a Sports administrator for him. He has now [facility] and making progress with his weight and diabetes goals. He plans to continue to walk at home for exercise.

## 2015-04-08 ENCOUNTER — Ambulatory Visit (HOSPITAL_BASED_OUTPATIENT_CLINIC_OR_DEPARTMENT_OTHER): Payer: Commercial Managed Care - HMO

## 2015-04-08 VITALS — BP 131/76 | HR 75 | Temp 97.3°F | Resp 19

## 2015-04-08 DIAGNOSIS — Z452 Encounter for adjustment and management of vascular access device: Secondary | ICD-10-CM

## 2015-04-08 DIAGNOSIS — Z95828 Presence of other vascular implants and grafts: Secondary | ICD-10-CM

## 2015-04-08 DIAGNOSIS — C9110 Chronic lymphocytic leukemia of B-cell type not having achieved remission: Secondary | ICD-10-CM

## 2015-04-08 MED ORDER — HEPARIN SOD (PORK) LOCK FLUSH 100 UNIT/ML IV SOLN
500.0000 [IU] | Freq: Once | INTRAVENOUS | Status: AC
  Administered 2015-04-08: 500 [IU] via INTRAVENOUS
  Filled 2015-04-08: qty 5

## 2015-04-08 MED ORDER — SODIUM CHLORIDE 0.9 % IJ SOLN
10.0000 mL | INTRAMUSCULAR | Status: DC | PRN
  Administered 2015-04-08: 10 mL via INTRAVENOUS
  Filled 2015-04-08: qty 10

## 2015-04-08 NOTE — Patient Instructions (Signed)

## 2015-06-04 ENCOUNTER — Telehealth: Payer: Self-pay | Admitting: Oncology

## 2015-06-04 NOTE — Telephone Encounter (Signed)
Returned patient call re cancelling appointments. Per patient cancel all appointments due to he has had a lapse in insurance and is currently  going to the [place]. Message to FS. Appointments cxd.

## 2015-06-29 ENCOUNTER — Telehealth (HOSPITAL_COMMUNITY): Payer: Self-pay | Admitting: Oncology

## 2015-07-16 ENCOUNTER — Other Ambulatory Visit: Payer: Self-pay

## 2015-07-16 NOTE — Patient Outreach (Signed)
[facility]) Care Management  07/16/2015  [name] [name] May 27, 1946 [id number]   Telephone Screen  Referral Date: 07/15/15 Referral Source: [facility] tier 4 list Referral Reason:DM,COPD, 2 ED visits and 2 hospital admits  Outreach attempt # 1 to patient. Patient reached. Patient confirmed that he is using [facility] for all medical care. He states that he is no longer seeing Dr. Jenny Reichmann. He reports that he is managing his care without any needs or concerns. Patient appreciative of call but states he does not need services at this time.  Plan: RN CM will notify [facility] administrative assistant of case closure. RN CM will send MD case closure letter.  [name] Montgomery, RN,BSN,CCM [place] Management Telephonic Care Management Coordinator Direct Phone: [phone] Toll Free: [phone] Fax: [phone]

## 2015-07-28 ENCOUNTER — Encounter (HOSPITAL_COMMUNITY): Payer: Self-pay | Admitting: *Deleted

## 2015-07-28 ENCOUNTER — Emergency Department (INDEPENDENT_AMBULATORY_CARE_PROVIDER_SITE_OTHER): Payer: Commercial Managed Care - HMO

## 2015-07-28 ENCOUNTER — Emergency Department (INDEPENDENT_AMBULATORY_CARE_PROVIDER_SITE_OTHER)
Admission: EM | Admit: 2015-07-28 | Discharge: 2015-07-28 | Disposition: A | Discharge status: Home / Self Care | Payer: Commercial Managed Care - HMO | Source: Home / Self Care | Attending: Emergency Medicine | Admitting: Emergency Medicine

## 2015-07-28 DIAGNOSIS — J069 Acute upper respiratory infection, unspecified: Secondary | ICD-10-CM

## 2015-07-28 DIAGNOSIS — J3489 Other specified disorders of nose and nasal sinuses: Secondary | ICD-10-CM

## 2015-07-28 DIAGNOSIS — J9801 Acute bronchospasm: Secondary | ICD-10-CM

## 2015-07-28 MED ORDER — PREDNISONE 50 MG PO TABS: ORAL_TABLET | ORAL | Status: DC

## 2015-07-28 NOTE — ED Notes (Signed)
Pt  Reports   Body  Aches       Cough         Productive             Cough   And  Congested            As    With       History  Of   Copd

## 2015-07-28 NOTE — Discharge Instructions (Signed)
Bronchospasm, Adult Part of your cough is due to bronchospasm. Start using your albuterol inhaler or nebulizer every 4-6 hours as needed. Also take the Claritin or Allegra to help with drainage. A bronchospasm is a spasm or tightening of the airways going into the lungs. During a bronchospasm breathing becomes more difficult because the airways get smaller. When this happens there can be coughing, a whistling sound when breathing (wheezing), and difficulty breathing. Bronchospasm is often associated with asthma, but not all patients who experience a bronchospasm have asthma. CAUSES  A bronchospasm is caused by inflammation or irritation of the airways. The inflammation or irritation may be triggered by:   Allergies (such as to animals, pollen, food, or mold). Allergens that cause bronchospasm may cause wheezing immediately after exposure or many hours later.   Infection. Viral infections are believed to be the most common cause of bronchospasm.   Exercise.   Irritants (such as pollution, cigarette smoke, strong odors, aerosol sprays, and paint fumes).   Weather changes. Winds increase molds and pollens in the air. Rain refreshes the air by washing irritants out. Cold air may cause inflammation.   Stress and emotional upset.  SIGNS AND SYMPTOMS   Wheezing.   Excessive nighttime coughing.   Frequent or severe coughing with a simple cold.   Chest tightness.   Shortness of breath.  DIAGNOSIS  Bronchospasm is usually diagnosed through a history and physical exam. Tests, such as chest X-rays, are sometimes done to look for other conditions. TREATMENT   Inhaled medicines can be given to open up your airways and help you breathe. The medicines can be given using either an inhaler or a nebulizer machine.  Corticosteroid medicines may be given for severe bronchospasm, usually when it is associated with asthma. HOME CARE INSTRUCTIONS   Always have a plan prepared for seeking  medical care. Know when to call your health care provider and local emergency services (911 in the [place] Know where you can access local emergency care.  Only take medicines as directed by your health care provider.  If you were prescribed an inhaler or nebulizer machine, ask your health care provider to explain how to use it correctly. Always use a spacer with your inhaler if you were given one.  It is necessary to remain calm during an attack. Try to relax and breathe more slowly.  Control your home environment in the following ways:   Change your heating and air conditioning filter at least once a month.   Limit your use of fireplaces and wood stoves.  Do not smoke and do not allow smoking in your home.   Avoid exposure to perfumes and fragrances.   Get rid of pests (such as roaches and mice) and their droppings.   Throw away plants if you see mold on them.   Keep your house clean and dust free.   Replace carpet with wood, tile, or vinyl flooring. Carpet can trap dander and dust.   Use allergy-proof pillows, mattress covers, and box spring covers.   Wash bed sheets and blankets every week in hot water and dry them in a dryer.   Use blankets that are made of polyester or cotton.   Wash hands frequently. SEEK MEDICAL CARE IF:   You have muscle aches.   You have chest pain.   The sputum changes from clear or white to yellow, green, gray, or bloody.   The sputum you cough up gets thicker.   There are problems that may be  related to the medicine you are given, such as a rash, itching, swelling, or trouble breathing.  SEEK IMMEDIATE MEDICAL CARE IF:   You have worsening wheezing and coughing even after taking your prescribed medicines.   You have increased difficulty breathing.   You develop severe chest pain. MAKE SURE YOU:   Understand these instructions.  Will watch your condition.  Will get help right away if you are not doing well or get  worse.   This information is not intended to replace advice given to you by your health care provider. Make sure you discuss any questions you have with your health care provider.   Document Released: 07/07/2003 Document Revised: 07/25/2014 Document Reviewed: 12/24/2012 Elsevier Interactive Patient Education 2016 [employer].  How to Use an Inhaler Using your inhaler correctly is very important. Good technique will make sure that the medicine reaches your lungs.  HOW TO USE AN INHALER:  Take the cap off the inhaler.  If this is the first time using your inhaler, you need to prime it. Shake the inhaler for 5 seconds. Release four puffs into the air, away from your face. Ask your doctor for help if you have questions.  Shake the inhaler for 5 seconds.  Turn the inhaler so the bottle is above the mouthpiece.  Put your pointer finger on top of the bottle. Your thumb holds the bottom of the inhaler.  Open your mouth.  Either hold the inhaler away from your mouth (the width of 2 fingers) or place your lips tightly around the mouthpiece. Ask your doctor which way to use your inhaler.  Breathe out as much air as possible.  Breathe in and push down on the bottle 1 time to release the medicine. You will feel the medicine go in your mouth and throat.  Continue to take a deep breath in very slowly. Try to fill your lungs.  After you have breathed in completely, hold your breath for 10 seconds. This will help the medicine to settle in your lungs. If you cannot hold your breath for 10 seconds, hold it for as long as you can before you breathe out.  Breathe out slowly, through pursed lips. Whistling is an example of pursed lips.  If your doctor has told you to take more than 1 puff, wait at least 15-30 seconds between puffs. This will help you get the best results from your medicine. Do not use the inhaler more than your doctor tells you to.  Put the cap back on the inhaler.  Follow the  directions from your doctor or from the inhaler package about cleaning the inhaler. If you use more than one inhaler, ask your doctor which inhalers to use and what order to use them in. Ask your doctor to help you figure out when you will need to refill your inhaler.  If you use a steroid inhaler, always rinse your mouth with water after your last puff, gargle and spit out the water. Do not swallow the water. GET HELP IF:  The inhaler medicine only partially helps to stop wheezing or shortness of breath.  You are having trouble using your inhaler.  You have some increase in thick spit (phlegm). GET HELP RIGHT AWAY IF:  The inhaler medicine does not help your wheezing or shortness of breath or you have tightness in your chest.  You have dizziness, headaches, or fast heart rate.  You have chills, fever, or night sweats.  You have a large increase of thick  spit, or your thick spit is bloody. MAKE SURE YOU:   Understand these instructions.  Will watch your condition.  Will get help right away if you are not doing well or get worse.   This information is not intended to replace advice given to you by your health care provider. Make sure you discuss any questions you have with your health care provider.   Document Released: 04/12/2008 Document Revised: 04/24/2013 Document Reviewed: 01/31/2013 Elsevier Interactive Patient Education 2016 Elsevier Inc.  Upper Respiratory Infection, Adult Part of your cough is coming from drainage. He may take Allegra or Claritin to minimize the drainage. Most upper respiratory infections (URIs) are caused by a virus. A URI affects the nose, throat, and upper air passages. The most common type of URI is often called "the common cold." HOME CARE   Take medicines only as told by your doctor.  Gargle warm saltwater or take cough drops to comfort your throat as told by your doctor.  Use a warm mist humidifier or inhale steam from a shower to increase air  moisture. This may make it easier to breathe.  Drink enough fluid to keep your pee (urine) clear or pale yellow.  Eat soups and other clear broths.  Have a healthy diet.  Rest as needed.  Go back to work when your fever is gone or your doctor says it is okay.  You may need to stay home longer to avoid giving your URI to others.  You can also wear a face mask and wash your hands often to prevent spread of the virus.  Use your inhaler more if you have asthma.  Do not use any tobacco products, including cigarettes, chewing tobacco, or electronic cigarettes. If you need help quitting, ask your doctor. GET HELP IF:  You are getting worse, not better.  Your symptoms are not helped by medicine.  You have chills.  You are getting more short of breath.  You have brown or red mucus.  You have yellow or brown discharge from your nose.  You have pain in your face, especially when you bend forward.  You have a fever.  You have puffy (swollen) neck glands.  You have pain while swallowing.  You have white areas in the back of your throat. GET HELP RIGHT AWAY IF:   You have very bad or constant:  Headache.  Ear pain.  Pain in your forehead, behind your eyes, and over your cheekbones (sinus pain).  Chest pain.  You have long-lasting (chronic) lung disease and any of the following:  Wheezing.  Long-lasting cough.  Coughing up blood.  A change in your usual mucus.  You have a stiff neck.  You have changes in your:  Vision.  Hearing.  Thinking.  Mood. MAKE SURE YOU:   Understand these instructions.  Will watch your condition.  Will get help right away if you are not doing well or get worse.   This information is not intended to replace advice given to you by your health care provider. Make sure you discuss any questions you have with your health care provider.   Document Released: 12/21/2007 Document Revised: 11/18/2014 Document Reviewed:  10/09/2013 Elsevier Interactive Patient Education [employer].

## 2015-07-28 NOTE — ED Provider Notes (Signed)
CSN: [id number]     Arrival date & time 07/28/15  1825 History   First MD Initiated Contact with Patient 07/28/15 1954     Chief Complaint  Patient presents with  . Generalized Body Aches   (Consider location/radiation/quality/duration/timing/severity/associated sxs/prior Treatment) HPI Comments: 70 year old morbidly obese male with multiple chronic medical problems presents to the urgent care with a cough, sore throat, PND since Christmas. He denies earache or fever. He is oxygen dependent. He is currently using his oxygen concentrator. His saturations are 93%. And he is currently afebrile. Currently awake, alert oriented and talkative and appearing in no acute distress. Past and current medical history includes ischemic cardiomyopathy, hypertension, chronic combined systolic and diastolic CHF, CAD, moderate aortic stenosis, history of pancreatitis, MI, PMH of pneumonia, OSA on CPAP, type 2 diabetes mellitus and alcoholism. Former smoker with a 106-pack-year history.   Past Medical History  Diagnosis Date  . Ischemic cardiomyopathy   . Hypertension   . Hypercholesterolemia   . Lower GI bleeding 1980's    "when I was drinking alot" (06/06/2012)  . Chronic combined systolic and diastolic CHF (congestive heart failure) ([place]) 07/10/2012    a. EF 17-35%, + diastolic dysfunction by echo 12/2012.  Marland Kitchen GERD (gastroesophageal reflux disease) 07/12/2012  . Coronary artery disease     a. Cath 05/2012 with CTO of RCA; s/p BMS to severe OM-1 stenosis.  Marland Kitchen History of alcoholism ([place])     "haven't had a drink in 23 yr" (09/12/2014)  . Moderate aortic stenosis     a. Mod by echo 12/2012.  Marland Kitchen Pancreatitis   . Diverticulosis   . Personal history of rectal adenoma 05/28/2013  . Dietary noncompliance   . Morbid obesity ([place])   . Lymphoma ([place])     a. 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  . Shortness of breath   . Myocardial  infarction ([place]) 11/13  . Pneumonia 05/2012    "right after stent was put in"  . OSA on CPAP   . On home oxygen therapy     "3L; pretty much last 1 1/2 weeks" (09/12/2014)  . Type II diabetes mellitus ([place])   . History of stomach ulcers     "when I drank alot; not bothered w/them since I quit drinking"  . Arthritis     "both shoulders" (09/12/2014)   Past Surgical History  Procedure Laterality Date  . Coronary angioplasty with stent placement  06/06/2012    "1"  . Lymph gland excision  07/13/2012    Procedure: CERVICAL LYMPH GLAND EXCISION;  Surgeon: Haywood Lasso, MD;  Location: [facility];  Service: General;  Laterality: Right;  . Portacath placement  07/30/2012    Procedure: INSERTION PORT-A-CATH;  Surgeon: Haywood Lasso, MD;  Location: [facility];  Service: General;  Laterality: Right;  . Colonoscopy N/A 05/28/2013    Procedure: COLONOSCOPY;  Surgeon: Gatha Mayer, MD;  Location: [facility];  Service: Endoscopy;  Laterality: N/A;  . Left and right heart catheterization with coronary angiogram N/A 06/06/2012    Procedure: LEFT AND RIGHT HEART CATHETERIZATION WITH CORONARY ANGIOGRAM;  Surgeon: Pixie Casino, MD;  Location[facility] CATH LAB;  Service: Cardiovascular;  Laterality: N/A;  . Percutaneous coronary stent intervention (pci-s)  06/06/2012    Procedure: PERCUTANEOUS CORONARY STENT INTERVENTION (PCI-S);  Surgeon: Troy Sine, MD;  Location: [facility] CATH LAB;  Service: Cardiovascular;;  . Cardiac catheterization  1//20/2013  85fench atrerial sheath   Family History  Problem Relation Age of Onset  . Osteoarthritis Mother   . Osteoarthritis Father   . Colon cancer Daughter   . Diabetes Father     entire family  . Heart disease Father    Social History  Substance Use Topics  . Smoking status: Former Smoker -- 2.00 packs/day for 53 years    Types: Cigarettes    Quit date: 07/18/2013  . Smokeless tobacco: Never Used  . Alcohol Use: No     Comment:  Hasn't drank since 11962(former alcoholic)    Review of Systems  Constitutional: Positive for activity change. Negative for fever, diaphoresis and fatigue.  HENT: Positive for congestion and postnasal drip. Negative for ear pain, facial swelling, rhinorrhea, sore throat and trouble swallowing.   Eyes: Negative for pain, discharge and redness.  Respiratory: Positive for cough. Negative for chest tightness and shortness of breath.        States he has chronic shortness of breath especially with exertion. He states this is not changed from his baseline.  Cardiovascular: Negative.   Gastrointestinal: Negative.   Musculoskeletal: Negative.  Negative for neck pain and neck stiffness.  Neurological: Negative.     Allergies  Other  Home Medications   Prior to Admission medications   Medication Sig Start Date End Date Taking? Authorizing Provider  albuterol (PROVENTIL HFA;VENTOLIN HFA) 108 (90 BASE) MCG/ACT inhaler Inhale 2 puffs into the lungs every 6 (six) hours as needed for wheezing or shortness of breath. 02/04/14   JBiagio Borg MD  AMBULATORY NON FORMULARY MEDICATION Home O2 @@ 3 LMP for 8 hrs at night    Historical Provider, MD  ACaddoCPAP    Historical Provider, MD  aspirin EC 81 MG tablet Take 81 mg by mouth daily.    Historical Provider, MD  atorvastatin (LIPITOR) 20 MG tablet Take 1 tablet (20 mg total) by mouth daily at 6 PM. 04/21/14   PThayer Headings MD  Blood Glucose Monitoring Suppl (ACCU-CHEK AVIVA PLUS) W/DEVICE KIT Use to check blood sugars twice a day Dx 250.00 02/07/14   JBiagio Borg MD  carvedilol (COREG) 12.5 MG tablet Take 1 tablet (12.5 mg total) by mouth 2 (two) times daily. 04/21/14   PThayer Headings MD  glucose blood (ACCU-CHEK AVIVA PLUS) test strip Use to check blood sugars five times a day Dx E11.9 01/14/15   AElayne Snare MD  Insulin Glargine (TOUJEO SOLOSTAR) 300 UNIT/ML SOPN Inject 40 Units into the skin at bedtime. 12/03/14   AElayne Snare MD  insulin lispro (HUMALOG KWIKPEN) 100 UNIT/ML KiwkPen Inject 0.2 mLs (20 Units total) into the skin daily. If cbg is <150 take 10 units, if >150 take 15 12/03/14   AElayne Snare MD  Insulin Pen Needle (BD PEN NEEDLE NANO U/F) 32G X 4 MM MISC Use as directed 1 per day   250.02 02/21/14   JBiagio Borg MD  lisinopril (PRINIVIL,ZESTRIL) 10 MG tablet Take 1 tablet (10 mg total) by mouth daily. 04/21/14   PThayer Headings MD  metolazone (ZAROXOLYN) 5 MG tablet Take 1 tablet as needed if weight goes about 305 lbs 10/13/14   DJolaine Artist MD  torsemide (DEMADEX) 20 MG tablet Take 2 tablets (40 mg total) by mouth 2 (two) times daily. 01/08/15   DJolaine Artist MD  triamcinolone cream (KENALOG) 0.1 % Apply 1 application topically 2 (two) times daily. 02/10/15   JHunt Oris  Jenny Reichmann, MD   Meds Ordered and Administered this Visit  Medications - No data to display  BP 166/98 mmHg  Pulse 93  Temp(Src) 97.7 F (36.5 C) (Oral)  Resp 20  SpO2 93% No data found.   Physical Exam  Constitutional: No distress.  Morbidly obese.  HENT:  Mouth/Throat: No oropharyngeal exudate.  Bilateral TMs are normal Oropharynx with clear PND and mild erythema.  Eyes: Conjunctivae and EOM are normal.  Neck: Normal range of motion. Neck supple.  Cardiovascular: Normal rate and regular rhythm.   Pulmonary/Chest: No respiratory distress.  Patient does not make a very good effort and taking a deep breath. Lung sounds distant.   Faint distant wheeze with expiration.  Lymphadenopathy:    He has no cervical adenopathy.  Neurological: He is alert. He exhibits normal muscle tone.  Skin: Skin is warm and dry. No rash noted. He is not diaphoretic.  Psychiatric: He has a normal mood and affect.  Nursing note and vitals reviewed.   ED Course  Procedures (including critical care time)  Labs Review Labs Reviewed - No data to display  Imaging Review Dg Chest 2 View  07/28/2015  CLINICAL DATA:  Cough, body aches, sore  throat and congestion. EXAM: CHEST - 2 VIEW COMPARISON:  09/15/2014 FINDINGS: The heart size and mediastinal contours are within normal limits. Stable positioning of Port-A-Cath with the catheter tip in the SVC. Mild bibasilar atelectasis present. There is no evidence of pulmonary edema, consolidation, pneumothorax, nodule or pleural fluid. The visualized skeletal structures are unremarkable. IMPRESSION: No active disease. Electronically Signed   By: Aletta Edouard M.D.   On: 07/28/2015 20:46     Visual Acuity Review  Right Eye Distance:   Left Eye Distance:   Bilateral Distance:    Right Eye Near:   Left Eye Near:    Bilateral Near:         MDM   1. URI (upper respiratory infection)   2. Sinus drainage   3. Bronchospasm    positive findings with the patient reveals no recent history of fever. He states his breathing is at baseline. The primary concern is that of cough and PND. He has not been using his albuterol HFA on nebulizers on any regular basis or recently. He is instructed to start using those to help with bronchospasm that is likely contributing to his cough. In addition to taking medications below to help with drainage. The chest x-ray shows no active disease or pulmonary edema. Again, no fevers. Respirations and breathing at baseline while on oxygen concentrator. He also notes he has lost 6 pounds of fluid in the past few days with his diarrhetic's. He agrees that he is stable in these regards. The only acute findings are that associated with uncomplicated URI . Continue monitoring  blood sugars and taking your chronic medications as directed.  It is reemphasized that if he develops new symptoms, worsening, fever, chills, problems with breathing that is worse, increased cough, chest pain, increased swelling or other problems go immediately to the emergency department or call EMS. Do not wait to drive to the [facility].  Part of your cough is due to bronchospasm. Start using  your albuterol inhaler or nebulizer every 4-6 hours as needed. Also take the Claritin or Allegra to help with drainage.Continue taking your diuretics to relieve the fluid in your legs. Keep your appointment with the [facility] hospital this week. If you get  worse especially with breathing or develop fevers or worsening  cough can emerge department promptly. Prednisone 50 mg #3 tablets taper dose as directed. Take with food.       Janne Napoleon, NP 07/28/15 2115

## 2015-08-11 ENCOUNTER — Other Ambulatory Visit: Payer: Self-pay

## 2015-08-13 ENCOUNTER — Ambulatory Visit: Payer: Self-pay | Admitting: Oncology

## 2015-09-27 ENCOUNTER — Emergency Department (HOSPITAL_COMMUNITY): Payer: Commercial Managed Care - HMO

## 2015-09-27 ENCOUNTER — Emergency Department (HOSPITAL_COMMUNITY)
Admission: EM | Admit: 2015-09-27 | Discharge: 2015-09-27 | Disposition: A | Discharge status: Home / Self Care | Payer: Commercial Managed Care - HMO | Attending: Emergency Medicine | Admitting: Emergency Medicine

## 2015-09-27 ENCOUNTER — Encounter (HOSPITAL_COMMUNITY): Payer: Self-pay | Admitting: Emergency Medicine

## 2015-09-27 DIAGNOSIS — X58XXXA Exposure to other specified factors, initial encounter: Secondary | ICD-10-CM | POA: Insufficient documentation

## 2015-09-27 DIAGNOSIS — Z87891 Personal history of nicotine dependence: Secondary | ICD-10-CM | POA: Insufficient documentation

## 2015-09-27 DIAGNOSIS — K219 Gastro-esophageal reflux disease without esophagitis: Secondary | ICD-10-CM | POA: Insufficient documentation

## 2015-09-27 DIAGNOSIS — M19032 Primary osteoarthritis, left wrist: Secondary | ICD-10-CM

## 2015-09-27 DIAGNOSIS — S6992XA Unspecified injury of left wrist, hand and finger(s), initial encounter: Secondary | ICD-10-CM | POA: Diagnosis present

## 2015-09-27 DIAGNOSIS — I2510 Atherosclerotic heart disease of native coronary artery without angina pectoris: Secondary | ICD-10-CM | POA: Diagnosis not present

## 2015-09-27 DIAGNOSIS — I252 Old myocardial infarction: Secondary | ICD-10-CM | POA: Insufficient documentation

## 2015-09-27 DIAGNOSIS — E7800 Pure hypercholesterolemia, unspecified: Secondary | ICD-10-CM | POA: Diagnosis not present

## 2015-09-27 DIAGNOSIS — R011 Cardiac murmur, unspecified: Secondary | ICD-10-CM | POA: Diagnosis not present

## 2015-09-27 DIAGNOSIS — Z794 Long term (current) use of insulin: Secondary | ICD-10-CM | POA: Diagnosis not present

## 2015-09-27 DIAGNOSIS — S6392XA Sprain of unspecified part of left wrist and hand, initial encounter: Secondary | ICD-10-CM | POA: Diagnosis not present

## 2015-09-27 DIAGNOSIS — Z8701 Personal history of pneumonia (recurrent): Secondary | ICD-10-CM | POA: Insufficient documentation

## 2015-09-27 DIAGNOSIS — S638X2A Sprain of other part of left wrist and hand, initial encounter: Secondary | ICD-10-CM

## 2015-09-27 DIAGNOSIS — I10 Essential (primary) hypertension: Secondary | ICD-10-CM | POA: Diagnosis not present

## 2015-09-27 DIAGNOSIS — Z9981 Dependence on supplemental oxygen: Secondary | ICD-10-CM | POA: Insufficient documentation

## 2015-09-27 DIAGNOSIS — I5042 Chronic combined systolic (congestive) and diastolic (congestive) heart failure: Secondary | ICD-10-CM | POA: Insufficient documentation

## 2015-09-27 DIAGNOSIS — Z7984 Long term (current) use of oral hypoglycemic drugs: Secondary | ICD-10-CM | POA: Diagnosis not present

## 2015-09-27 DIAGNOSIS — M19042 Primary osteoarthritis, left hand: Secondary | ICD-10-CM | POA: Diagnosis not present

## 2015-09-27 DIAGNOSIS — Y9389 Activity, other specified: Secondary | ICD-10-CM | POA: Insufficient documentation

## 2015-09-27 DIAGNOSIS — S63502A Unspecified sprain of left wrist, initial encounter: Secondary | ICD-10-CM | POA: Diagnosis not present

## 2015-09-27 DIAGNOSIS — E119 Type 2 diabetes mellitus without complications: Secondary | ICD-10-CM | POA: Diagnosis not present

## 2015-09-27 DIAGNOSIS — Y9289 Other specified places as the place of occurrence of the external cause: Secondary | ICD-10-CM | POA: Diagnosis not present

## 2015-09-27 DIAGNOSIS — Z7982 Long term (current) use of aspirin: Secondary | ICD-10-CM | POA: Diagnosis not present

## 2015-09-27 DIAGNOSIS — Y998 Other external cause status: Secondary | ICD-10-CM | POA: Insufficient documentation

## 2015-09-27 DIAGNOSIS — G4733 Obstructive sleep apnea (adult) (pediatric): Secondary | ICD-10-CM | POA: Diagnosis not present

## 2015-09-27 MED ORDER — ACETAMINOPHEN-CODEINE #3 300-30 MG PO TABS: 1.0000 | ORAL_TABLET | Freq: Four times a day (QID) | ORAL | Status: DC | PRN

## 2015-09-27 NOTE — ED Notes (Signed)
Pt reports Thursday that he was on his hands and knees rearranging the tubing to his oxygen tank, CPAP machine and nebulizer. The next day pt was unable to get his pants on due to pain in left wrist.

## 2015-09-27 NOTE — Progress Notes (Signed)
Orthopedic Tech Progress Note Patient Details:  [name] [name] [birth year] [id number]  Ortho Devices Type of Ortho Device: Thumb velcro splint Ortho Device/Splint Interventions: Application   [name] 09/27/2015, 9:10 AM

## 2015-09-27 NOTE — Discharge Instructions (Signed)
Wear thumb/wrist brace for at least 2 weeks for stabilization of wrist/thumb. Ice and elevate wrist throughout the day. Alternate between ibuprofen and tylenol #3 for pain relief. Do not drive or operate machinery with pain medication use. Call the hand specialist follow up today or tomorrow to schedule followup appointment for recheck of ongoing symptoms in 1 week. Return to the ER for changes or worsening symptoms.    Wrist Sprain A wrist sprain is a stretch or tear in the strong, fibrous tissues (ligaments) that connect your wrist bones. The ligaments of your wrist may be easily sprained. There are three types of wrist sprains.  Grade 1. The ligament is not stretched or torn, but the sprain causes pain.  Grade 2. The ligament is stretched or partially torn. You may be able to move your wrist, but not very much.  Grade 3. The ligament or muscle completely tears. You may find it difficult or extremely painful to move your wrist even a little. CAUSES Often, wrist sprains are a result of a fall or an injury. The force of the impact causes the fibers of your ligament to stretch too much or tear. Common causes of wrist sprains include:  Overextending your wrist while catching a ball with your hands.  Repetitive or strenuous extension or bending of your wrist.  Landing on your hand during a fall. RISK FACTORS  Having previous wrist injuries.  Playing contact sports, such as boxing or wrestling.  Participating in activities in which falling is common.  Having poor wrist strength and flexibility. SIGNS AND SYMPTOMS  Wrist pain.  Wrist tenderness.  Inflammation or bruising of the wrist area.  Hearing a "pop" or feeling a tear at the time of the injury.  Decreased wrist movement due to pain, stiffness, or weakness. DIAGNOSIS Your health care provider will examine your wrist. In some cases, an X-ray will be taken to make sure you did not break any bones. If your health care provider  thinks that you tore a ligament, he or she may order an MRI of your wrist. TREATMENT Treatment involves resting and icing your wrist. You may also need to take pain medicines to help lessen pain and inflammation. Your health care provider may recommend keeping your wrist still (immobilized) with a splint to help your sprain heal. When the splint is no longer necessary, you may need to perform strengthening and stretching exercises. These exercises help you to regain strength and full range of motion in your wrist. Surgery is not usually needed for wrist sprains unless the ligament completely tears. HOME CARE INSTRUCTIONS  Rest your wrist. Do not do things that cause pain.  Wear your wrist splint as directed by your health care provider.  Take medicines only as directed by your health care provider.  To ease pain and swelling, apply ice to the injured area.  Put ice in a plastic bag.  Place a towel between your skin and the bag.  Leave the ice on for 20 minutes, 2-3 times a day. SEEK MEDICAL CARE IF:  Your pain, discomfort, or swelling gets worse even with treatment.  You feel sudden numbness in your hand.   This information is not intended to replace advice given to you by your health care provider. Make sure you discuss any questions you have with your health care provider.   Document Released: 03/07/2014 Document Reviewed: 03/07/2014 Elsevier Interactive Patient Education 2016 Elsevier Inc.  Osteoarthritis Osteoarthritis is a disease that causes soreness and inflammation of a  joint. It occurs when the cartilage at the affected joint wears down. Cartilage acts as a cushion, covering the ends of bones where they meet to form a joint. Osteoarthritis is the most common form of arthritis. It often occurs in older people. The joints affected most often by this condition include those in the:  Ends of the fingers.  Thumbs.  Neck.  Lower back.  Knees.  Hips. CAUSES  Over time,  the cartilage that covers the ends of bones begins to wear away. This causes bone to rub on bone, producing pain and stiffness in the affected joints.  RISK FACTORS Certain factors can increase your chances of having osteoarthritis, including:  Older age.  Excessive body weight.  Overuse of joints.  Previous joint injury. SIGNS AND SYMPTOMS   Pain, swelling, and stiffness in the joint.  Over time, the joint may lose its normal shape.  Small deposits of bone (osteophytes) may grow on the edges of the joint.  Bits of bone or cartilage can break off and float inside the joint space. This may cause more pain and damage. DIAGNOSIS  Your health care provider will do a physical exam and ask about your symptoms. Various tests may be ordered, such as:  X-rays of the affected joint.  Blood tests to rule out other types of arthritis. Additional tests may be used to diagnose your condition. TREATMENT  Goals of treatment are to control pain and improve joint function. Treatment plans may include:  A prescribed exercise program that allows for rest and joint relief.  A weight control plan.  Pain relief techniques, such as:  Properly applied heat and cold.  Electric pulses delivered to nerve endings under the skin (transcutaneous electrical nerve stimulation [TENS]).  Massage.  Certain nutritional supplements.  Medicines to control pain, such as:  Acetaminophen.  Nonsteroidal anti-inflammatory drugs (NSAIDs), such as naproxen.  Narcotic or central-acting agents, such as tramadol.  Corticosteroids. These can be given orally or as an injection.  Surgery to reposition the bones and relieve pain (osteotomy) or to remove loose pieces of bone and cartilage. Joint replacement may be needed in advanced states of osteoarthritis. HOME CARE INSTRUCTIONS   Take medicines only as directed by your health care provider.  Maintain a healthy weight. Follow your health care provider's  instructions for weight control. This may include dietary instructions.  Exercise as directed. Your health care provider can recommend specific types of exercise. These may include:  Strengthening exercises. These are done to strengthen the muscles that support joints affected by arthritis. They can be performed with weights or with exercise bands to add resistance.  Aerobic activities. These are exercises, such as brisk walking or low-impact aerobics, that get your heart pumping.  Range-of-motion activities. These keep your joints limber.  Balance and agility exercises. These help you maintain daily living skills.  Rest your affected joints as directed by your health care provider.  Keep all follow-up visits as directed by your health care provider. SEEK MEDICAL CARE IF:   Your skin turns red.  You develop a rash in addition to your joint pain.  You have worsening joint pain.  You have a fever along with joint or muscle aches. SEEK IMMEDIATE MEDICAL CARE IF:  You have a significant loss of weight or appetite.  You have night sweats. [place] of Arthritis and Musculoskeletal and Skin Diseases: www.niams.SouthExposed.es  [employer] on Aging: http://kim-miller.com/  American College of Rheumatology: www.rheumatology.org   This  information is not intended to replace advice given to you by your health care provider. Make sure you discuss any questions you have with your health care provider.   Document Released: 07/04/2005 Document Revised: 07/25/2014 Document Reviewed: 03/11/2013 Elsevier Interactive Patient Education 2016 Elsevier Inc.  Cryotherapy Cryotherapy is when you put ice on your injury. Ice helps lessen pain and puffiness (swelling) after an injury. Ice works the best when you start using it in the first 24 to 48 hours after an injury. HOME CARE  Put a dry or damp towel between the ice pack and your skin.  You may press gently on the  ice pack.  Leave the ice on for no more than 10 to 20 minutes at a time.  Check your skin after 5 minutes to make sure your skin is okay.  Rest at least 20 minutes between ice pack uses.  Stop using ice when your skin loses feeling (numbness).  Do not use ice on someone who cannot tell you when it hurts. This includes small children and people with memory problems (dementia). GET HELP RIGHT AWAY IF:  You have white spots on your skin.  Your skin turns blue or pale.  Your skin feels waxy or hard.  Your puffiness gets worse. MAKE SURE YOU:   Understand these instructions.  Will watch your condition.  Will get help right away if you are not doing well or get worse.   This information is not intended to replace advice given to you by your health care provider. Make sure you discuss any questions you have with your health care provider.   Document Released: 12/21/2007 Document Revised: 09/26/2011 Document Reviewed: 02/24/2011 Elsevier Interactive Patient Education [employer].

## 2015-09-27 NOTE — ED Provider Notes (Signed)
CSN: [id number]     Arrival date & time 09/27/15  0706 History   First MD Initiated Contact with Patient 09/27/15 0804     Chief Complaint  Patient presents with  . Wrist Injury     (Consider location/radiation/quality/duration/timing/severity/associated sxs/prior Treatment) HPI Comments: JOURNEY RATTERMAN is a 70 y.o. male with a PMHx of ischemic cardiomyopathy, HTN, HLD, CHF, GERD, CAD, aortic stenosis, diverticulosis, obesity, CLL s/p chemo, OSA on CPAP, on home oxygen, DM2, and arthritis, who presents to the ED with complaints of left wrist pain that began gradually on Friday.He states that on Thursday he was straightening out the tubing of his home oxygen tank and he is on his hands and knees during that time, denies any fall or striking his wrist against anything specific, but developed pain on Friday. he describes the pain is 10/10 intermittent left wrist and thumb pain which is sharp and nonradiating, worse with palpation and movement, improved somewhat with ice, and unrelieved with heat. Associated symptoms include mild swelling to the area and pain with movement of the thumb causing slightly diminished ROM in the thumb due to pain. He denies any bruising, erythema, warmth, fevers, chills, chest pain, worsening shortness of breath from baseline, abdominal pain, nausea, vomiting, diarrhea, constipation, urinary symptoms, numbness, tingling, or focal weakness.  Patient is a 69 y.o. male presenting with wrist injury. The history is provided by the patient. No language interpreter was used.  Wrist Injury Location:  Wrist Time since incident:  2 days Injury: yes   Mechanism of injury comment:  On hands and knees rearranging his oxygen tubing Wrist location:  L wrist Pain details:    Quality:  Sharp   Radiates to:  Does not radiate   Severity:  Severe   Onset quality:  Gradual   Duration:  2 days   Timing:  Intermittent   Progression:  Unchanged Chronicity:  New Prior injury to area:   No Relieved by:  Ice Worsened by:  Movement Ineffective treatments:  Heat Associated symptoms: decreased range of motion (due to pain) and swelling   Associated symptoms: no fever, no muscle weakness, no numbness and no tingling     Past Medical History  Diagnosis Date  . Ischemic cardiomyopathy   . Hypertension   . Hypercholesterolemia   . Lower GI bleeding 1980's    "when I was drinking alot" (06/06/2012)  . Chronic combined systolic and diastolic CHF (congestive heart failure) (HCC) 07/10/2012    a. EF 35-40%, + diastolic dysfunction by echo 12/2012.  Marland Kitchen GERD (gastroesophageal reflux disease) 07/12/2012  . Coronary artery disease     a. Cath 05/2012 with CTO of RCA; s/p BMS to severe OM-1 stenosis.  Marland Kitchen History of alcoholism (HCC)     "haven't had a drink in 23 yr" (09/12/2014)  . Moderate aortic stenosis     a. Mod by echo 12/2012.  Marland Kitchen Pancreatitis   . Diverticulosis   . Personal history of rectal adenoma 05/28/2013  . Dietary noncompliance   . Morbid obesity (HCC)   . Lymphoma (HCC)     a. 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  . Shortness of breath   . Myocardial infarction (HCC) 11/13  . Pneumonia 05/2012    "right after stent was put in"  . OSA on CPAP   . On home oxygen therapy     "3L; pretty much last 1 1/2 weeks" (09/12/2014)  . Type II diabetes  mellitus ([place])   . History of stomach ulcers     "when I drank alot; not bothered w/them since I quit drinking"  . Arthritis     "both shoulders" (09/12/2014)   Past Surgical History  Procedure Laterality Date  . Coronary angioplasty with stent placement  06/06/2012    "1"  . Lymph gland excision  07/13/2012    Procedure: CERVICAL LYMPH GLAND EXCISION;  Surgeon: Haywood Lasso, MD;  Location: [facility];  Service: General;  Laterality: Right;  . Portacath placement  07/30/2012    Procedure: INSERTION PORT-A-CATH;  Surgeon: Haywood Lasso,  MD;  Location: [facility];  Service: General;  Laterality: Right;  . Colonoscopy N/A 05/28/2013    Procedure: COLONOSCOPY;  Surgeon: Gatha Mayer, MD;  Location: [facility];  Service: Endoscopy;  Laterality: N/A;  . Left and right heart catheterization with coronary angiogram N/A 06/06/2012    Procedure: LEFT AND RIGHT HEART CATHETERIZATION WITH CORONARY ANGIOGRAM;  Surgeon: Pixie Casino, MD;  Location: [facility] CATH LAB;  Service: Cardiovascular;  Laterality: N/A;  . Percutaneous coronary stent intervention (pci-s)  06/06/2012    Procedure: PERCUTANEOUS CORONARY STENT INTERVENTION (PCI-S);  Surgeon: Troy Sine, MD;  Location: [facility] CATH LAB;  Service: Cardiovascular;;  . Cardiac catheterization  1//20/2013    60fench atrerial sheath   Family History  Problem Relation Age of Onset  . Osteoarthritis Mother   . Osteoarthritis Father   . Colon cancer Daughter   . Diabetes Father     entire family  . Heart disease Father    Social History  Substance Use Topics  . Smoking status: Former Smoker -- 2.00 packs/day for 53 years    Types: Cigarettes    Quit date: 07/18/2013  . Smokeless tobacco: Never Used  . Alcohol Use: No     Comment: Hasn't drank since 16948(former alcoholic)    Review of Systems  Constitutional: Negative for fever and chills.  Respiratory: Negative for shortness of breath (none worse than baseline).   Cardiovascular: Negative for chest pain.  Gastrointestinal: Negative for nausea, vomiting, abdominal pain, diarrhea and constipation.  Genitourinary: Negative for dysuria and hematuria.  Musculoskeletal: Positive for joint swelling and arthralgias. Negative for myalgias.  Skin: Negative for color change and wound.  Allergic/Immunologic: Positive for immunocompromised state (diabetic).  Neurological: Negative for syncope, weakness and numbness.  Psychiatric/Behavioral: Negative for confusion.   10 Systems reviewed and are negative for acute change  except as noted in the HPI.    Allergies  Other  Home Medications   Prior to Admission medications   Medication Sig Start Date End Date Taking? Authorizing Provider  albuterol (PROVENTIL HFA;VENTOLIN HFA) 108 (90 BASE) MCG/ACT inhaler Inhale 2 puffs into the lungs every 6 (six) hours as needed for wheezing or shortness of breath. 02/04/14   JBiagio Borg MD  AMBULATORY NON FORMULARY MEDICATION Home O2 @@ 3 LMP for 8 hrs at night    Historical Provider, MD  ALa CenterCPAP    Historical Provider, MD  aspirin EC 81 MG tablet Take 81 mg by mouth daily.    Historical Provider, MD  atorvastatin (LIPITOR) 20 MG tablet Take 1 tablet (20 mg total) by mouth daily at 6 PM. 04/21/14   PThayer Headings MD  Blood Glucose Monitoring Suppl (ACCU-CHEK AVIVA PLUS) W/DEVICE KIT Use to check blood sugars twice a day Dx 250.00 02/07/14   JBiagio Borg MD  carvedilol (COREG) 12.5 MG tablet  Take 1 tablet (12.5 mg total) by mouth 2 (two) times daily. 04/21/14   Vesta Mixer, MD  glucose blood (ACCU-CHEK AVIVA PLUS) test strip Use to check blood sugars five times a day Dx E11.9 01/14/15   Reather Littler, MD  Insulin Glargine (TOUJEO SOLOSTAR) 300 UNIT/ML SOPN Inject 40 Units into the skin at bedtime. 12/03/14   Reather Littler, MD  insulin lispro (HUMALOG KWIKPEN) 100 UNIT/ML KiwkPen Inject 0.2 mLs (20 Units total) into the skin daily. If cbg is <150 take 10 units, if >150 take 15 12/03/14   Reather Littler, MD  Insulin Pen Needle (BD PEN NEEDLE NANO U/F) 32G X 4 MM MISC Use as directed 1 per day   250.02 02/21/14   Corwin Levins, MD  lisinopril (PRINIVIL,ZESTRIL) 10 MG tablet Take 1 tablet (10 mg total) by mouth daily. 04/21/14   Vesta Mixer, MD  metolazone (ZAROXOLYN) 5 MG tablet Take 1 tablet as needed if weight goes about 305 lbs 10/13/14   Dolores Patty, MD  predniSONE (DELTASONE) 50 MG tablet 1 tab po on day 1, one tab po on day 2, 1/2 tab po on days 3 and 4 07/28/15   Hayden Rasmussen, NP  torsemide  (DEMADEX) 20 MG tablet Take 2 tablets (40 mg total) by mouth 2 (two) times daily. 01/08/15   Dolores Patty, MD  triamcinolone cream (KENALOG) 0.1 % Apply 1 application topically 2 (two) times daily. 02/10/15   Corwin Levins, MD   BP 139/80 mmHg  Pulse 95  Temp(Src) 97.5 F (36.4 C) (Oral)  Resp 22  Ht 6\' 3"  (1.905 m)  Wt 129.729 kg  BMI 35.75 kg/m2  SpO2 92% Physical Exam  Constitutional: He is oriented to person, place, and time. Vital signs are normal. He appears well-developed and well-nourished.  Non-toxic appearance. No distress.  Afebrile, nontoxic, NAD  HENT:  Head: Normocephalic and atraumatic.  Mouth/Throat: Oropharynx is clear and moist and mucous membranes are normal.  Eyes: Conjunctivae and EOM are normal. Right eye exhibits no discharge. Left eye exhibits no discharge.  Neck: Normal range of motion. Neck supple.  Cardiovascular: Normal rate, regular rhythm, S1 normal, S2 normal and intact distal pulses.  Exam reveals no gallop and no friction rub.   Murmur heard.  Systolic murmur is present  Systolic murmur faintly audible  Pulmonary/Chest: Effort normal and breath sounds normal. No respiratory distress. He has no decreased breath sounds. He has no wheezes. He has no rhonchi. He has no rales.  Abdominal: Soft. Normal appearance and bowel sounds are normal. He exhibits no distension. There is no tenderness. There is no rigidity, no rebound and no guarding.  Musculoskeletal:       Left wrist: He exhibits decreased range of motion (due to pain), tenderness, bony tenderness and swelling. He exhibits no crepitus, no deformity and no laceration.       Arms: L wrist with limited ROM with thumb/pinky opposition and adduction at the [facility] joint of the 1st digit due to pain but FROM intact at the remaining digits, with moderate focal tenderness at the 1st [facility] joint, minimal swelling noted to this area with no bruising or abrasions, no warmth or erythema, without crepitus or deformity,  with sensation grossly intact, grip strength grossly intact, distal pulses intact, and soft compartments. No other focal tenderness to remainder of hand or wrist.  Neurological: He is alert and oriented to person, place, and time. He has normal strength. No sensory deficit.  Skin:  Skin is warm, dry and intact. No rash noted.  Psychiatric: He has a normal mood and affect.  Nursing note and vitals reviewed.   ED Course  Procedures (including critical care time) Labs Review Labs Reviewed - No data to display  Imaging Review Dg Wrist Complete Left  09/27/2015  CLINICAL DATA:  Wrist pain.  Injury 2 wrist wall crawling on floor. EXAM: LEFT WRIST - COMPLETE 3+ VIEW COMPARISON:  None available. FINDINGS: Moderate degenerative changes with an mild subluxation are noted at the first [facility] joint. There is adjacent soft tissue swelling. No acute osseous abnormality is present. Vascular calcifications are noted in the distal radial artery. The carpal bones are otherwise intact. IMPRESSION: 1. Soft tissue swelling at the first [facility] joint without underlying fracture. 2. Moderate chronic degenerative change and mild subluxation at the first [facility] joint. 3. Atherosclerosis. Electronically Signed   By: San Morelle M.D.   On: 09/27/2015 08:07   I have personally reviewed and evaluated these images and lab results as part of my medical decision-making.   EKG Interpretation None      MDM   Final diagnoses:  Left wrist sprain, initial encounter  CMC (carpometacarpal joint) sprains, left, initial encounter  [facility] DJD(carpometacarpal degenerative joint disease), localized primary, left    70 y.o. male here with L wrist/thumb pain after crawling around on hands and knees trying to get the tubing to his home oxygen machine straightened out. NVI with soft compartments, mild swelling at the [facility] joint with moderate tenderness to this area, no other focal bony TTP. No crepitus or deformity. Pt driving and has no  other ride home so he doesn't want anything for pain. Awaiting Xray results. Will reassess shortly  8:25 AM Xray showing soft tissue swelling at [facility] joint which is where his tenderness is, no underlying fx, with moderate degenerative changes and mild subluxation of the [facility] joint. Will splint in thumb spica splint and refer to hand specialist for ongoing care, doubt need for acute intervention at this time. Will send home with rx for pain meds. Discussed RICE therapy. Discussed case with my attending Dr. Zenia Resides who agreed with plan. I explained the diagnosis and have given explicit precautions to return to the ER including for any other new or worsening symptoms. The patient understands and accepts the medical plan as it's been dictated and I have answered their questions. Discharge instructions concerning home care and prescriptions have been given. The patient is STABLE and is discharged to home in good condition.    BP 139/80 mmHg  Pulse 95  Temp(Src) 97.5 F (36.4 C) (Oral)  Resp 22  Ht '6\' 3"'$  (1.905 m)  Wt 129.729 kg  BMI 35.75 kg/m2  SpO2 92%  Meds ordered this encounter  Medications  . acetaminophen-codeine (TYLENOL #3) 300-30 MG tablet    Sig: Take 1-2 tablets by mouth every 6 (six) hours as needed for moderate pain.    Dispense:  15 tablet    Refill:  0    Order Specific Question:  Supervising Provider    Answer:  Noemi Chapel [[id number]]     Raja Caputi Camprubi-Soms, PA-C 09/27/15 0092  Lacretia Leigh, MD 09/27/15 [phone]

## 2015-09-28 ENCOUNTER — Telehealth (HOSPITAL_BASED_OUTPATIENT_CLINIC_OR_DEPARTMENT_OTHER): Payer: Self-pay | Admitting: Emergency Medicine

## 2015-10-27 ENCOUNTER — Encounter (HOSPITAL_COMMUNITY): Payer: Self-pay | Admitting: Emergency Medicine

## 2015-10-27 ENCOUNTER — Emergency Department (HOSPITAL_COMMUNITY)
Admission: EM | Admit: 2015-10-27 | Discharge: 2015-10-27 | Disposition: A | Discharge status: Home / Self Care | Payer: Commercial Managed Care - HMO | Attending: Emergency Medicine | Admitting: Emergency Medicine

## 2015-10-27 DIAGNOSIS — I2510 Atherosclerotic heart disease of native coronary artery without angina pectoris: Secondary | ICD-10-CM | POA: Insufficient documentation

## 2015-10-27 DIAGNOSIS — Z9981 Dependence on supplemental oxygen: Secondary | ICD-10-CM | POA: Insufficient documentation

## 2015-10-27 DIAGNOSIS — I252 Old myocardial infarction: Secondary | ICD-10-CM | POA: Insufficient documentation

## 2015-10-27 DIAGNOSIS — M545 Low back pain: Secondary | ICD-10-CM | POA: Diagnosis not present

## 2015-10-27 DIAGNOSIS — G4733 Obstructive sleep apnea (adult) (pediatric): Secondary | ICD-10-CM | POA: Diagnosis not present

## 2015-10-27 DIAGNOSIS — I10 Essential (primary) hypertension: Secondary | ICD-10-CM | POA: Diagnosis not present

## 2015-10-27 DIAGNOSIS — M7989 Other specified soft tissue disorders: Secondary | ICD-10-CM | POA: Diagnosis not present

## 2015-10-27 DIAGNOSIS — I5042 Chronic combined systolic (congestive) and diastolic (congestive) heart failure: Secondary | ICD-10-CM | POA: Diagnosis not present

## 2015-10-27 NOTE — ED Notes (Signed)
Pt advising he is no longer in pain.  Pt advising he is leaving AMA. Encouraged him to stay.

## 2015-10-27 NOTE — ED Notes (Addendum)
Pt c/o pain in left lower back for several days.  No known injury.  Pt also c/o swelling to left hand for several weeks with no known injury.  Also c/o bil calve pain.  Pt was seen here on 3/12 for his hand and was given a referral appt. With hand doctor but did not follow up

## 2015-11-17 ENCOUNTER — Encounter (HOSPITAL_COMMUNITY): Payer: Self-pay | Admitting: Emergency Medicine

## 2015-11-17 ENCOUNTER — Emergency Department (HOSPITAL_COMMUNITY)
Admission: EM | Admit: 2015-11-17 | Discharge: 2015-11-17 | Disposition: A | Discharge status: Home / Self Care | Payer: Commercial Managed Care - HMO | Attending: Emergency Medicine | Admitting: Emergency Medicine

## 2015-11-17 ENCOUNTER — Emergency Department (HOSPITAL_COMMUNITY): Payer: Commercial Managed Care - HMO

## 2015-11-17 ENCOUNTER — Encounter: Payer: Self-pay | Admitting: Internal Medicine

## 2015-11-17 DIAGNOSIS — Z7952 Long term (current) use of systemic steroids: Secondary | ICD-10-CM | POA: Diagnosis not present

## 2015-11-17 DIAGNOSIS — I5042 Chronic combined systolic (congestive) and diastolic (congestive) heart failure: Secondary | ICD-10-CM | POA: Insufficient documentation

## 2015-11-17 DIAGNOSIS — E7800 Pure hypercholesterolemia, unspecified: Secondary | ICD-10-CM | POA: Insufficient documentation

## 2015-11-17 DIAGNOSIS — G4733 Obstructive sleep apnea (adult) (pediatric): Secondary | ICD-10-CM | POA: Diagnosis not present

## 2015-11-17 DIAGNOSIS — Z7982 Long term (current) use of aspirin: Secondary | ICD-10-CM | POA: Diagnosis not present

## 2015-11-17 DIAGNOSIS — M545 Low back pain, unspecified: Secondary | ICD-10-CM

## 2015-11-17 DIAGNOSIS — E669 Obesity, unspecified: Secondary | ICD-10-CM | POA: Insufficient documentation

## 2015-11-17 DIAGNOSIS — M19011 Primary osteoarthritis, right shoulder: Secondary | ICD-10-CM | POA: Diagnosis not present

## 2015-11-17 DIAGNOSIS — Z955 Presence of coronary angioplasty implant and graft: Secondary | ICD-10-CM | POA: Diagnosis not present

## 2015-11-17 DIAGNOSIS — M549 Dorsalgia, unspecified: Secondary | ICD-10-CM | POA: Diagnosis present

## 2015-11-17 DIAGNOSIS — M19012 Primary osteoarthritis, left shoulder: Secondary | ICD-10-CM | POA: Diagnosis not present

## 2015-11-17 DIAGNOSIS — Z794 Long term (current) use of insulin: Secondary | ICD-10-CM | POA: Insufficient documentation

## 2015-11-17 DIAGNOSIS — I2510 Atherosclerotic heart disease of native coronary artery without angina pectoris: Secondary | ICD-10-CM | POA: Diagnosis not present

## 2015-11-17 DIAGNOSIS — I252 Old myocardial infarction: Secondary | ICD-10-CM | POA: Diagnosis not present

## 2015-11-17 DIAGNOSIS — Z6835 Body mass index (BMI) 35.0-35.9, adult: Secondary | ICD-10-CM | POA: Diagnosis not present

## 2015-11-17 DIAGNOSIS — K219 Gastro-esophageal reflux disease without esophagitis: Secondary | ICD-10-CM | POA: Diagnosis not present

## 2015-11-17 DIAGNOSIS — I110 Hypertensive heart disease with heart failure: Secondary | ICD-10-CM | POA: Diagnosis not present

## 2015-11-17 DIAGNOSIS — Z86010 Personal history of colonic polyps: Secondary | ICD-10-CM | POA: Insufficient documentation

## 2015-11-17 DIAGNOSIS — I255 Ischemic cardiomyopathy: Secondary | ICD-10-CM | POA: Diagnosis not present

## 2015-11-17 DIAGNOSIS — Z87891 Personal history of nicotine dependence: Secondary | ICD-10-CM | POA: Diagnosis not present

## 2015-11-17 DIAGNOSIS — Z79891 Long term (current) use of opiate analgesic: Secondary | ICD-10-CM | POA: Diagnosis not present

## 2015-11-17 DIAGNOSIS — Z79899 Other long term (current) drug therapy: Secondary | ICD-10-CM | POA: Insufficient documentation

## 2015-11-17 DIAGNOSIS — Z7951 Long term (current) use of inhaled steroids: Secondary | ICD-10-CM | POA: Diagnosis not present

## 2015-11-17 DIAGNOSIS — M62830 Muscle spasm of back: Secondary | ICD-10-CM

## 2015-11-17 LAB — URINALYSIS, ROUTINE W REFLEX MICROSCOPIC
Bilirubin Urine: NEGATIVE
Glucose, UA: NEGATIVE mg/dL
KETONES UR: NEGATIVE mg/dL
LEUKOCYTES UA: NEGATIVE
NITRITE: NEGATIVE
PROTEIN: NEGATIVE mg/dL
Specific Gravity, Urine: 1.015 (ref 1.005–1.030)
pH: 5 (ref 5.0–8.0)

## 2015-11-17 LAB — URINE MICROSCOPIC-ADD ON

## 2015-11-17 MED ORDER — HYDROCODONE-ACETAMINOPHEN 5-325 MG PO TABS
1.0000 | ORAL_TABLET | Freq: Once | ORAL | Status: AC
  Administered 2015-11-17: 1 via ORAL
  Filled 2015-11-17: qty 1

## 2015-11-17 MED ORDER — CYCLOBENZAPRINE HCL 10 MG PO TABS: 10.0000 mg | ORAL_TABLET | Freq: Two times a day (BID) | ORAL | Status: DC | PRN

## 2015-11-17 MED ORDER — HYDROCODONE-ACETAMINOPHEN 5-325 MG PO TABS: 2.0000 | ORAL_TABLET | ORAL | Status: DC | PRN

## 2015-11-17 NOTE — ED Notes (Signed)
Began having back pain Sunday night after doing work around the yard. Has worsened over the last couple days. Says he was seen at [facility] for same complaint a couple weeks ago, and while waiting in the lobby for 4 hours the pain subsided and he left. No neuro/musculoskeletal deficits observed in triage.

## 2015-11-17 NOTE — Discharge Instructions (Signed)
Back Pain, Adult °Back pain is very common in adults. The cause of back pain is rarely dangerous and the pain often gets better over time. The cause of your back pain may not be known. Some common causes of back pain include: °· Strain of the muscles or ligaments supporting the spine. °· Wear and tear (degeneration) of the spinal disks. °· Arthritis. °· Direct injury to the back. °For many people, back pain may return. Since back pain is rarely dangerous, most people can learn to manage this condition on their own. °HOME CARE INSTRUCTIONS °Watch your back pain for any changes. The following actions may help to lessen any discomfort you are feeling: °· Remain active. It is stressful on your back to sit or stand in one place for long periods of time. Do not sit, drive, or stand in one place for more than 30 minutes at a time. Take short walks on even surfaces as soon as you are able. Try to increase the length of time you walk each day. °· Exercise regularly as directed by your health care provider. Exercise helps your back heal faster. It also helps avoid future injury by keeping your muscles strong and flexible. °· Do not stay in bed. Resting more than 1-2 days can delay your recovery. °· Pay attention to your body when you bend and lift. The most comfortable positions are those that put less stress on your recovering back. Always use proper lifting techniques, including: °· Bending your knees. °· Keeping the load close to your body. °· Avoiding twisting. °· Find a comfortable position to sleep. Use a firm mattress and lie on your side with your knees slightly bent. If you lie on your back, put a pillow under your knees. °· Avoid feeling anxious or stressed. Stress increases muscle tension and can worsen back pain. It is important to recognize when you are anxious or stressed and learn ways to manage it, such as with exercise. °· Take medicines only as directed by your health care provider. Over-the-counter  medicines to reduce pain and inflammation are often the most helpful. Your health care provider may prescribe muscle relaxant drugs. These medicines help dull your pain so you can more quickly return to your normal activities and healthy exercise. °· Apply ice to the injured area: °· Put ice in a plastic bag. °· Place a towel between your skin and the bag. °· Leave the ice on for 20 minutes, 2-3 times a day for the first 2-3 days. After that, ice and heat may be alternated to reduce pain and spasms. °· Maintain a healthy weight. Excess weight puts extra stress on your back and makes it difficult to maintain good posture. °SEEK MEDICAL CARE IF: °· You have pain that is not relieved with rest or medicine. °· You have increasing pain going down into the legs or buttocks. °· You have pain that does not improve in one week. °· You have night pain. °· You lose weight. °· You have a fever or chills. °SEEK IMMEDIATE MEDICAL CARE IF:  °· You develop new bowel or bladder control problems. °· You have unusual weakness or numbness in your arms or legs. °· You develop nausea or vomiting. °· You develop abdominal pain. °· You feel faint. °  °This information is not intended to replace advice given to you by your health care provider. Make sure you discuss any questions you have with your health care provider. °  °Document Released: 07/04/2005 Document Revised: 07/25/2014 Document Reviewed: 11/05/2013 °Elsevier Interactive Patient Education ©2016 [employer]   [employer]  Heat Therapy Heat therapy can help ease sore, stiff, injured, and tight muscles and joints. Heat relaxes your muscles, which may help ease your pain.  RISKS AND COMPLICATIONS If you have any of the following conditions, do not use heat therapy unless your health care provider has approved:  Poor circulation.  Healing wounds or scarred skin in the area being treated.  Diabetes, heart disease, or high blood pressure.  Not being able to feel (numbness) the area  being treated.  Unusual swelling of the area being treated.  Active infections.  Blood clots.  Cancer.  Inability to communicate pain. This may include young children and people who have problems with their brain function (dementia).  Pregnancy. Heat therapy should only be used on old, pre-existing, or long-lasting (chronic) injuries. Do not use heat therapy on new injuries unless directed by your health care provider. HOW TO USE HEAT THERAPY There are several different kinds of heat therapy, including:  Moist heat pack.  Warm water bath.  Hot water bottle.  Electric heating pad.  Heated gel pack.  Heated wrap.  Electric heating pad. Use the heat therapy method suggested by your health care provider. Follow your health care provider's instructions on when and how to use heat therapy. GENERAL HEAT THERAPY RECOMMENDATIONS  Do not sleep while using heat therapy. Only use heat therapy while you are awake.  Your skin may turn pink while using heat therapy. Do not use heat therapy if your skin turns red.  Do not use heat therapy if you have new pain.  High heat or long exposure to heat can cause burns. Be careful when using heat therapy to avoid burning your skin.  Do not use heat therapy on areas of your skin that are already irritated, such as with a rash or sunburn. SEEK MEDICAL CARE IF:  You have blisters, redness, swelling, or numbness.  You have new pain.  Your pain is worse. MAKE SURE YOU:  Understand these instructions.  Will watch your condition.  Will get help right away if you are not doing well or get worse.   This information is not intended to replace advice given to you by your health care provider. Make sure you discuss any questions you have with your health care provider.   Please follow up with your primary care doctor as soon as possible for reevaluation. Take pain medication and muscle relaxers as needed. Apply ice to affected area. Avoid  strenuous activity or heavy lifting. If your symptoms worsen at all or you experience abdominal pain, numbness or tingling in both of your lower extremities, loss of bowel or bladder control please return to the emergency department.

## 2015-11-17 NOTE — ED Provider Notes (Signed)
CSN: [id number]     Arrival date & time 11/17/15  1241 History  By signing my name below, I, Soijett Blue, attest that this documentation has been prepared under the direction and in the presence of Donnald Garre, PA-C Electronically Signed: Soijett Blue, ED Scribe. 11/17/2015. 3:04 PM.   Chief Complaint  Patient presents with  . Back Pain     The history is provided by the patient. No language interpreter was used.    HPI Comments: [name] [name] is a 70 y.o. male with a medical hx of lymphoma, CHF, CAD, HTN, DM, who presents to the Emergency Department complaining of worsening right sided back pain onset 3 days. Pt states that he has "right sided kidney pain" that has an intermittent cramping sensation. Pt notes that his symptoms occurred while he was watching TV. Pt denies heavy lifting. Pt states that movement worsens his right sided back pain. Pt right sided back pain is alleviated with rest. Pt last saw his oncologist 2 weeks ago and has another appointment on 12/12/2015. Pt states that he has been in remission for several years. He states that he has tried ibuprofen with no relief for his symptoms. Pt denies bowel/bladder incontinence, fever, difficulty urinating, dysuria, hematuria, and any other symptoms.    Per pt chart review: Pt was seen in the ED on 10/27/2015 for back pain but LWBS following triage. It was noted that the pt left after his pain subsiding while in the waiting room.   Past Medical History  Diagnosis Date  . Ischemic cardiomyopathy   . Hypertension   . Hypercholesterolemia   . Lower GI bleeding 1980's    "when I was drinking alot" (06/06/2012)  . Chronic combined systolic and diastolic CHF (congestive heart failure) ([place]) 07/10/2012    a. EF 46-96%, + diastolic dysfunction by echo 12/2012.  Marland Kitchen GERD (gastroesophageal reflux disease) 07/12/2012  . Coronary artery disease     a. Cath 05/2012 with CTO of RCA; s/p BMS to severe OM-1 stenosis.  Marland Kitchen History of  alcoholism ([place])     "haven't had a drink in 23 yr" (09/12/2014)  . Moderate aortic stenosis     a. Mod by echo 12/2012.  Marland Kitchen Pancreatitis   . Diverticulosis   . Personal history of rectal adenoma 05/28/2013  . Dietary noncompliance   . Morbid obesity ([place])   . Lymphoma ([place])     a. 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  . Shortness of breath   . Myocardial infarction ([place]) 11/13  . Pneumonia 05/2012    "right after stent was put in"  . OSA on CPAP   . On home oxygen therapy     "3L; pretty much last 1 1/2 weeks" (09/12/2014)  . Type II diabetes mellitus ([place])   . History of stomach ulcers     "when I drank alot; not bothered w/them since I quit drinking"  . Arthritis     "both shoulders" (09/12/2014)   Past Surgical History  Procedure Laterality Date  . Coronary angioplasty with stent placement  06/06/2012    "1"  . Lymph gland excision  07/13/2012    Procedure: CERVICAL LYMPH GLAND EXCISION;  Surgeon: Haywood Lasso, MD;  Location: [facility];  Service: General;  Laterality: Right;  . Portacath placement  07/30/2012    Procedure: INSERTION PORT-A-CATH;  Surgeon: Haywood Lasso, MD;  Location: [facility];  Service: General;  Laterality: Right;  .  Colonoscopy N/A 05/28/2013    Procedure: COLONOSCOPY;  Surgeon: Gatha Mayer, MD;  Location: [facility];  Service: Endoscopy;  Laterality: N/A;  . Left and right heart catheterization with coronary angiogram N/A 06/06/2012    Procedure: LEFT AND RIGHT HEART CATHETERIZATION WITH CORONARY ANGIOGRAM;  Surgeon: Pixie Casino, MD;  Location: [facility] CATH LAB;  Service: Cardiovascular;  Laterality: N/A;  . Percutaneous coronary stent intervention (pci-s)  06/06/2012    Procedure: PERCUTANEOUS CORONARY STENT INTERVENTION (PCI-S);  Surgeon: Troy Sine, MD;  Location: [facility] CATH LAB;  Service: Cardiovascular;;  . Cardiac catheterization  1//20/2013     56fench atrerial sheath   Family History  Problem Relation Age of Onset  . Osteoarthritis Mother   . Osteoarthritis Father   . Colon cancer Daughter   . Diabetes Father     entire family  . Heart disease Father    Social History  Substance Use Topics  . Smoking status: Former Smoker -- 2.00 packs/day for 53 years    Types: Cigarettes    Quit date: 07/18/2013  . Smokeless tobacco: Never Used  . Alcohol Use: No     Comment: Hasn't drank since 15638(former alcoholic)    Review of Systems  Constitutional: Negative for fever.  Gastrointestinal:       No bowel incontinence  Genitourinary: Negative for dysuria, hematuria and difficulty urinating.       No bladder incontinence  Musculoskeletal: Positive for back pain.  All other systems reviewed and are negative.     Allergies  Other  Home Medications   Prior to Admission medications   Medication Sig Start Date End Date Taking? Authorizing Provider  atorvastatin (LIPITOR) 20 MG tablet Take 1 tablet (20 mg total) by mouth daily at 6 PM. 04/21/14  Yes PThayer Headings MD  carvedilol (COREG) 12.5 MG tablet Take 1 tablet (12.5 mg total) by mouth 2 (two) times daily. 04/21/14  Yes PThayer Headings MD  acetaminophen-codeine (TYLENOL #3) 300-30 MG tablet Take 1-2 tablets by mouth every 6 (six) hours as needed for moderate pain. 09/27/15   Mercedes Camprubi-Soms, PA-C  albuterol (PROVENTIL HFA;VENTOLIN HFA) 108 (90 BASE) MCG/ACT inhaler Inhale 2 puffs into the lungs every 6 (six) hours as needed for wheezing or shortness of breath. Patient not taking: Reported on 11/17/2015 02/04/14   JBiagio Borg MD  AMBULATORY NON FLa ValeO2 @@ 3 LMP for 8 hrs at night    Historical Provider, MD  [place]AP    Historical Provider, MD  aspirin EC 81 MG tablet Take 81 mg by mouth daily.    Historical Provider, MD  Blood Glucose Monitoring Suppl (ACCU-CHEK AVIVA PLUS) W/DEVICE KIT Use to check blood sugars  twice a day Dx 250.00 02/07/14   JBiagio Borg MD  glucose blood (ACCU-CHEK AVIVA PLUS) test strip Use to check blood sugars five times a day Dx E11.9 01/14/15   AElayne Snare MD  Insulin Glargine (TOUJEO SOLOSTAR) 300 UNIT/ML SOPN Inject 40 Units into the skin at bedtime. Patient not taking: Reported on 11/17/2015 12/03/14   AElayne Snare MD  insulin lispro (HUMALOG KWIKPEN) 100 UNIT/ML KiwkPen Inject 0.2 mLs (20 Units total) into the skin daily. If cbg is <150 take 10 units, if >150 take 15 Patient not taking: Reported on 11/17/2015 12/03/14   AElayne Snare MD  Insulin Pen Needle (BD PEN NEEDLE NANO U/F) 32G X 4 MM MISC Use as directed 1 per day   250.02  02/21/14   Biagio Borg, MD  lisinopril (PRINIVIL,ZESTRIL) 10 MG tablet Take 1 tablet (10 mg total) by mouth daily. 04/21/14   Thayer Headings, MD  metolazone (ZAROXOLYN) 5 MG tablet Take 1 tablet as needed if weight goes about 305 lbs 10/13/14   Jolaine Artist, MD  predniSONE (DELTASONE) 50 MG tablet 1 tab po on day 1, one tab po on day 2, 1/2 tab po on days 3 and 4 07/28/15   Janne Napoleon, NP  torsemide (DEMADEX) 20 MG tablet Take 2 tablets (40 mg total) by mouth 2 (two) times daily. 01/08/15   Jolaine Artist, MD  triamcinolone cream (KENALOG) 0.1 % Apply 1 application topically 2 (two) times daily. 02/10/15   Biagio Borg, MD   BP 129/69 mmHg  Pulse 90  Temp(Src) 98.1 F (36.7 C) (Oral)  Resp 18  Ht '6\' 2"'$  (1.88 m)  Wt 280 lb (127.007 kg)  BMI 35.93 kg/m2  SpO2 94% Physical Exam  Constitutional: He is oriented to person, place, and time. He appears well-developed and well-nourished. No distress.  HENT:  Head: Normocephalic and atraumatic.  Mouth/Throat: No oropharyngeal exudate.  Eyes: Conjunctivae and EOM are normal. Pupils are equal, round, and reactive to light. Right eye exhibits no discharge. Left eye exhibits no discharge. No scleral icterus.  Cardiovascular: Normal rate, regular rhythm, normal heart sounds and intact distal pulses.  Exam  reveals no gallop and no friction rub.   No murmur heard. Pulmonary/Chest: Effort normal and breath sounds normal. No respiratory distress. He has no wheezes. He has no rales. He exhibits no tenderness.  Abdominal: Soft. He exhibits no distension. There is no tenderness. There is no guarding.  No abdominal bruits. No pulsatile mass. No CVA tenderness.  Musculoskeletal: Normal range of motion. He exhibits no edema.  TTP of right lumbar paraspinal muscles. No midline spinal tenderness.  Full range of motion C, T, L-spine. No step-offs or obvious bony deformities.  Neurological: He is alert and oriented to person, place, and time.  Skin: Skin is warm and dry. No rash noted. He is not diaphoretic. No erythema. No pallor.  Psychiatric: He has a normal mood and affect. His behavior is normal.  Nursing note and vitals reviewed.   ED Course  Procedures (including critical care time) DIAGNOSTIC STUDIES: Oxygen Saturation is 94% on RA, adequate by my interpretation.    COORDINATION OF CARE: 3:04 PM Discussed treatment plan with pt at bedside which includes UA, lumbar spine xray, thoracic spine xray, and pt agreed to plan.    Labs Review Labs Reviewed  URINALYSIS, ROUTINE W REFLEX MICROSCOPIC (NOT AT [facility]) - Abnormal; Notable for the following:    Hgb urine dipstick TRACE (*)    All other components within normal limits  URINE MICROSCOPIC-ADD ON - Abnormal; Notable for the following:    Squamous Epithelial / LPF 0-5 (*)    Bacteria, UA RARE (*)    All other components within normal limits    Imaging Review Dg Thoracic Spine 2 View  11/17/2015  CLINICAL DATA:  Right lower back and bilateral leg pain for the past 2 days. No known injury. EXAM: THORACIC SPINE 2 VIEWS COMPARISON:  Chest radiographs dated 07/28/2015 FINDINGS: Mild to moderate anterior and lateral spur formation at multiple levels of the mid and lower thoracic spine. Lower cervical spine degenerative changes. No fractures or  subluxations. IMPRESSION: Degenerative changes.  No acute abnormality. Electronically Signed   By: Percell Locus.D.  On: 11/17/2015 16:29   Dg Lumbar Spine Complete  11/17/2015  CLINICAL DATA:  Right low back and bilateral leg pain for the past 2 days. No known injury. EXAM: LUMBAR SPINE - COMPLETE 4+ VIEW COMPARISON:  Chest, abdomen and pelvis CT dated 08/19/2014. FINDINGS: Five non-rib-bearing lumbar vertebrae. Minimal levoconvex thoracolumbar scoliosis. Mild to moderate anterior and lateral spur formation throughout the lumbar and lower thoracic spine. Facet degenerative changes in the lower lumbar spine. No fractures, pars defects or subluxations. Atheromatous arterial calcifications. IMPRESSION: 1. Lumbar and lower thoracic spine degenerative changes. 2. Atheromatous arterial calcifications. Electronically Signed   By: Claudie Revering M.D.   On: 11/17/2015 16:28   I have personally reviewed and evaluated these images and lab results as part of my medical decision-making.   EKG Interpretation None      MDM   Final diagnoses:  Right-sided low back pain without sciatica  Muscle spasm of back   Patient with back pain that is intermittent and described as a cramping sensation, likely muscle spasms.  No neurological deficits and normal neuro exam. No abdominal bruits. Doubt AAA.  Patient is ambulatory.  No loss of bowel or bladder control.  No concern for cauda equina.  No fever, night sweats, weight loss, h/o cancer, IVDA, no recent procedure to back. No urinary symptoms suggestive of UTI.  Case discussed with and pt also seen by Dr. Lajean Saver who agrees with recommended plan. Supportive care and return precaution discussed. Appears safe for discharge at this time. Follow up as indicated in discharge paperwork.     I personally performed the services described in this documentation, which was scribed in my presence. The recorded information has been reviewed and is accurate.     Dondra Spry [place], PA-C 11/20/15 [place], MD 11/21/15 [phone]

## 2015-12-24 ENCOUNTER — Encounter (HOSPITAL_COMMUNITY): Payer: Self-pay | Admitting: *Deleted

## 2015-12-24 ENCOUNTER — Observation Stay (HOSPITAL_COMMUNITY)
Admission: EM | Admit: 2015-12-24 | Discharge: 2015-12-28 | Disposition: A | Discharge status: Skilled Nursing Facility | Payer: Commercial Managed Care - HMO | Attending: Family Medicine | Admitting: Family Medicine

## 2015-12-24 ENCOUNTER — Emergency Department (HOSPITAL_COMMUNITY): Payer: Commercial Managed Care - HMO

## 2015-12-24 DIAGNOSIS — E1165 Type 2 diabetes mellitus with hyperglycemia: Secondary | ICD-10-CM | POA: Insufficient documentation

## 2015-12-24 DIAGNOSIS — Z79899 Other long term (current) drug therapy: Secondary | ICD-10-CM | POA: Diagnosis not present

## 2015-12-24 DIAGNOSIS — Y92003 Bedroom of unspecified non-institutional (private) residence as the place of occurrence of the external cause: Secondary | ICD-10-CM | POA: Insufficient documentation

## 2015-12-24 DIAGNOSIS — N189 Chronic kidney disease, unspecified: Secondary | ICD-10-CM | POA: Insufficient documentation

## 2015-12-24 DIAGNOSIS — R262 Difficulty in walking, not elsewhere classified: Secondary | ICD-10-CM | POA: Insufficient documentation

## 2015-12-24 DIAGNOSIS — R531 Weakness: Secondary | ICD-10-CM | POA: Diagnosis present

## 2015-12-24 DIAGNOSIS — L03113 Cellulitis of right upper limb: Secondary | ICD-10-CM | POA: Insufficient documentation

## 2015-12-24 DIAGNOSIS — J9610 Chronic respiratory failure, unspecified whether with hypoxia or hypercapnia: Secondary | ICD-10-CM | POA: Insufficient documentation

## 2015-12-24 DIAGNOSIS — I110 Hypertensive heart disease with heart failure: Secondary | ICD-10-CM | POA: Diagnosis present

## 2015-12-24 DIAGNOSIS — Z87891 Personal history of nicotine dependence: Secondary | ICD-10-CM | POA: Insufficient documentation

## 2015-12-24 DIAGNOSIS — Z794 Long term (current) use of insulin: Secondary | ICD-10-CM | POA: Insufficient documentation

## 2015-12-24 DIAGNOSIS — I255 Ischemic cardiomyopathy: Secondary | ICD-10-CM | POA: Insufficient documentation

## 2015-12-24 DIAGNOSIS — G4733 Obstructive sleep apnea (adult) (pediatric): Secondary | ICD-10-CM | POA: Diagnosis not present

## 2015-12-24 DIAGNOSIS — M6282 Rhabdomyolysis: Secondary | ICD-10-CM | POA: Diagnosis not present

## 2015-12-24 DIAGNOSIS — I130 Hypertensive heart and chronic kidney disease with heart failure and stage 1 through stage 4 chronic kidney disease, or unspecified chronic kidney disease: Secondary | ICD-10-CM | POA: Insufficient documentation

## 2015-12-24 DIAGNOSIS — Z955 Presence of coronary angioplasty implant and graft: Secondary | ICD-10-CM | POA: Insufficient documentation

## 2015-12-24 DIAGNOSIS — W06XXXA Fall from bed, initial encounter: Secondary | ICD-10-CM | POA: Diagnosis not present

## 2015-12-24 DIAGNOSIS — G9341 Metabolic encephalopathy: Secondary | ICD-10-CM | POA: Insufficient documentation

## 2015-12-24 DIAGNOSIS — I252 Old myocardial infarction: Secondary | ICD-10-CM | POA: Diagnosis not present

## 2015-12-24 DIAGNOSIS — Z856 Personal history of leukemia: Secondary | ICD-10-CM | POA: Diagnosis not present

## 2015-12-24 DIAGNOSIS — Z8572 Personal history of non-Hodgkin lymphomas: Secondary | ICD-10-CM | POA: Insufficient documentation

## 2015-12-24 DIAGNOSIS — E1122 Type 2 diabetes mellitus with diabetic chronic kidney disease: Secondary | ICD-10-CM | POA: Diagnosis not present

## 2015-12-24 DIAGNOSIS — Z6836 Body mass index (BMI) 36.0-36.9, adult: Secondary | ICD-10-CM | POA: Insufficient documentation

## 2015-12-24 DIAGNOSIS — E876 Hypokalemia: Secondary | ICD-10-CM | POA: Insufficient documentation

## 2015-12-24 DIAGNOSIS — Z9981 Dependence on supplemental oxygen: Secondary | ICD-10-CM | POA: Diagnosis not present

## 2015-12-24 DIAGNOSIS — F1021 Alcohol dependence, in remission: Secondary | ICD-10-CM | POA: Diagnosis not present

## 2015-12-24 DIAGNOSIS — I2510 Atherosclerotic heart disease of native coronary artery without angina pectoris: Secondary | ICD-10-CM | POA: Diagnosis not present

## 2015-12-24 DIAGNOSIS — N179 Acute kidney failure, unspecified: Secondary | ICD-10-CM | POA: Diagnosis not present

## 2015-12-24 DIAGNOSIS — I5042 Chronic combined systolic (congestive) and diastolic (congestive) heart failure: Secondary | ICD-10-CM | POA: Diagnosis not present

## 2015-12-24 DIAGNOSIS — E7800 Pure hypercholesterolemia, unspecified: Secondary | ICD-10-CM | POA: Insufficient documentation

## 2015-12-24 DIAGNOSIS — Z7982 Long term (current) use of aspirin: Secondary | ICD-10-CM | POA: Insufficient documentation

## 2015-12-24 DIAGNOSIS — I10 Essential (primary) hypertension: Secondary | ICD-10-CM | POA: Diagnosis present

## 2015-12-24 LAB — CBC WITH DIFFERENTIAL/PLATELET
Basophils Absolute: 0 10*3/uL (ref 0.0–0.1)
Basophils Relative: 0 %
Eosinophils Absolute: 0.2 10*3/uL (ref 0.0–0.7)
Eosinophils Relative: 1 %
HEMATOCRIT: 41.9 % (ref 39.0–52.0)
HEMOGLOBIN: 13.8 g/dL (ref 13.0–17.0)
LYMPHS ABS: 2.9 10*3/uL (ref 0.7–4.0)
LYMPHS PCT: 22 %
MCH: 28.8 pg (ref 26.0–34.0)
MCHC: 32.9 g/dL (ref 30.0–36.0)
MCV: 87.3 fL (ref 78.0–100.0)
MONOS PCT: 4 %
Monocytes Absolute: 0.5 10*3/uL (ref 0.1–1.0)
NEUTROS ABS: 9.7 10*3/uL — AB (ref 1.7–7.7)
NEUTROS PCT: 73 %
Platelets: 187 10*3/uL (ref 150–400)
RBC: 4.8 MIL/uL (ref 4.22–5.81)
RDW: 14.8 % (ref 11.5–15.5)
WBC: 13.3 10*3/uL — AB (ref 4.0–10.5)

## 2015-12-24 LAB — COMPREHENSIVE METABOLIC PANEL
ALK PHOS: 74 U/L (ref 38–126)
ALT: 17 U/L (ref 17–63)
AST: 32 U/L (ref 15–41)
Albumin: 3.7 g/dL (ref 3.5–5.0)
Anion gap: 20 — ABNORMAL HIGH (ref 5–15)
BILIRUBIN TOTAL: 1.7 mg/dL — AB (ref 0.3–1.2)
BUN: 74 mg/dL — ABNORMAL HIGH (ref 6–20)
CALCIUM: 9.6 mg/dL (ref 8.9–10.3)
CO2: 23 mmol/L (ref 22–32)
CREATININE: 2.51 mg/dL — AB (ref 0.61–1.24)
Chloride: 92 mmol/L — ABNORMAL LOW (ref 101–111)
GFR, EST AFRICAN AMERICAN: 28 mL/min — AB (ref >60)
GFR, EST NON AFRICAN AMERICAN: 25 mL/min — AB (ref >60)
Glucose, Bld: 224 mg/dL — ABNORMAL HIGH (ref 65–99)
Potassium: 3.1 mmol/L — ABNORMAL LOW (ref 3.5–5.1)
SODIUM: 135 mmol/L (ref 135–145)
TOTAL PROTEIN: 8.1 g/dL (ref 6.5–8.1)

## 2015-12-24 LAB — URINALYSIS, ROUTINE W REFLEX MICROSCOPIC
BILIRUBIN URINE: NEGATIVE
Glucose, UA: NEGATIVE mg/dL
Ketones, ur: 15 mg/dL — AB
Leukocytes, UA: NEGATIVE
NITRITE: NEGATIVE
PH: 5 (ref 5.0–8.0)
Protein, ur: 30 mg/dL — AB
SPECIFIC GRAVITY, URINE: 1.018 (ref 1.005–1.030)

## 2015-12-24 LAB — URINE MICROSCOPIC-ADD ON: RBC / HPF: NONE SEEN RBC/hpf (ref 0–5)

## 2015-12-24 LAB — GLUCOSE, CAPILLARY
Glucose-Capillary: 243 mg/dL — ABNORMAL HIGH (ref 65–99)
Glucose-Capillary: 279 mg/dL — ABNORMAL HIGH (ref 65–99)

## 2015-12-24 LAB — TROPONIN I
TROPONIN I: 0.13 ng/mL — AB (ref <0.031)
Troponin I: 0.09 ng/mL — ABNORMAL HIGH (ref <0.031)

## 2015-12-24 LAB — LACTIC ACID, PLASMA
LACTIC ACID, VENOUS: 1.4 mmol/L (ref 0.5–2.0)
Lactic Acid, Venous: 1.7 mmol/L (ref 0.5–2.0)

## 2015-12-24 LAB — I-STAT TROPONIN, ED: Troponin i, poc: 0.13 ng/mL (ref 0.00–0.08)

## 2015-12-24 LAB — CK
CK TOTAL: 1038 U/L — AB (ref 49–397)
CK TOTAL: 965 U/L — AB (ref 49–397)
CK TOTAL: 874 U/L — AB (ref 49–397)

## 2015-12-24 LAB — BRAIN NATRIURETIC PEPTIDE: B Natriuretic Peptide: 296.7 pg/mL — ABNORMAL HIGH (ref 0.0–100.0)

## 2015-12-24 LAB — I-STAT CG4 LACTIC ACID, ED: Lactic Acid, Venous: 3 mmol/L (ref 0.5–2.0)

## 2015-12-24 LAB — MAGNESIUM: MAGNESIUM: 2.3 mg/dL (ref 1.7–2.4)

## 2015-12-24 MED ORDER — LORAZEPAM 2 MG/ML IJ SOLN: 1.0000 mg | Freq: Once | INTRAMUSCULAR | Status: DC

## 2015-12-24 MED ORDER — CEPHALEXIN 500 MG PO CAPS
500.0000 mg | ORAL_CAPSULE | Freq: Two times a day (BID) | ORAL | Status: DC
  Administered 2015-12-24 – 2015-12-28 (×8): 500 mg via ORAL
  Filled 2015-12-24 (×8): qty 1

## 2015-12-24 MED ORDER — SODIUM CHLORIDE 0.9 % IV SOLN
INTRAVENOUS | Status: AC
  Administered 2015-12-24 – 2015-12-25 (×2): via INTRAVENOUS

## 2015-12-24 MED ORDER — ENOXAPARIN SODIUM 30 MG/0.3ML ~~LOC~~ SOLN
30.0000 mg | SUBCUTANEOUS | Status: DC
  Administered 2015-12-24: 30 mg via SUBCUTANEOUS
  Filled 2015-12-24: qty 0.3

## 2015-12-24 MED ORDER — POTASSIUM CHLORIDE CRYS ER 20 MEQ PO TBCR
40.0000 meq | EXTENDED_RELEASE_TABLET | Freq: Two times a day (BID) | ORAL | Status: AC
Start: 2015-12-24 — End: 2015-12-25
  Administered 2015-12-24 – 2015-12-25 (×2): 40 meq via ORAL
  Filled 2015-12-24 (×2): qty 2

## 2015-12-24 MED ORDER — CEFTRIAXONE SODIUM 1 G IJ SOLR: 1.0000 g | Freq: Once | INTRAMUSCULAR | Status: DC

## 2015-12-24 MED ORDER — DEXTROSE 5 % IV SOLN
1.0000 g | Freq: Once | INTRAVENOUS | Status: AC
  Administered 2015-12-24: 1 g via INTRAVENOUS
  Filled 2015-12-24: qty 10

## 2015-12-24 MED ORDER — BISACODYL 5 MG PO TBEC
5.0000 mg | DELAYED_RELEASE_TABLET | Freq: Every day | ORAL | Status: DC | PRN
  Administered 2015-12-27 – 2015-12-28 (×2): 5 mg via ORAL
  Filled 2015-12-24 (×2): qty 1

## 2015-12-24 MED ORDER — CARVEDILOL 12.5 MG PO TABS
12.5000 mg | ORAL_TABLET | Freq: Two times a day (BID) | ORAL | Status: DC
  Administered 2015-12-24 – 2015-12-28 (×8): 12.5 mg via ORAL
  Filled 2015-12-24 (×8): qty 1

## 2015-12-24 MED ORDER — POLYETHYLENE GLYCOL 3350 17 G PO PACK
17.0000 g | PACK | Freq: Every day | ORAL | Status: DC | PRN
  Administered 2015-12-26 – 2015-12-28 (×3): 17 g via ORAL
  Filled 2015-12-24 (×3): qty 1

## 2015-12-24 MED ORDER — SODIUM CHLORIDE 0.9% FLUSH
3.0000 mL | Freq: Two times a day (BID) | INTRAVENOUS | Status: DC
  Administered 2015-12-24 – 2015-12-28 (×6): 3 mL via INTRAVENOUS

## 2015-12-24 MED ORDER — INSULIN ASPART 100 UNIT/ML ~~LOC~~ SOLN
0.0000 [IU] | Freq: Three times a day (TID) | SUBCUTANEOUS | Status: DC
  Administered 2015-12-24: 5 [IU] via SUBCUTANEOUS
  Administered 2015-12-25: 15 [IU] via SUBCUTANEOUS
  Administered 2015-12-25: 3 [IU] via SUBCUTANEOUS
  Administered 2015-12-25 – 2015-12-26 (×2): 11 [IU] via SUBCUTANEOUS
  Administered 2015-12-26: 5 [IU] via SUBCUTANEOUS
  Administered 2015-12-26: 8 [IU] via SUBCUTANEOUS
  Administered 2015-12-27: 5 [IU] via SUBCUTANEOUS
  Administered 2015-12-27: 15 [IU] via SUBCUTANEOUS
  Administered 2015-12-27: 5 [IU] via SUBCUTANEOUS
  Administered 2015-12-28: 8 [IU] via SUBCUTANEOUS
  Administered 2015-12-28: 3 [IU] via SUBCUTANEOUS
  Administered 2015-12-28: 5 [IU] via SUBCUTANEOUS

## 2015-12-24 MED ORDER — ASPIRIN EC 81 MG PO TBEC
81.0000 mg | DELAYED_RELEASE_TABLET | Freq: Every day | ORAL | Status: DC
  Administered 2015-12-24 – 2015-12-28 (×5): 81 mg via ORAL
  Filled 2015-12-24 (×5): qty 1

## 2015-12-24 MED ORDER — ACETAMINOPHEN 650 MG RE SUPP: 650.0000 mg | Freq: Four times a day (QID) | RECTAL | Status: DC | PRN

## 2015-12-24 MED ORDER — ACETAMINOPHEN 325 MG PO TABS
650.0000 mg | ORAL_TABLET | Freq: Four times a day (QID) | ORAL | Status: DC | PRN
  Administered 2015-12-25 – 2015-12-28 (×3): 650 mg via ORAL
  Filled 2015-12-24 (×3): qty 2

## 2015-12-24 NOTE — ED Notes (Signed)
Dr.Delo notified of elevated troponin 

## 2015-12-24 NOTE — H&P (Signed)
History and Physical    [name] [name]:[id number] DOB: 11/14/1945 DOA: 12/24/2015  PCP: Cathlean Cower, MD  Patient coming from:   home    Chief Complaint: weakness, fall at home   HPI: [name] [name] is a 70 y.o. male with medical history significant for, but not necessarily limited to,  combined systolic and diastolic heart failure, obesity, and diabetes. Patient has not been seen by [facility] in a year or so, gets most of his care through [place] in [place] since it is free. Patient reports a heart catheterization at [facility] in mid May but I cannot locate results.  Patient presented to the emergency department by EMS after spending approximately 24 hours on the floor. He was attempting to get out of bed when his legs gave way. No loss of bowel or bladder function though patient did soil himself multiple times while on the floor . Patient denies syncopal episode. No proceeding dizziness. No chest pains, he was short of breath but had been without his continuous oxygen. Patient uses an inhaler at home during times of increased phlegm. He is a former smoker  Patient complains of swelling and discomfort in right index and middle finger. 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   ED Course:  Slightly tachy, BP stable, afeb, sats upper 90s on home 02 2 liters UA - 0-5 WBC o/w unrevealing  Review of Systems: As per HPI, otherwise 10 point review of systems negative.    Past Medical History  Diagnosis Date  . Ischemic cardiomyopathy   . Hypertension   . Hypercholesterolemia   . Lower GI bleeding 1980's    "when I was drinking alot" (06/06/2012)  . Chronic combined systolic and diastolic CHF (congestive heart failure) ([place]) 07/10/2012    a. EF 37-85%, + diastolic dysfunction by echo 12/2012.  Marland Kitchen GERD (gastroesophageal reflux disease) 07/12/2012  . Coronary artery disease     a. Cath 05/2012 with CTO of RCA; s/p BMS to severe OM-1 stenosis.  Marland Kitchen History of  alcoholism ([place])     "haven't had a drink in 23 yr" (09/12/2014)  . Moderate aortic stenosis     a. Mod by echo 12/2012.  Marland Kitchen Pancreatitis   . Diverticulosis   . Personal history of rectal adenoma 05/28/2013  . Dietary noncompliance   . Morbid obesity ([place])   . Lymphoma ([place])     a. 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  . Shortness of breath   . Myocardial infarction ([place]) 11/13  . Pneumonia 05/2012    "right after stent was put in"  . OSA on CPAP   . On home oxygen therapy     "3L; pretty much last 1 1/2 weeks" (09/12/2014)  . Type II diabetes mellitus ([place])   . History of stomach ulcers     "when I drank alot; not bothered w/them since I quit drinking"  . Arthritis     "both shoulders" (09/12/2014)    Past Surgical History  Procedure Laterality Date  . Coronary angioplasty with stent placement  06/06/2012    "1"  . Lymph gland excision  07/13/2012    Procedure: CERVICAL LYMPH GLAND EXCISION;  Surgeon: Haywood Lasso, MD;  Location: [place];  Service: General;  Laterality: Right;  . Portacath placement  07/30/2012    Procedure: INSERTION PORT-A-CATH;  Surgeon: Haywood Lasso, MD;  Location: [place];  Service: General;  Laterality: Right;  . Colonoscopy N/A 05/28/2013    Procedure: COLONOSCOPY;  Surgeon: Gatha Mayer, MD;  Location: [facility];  Service: Endoscopy;  Laterality: N/A;  . Left and right heart catheterization with coronary angiogram N/A 06/06/2012    Procedure: LEFT AND RIGHT HEART CATHETERIZATION WITH CORONARY ANGIOGRAM;  Surgeon: Pixie Casino, MD;  Location: [facility] CATH LAB;  Service: Cardiovascular;  Laterality: N/A;  . Percutaneous coronary stent intervention (pci-s)  06/06/2012    Procedure: PERCUTANEOUS CORONARY STENT INTERVENTION (PCI-S);  Surgeon: Troy Sine, MD;  Location: [facility] CATH LAB;  Service: Cardiovascular;;  . Cardiac catheterization  1//20/2013      35fench atrerial sheath    Social History   Social History  . Marital Status: Divorced    [name]  . Number of Children: 2  . Years of Education: 12   Occupational History  . retired    Social History Main Topics  . Smoking status: Former Smoker -- 2.00 packs/day for 53 years    Types: Cigarettes    Quit date: 07/18/2013  . Smokeless tobacco: Never Used  . Alcohol Use: No     Comment: Hasn't drank since 12202(former alcoholic)  . Drug Use: No  . Sexual Activity: No   Other Topics Concern  . 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   Lives by self, since '78   [name] in [place]     Regular exercise-no   Caffeine Use-no    Allergies  Allergen Reactions  . Other Hives    Poison ivy.     Family History  Problem Relation Age of Onset  . Osteoarthritis Mother   . Osteoarthritis Father   . Colon cancer Daughter   . Diabetes Father     entire family  . Heart disease Father     Prior to Admission medications   Medication Sig Start Date End Date Taking? Authorizing Provider  acetaminophen-codeine (TYLENOL #3) 300-30 MG tablet Take 1-2 tablets by mouth every 6 (six) hours as needed for moderate pain. 09/27/15  Yes Mercedes Camprubi-Soms, PA-C  albuterol (PROVENTIL HFA;VENTOLIN HFA) 108 (90 BASE) MCG/ACT inhaler Inhale 2 puffs into the lungs every 6 (six) hours as needed for wheezing or shortness of breath. 02/04/14  Yes JBiagio Borg MD  AMBULATORY NON FORMULARY MEDICATION Home O2 @@ 3 LMP for 8 hrs at night   Yes Historical Provider, MD  AMBULATORY NON FORMULARY MEDICATION CPAP   Yes Historical Provider, MD  aspirin EC 81 MG tablet Take 81 mg by mouth daily.   Yes Historical Provider, MD  atorvastatin (LIPITOR) 20 MG tablet Take 1 tablet (20 mg total) by mouth daily at 6 PM. 04/21/14  Yes PThayer Headings MD  Blood Glucose Monitoring Suppl (ACCU-CHEK AVIVA PLUS) W/DEVICE KIT Use to check blood sugars twice a  day Dx 250.00 02/07/14  Yes JBiagio Borg MD  carvedilol (COREG) 12.5 MG tablet Take 1 tablet (12.5 mg total) by mouth 2 (two) times daily. 04/21/14  Yes PThayer Headings MD  glucose blood (ACCU-CHEK AVIVA PLUS) test strip Use to check blood sugars five times a day Dx E11.9 01/14/15  Yes AElayne Snare MD  insulin aspart protamine - aspart (NOVOLOG MIX 70/30 FLEXPEN) (70-30) 100 UNIT/ML FlexPen Inject 40 Units into the skin 2 (two) times daily.   Yes Historical Provider, MD  Insulin Pen Needle (BD PEN NEEDLE NANO U/F) 32G X 4  MM MISC Use as directed 1 per day   250.02 02/21/14  Yes Biagio Borg, MD  metolazone (ZAROXOLYN) 5 MG tablet Take 1 tablet as needed if weight goes about 305 lbs 10/13/14  Yes Jolaine Artist, MD  torsemide (DEMADEX) 20 MG tablet Take 2 tablets (40 mg total) by mouth 2 (two) times daily. 01/08/15  Yes Jolaine Artist, MD  triamcinolone cream (KENALOG) 0.1 % Apply 1 application topically 2 (two) times daily. 02/10/15  Yes Biagio Borg, MD  cyclobenzaprine (FLEXERIL) 10 MG tablet Take 1 tablet (10 mg total) by mouth 2 (two) times daily as needed for muscle spasms. Patient not taking: Reported on 12/24/2015 11/17/15   Dondra Spry Dowless, PA-C  HYDROcodone-acetaminophen (NORCO/VICODIN) 5-325 MG tablet Take 2 tablets by mouth every 4 (four) hours as needed. Patient not taking: Reported on 12/24/2015 11/17/15   Samantha Tripp Dowless, PA-C  Insulin Glargine (TOUJEO SOLOSTAR) 300 UNIT/ML SOPN Inject 40 Units into the skin at bedtime. Patient not taking: Reported on 12/24/2015 12/03/14   Elayne Snare, MD  insulin lispro (HUMALOG KWIKPEN) 100 UNIT/ML KiwkPen Inject 0.2 mLs (20 Units total) into the skin daily. If cbg is <150 take 10 units, if >150 take 15 Patient not taking: Reported on 12/24/2015 12/03/14   Elayne Snare, MD  lisinopril (PRINIVIL,ZESTRIL) 10 MG tablet Take 1 tablet (10 mg total) by mouth daily. Patient not taking: Reported on 12/24/2015 04/21/14   Thayer Headings, MD  predniSONE  (DELTASONE) 50 MG tablet 1 tab po on day 1, one tab po on day 2, 1/2 tab po on days 3 and 4 Patient not taking: Reported on 12/24/2015 07/28/15   Janne Napoleon, NP    Physical Exam: Filed Vitals:   12/24/15 1330 12/24/15 1415 12/24/15 1430 12/24/15 1445  BP: 121/69 132/75 109/50 147/71  Pulse: 98 101 100 102  Resp: 34 31 23 32  SpO2: 97% 97% 96% 97%    Constitutional:  Obese, black male in NAD, calm, comfortable Filed Vitals:   12/24/15 1330 12/24/15 1415 12/24/15 1430 12/24/15 1445  BP: 121/69 132/75 109/50 147/71  Pulse: 98 101 100 102  Resp: 34 31 23 32  SpO2: 97% 97% 96% 97%   Eyes: PER, lids and conjunctivae normal ENMT: Mucous membranes are moist. Posterior pharynx clear of any exudate or lesions.Normal dentition.  Neck: normal, supple, no masses Respiratory: clear to auscultation bilaterally, no wheezing, no crackles. Normal respiratory effort. No accessory muscle use.  Cardiovascular: Mild tachycardia, rate regular.  No extremity edema. 2+ pedal pulses.  Abdomen: soft, obese, non tenderness, no masses palpated.  Bowel sounds positive.  Musculoskeletal: no clubbing / cyanosis. Base of right index and right middle finger with mild swelling and erythema and decreased mobility. He also has decreased mobility of his left fingers as well.  Skin: erythema with some streaking - right hand  Neurologic: CN 2-12 grossly intact. Sensation intact, Strength 5/5 in all 4.  Psychiatric: Normal judgment and insight. Alert and oriented x 3. Normal mood.   Labs on Admission: I have personally reviewed following labs and imaging studies  Radiological Exams on Admission: Dg Chest Port 1 View  12/24/2015  CLINICAL DATA:  Weakness for 2 days.  Fall 1 day prior EXAM: PORTABLE CHEST 1 VIEW COMPARISON:  July 28, 2015 FINDINGS: There is no edema or consolidation. The heart size and pulmonary vascularity are normal. No pneumothorax. No adenopathy. There is extensive arthropathy in both shoulders. No  acute fracture evident. There is calcification  in the left carotid artery. IMPRESSION: No edema or consolidation. No pneumothorax. Focal area of carotid artery calcification. Arthropathy in both shoulders. Electronically Signed   By: Lowella Grip III M.D.   On: 12/24/2015 13:56    EKG: Independently reviewed.   EKG Interpretation  Date/Time:  Thursday December 24 2015 11:22:29 EDT Ventricular Rate:  94 PR Interval:  198 QRS Duration: 125 QT Interval:  403 QTC Calculation: 504 R Axis:   -36 Text Interpretation:  Sinus rhythm LVH with IVCD, LAD and secondary repol abnrm Prolonged QT interval Confirmed by DELO  MD, DOUGLAS ([id number]) on 12/24/2015 1:27:03 PM      Assessment/Plan   Active Problems:   CAD, CTO RCA with collat. S/P OM1 BMS 06/06/12   HTN (hypertension)   Chronic combined systolic and diastolic CHF (congestive heart failure) (HCC)   On home oxygen therapy   Obstructive sleep apnea   Essential hypertension   Weakness   AKI (acute kidney injury) ([place])   Rhabdomyolysis      Rhabodomyolysis with AKI. CK 1038.  Fell yesterday while getting out of bed (weak legs). Stayed on floor for nearly 24 hours before able to call for help.            -admit to OBS-telemetry -cycle CK levels Q 6 hours -IV hydration (may need diuresis later) at 165m /hr for 24 hours. (conservative volume given hx of combined chronic heart failure   AKI superimposed on CKD and likely secondary to rhabdomyolysis.  Difficult to ascertain what his baseline creatinine given varying numbers over the last year but 2.4 about average for him. Creatinine 2.5 today. -IV fluid resus -Avoid nephrotoxic medications -A.m. labs     Elevated trop 0.13. No chest pain. Suspect secondary to AKA  -Cycle troponins -Monitor on telemetry  Ischemic cardiomyopathic /combined systolic and diastolic heart failure. [facility]  [place]  .No evidence for volume overload on CXR Last echo locally was March 2016- shows LV about 40% (difficult images). BNP 296. No evidence for volume overload at present -Hold home diuretics today -[facility] Told  valve problems and needing replacement. He does have known aortic stenosis -I+0 -Daily wts  Weakness - lower extremities, leading to fall at home -PT, OT evaluation   Cellulitis. Right hand / 2nd and 3rd right fingers with erythema and swelling. He had a cath in May, right radial? artery. Received dose of Rocephin in ED -Keflex 5076mTID. If renal function declines further then will need to adjust dose.    Chronic respiratory failure on home 02, No increased 02 requirement, patient asked staff to increase 02 to 4 liters.  -Continue O2, wean to 2-3 liters when able.   Obstructive sleep apnea -CPAP daily at bedtime  Prolonged QTc on EKG -Avoid QT prolonging medications -Monitor on telemetry -Replete potassium  -Obtain magnesium level obtain  Hypokalemia, K+ 3.0 -Replete with Kdur 401mbid -Am bmet, check mg+  DM2.   . -Hold basal insulin -CBGs with moderate sliding scale insulin  Hx CCL, s/p chemo 2014.   DVT prophylaxis:   Lovenox, reduced dose Code Status:   Full code    Family Communication:  none  Disposition Plan: Discharge home in 24-48 hours              Consults called:    None  Admission status:  Observation -  telemetry   Tye Savoy NP [facility] Pager [phone]  If 7PM-7AM, please contact night-coverage [email or web address] Password [email or web address]  12/24/2015, 3:10 PM

## 2015-12-24 NOTE — ED Provider Notes (Addendum)
CSN: [id number]     Arrival date & time 12/24/15  1110 History   First MD Initiated Contact with Patient 12/24/15 1154     Chief Complaint  Patient presents with  . Fall  . Extremity Weakness     (Consider location/radiation/quality/duration/timing/severity/associated sxs/prior Treatment) HPI Comments: Patient is a 70 year old male with history of ischemic cardiomyopathy and valvular heart disease. He presents with complaints of shortness of breath, right hand pain, weakness, he was brought in after a fall. He was on the ground for a prolonged period of time prior to summoning help. He denies any fevers or chills. He denies any chest pain or difficulty breathing.  Patient is a 70 y.o. male presenting with fall and extremity weakness. The history is provided by the patient.  Fall This is a new problem. The current episode started yesterday. The problem occurs constantly. The problem has not changed since onset.Associated symptoms include shortness of breath. Pertinent negatives include no chest pain and no abdominal pain. Nothing aggravates the symptoms. Nothing relieves the symptoms. He has tried nothing for the symptoms.  Extremity Weakness Associated symptoms include shortness of breath. Pertinent negatives include no chest pain and no abdominal pain.    Past Medical History  Diagnosis Date  . Ischemic cardiomyopathy   . Hypertension   . Hypercholesterolemia   . Lower GI bleeding 1980's    "when I was drinking alot" (06/06/2012)  . Chronic combined systolic and diastolic CHF (congestive heart failure) ([place]) 07/10/2012    a. EF 78-29%, + diastolic dysfunction by echo 12/2012.  Marland Kitchen GERD (gastroesophageal reflux disease) 07/12/2012  . Coronary artery disease     a. Cath 05/2012 with CTO of RCA; s/p BMS to severe OM-1 stenosis.  Marland Kitchen History of alcoholism ([place])     "haven't had a drink in 23 yr" (09/12/2014)  . Moderate aortic stenosis     a. Mod by echo 12/2012.  Marland Kitchen Pancreatitis   .  Diverticulosis   . Personal history of rectal adenoma 05/28/2013  . Dietary noncompliance   . Morbid obesity ([place])   . Lymphoma ([place])     a. 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  . Shortness of breath   . Myocardial infarction ([place]) 11/13  . Pneumonia 05/2012    "right after stent was put in"  . OSA on CPAP   . On home oxygen therapy     "3L; pretty much last 1 1/2 weeks" (09/12/2014)  . Type II diabetes mellitus ([place])   . History of stomach ulcers     "when I drank alot; not bothered w/them since I quit drinking"  . Arthritis     "both shoulders" (09/12/2014)   Past Surgical History  Procedure Laterality Date  . Coronary angioplasty with stent placement  06/06/2012    "1"  . Lymph gland excision  07/13/2012    Procedure: CERVICAL LYMPH GLAND EXCISION;  Surgeon: Haywood Lasso, MD;  Location: [place];  Service: General;  Laterality: Right;  . Portacath placement  07/30/2012    Procedure: INSERTION PORT-A-CATH;  Surgeon: Haywood Lasso, MD;  Location: [place];  Service: General;  Laterality: Right;  . Colonoscopy N/A 05/28/2013    Procedure: COLONOSCOPY;  Surgeon: Gatha Mayer, MD;  [facility];  Service: Endoscopy;  Laterality: N/A;  . Left and right heart catheterization with coronary angiogram N/A 06/06/2012    Procedure: LEFT AND RIGHT HEART CATHETERIZATION WITH CORONARY  ANGIOGRAM;  Surgeon: Pixie Casino, MD;  Location: [facility] CATH LAB;  Service: Cardiovascular;  Laterality: N/A;  . Percutaneous coronary stent intervention (pci-s)  06/06/2012    Procedure: PERCUTANEOUS CORONARY STENT INTERVENTION (PCI-S);  Surgeon: Troy Sine, MD;  Location: [facility] CATH LAB;  Service: Cardiovascular;;  . Cardiac catheterization  1//20/2013    69fench atrerial sheath   Family History  Problem Relation Age of Onset  . Osteoarthritis Mother   . Osteoarthritis Father   . Colon  cancer Daughter   . Diabetes Father     entire family  . Heart disease Father    Social History  Substance Use Topics  . Smoking status: Former Smoker -- 2.00 packs/day for 53 years    Types: Cigarettes    Quit date: 07/18/2013  . Smokeless tobacco: Never Used  . Alcohol Use: No     Comment: Hasn't drank since 19562(former alcoholic)    Review of Systems  Respiratory: Positive for shortness of breath.   Cardiovascular: Negative for chest pain.  Gastrointestinal: Negative for abdominal pain.  Musculoskeletal: Positive for extremity weakness.  All other systems reviewed and are negative.     Allergies  Other  Home Medications   Prior to Admission medications   Medication Sig Start Date End Date Taking? Authorizing Provider  acetaminophen-codeine (TYLENOL #3) 300-30 MG tablet Take 1-2 tablets by mouth every 6 (six) hours as needed for moderate pain. 09/27/15   Mercedes Camprubi-Soms, PA-C  albuterol (PROVENTIL HFA;VENTOLIN HFA) 108 (90 BASE) MCG/ACT inhaler Inhale 2 puffs into the lungs every 6 (six) hours as needed for wheezing or shortness of breath. Patient not taking: Reported on 11/17/2015 02/04/14   JBiagio Borg MD  AMBULATORY NON [place] @@ 3 LMP for 8 hrs at night    Historical Provider, MD  ABriarcliffe AcresCPAP    Historical Provider, MD  aspirin EC 81 MG tablet Take 81 mg by mouth daily.    Historical Provider, MD  atorvastatin (LIPITOR) 20 MG tablet Take 1 tablet (20 mg total) by mouth daily at 6 PM. 04/21/14   PThayer Headings MD  Blood Glucose Monitoring Suppl (ACCU-CHEK AVIVA PLUS) W/DEVICE KIT Use to check blood sugars twice a day Dx 250.00 02/07/14   JBiagio Borg MD  carvedilol (COREG) 12.5 MG tablet Take 1 tablet (12.5 mg total) by mouth 2 (two) times daily. 04/21/14   PThayer Headings MD  cyclobenzaprine (FLEXERIL) 10 MG tablet Take 1 tablet (10 mg total) by mouth 2 (two) times daily as needed for muscle spasms. 11/17/15    Samantha Tripp Dowless, PA-C  glucose blood (ACCU-CHEK AVIVA PLUS) test strip Use to check blood sugars five times a day Dx E11.9 01/14/15   AElayne Snare MD  HYDROcodone-acetaminophen (NORCO/VICODIN) 5-325 MG tablet Take 2 tablets by mouth every 4 (four) hours as needed. 11/17/15   Samantha Tripp Dowless, PA-C  Insulin Glargine (TOUJEO SOLOSTAR) 300 UNIT/ML SOPN Inject 40 Units into the skin at bedtime. Patient not taking: Reported on 11/17/2015 12/03/14   AElayne Snare MD  insulin lispro (HUMALOG KWIKPEN) 100 UNIT/ML KiwkPen Inject 0.2 mLs (20 Units total) into the skin daily. If cbg is <150 take 10 units, if >150 take 15 Patient not taking: Reported on 11/17/2015 12/03/14   AElayne Snare MD  Insulin Pen Needle (BD PEN NEEDLE NANO U/F) 32G X 4 MM MISC Use as directed 1 per day   250.02 02/21/14   JBiagio Borg MD  lisinopril (PRINIVIL,ZESTRIL) 10 MG tablet Take 1 tablet (10 mg total) by mouth daily. 04/21/14   Thayer Headings, MD  metolazone (ZAROXOLYN) 5 MG tablet Take 1 tablet as needed if weight goes about 305 lbs 10/13/14   Jolaine Artist, MD  predniSONE (DELTASONE) 50 MG tablet 1 tab po on day 1, one tab po on day 2, 1/2 tab po on days 3 and 4 07/28/15   Janne Napoleon, NP  torsemide (DEMADEX) 20 MG tablet Take 2 tablets (40 mg total) by mouth 2 (two) times daily. 01/08/15   Jolaine Artist, MD  triamcinolone cream (KENALOG) 0.1 % Apply 1 application topically 2 (two) times daily. 02/10/15   Biagio Borg, MD   SpO2 98% Physical Exam  Constitutional: He is oriented to person, place, and time. He appears well-developed and well-nourished. No distress.  HENT:  Head: Normocephalic and atraumatic.  Mouth/Throat: Oropharynx is clear and moist.  Neck: Normal range of motion. Neck supple.  Cardiovascular: Normal rate and regular rhythm.  Exam reveals no friction rub.   No murmur heard. Pulmonary/Chest: Effort normal and breath sounds normal. No respiratory distress. He has no wheezes. He has no rales.   Abdominal: Soft. Bowel sounds are normal. He exhibits no distension. There is no tenderness.  Musculoskeletal: Normal range of motion. He exhibits no edema.  There is redness, warmth, swelling, and tenderness to the right forearm in the area of the prior heart cath. Capillary refill is brisk. Ulnar and radial pulses are palpable.  Neurological: He is alert and oriented to person, place, and time. Coordination normal.  Skin: Skin is warm and dry. He is not diaphoretic.  Nursing note and vitals reviewed.   ED Course  Procedures (including critical care time) Labs Review Labs Reviewed  CBC WITH DIFFERENTIAL/PLATELET - Abnormal; Notable for the following:    WBC 13.3 (*)    Neutro Abs 9.7 (*)    All other components within normal limits  I-STAT CG4 LACTIC ACID, ED - Abnormal; Notable for the following:    Lactic Acid, Venous 3.00 (*)    All other components within normal limits  URINE CULTURE  COMPREHENSIVE METABOLIC PANEL  URINALYSIS, ROUTINE W REFLEX MICROSCOPIC (NOT AT [facility])  BRAIN NATRIURETIC PEPTIDE  CK  I-STAT TROPOININ, ED    Imaging Review No results found. I have personally reviewed and evaluated these images and lab results as part of my medical decision-making.   EKG Interpretation   Date/Time:  Thursday December 24 2015 11:22:29 EDT Ventricular Rate:  94 PR Interval:  198 QRS Duration: 125 QT Interval:  403 QTC Calculation: 504 R Axis:   -36 Text Interpretation:  Sinus rhythm LVH with IVCD, LAD and secondary repol  abnrm Prolonged QT interval Confirmed by Synthia Fairbank  MD, Jaymere Alen ([id number]) on  12/24/2015 1:27:03 PM      MDM   Final diagnoses:  None    Patient with history of ischemic cardiomyopathy and valvular heart disease. He presents for evaluation of weakness, right hand pain, and feeling short of breath. His workup reveals what appears to be a right hand cellulitis likely resulting from his prior heart cath 2 weeks ago. He also has a mildly elevated lactate and  troponin. His CK is 1000, possibly related to being on the floor for 24 hours.  I do not feel as though this patient is safe to return to his current living environment as he lives by himself. He was on the floor for 24 hours and was  unable to summon help. I've spoken with the mid-level with the hospitalist service who agrees the patient should be admitted. He will be admitted under the care of Dr. Marily Memos.    Veryl Speak, MD 12/24/15 [place], MD 02/05/16 [phone]
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
## 2015-12-24 NOTE — ED Notes (Signed)
Pt attempting to use urinal.

## 2015-12-24 NOTE — ED Notes (Addendum)
Dr. Stark Jock notified of elevated lactic acid.

## 2015-12-25 DIAGNOSIS — I10 Essential (primary) hypertension: Secondary | ICD-10-CM

## 2015-12-25 DIAGNOSIS — G4733 Obstructive sleep apnea (adult) (pediatric): Secondary | ICD-10-CM | POA: Diagnosis not present

## 2015-12-25 DIAGNOSIS — T796XXS Traumatic ischemia of muscle, sequela: Secondary | ICD-10-CM | POA: Diagnosis not present

## 2015-12-25 DIAGNOSIS — L03113 Cellulitis of right upper limb: Secondary | ICD-10-CM | POA: Diagnosis not present

## 2015-12-25 DIAGNOSIS — N179 Acute kidney failure, unspecified: Secondary | ICD-10-CM

## 2015-12-25 LAB — URINE CULTURE

## 2015-12-25 LAB — GLUCOSE, CAPILLARY
GLUCOSE-CAPILLARY: 293 mg/dL — AB (ref 65–99)
GLUCOSE-CAPILLARY: 361 mg/dL — AB (ref 65–99)
GLUCOSE-CAPILLARY: 304 mg/dL — AB (ref 65–99)
Glucose-Capillary: 257 mg/dL — ABNORMAL HIGH (ref 65–99)

## 2015-12-25 LAB — CBC
HEMATOCRIT: 37.3 % — AB (ref 39.0–52.0)
HEMOGLOBIN: 12.4 g/dL — AB (ref 13.0–17.0)
MCH: 28.7 pg (ref 26.0–34.0)
MCHC: 33.2 g/dL (ref 30.0–36.0)
MCV: 86.3 fL (ref 78.0–100.0)
Platelets: 185 10*3/uL (ref 150–400)
RBC: 4.32 MIL/uL (ref 4.22–5.81)
RDW: 14.7 % (ref 11.5–15.5)
WBC: 11.2 10*3/uL — ABNORMAL HIGH (ref 4.0–10.5)

## 2015-12-25 LAB — BASIC METABOLIC PANEL
ANION GAP: 15 (ref 5–15)
BUN: 79 mg/dL — AB (ref 6–20)
CHLORIDE: 95 mmol/L — AB (ref 101–111)
CO2: 25 mmol/L (ref 22–32)
Calcium: 9.2 mg/dL (ref 8.9–10.3)
Creatinine, Ser: 2.4 mg/dL — ABNORMAL HIGH (ref 0.61–1.24)
GFR calc Af Amer: 30 mL/min — ABNORMAL LOW (ref >60)
GFR, EST NON AFRICAN AMERICAN: 26 mL/min — AB (ref >60)
GLUCOSE: 258 mg/dL — AB (ref 65–99)
POTASSIUM: 3.6 mmol/L (ref 3.5–5.1)
Sodium: 135 mmol/L (ref 135–145)

## 2015-12-25 LAB — CK
Total CK: 733 U/L — ABNORMAL HIGH (ref 49–397)
Total CK: 647 U/L — ABNORMAL HIGH (ref 49–397)

## 2015-12-25 MED ORDER — INSULIN GLARGINE 100 UNIT/ML ~~LOC~~ SOLN
30.0000 [IU] | Freq: Every day | SUBCUTANEOUS | Status: DC
  Administered 2015-12-25 – 2015-12-27 (×3): 30 [IU] via SUBCUTANEOUS
  Filled 2015-12-25 (×5): qty 0.3

## 2015-12-25 MED ORDER — ALBUTEROL SULFATE (2.5 MG/3ML) 0.083% IN NEBU
2.5000 mg | INHALATION_SOLUTION | Freq: Four times a day (QID) | RESPIRATORY_TRACT | Status: DC | PRN
  Administered 2015-12-25 – 2015-12-27 (×3): 2.5 mg via RESPIRATORY_TRACT
  Filled 2015-12-25 (×3): qty 3

## 2015-12-25 MED ORDER — TRAMADOL HCL 50 MG PO TABS
50.0000 mg | ORAL_TABLET | Freq: Four times a day (QID) | ORAL | Status: DC | PRN
  Administered 2015-12-25 – 2015-12-28 (×7): 100 mg via ORAL
  Filled 2015-12-25 (×8): qty 2

## 2015-12-25 MED ORDER — ENOXAPARIN SODIUM 40 MG/0.4ML ~~LOC~~ SOLN
40.0000 mg | SUBCUTANEOUS | Status: DC
  Administered 2015-12-25: 40 mg via SUBCUTANEOUS
  Filled 2015-12-25: qty 0.4

## 2015-12-25 NOTE — Progress Notes (Signed)
Pt refusing CPAP for tonight 

## 2015-12-25 NOTE — Progress Notes (Signed)
PROGRESS NOTE    [name] [name]  [id number] DOB: 16-Nov-1945 DOA: 12/24/2015 PCP: Cathlean Cower, MD   Brief Narrative:  70 y.o. 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 No evidence at this time that patient's elevated troponin is due to cardiac ischemia   Assessment & Plan:   Active Problems:   CAD, CTO RCA with collat. S/P OM1 BMS 06/06/12 - Aspirin  Right hand cellulitis - continue pain control and antibiotic    Chronic combined systolic and diastolic CHF (congestive heart failure) ([place])   On home oxygen therapy   Obstructive sleep apnea - continue home medication regimen    Essential hypertension - continue b blocker, relatively well controlled    Weakness    AKI (acute kidney injury) ([place]) - improving    Rhabdomyolysis - trending down. Was secondary to prolonged time being down.    Cellulitis of right hand - Keflex  DM no well controlled - Will place on Lantus and SSI - diabetic diet   DVT prophylaxis: lovenox Code Status: full Family Communication: None at bedside Disposition Plan: pending improvement in condition   Consultants:   None   Procedures: None  Antimicrobials: Keflex  Subjective: No new complaints. His right hand still hurts at the dorsum  Objective: Filed Vitals:   12/25/15 0101 12/25/15 0441 12/25/15 0757 12/25/15 1119  BP: 141/65 142/74 130/57 120/67  Pulse: 93 87 88 96  Temp: 97.1 F (36.2 C) 98.2 F (36.8 C) 98.1 F (36.7 C) 98.6 F (37 C)  TempSrc: Oral Oral Oral Oral  Resp: 18 18 18 18   Height:      Weight:  127.098 kg (280 lb 3.2 oz)    SpO2: 96% 96% 100% 100%    Intake/Output Summary (Last 24 hours) at 12/25/15  1526 Last data filed at 12/25/15 1326  Gross per 24 hour  Intake    880 ml  Output    700 ml  Net    180 ml   Filed Weights   12/24/15 1608 12/25/15 0441  Weight: 126.191 kg (278 lb 3.2 oz) 127.098 kg (280 lb 3.2 oz)    Examination:  General exam: Appears calm and comfortable  Respiratory system: Clear to auscultation. Respiratory effort normal. Cardiovascular system: S1 & S2 heard, RRR.  Gastrointestinal system: Abdomen is nondistended, soft and nontender. No organomegaly or masses felt. Normal bowel sounds heard. Central nervous system: Alert and oriented.equal tone BL Extremities: Symmetric 5 x 5 power. Skin: right wrist cellulitis and hand cellulitis over dorsum of hand no pain on palpation over palm, no errythema over palm Psychiatry:  Mood & affect appropriate.     Data Reviewed: I have personally reviewed following labs and imaging studies  CBC:  Recent Labs Lab 12/24/15 1145 12/25/15 0432  WBC 13.3* 11.2*  NEUTROABS 9.7*  --   HGB 13.8 12.4*  HCT 41.9 37.3*  MCV 87.3 86.3  PLT 187 [id number]   Basic Metabolic Panel:  Recent Labs Lab 12/24/15 1145 12/24/15 1756 12/25/15 0432  NA 135  --  135  K 3.1*  --  3.6  CL 92*  --  95*  CO2 23  --  25  GLUCOSE 224*  --  258*  BUN 74*  --  79*  CREATININE 2.51*  --  2.40*  CALCIUM 9.6  --  9.2  MG  --  2.3  --    GFR: Estimated Creatinine Clearance: 41.2 mL/min (by C-G formula based on Cr of 2.4). Liver Function Tests:  Recent Labs Lab 12/24/15 1145  AST 32  ALT 17  ALKPHOS 74  BILITOT 1.7*  PROT 8.1  ALBUMIN 3.7   No results for input(s): LIPASE, AMYLASE in the last 168 hours. No results for input(s): AMMONIA in the last 168 hours. Coagulation Profile: No results for input(s): INR, PROTIME in the last 168 hours. Cardiac Enzymes:  Recent Labs Lab 12/24/15 1214 12/24/15 1559 12/24/15 1756 12/24/15 2240 12/25/15 0432 12/25/15 1100  CKTOTAL 1038*  --  965* 874* 733* 647*  TROPONINI  --  0.13*  --   0.09*  --   --    BNP (last 3 results) No results for input(s): PROBNP in the last 8760 hours. HbA1C: No results for input(s): HGBA1C in the last 72 hours. CBG:  Recent Labs Lab 12/24/15 1834 12/24/15 2114 12/25/15 0607 12/25/15 1123  GLUCAP 243* 279* 293* 361*   Lipid Profile: No results for input(s): CHOL, HDL, LDLCALC, TRIG, CHOLHDL, LDLDIRECT in the last 72 hours. Thyroid Function Tests: No results for input(s): TSH, T4TOTAL, FREET4, T3FREE, THYROIDAB in the last 72 hours. Anemia Panel: No results for input(s): VITAMINB12, FOLATE, FERRITIN, TIBC, IRON, RETICCTPCT in the last 72 hours. Sepsis Labs:  Recent Labs Lab 12/24/15 1203 12/24/15 1912 12/24/15 2240  LATICACIDVEN 3.00* 1.4 1.7    Recent Results (from the past 240 hour(s))  Urine culture     Status: Abnormal   Collection Time: 12/24/15  2:31 PM  Result Value Ref Range Status   Specimen Description URINE, CLEAN CATCH  Final   Special Requests NONE  Final   Culture 4,000 COLONIES/mL INSIGNIFICANT GROWTH (A)  Final   Report Status 12/25/2015 FINAL  Final         Radiology Studies: Dg Chest Port 1 View  12/24/2015  CLINICAL DATA:  Weakness for 2 days.  Fall 1 day prior EXAM: PORTABLE CHEST 1 VIEW COMPARISON:  July 28, 2015 FINDINGS: There is no edema or consolidation. The heart size and pulmonary vascularity are normal. No pneumothorax. No adenopathy. There is extensive arthropathy in both shoulders. No acute fracture evident. There is calcification in the left carotid artery. IMPRESSION: No edema or consolidation. No pneumothorax. Focal area of carotid artery calcification. Arthropathy in both shoulders. Electronically Signed   By: Lowella Grip III M.D.   On: 12/24/2015 13:56        Scheduled Meds: . aspirin EC  81 mg Oral Daily  . carvedilol  12.5 mg Oral BID  . cephALEXin  500 mg Oral Q12H  . enoxaparin (LOVENOX) injection  40 mg Subcutaneous Q24H  . insulin aspart  0-15 Units Subcutaneous  TID WC  . sodium chloride flush  3 mL Intravenous Q12H   Continuous Infusions: . sodium chloride 100 mL/hr at 12/25/15 0505     LOS: 1 day    Time spent: > 35 minutes  Velvet Bathe, MD [facility] Pager [phone]  If 7PM-7AM, please contact night-coverage [email or web address] Password [email or web address] 12/25/2015, 3:26 PM

## 2015-12-25 NOTE — Evaluation (Signed)
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 Pt's PMH includes ischemic cardiomyopathy, GI bleed, CAD, EtOH use, lymphoma, on home O2.     Clinical Impression  Pt admitted with above diagnosis. Pt currently with functional limitations due to the deficits listed below (see PT Problem List). PTA pt independent but w/ back spasms 1x/month for the past few months that leave him dependent on assist sending him to the ED.  He currently requires mod assist for bed mobility and transfers.  Recommending SNF at d/c.  Pt will benefit from skilled PT to increase their independence and safety with mobility to allow discharge to the venue listed below.      Follow Up Recommendations SNF;Supervision for mobility/OOB    Equipment Recommendations  Other (comment) (TBD as pt progresses)    Recommendations for Other Services OT consult     Precautions / Restrictions Precautions Precautions: Fall Restrictions Weight Bearing Restrictions: No      Mobility  Bed Mobility Overal bed mobility: Needs Assistance Bed Mobility: Supine to Sit     Supine to sit: Mod assist;HOB elevated     General bed mobility comments: Pt refuses to roll despite education on benefits of log roll.  Increased time w/ all mobility and assist w/ managing Bil LEs and elevating trunk.  Transfers Overall transfer level: Needs assistance Equipment used: None Transfers: Sit to/from [facility] Sit to Stand: Mod assist;From elevated surface Stand pivot transfers: Min assist       General transfer comment: Used momentum by rocking and counting to 3 w/ mod assist to boost to standing w/ bed elevated.  No evidence of knee buckling, pt able  to pivot w/ min assist to steady w/ directional cues.  Ambulation/Gait         Gait velocity: deferred for pt/therapist safety      Stairs            Wheelchair Mobility    Modified Rankin (Stroke Patients Only)       Balance Overall balance assessment: Needs assistance;History of Falls Sitting-balance support: Single extremity supported;Feet supported Sitting balance-Leahy Scale: Poor Sitting balance - Comments: Relies on UE support sitting EOB   Standing balance support: During functional activity;Single extremity supported Standing balance-Leahy Scale: Poor Standing balance comment: Required physical assist to steady                             Pertinent Vitals/Pain Pain Assessment: Faces Pain Score:  (pt denying pain) Faces Pain Scale: Hurts whole lot Pain Location: Rt lower back; generalized guarding and flinching w/ any mobility Pain Descriptors / Indicators: Guarding;Grimacing Pain Intervention(s): Limited activity within patient's tolerance;Monitored during session;Repositioned;Utilized relaxation techniques    Home Living Family/patient expects to be discharged to:: Private residence Living Arrangements: Alone Available Help at Discharge: Family;Available PRN/intermittently Type of Home: House Home Access: Stairs to enter Entrance Stairs-Rails: None Entrance Stairs-Number of Steps: 1 (small step up) Home Layout: One level Home Equipment: Cane - single point      Prior Function Level of Independence: Independent         Comments: Retired, Water engineer.  Says he has been having back spasms 1x/month for several months, each  episode lasting ~4 hrs.       Hand Dominance   Dominant Hand: Right    Extremity/Trunk Assessment   Upper Extremity Assessment: Defer to OT evaluation (swollen Rt hand, pt refuses any Rt UE mobility)           Lower Extremity Assessment: RLE deficits/detail;LLE deficits/detail RLE Deficits / Details:  Pt refuses mobility to allow for MMT; however pt functionally able to pivot and stand w/ min assist LLE Deficits / Details: Pt refuses mobility to allow for MMT; however pt functionally able to pivot and stand w/ min assist  Cervical / Trunk Assessment: Other exceptions  Communication   Communication: No difficulties  Cognition Arousal/Alertness: Awake/alert Behavior During Therapy: Anxious Overall Cognitive Status: Within Functional Limits for tasks assessed                      General Comments      Exercises        Assessment/Plan    PT Assessment Patient needs continued PT services  PT Diagnosis Difficulty walking;Generalized weakness;Acute pain   PT Problem List Decreased strength;Decreased mobility;Decreased balance;Decreased activity tolerance;Decreased range of motion;Decreased knowledge of use of DME;Decreased safety awareness;Cardiopulmonary status limiting activity;Pain  PT Treatment Interventions DME instruction;Gait training;Functional mobility training;Therapeutic activities;Therapeutic exercise;Balance training;Patient/family education;Modalities   PT Goals (Current goals can be found in the Care Plan section) Acute Rehab PT Goals Patient Stated Goal: to feel better PT Goal Formulation: With patient Time For Goal Achievement: 01/08/16 Potential to Achieve Goals: Good    Frequency Min 3X/week   Barriers to discharge Decreased caregiver support Lives alone    Co-evaluation               End of Session Equipment Utilized During Treatment: Gait belt;Oxygen Activity Tolerance: Patient limited by pain;Patient limited by fatigue Patient left: in chair;with call bell/phone within reach;with chair alarm set Nurse Communication: Mobility status    Functional Assessment Tool Used: Clinical Judgement Functional Limitation: Mobility: Walking and moving around Mobility: Walking and Moving Around Current Status [id number]): At least 20 percent but less than  40 percent impaired, limited or restricted Mobility: Walking and Moving Around Goal Status [phone]): At least 1 percent but less than 20 percent impaired, limited or restricted    Time: 0916-0952 PT Time Calculation (min) (ACUTE ONLY): 36 min   Charges:   PT Evaluation $PT Eval Moderate Complexity: 1 Procedure PT Treatments $Therapeutic Activity: 8-22 mins   PT G Codes:   PT G-Codes **NOT FOR INPATIENT CLASS** Functional Assessment Tool Used: Clinical Judgement Functional Limitation: Mobility: Walking and moving around Mobility: Walking and Moving Around Current Status [id number]): At least 20 percent but less than 40 percent impaired, limited or restricted Mobility: Walking and Moving Around Goal Status [phone]): At least 1 percent but less than 20 percent impaired, limited or restricted   Collie Siad PT, DPT  Pager: [phone] Phone: [phone] 12/25/2015, 10:11 AM

## 2015-12-25 NOTE — NC FL2 (Signed)
[place] LEVEL OF CARE SCREENING TOOL     IDENTIFICATION  Patient Name: [name] [name] Birthdate: Jun 15, 1946 Sex: male Admission Date (Current Location): 12/24/2015  [facility] and [place] Number:  Herbalist and Address:  [facility] [place] [address], [place], [place] [address]      Provider Number: [id number]  Attending Physician Name and Address:  Velvet Bathe, MD  Relative Name and Phone Number:       Current Level of Care: Hospital Recommended Level of Care: [place] Prior Approval Number:    Date Approved/Denied:   PASRR Number: [id number]  Discharge Plan: SNF    Current Diagnoses: Patient Active Problem List   Diagnosis Date Noted  . Cellulitis of right hand   . Weakness 12/24/2015  . AKI (acute kidney injury) ([place]) 12/24/2015  . Rhabdomyolysis 12/24/2015  . Rash and nonspecific skin eruption 02/10/2015  . Coronary artery disease involving coronary bypass graft of native heart without angina pectoris   . Obstructive sleep apnea   . Essential hypertension   . CHF (congestive heart failure), NYHA class III ([place]) 09/12/2014  . Dyspnea 08/12/2014  . Obesity (BMI 30-39.9) 03/27/2014  . Scrotal swelling 03/22/2014  . 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 Preventative health care 02/04/2014  . Chronic renal insufficiency, stage III (moderate) 08/06/2013  . Acute renal insufficiency 06/26/2013  . Personal history of rectal adenoma 05/28/2013  . Diverticulosis of colon (without mention of hemorrhage) 05/28/2013  . On home oxygen therapy 05/22/2013  . Special screening for malignant neoplasms, colon 05/22/2013  . Abdominal aortic atherosclerosis ([place]) 05/22/2013  . Arthritis of shoulder 03/29/2013  . Acromioclavicular joint arthritis 03/29/2013  . DOE (dyspnea on exertion) 12/21/2012  . Lymphoma ([place]) 07/17/2012  . GERD (gastroesophageal reflux disease) 07/12/2012  . Chronic  combined systolic and diastolic CHF (congestive heart failure) ([place]) 07/10/2012  . CAD, CTO RCA with collat. S/P OM1 BMS 06/06/12 06/07/2012  . HTN (hypertension) 06/07/2012  . Diabetes mellitus, Type 2 NIDDM 06/07/2012  . Acute on chronic combined systolic and diastolic congestive heart failure ([place]) 06/07/2012  . AS, moderate by echo 04/08/14- EF 55-60% 06/07/2012  . Dyslipidemia, LDL 105 06/07/2012  . Smoker 06/07/2012    Orientation RESPIRATION BLADDER Height & Weight     Self, Time, Situation, Place  O2 (Nasal Canula 4 L) Continent Weight: 280 lb 3.2 oz (127.098 kg) Height:  6\' 2"  (188 cm)  BEHAVIORAL SYMPTOMS/MOOD NEUROLOGICAL BOWEL NUTRITION STATUS   (None)  (None) Continent    AMBULATORY STATUS COMMUNICATION OF NEEDS Skin   Limited Assist Verbally Skin abrasions, Other (Comment) (Abrasion: Bilateral elbow. Cellulitis right hand.)                       Personal Care Assistance Level of Assistance  Bathing, Feeding, Dressing Bathing Assistance: Limited assistance Feeding assistance: Independent Dressing Assistance: Limited assistance     Functional Limitations Info  Sight, Hearing, Speech Sight Info: Adequate Hearing Info: Adequate Speech Info: Adequate    SPECIAL CARE FACTORS FREQUENCY  Blood pressure, Diabetic urine testing, PT (By licensed PT)     PT Frequency: 5 x week              Contractures Contractures Info: Not present    Additional Factors Info  Code Status, Allergies Code Status Info: Full Allergies Info: "Other" Per chart. None listed.  Current Medications (12/25/2015):  This is the current hospital active medication list Current Facility-Administered Medications  Medication Dose Route Frequency Provider Last Rate Last Dose  . 0.9 %  sodium chloride infusion   Intravenous Continuous Willia Craze, NP 100 mL/hr at 12/25/15 0505    . acetaminophen (TYLENOL) tablet 650 mg  650 mg Oral Q6H PRN Willia Craze, NP   650 mg at  12/25/15 0749   Or  . acetaminophen (TYLENOL) suppository 650 mg  650 mg Rectal Q6H PRN Willia Craze, NP      . aspirin EC tablet 81 mg  81 mg Oral Daily Willia Craze, NP   81 mg at 12/25/15 1004  . bisacodyl (DULCOLAX) EC tablet 5 mg  5 mg Oral Daily PRN Willia Craze, NP      . carvedilol (COREG) tablet 12.5 mg  12.5 mg Oral BID Willia Craze, NP   12.5 mg at 12/25/15 1004  . cephALEXin (KEFLEX) capsule 500 mg  500 mg Oral Q12H Willia Craze, NP   500 mg at 12/25/15 1004  . enoxaparin (LOVENOX) injection 30 mg  30 mg Subcutaneous Q24H Willia Craze, NP   30 mg at 12/24/15 2146  . insulin aspart (novoLOG) injection 0-15 Units  0-15 Units Subcutaneous TID WC Willia Craze, NP   15 Units at 12/25/15 1231  . polyethylene glycol (MIRALAX / GLYCOLAX) packet 17 g  17 g Oral Daily PRN Willia Craze, NP      . sodium chloride flush (NS) 0.9 % injection 3 mL  3 mL Intravenous Q12H Willia Craze, NP   3 mL at 12/24/15 2146  . traMADol (ULTRAM) tablet 50-100 mg  50-100 mg Oral Q6H PRN Velvet Bathe, MD   100 mg at 12/25/15 1230   Facility-Administered Medications Ordered in Other Encounters  Medication Dose Route Frequency Provider Last Rate Last Dose  . sodium chloride 0.9 % injection 10 mL  10 mL Intravenous PRN [name], MD   10 mL at 04/25/13 1100     Discharge Medications: Please see discharge summary for a list of discharge medications.  Relevant Imaging Results:  Relevant Lab Results:   Additional Information SS#: [id number]  Candie Chroman, LCSW

## 2015-12-25 NOTE — Clinical Social Work Note (Signed)
CSW presented bed offers. Patient gave verbal permission to contact his daughter although he says she is not speaking to him. Patient also provided his sister's phone number. Now on facesheet.  Dayton Scrape, [place]

## 2015-12-25 NOTE — Clinical Social Work Placement (Signed)
   CLINICAL SOCIAL WORK PLACEMENT  NOTE  Date:  12/25/2015  Patient Details  Name: [name] [name] MRN: [id number] Date of Birth: 01-03-1946  Clinical Social Work is seeking post-discharge placement for this patient at the [place] level of care (*CSW will initial, date and re-position this form in  chart as items are completed):  Yes   Patient/family provided with [place] Work Department's list of facilities offering this level of care within the geographic area requested by the patient (or if unable, by the patient's family).  Yes   Patient/family informed of their freedom to choose among providers that offer the needed level of care, that participate in Medicare, Medicaid or managed care program needed by the patient, have an available bed and are willing to accept the patient.  Yes   Patient/family informed of [facility]'s ownership interest in [facility] and [facility], as well as of the fact that they are under no obligation to receive care at these facilities.  PASRR submitted to EDS on 12/25/15     PASRR number received on 12/25/15     Existing PASRR number confirmed on       FL2 transmitted to all facilities in geographic area requested by pt/family on 12/25/15     FL2 transmitted to all facilities within larger geographic area on       Patient informed that his/her managed care company has contracts with or will negotiate with certain facilities, including the following:            Patient/family informed of bed offers received.  Patient chooses bed at       Physician recommends and patient chooses bed at      Patient to be transferred to   on  .  Patient to be transferred to facility by       Patient family notified on   of transfer.  Name of family member notified:        PHYSICIAN       Additional Comment:    _______________________________________________ Candie Chroman, LCSW 12/25/2015, 2:50 PM

## 2015-12-25 NOTE — Care Management Obs Status (Signed)
[place] NOTIFICATION   Patient Details  Name: [name] [name] MRN: [id number] Date of Birth: 08-14-1945   Medicare Observation Status Notification Given:  Yes  Patient unable to sign form- signature marked with and "X"; pt rt hand is swollen and tender to touch; Obs letter explained to the pt in detail, all questions answered. BLC  Royston Bake, RN 12/25/2015, 11:16 AM

## 2015-12-25 NOTE — Care Management Note (Signed)
Case Management Note  Patient Details  Name: [name] [name] MRN: [id number] Date of Birth: Apr 28, 1946  Subjective/Objective:     Admitted with weakness, Rhabdo               Action/Plan: Patient stated that he fell and was unable to get up for sever hours. Patient stated " Why can't someone stay with me?" CM talked to patient about SNF, he is in agreement to go; Soc Worker referral made for consultation;  Expected Discharge Date:  12/26/15               Expected Discharge Plan:  [place]  In-House Referral:  Clinical Social Work  Discharge planning Services  CM Consult  Choice offered to:  Patient  Status of Service:  In process, will continue to follow  Sherrilyn Rist [id number] 12/25/2015, 11:19 AM

## 2015-12-25 NOTE — Evaluation (Signed)
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 Pt's PMH includes ischemic cardiomyopathy, GI bleed, CAD, EtOH use, lymphoma, on home O2.    Clinical Impression   Pt admitted with above. He demonstrates the below listed deficits and will benefit from continued OT to maximize safety and independence with BADLs.  Pt presents to OT with decreased AROM bil. UEs, pain Rt UE and back, generalized weakness, decreased activity tolerance.  Overall, he requires max A for ADLs, and mod A for functional transfers.  Recommend SNF level rehab at discharge.  Pt reports he is concerned about cost and states he goes to [place] for care - encouraged him to discuss with SW and/or CM      Follow Up Recommendations  SNF;Supervision/Assistance - 24 hour    Equipment Recommendations  None recommended by OT    Recommendations for Other Services       Precautions / Restrictions Precautions Precautions: Fall      Mobility Bed Mobility Overal bed mobility: Needs Assistance Bed Mobility: Supine to Sit;Sit to Supine     Supine to sit: Max assist Sit to supine: Mod assist   General bed mobility comments: Pt requires assist to lift trunk and to move UEs off EOB.  Pt refuses to roll   Transfers Overall transfer level: Needs assistance Equipment used: Rolling walker (2 wheeled) Transfers: Sit to/from [facility] Sit to Stand: Mod assist Stand pivot transfers: Min assist       General transfer comment: Pt reliant on momentum to stand and requires assist to power up and for balance     Balance Overall balance assessment: Needs assistance Sitting-balance support: Feet supported Sitting balance-Leahy Scale: Fair     Standing balance support: Bilateral upper extremity supported Standing balance-Leahy Scale: Poor                              ADL Overall ADL's : Needs assistance/impaired Eating/Feeding: Set up;Bed level Eating/Feeding Details (indicate cue type and reason): feeds self with Lt UE Grooming: Wash/dry hands;Wash/dry face;Set up;Bed level   Upper Body Bathing: Maximal assistance;Bed level   Lower Body Bathing: Sit to/from stand;Maximal assistance   Upper Body Dressing : Maximal assistance;Sitting   Lower Body Dressing: Total assistance;Sit to/from stand   Toilet Transfer: Moderate assistance;Stand-pivot;BSC;RW   Toileting- Clothing Manipulation and Hygiene: Total assistance;Sit to/from stand       Functional mobility during ADLs: Moderate assistance;Rolling walker General ADL Comments: Pt is very slow to initiate movement due to reported pain      Vision     Perception     Praxis      Pertinent Vitals/Pain Pain Assessment: Faces Faces Pain Scale: Hurts even more Pain Location: back, Rt hand  Pain Descriptors / Indicators: Grimacing;Guarding;Spasm Pain Intervention(s): Limited activity within patient's tolerance;Repositioned     Hand Dominance Right   Extremity/Trunk Assessment Upper Extremity Assessment Upper Extremity Assessment: RUE deficits/detail;LUE deficits/detail RUE Deficits / Details: Pt reports h/o shoulder issues and limited ROM.  AAROM shoulder to 90, elbow flex/ext AAROM WFL, Wrist flex/ext AROM.  Fingers ~75% AAROM.  Pain in joints and edema noted  RUE Coordination: decreased fine motor;decreased gross motor LUE Deficits /  Details: Pt reports h/o rotator cuff tear with limited shoulder ROM.  AAROM to 90*.  Elbow distally AROM WFL  LUE Coordination: decreased gross motor   Lower Extremity Assessment Lower Extremity Assessment: Defer to PT evaluation   Cervical / Trunk Assessment Cervical / Trunk Assessment: Other exceptions Cervical  / Trunk Exceptions: Pt with limited mobilty in trunk.  Moves rigidly due to spasms    Communication Communication Communication: No difficulties   Cognition Arousal/Alertness: Awake/alert Behavior During Therapy: Flat affect Overall Cognitive Status: Within Functional Limits for tasks assessed                     General Comments       Exercises Exercises: Other exercises Other Exercises Other Exercises: Pt performed 10 reps AAROM bil. shoulder, elbows, wrists, hands    Shoulder Instructions      Home Living Family/patient expects to be discharged to:: Skilled nursing facility Living Arrangements: Alone Available Help at Discharge: Family;Available PRN/intermittently Type of Home: House Home Access: Stairs to enter [employer] of Steps: 1 (small step up) Entrance Stairs-Rails: None Home Layout: One level     Bathroom Shower/Tub: Tub/shower unit;Curtain   Bathroom Toilet: Handicapped height     Home Equipment: Cane - single point          Prior Functioning/Environment Level of Independence: Independent        Comments: Retired, Water engineer.  Says he has been having back spasms 1x/month for several months, each episode lasting ~4 hrs.      OT Diagnosis: Generalized weakness;Acute pain   OT Problem List: Decreased strength;Decreased range of motion;Decreased activity tolerance;Impaired balance (sitting and/or standing);Decreased coordination;Decreased safety awareness;Decreased knowledge of use of DME or AE;Decreased knowledge of precautions;Obesity;Impaired UE functional use;Pain   OT Treatment/Interventions: Self-care/ADL training;Therapeutic exercise;DME and/or AE instruction;Therapeutic activities;Patient/family education;Balance training    OT Goals(Current goals can be found in the care plan section) Acute Rehab OT Goals Patient Stated Goal: to get better  OT Goal Formulation: With patient Time For Goal Achievement: 01/08/16 Potential to  Achieve Goals: Good ADL Goals Pt Will Perform Upper Body Bathing: with min assist;sitting Pt Will Perform Lower Body Bathing: with mod assist;sit to/from stand Pt Will Perform Upper Body Dressing: with min assist;sitting Pt Will Perform Lower Body Dressing: with mod assist;with adaptive equipment;sit to/from stand Pt Will Transfer to Toilet: with min assist;stand pivot transfer;bedside commode Pt Will Perform Toileting - Clothing Manipulation and hygiene: with mod assist;sit to/from stand  OT Frequency: Min 2X/week   Barriers to D/C: Decreased caregiver support          Co-evaluation              End of Session Equipment Utilized During Treatment: Gait belt;Rolling walker Nurse Communication: Mobility status  Activity Tolerance: No increased pain;Patient limited by fatigue Patient left: in bed;with call bell/phone within reach;with bed alarm set   Time: [id number] OT Time Calculation (min): 32 min Charges:  OT General Charges $OT Visit: 1 Procedure OT Evaluation $OT Eval Moderate Complexity: 1 Procedure OT Treatments $Therapeutic Activity: 8-22 mins G-Codes: OT G-codes **NOT FOR INPATIENT CLASS** Functional Limitation: Self care Self Care Current Status [id number]): At least 40 percent but less than 60 percent impaired, limited or restricted Self Care Goal Status [id number]): At least 20 percent but less than 40 percent impaired, limited or restricted  Jatavis Malek M 12/25/2015, 6:57 PM

## 2015-12-25 NOTE — Consult Note (Signed)
   [facility] CM Inpatient Consult   12/25/2015  [name] [name] 1946/03/15 [id number]  Patient was screened for [facility] services for multiple ED visits in the past 6 months.  Patient is a 70 y.o. 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    Met with the patient regarding the benefits of [place] [facility] services.  Explained that [place] [facility] is a covered benefit of [employer].  Patient continues to endorse that his primary care provider is at the [place].  Review information for [facility] and a brochure was provided with contact information.  Explained that [facility] does not interfere with or replace any services arranged by the inpatient care management staff. Patient states he remembers receiving a call and declined the services. Patient currently concerned that he is in need of rehabilitation and cannot return home.  States, "I can't return home like this, I need to see if the [place] has rehab also.  [facility]."  Patient states, "I will accept your brochure and just put it on the table, I am having too many back spasms and pain right now."  No further issues addressed at this time.  For questions, please contact:  Natividad Brood, RN BSN [facility] Liaison  [phone] business mobile phone Toll free office [phone]

## 2015-12-25 NOTE — Clinical Social Work Note (Signed)
Clinical Social Work Assessment  Patient Details  Name: [name] [name] MRN: [id number] Date of Birth: Jun 01, 1946  Date of referral:  12/25/15               Reason for consult:  Facility Placement, Discharge Planning                Permission sought to share information with:  Facility Sport and exercise psychologist, Family Supports Permission granted to share information::  Yes, Verbal Permission Granted  Name::     [name] [name]  Agency::  SNF's  Relationship::  Daughter  Contact Information:  [phone]  Housing/Transportation Living arrangements for the past 2 months:  Single Family Home Source of Information:  Patient, Medical Team Patient Interpreter Needed:  None Criminal Activity/Legal Involvement Pertinent to Current Situation/Hospitalization:  No - Comment as needed Significant Relationships:  Adult Children Lives with:  Self Do you feel safe going back to the place where you live?  Yes Need for family participation in patient care:  Yes (Comment)  Care giving concerns:  PT recommending SNF once medically stable for discharge.   Social Worker assessment / plan:  CSW met with patient. No supports at bedside. CSW introduced role and explained that discharge planning would be discussed. Confirmed SNF recommendation. Patient does not feel that he can go home right now. Patient agreeable to SNF placement. CSW provided SNF list. Patient gave verbal permission to fax to facilities in the area. No further concerns. CSW encouraged patient to contact CSW as needed. CSW will continue to follow patient and facilitate discharge to SNF once medically stable.  Employment status:  Retired Nurse, adult PT Recommendations:  [place] / Referral to community resources:  [place]  Patient/Family's Response to care:  Patient agreeable to SNF placement. Patient's daughter is reportedly supportive and involved in patient's  care. Patient polite and appreciated social work intervention.  Patient/Family's Understanding of and Emotional Response to Diagnosis, Current Treatment, and Prognosis:  Patient knowledgeable of medical interventions and aware of discharge to SNF once medically stable.  Emotional Assessment Appearance:  Appears stated age Attitude/Demeanor/Rapport:   (Pleasant) Affect (typically observed):  Accepting, Appropriate, Calm, Pleasant Orientation:  Oriented to Self, Oriented to Place, Oriented to  Time, Oriented to Situation Alcohol / Substance use:  Tobacco Use Psych involvement (Current and /or in the community):  No (Comment)  Discharge Needs  Concerns to be addressed:  Care Coordination Readmission within the last 30 days:  No Current discharge risk:  Dependent with Mobility, Lives alone Barriers to Discharge:  [place], LCSW 12/25/2015, 2:48 PM

## 2015-12-26 DIAGNOSIS — L03113 Cellulitis of right upper limb: Secondary | ICD-10-CM | POA: Diagnosis not present

## 2015-12-26 LAB — GLUCOSE, CAPILLARY
GLUCOSE-CAPILLARY: 211 mg/dL — AB (ref 65–99)
GLUCOSE-CAPILLARY: 326 mg/dL — AB (ref 65–99)
GLUCOSE-CAPILLARY: 284 mg/dL — AB (ref 65–99)
GLUCOSE-CAPILLARY: 220 mg/dL — AB (ref 65–99)

## 2015-12-26 LAB — CK: CK TOTAL: 392 U/L (ref 49–397)

## 2015-12-26 MED ORDER — ENOXAPARIN SODIUM 60 MG/0.6ML ~~LOC~~ SOLN
60.0000 mg | SUBCUTANEOUS | Status: DC
  Administered 2015-12-26 – 2015-12-27 (×2): 60 mg via SUBCUTANEOUS
  Filled 2015-12-26 (×2): qty 0.6

## 2015-12-26 MED ORDER — METHOCARBAMOL 500 MG PO TABS
500.0000 mg | ORAL_TABLET | Freq: Four times a day (QID) | ORAL | Status: DC | PRN
  Administered 2015-12-26 (×2): 500 mg via ORAL
  Filled 2015-12-26 (×2): qty 1

## 2015-12-26 NOTE — Progress Notes (Signed)
Cm met with pt in room to explain and deliver ABN; pt declines to choose and option or sign; CM has made not and delivered without signature.  Cm requested permission to speak with his daughter, Mackie Pai [phone] who driving in from [place] this evening to advocate for her father, the pt.  CM explained to [place] what and ABN is and Mackie Pai explained she works in [employer] and is well acquainted with an [place].  CM has left copy of the ABN in room for pt and [place].

## 2015-12-26 NOTE — Progress Notes (Addendum)
PROGRESS NOTE    [name] [name]  [id number] DOB: May 10, 1946 DOA: 12/24/2015 PCP: Cathlean Cower, MD   Brief Narrative:  70 y.o. 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 No evidence at this time that patient's elevated troponin is due to cardiac ischemia   Assessment & Plan:   Active Problems:   CAD, CTO RCA with collat. S/P OM1 BMS 06/06/12 - Aspirin  Right hand cellulitis - continue pain control and antibiotic, improving    Chronic combined systolic and diastolic CHF (congestive heart failure) ([place]) - compensated currently    On home oxygen therapy   Obstructive sleep apnea - continue home medication regimen    Essential hypertension - continue b blocker, relatively well controlled    Weakness    AKI (acute kidney injury) ([place]) - improving    Rhabdomyolysis - trending down and ck has normalized.  Muscle spasms of lower back - Add Robaxin today.  DM no well controlled - Will place on Lantus and SSI - diabetic diet   DVT prophylaxis: lovenox Code Status: full Family Communication: None at bedside Disposition Plan: pending improvement in condition   Consultants:   None   Procedures: None  Antimicrobials: Keflex  Subjective: No new complaints. His right hand still hurts at the dorsum  Objective: Filed Vitals:   12/25/15 2005 12/26/15 0340 12/26/15 1042 12/26/15 1230  BP: 112/60 134/75 151/72 139/65  Pulse: 84 81 92 96  Temp: 97.5 F (36.4 C) 98.2 F (36.8 C)  98.9 F (37.2 C)  TempSrc: Oral Oral  Oral  Resp: 18 18  18   Height:      Weight:  129.23 kg (284 lb 14.4 oz)    SpO2: 98% 99%  100%    Intake/Output Summary (Last 24 hours) at  12/26/15 1515 Last data filed at 12/26/15 1400  Gross per 24 hour  Intake 2921.33 ml  Output   2200 ml  Net 721.33 ml   Filed Weights   12/24/15 1608 12/25/15 0441 12/26/15 0340  Weight: 126.191 kg (278 lb 3.2 oz) 127.098 kg (280 lb 3.2 oz) 129.23 kg (284 lb 14.4 oz)    Examination:  General exam: Appears calm and comfortable  Respiratory system: Clear to auscultation. Respiratory effort normal. Cardiovascular system: S1 & S2 heard, RRR.  Gastrointestinal system: Abdomen is nondistended, soft and nontender. No organomegaly or masses felt. Normal bowel sounds heard. Central nervous system: Alert and oriented.equal tone BL Extremities: Symmetric 5 x 5 power. Skin: right wrist cellulitis and hand cellulitis over dorsum of hand no pain on palpation over palm, no errythema over palm Psychiatry:  Mood & affect appropriate.     Data Reviewed: I have personally reviewed following labs and imaging studies  CBC:  Recent Labs Lab 12/24/15 1145 12/25/15 0432  WBC 13.3* 11.2*  NEUTROABS 9.7*  --   HGB 13.8 12.4*  HCT 41.9 37.3*  MCV 87.3 86.3  PLT 187 [id number]   Basic Metabolic Panel:  Recent Labs Lab 12/24/15 1145 12/24/15 1756 12/25/15 0432  NA 135  --  135  K 3.1*  --  3.6  CL 92*  --  95*  CO2 23  --  25  GLUCOSE 224*  --  258*  BUN 74*  --  79*  CREATININE 2.51*  --  2.40*  CALCIUM 9.6  --  9.2  MG  --  2.3  --    GFR: Estimated Creatinine Clearance: 41.5 mL/min (by C-G formula based on Cr of 2.4). Liver Function Tests:  Recent Labs Lab 12/24/15 1145  AST 32  ALT 17  ALKPHOS 74  BILITOT 1.7*  PROT 8.1  ALBUMIN 3.7   No results for input(s): LIPASE, AMYLASE in the last 168 hours. No results for input(s): AMMONIA in the last 168 hours. Coagulation Profile: No results for input(s): INR, PROTIME in the last 168 hours. Cardiac Enzymes:  Recent Labs Lab 12/24/15 1559 12/24/15 1756 12/24/15 2240 12/25/15 0432 12/25/15 1100 12/26/15 0220  CKTOTAL  --   965* 874* 733* 647* 392  TROPONINI 0.13*  --  0.09*  --   --   --    BNP (last 3 results) No results for input(s): PROBNP in the last 8760 hours. HbA1C: No results for input(s): HGBA1C in the last 72 hours. CBG:  Recent Labs Lab 12/25/15 1123 12/25/15 1545 12/25/15 2049 12/26/15 0542 12/26/15 1213  GLUCAP 361* 304* 257* 211* 326*   Lipid Profile: No results for input(s): CHOL, HDL, LDLCALC, TRIG, CHOLHDL, LDLDIRECT in the last 72 hours. Thyroid Function Tests: No results for input(s): TSH, T4TOTAL, FREET4, T3FREE, THYROIDAB in the last 72 hours. Anemia Panel: No results for input(s): VITAMINB12, FOLATE, FERRITIN, TIBC, IRON, RETICCTPCT in the last 72 hours. Sepsis Labs:  Recent Labs Lab 12/24/15 1203 12/24/15 1912 12/24/15 2240  LATICACIDVEN 3.00* 1.4 1.7    Recent Results (from the past 240 hour(s))  Urine culture     Status: Abnormal   Collection Time: 12/24/15  2:31 PM  Result Value Ref Range Status   Specimen Description URINE, CLEAN CATCH  Final   Special Requests NONE  Final   Culture 4,000 COLONIES/mL INSIGNIFICANT GROWTH (A)  Final   Report Status 12/25/2015 FINAL  Final         Radiology Studies: No results found.      Scheduled Meds: . aspirin EC  81 mg Oral Daily  . carvedilol  12.5 mg Oral BID  . cephALEXin  500 mg Oral Q12H  . enoxaparin (LOVENOX) injection  60 mg Subcutaneous Q24H  . insulin aspart  0-15 Units Subcutaneous TID WC  . insulin glargine  30 Units Subcutaneous QHS  . sodium chloride flush  3 mL Intravenous Q12H   Continuous Infusions:     LOS: 2 days    Time spent: > 35 minutes  Velvet Bathe, MD [facility] Pager [phone]  If 7PM-7AM, please contact night-coverage [email or web address] Password [facility] 12/26/2015, 3:15 PM   Addendum:  It is in my opinion that patient is not safe to be discharged home given generalized weakness and he will need placement into SNF. Consult to social worker has been placed this  AM for SNF placement.

## 2015-12-26 NOTE — Progress Notes (Signed)
Patient continues to refuse CPAP 

## 2015-12-26 NOTE — Clinical Social Work Note (Signed)
CSW spoke to patient and his daughter to discuss bed offers, patient stated he would like [place], [facility], and then [facility].  CSW contacted patient's daughter and she agreed to patient's choices.  CSW contacted insurance company and received authorization number of [id number].  CSW spoke to [facility] and they can take patient on Sunday if he is medically ready for discharge and orders have been received, CSW notified patient.  CSW spoke to patient with case manager and explained to patient about being on observation verse inpatient status.  Patient's daughter was also notified by CSW and case manager to discuss observation status.  CSW informed physician that patient will have bed available on Sunday CSW to continue to follow patient's progress and facilitate discharge planning to SNF once patient is ready for discharge.  [name] [name], MSW, [place] 12/26/2015 6:34 PM

## 2015-12-27 DIAGNOSIS — I10 Essential (primary) hypertension: Secondary | ICD-10-CM | POA: Diagnosis not present

## 2015-12-27 DIAGNOSIS — L03113 Cellulitis of right upper limb: Secondary | ICD-10-CM | POA: Diagnosis not present

## 2015-12-27 LAB — GLUCOSE, CAPILLARY
GLUCOSE-CAPILLARY: 359 mg/dL — AB (ref 65–99)
Glucose-Capillary: 178 mg/dL — ABNORMAL HIGH (ref 65–99)
Glucose-Capillary: 210 mg/dL — ABNORMAL HIGH (ref 65–99)
Glucose-Capillary: 238 mg/dL — ABNORMAL HIGH (ref 65–99)

## 2015-12-27 LAB — COMPREHENSIVE METABOLIC PANEL
ALT: 23 U/L (ref 17–63)
ANION GAP: 12 (ref 5–15)
AST: 29 U/L (ref 15–41)
Albumin: 2.6 g/dL — ABNORMAL LOW (ref 3.5–5.0)
Alkaline Phosphatase: 90 U/L (ref 38–126)
BUN: 66 mg/dL — ABNORMAL HIGH (ref 6–20)
CO2: 26 mmol/L (ref 22–32)
Calcium: 9.2 mg/dL (ref 8.9–10.3)
Chloride: 93 mmol/L — ABNORMAL LOW (ref 101–111)
Creatinine, Ser: 2.11 mg/dL — ABNORMAL HIGH (ref 0.61–1.24)
GFR, EST AFRICAN AMERICAN: 35 mL/min — AB (ref >60)
GFR, EST NON AFRICAN AMERICAN: 30 mL/min — AB (ref >60)
Glucose, Bld: 327 mg/dL — ABNORMAL HIGH (ref 65–99)
POTASSIUM: 4 mmol/L (ref 3.5–5.1)
Sodium: 131 mmol/L — ABNORMAL LOW (ref 135–145)
Total Bilirubin: 0.9 mg/dL (ref 0.3–1.2)
Total Protein: 7.4 g/dL (ref 6.5–8.1)

## 2015-12-27 LAB — FOLATE: FOLATE: 11.3 ng/mL (ref >5.9)

## 2015-12-27 LAB — VITAMIN B12: VITAMIN B 12: 282 pg/mL (ref 180–914)

## 2015-12-27 LAB — AMMONIA: AMMONIA: 26 umol/L (ref 9–35)

## 2015-12-27 LAB — CK: CK TOTAL: 233 U/L (ref 49–397)

## 2015-12-27 NOTE — Progress Notes (Signed)
Patient's daughter arrived from [place] this evening; verbalized concerns regarding the current state of her father's health and his readiness for discharge. Plans are for her to speak with physician tomorrow morning during rounds to discuss further.

## 2015-12-27 NOTE — Progress Notes (Signed)
PROGRESS NOTE    [name] [name]  [id number] DOB: 1945/12/03 DOA: 12/24/2015 PCP: Cathlean Cower, MD   Brief Narrative:  70 y.o. male with a Past Medical History of ischemic cardiomyopathy, hypertension, hyperlipidemia, GI bleed, CHF/systolic, GERD, CAD, EtOH use, aortic stenosis, pancreatitis, lymphoma, OSA on CPAP, DM, PUD who presents with hand cellulitis, rhabdomyolysis secondary to fall and long lie syndrome with superimposed AKI in the setting of compensated CHF. No evidence at this time that patient's elevated troponin is due to cardiac ischemia   Assessment & Plan: Metabolic encephalopathy - New problem most likely due to recent administration of low dose Robaxin. Will discontinue Robaxin. Obtain routine lab work.    Active Problems:   CAD, CTO RCA with collat. S/P OM1 BMS 06/06/12 - Aspirin  Right hand cellulitis - continue pain control and antibiotic    Chronic combined systolic and diastolic CHF (congestive heart failure) ([place])   On home oxygen therapy   Obstructive sleep apnea - continue home medication regimen    Essential hypertension - continue b blocker, relatively well controlled    Weakness    AKI (acute kidney injury) ([place]) - improving    Rhabdomyolysis - trending down. Was secondary to prolonged time being down.    Cellulitis of right hand - Keflex  DM no well controlled - Will place on Lantus and SSI - diabetic diet   DVT prophylaxis: lovenox Code Status: full Family Communication: None at bedside Disposition Plan: pending improvement in condition   Consultants:   None   Procedures: None  Antimicrobials: Keflex  Subjective: Daughter states patient is more confused today  Objective: Filed Vitals:   12/26/15 2047 12/27/15 0334 12/27/15 0942 12/27/15 1036  BP: 149/71 127/72 136/73   Pulse: 95 79 80   Temp: 98.3 F (36.8 C) 99 F (37.2 C)    TempSrc: Oral Oral    Resp: 18 17    Height:      Weight:  130.545 kg (287 lb 12.8  oz)    SpO2: 98% 99%  97%    Intake/Output Summary (Last 24 hours) at 12/27/15 1050 Last data filed at 12/27/15 0946  Gross per 24 hour  Intake    423 ml  Output   1750 ml  Net  -1327 ml   Filed Weights   12/25/15 0441 12/26/15 0340 12/27/15 0334  Weight: 127.098 kg (280 lb 3.2 oz) 129.23 kg (284 lb 14.4 oz) 130.545 kg (287 lb 12.8 oz)    Examination:  General exam: Appears calm and comfortable, pleasantly confused Respiratory system: Clear to auscultation. Respiratory effort normal. Cardiovascular system: S1 & S2 heard, RRR.  Gastrointestinal system: Abdomen is nondistended, soft and nontender. No organomegaly or masses felt. Normal bowel sounds heard. Central nervous system: Alert and oriented.equal tone BL Extremities: Symmetric 5 x 5 power. Skin: right wrist cellulitis and hand cellulitis over dorsum of hand no pain on palpation over palm, no errythema over palm Psychiatry:  Mood & affect appropriate.     Data Reviewed: I have personally reviewed following labs and imaging studies  CBC:  Recent Labs Lab 12/24/15 1145 12/25/15 0432  WBC 13.3* 11.2*  NEUTROABS 9.7*  --   HGB 13.8 12.4*  HCT 41.9 37.3*  MCV 87.3 86.3  PLT 187 [id number]   Basic Metabolic Panel:  Recent Labs Lab 12/24/15 1145 12/24/15 1756 12/25/15 0432  NA 135  --  135  K 3.1*  --  3.6  CL 92*  --  95*  CO2 23  --  25  GLUCOSE 224*  --  258*  BUN 74*  --  79*  CREATININE 2.51*  --  2.40*  CALCIUM 9.6  --  9.2  MG  --  2.3  --    GFR: Estimated Creatinine Clearance: 41.7 mL/min (by C-G formula based on Cr of 2.4). Liver Function Tests:  Recent Labs Lab 12/24/15 1145  AST 32  ALT 17  ALKPHOS 74  BILITOT 1.7*  PROT 8.1  ALBUMIN 3.7   No results for input(s): LIPASE, AMYLASE in the last 168 hours. No results for input(s): AMMONIA in the last 168 hours. Coagulation Profile: No results for input(s): INR, PROTIME in the last 168 hours. Cardiac Enzymes:  Recent Labs Lab  12/24/15 1559  12/24/15 2240 12/25/15 0432 12/25/15 1100 12/26/15 0220 12/27/15 0405  CKTOTAL  --   < > 874* 733* 647* 392 233  TROPONINI 0.13*  --  0.09*  --   --   --   --   < > = values in this interval not displayed. BNP (last 3 results) No results for input(s): PROBNP in the last 8760 hours. HbA1C: No results for input(s): HGBA1C in the last 72 hours. CBG:  Recent Labs Lab 12/26/15 0542 12/26/15 1213 12/26/15 1648 12/26/15 2023 12/27/15 0624  GLUCAP 211* 326* 284* 220* 210*   Lipid Profile: No results for input(s): CHOL, HDL, LDLCALC, TRIG, CHOLHDL, LDLDIRECT in the last 72 hours. Thyroid Function Tests: No results for input(s): TSH, T4TOTAL, FREET4, T3FREE, THYROIDAB in the last 72 hours. Anemia Panel: No results for input(s): VITAMINB12, FOLATE, FERRITIN, TIBC, IRON, RETICCTPCT in the last 72 hours. Sepsis Labs:  Recent Labs Lab 12/24/15 1203 12/24/15 1912 12/24/15 2240  LATICACIDVEN 3.00* 1.4 1.7    Recent Results (from the past 240 hour(s))  Urine culture     Status: Abnormal   Collection Time: 12/24/15  2:31 PM  Result Value Ref Range Status   Specimen Description URINE, CLEAN CATCH  Final   Special Requests NONE  Final   Culture 4,000 COLONIES/mL INSIGNIFICANT GROWTH (A)  Final   Report Status 12/25/2015 FINAL  Final         Radiology Studies: No results found.      Scheduled Meds: . aspirin EC  81 mg Oral Daily  . carvedilol  12.5 mg Oral BID  . cephALEXin  500 mg Oral Q12H  . enoxaparin (LOVENOX) injection  60 mg Subcutaneous Q24H  . insulin aspart  0-15 Units Subcutaneous TID WC  . insulin glargine  30 Units Subcutaneous QHS  . sodium chloride flush  3 mL Intravenous Q12H   Continuous Infusions:     LOS: 3 days    Time spent: > 35 minutes  Velvet Bathe, MD [facility] Pager [phone]  If 7PM-7AM, please contact night-coverage [email or web address] Password [email or web address] 12/27/2015, 10:50 AM

## 2015-12-28 DIAGNOSIS — I10 Essential (primary) hypertension: Secondary | ICD-10-CM | POA: Diagnosis not present

## 2015-12-28 DIAGNOSIS — G4733 Obstructive sleep apnea (adult) (pediatric): Secondary | ICD-10-CM

## 2015-12-28 LAB — GLUCOSE, CAPILLARY
Glucose-Capillary: 185 mg/dL — ABNORMAL HIGH (ref 65–99)
Glucose-Capillary: 227 mg/dL — ABNORMAL HIGH (ref 65–99)
Glucose-Capillary: 271 mg/dL — ABNORMAL HIGH (ref 65–99)

## 2015-12-28 LAB — CBC
HEMATOCRIT: 36.2 % — AB (ref 39.0–52.0)
HEMOGLOBIN: 11.5 g/dL — AB (ref 13.0–17.0)
MCH: 28 pg (ref 26.0–34.0)
MCHC: 31.8 g/dL (ref 30.0–36.0)
MCV: 88.3 fL (ref 78.0–100.0)
Platelets: 225 10*3/uL (ref 150–400)
RBC: 4.1 MIL/uL — ABNORMAL LOW (ref 4.22–5.81)
RDW: 14.6 % (ref 11.5–15.5)
WBC: 9.3 10*3/uL (ref 4.0–10.5)

## 2015-12-28 LAB — CK: CK TOTAL: 792 U/L — AB (ref 49–397)

## 2015-12-28 MED ORDER — CEPHALEXIN 500 MG PO CAPS: 500.0000 mg | ORAL_CAPSULE | Freq: Two times a day (BID) | ORAL | Status: DC

## 2015-12-28 MED ORDER — HYDROCODONE-ACETAMINOPHEN 5-325 MG PO TABS: 2.0000 | ORAL_TABLET | ORAL | Status: AC | PRN

## 2015-12-28 MED ORDER — CYCLOBENZAPRINE HCL 5 MG PO TABS: 5.0000 mg | ORAL_TABLET | Freq: Every day | ORAL | Status: AC

## 2015-12-28 NOTE — Progress Notes (Signed)
Orders received for pt discharge.  Discharge summary printed and reviewed with pt.  Explained medication regimen, and pt had no further questions at this time.  IV removed and site remains clean, dry, intact.  Telemetry removed.  Pt in stable condition and awaiting transport to [facility].

## 2015-12-28 NOTE — Clinical Social Work Note (Signed)
CSW facilitated patient discharge including contacting patient family and facility to confirm patient discharge plans. Clinical information faxed to facility and family agreeable with plan. CSW arranged ambulance transport via [facility] to [facility] RN to call report prior to discharge [phone] Room [address]  CSW will sign off for now as social work intervention is no longer needed. Please consult [place] again if new needs arise.  Dayton Scrape, [place]

## 2015-12-28 NOTE — Progress Notes (Signed)
Attempted to give report by calling Isaias Cowman at [phone], however, after being transferred x2 and being placed on hold for 3 min, I was unable to speak with anyone.  Therefore, report has not been given.

## 2015-12-28 NOTE — Discharge Summary (Signed)
Physician Discharge Summary  [name] [name] [id number] DOB: [birth year] DOA: 12/24/2015  PCP: Cathlean Cower, MD  Admit date: 12/24/2015 Discharge date: 12/28/2015  Time spent: > 35 minutes  Recommendations for Outpatient Follow-up:  1. Monitor wrist and decide whether or not to prolong antibiotic regimen  Discharge Diagnoses:  Active Problems:   CAD, CTO RCA with collat. S/P OM1 BMS 06/06/12   HTN (hypertension)   Chronic combined systolic and diastolic CHF (congestive heart failure) (HCC)   On home oxygen therapy   Obstructive sleep apnea   Essential hypertension   Weakness   AKI (acute kidney injury) ([place])   Rhabdomyolysis   Cellulitis of right hand   Discharge Condition: stable  Diet recommendation: heart healthy/carb modified.  Filed Weights   12/26/15 0340 12/27/15 0334 12/28/15 0337  Weight: 129.23 kg (284 lb 14.4 oz) 130.545 kg (287 lb 12.8 oz) 128.958 kg (284 lb 4.8 oz)    History of present illness:  70 y.o. male with a Past Medical History of ischemic cardiomyopathy, hypertension, hyperlipidemia, GI bleed, CHF/systolic, GERD, CAD, EtOH use, aortic stenosis, pancreatitis, lymphoma, OSA on CPAP, DM, PUD who presents with hand cellulitis, rhabdomyolysis secondary to fall and long lie syndrome with superimposed AKI in the setting of compensated CHF. No evidence at this time that patient's elevated troponin is due to cardiac ischemia.  Hospital Course:  Metabolic encephalopathy - New problem most likely due to recent administration of low dose Robaxin. Will discontinue Robaxin.  - improved with cessation of Robaxin.   Active Problems:  CAD, CTO RCA with collat. S/P OM1 BMS 06/06/12 - Aspirin  Right hand cellulitis - continue pain control and antibiotic on d/c. Will prescribe enough abx to complete a 7 day treatment course.    Chronic combined systolic and diastolic CHF (congestive heart failure) ([place])  On home oxygen therapy  Obstructive sleep apnea -  continue home medication regimen   Essential hypertension - continue b blocker, relatively well controlled   Weakness   AKI (acute kidney injury) ([place]) - improving   Rhabdomyolysis - trending down. Was secondary to prolonged time being down.   Cellulitis of right hand - Keflex  DM no well controlled - Will continue prior to admission medication regimen. - diabetic diet  Procedures:  None  Consultations:  None  Discharge Exam: Filed Vitals:   12/28/15 1020 12/28/15 1154  BP: 117/33 133/70  Pulse: 79 78  Temp:    Resp:  20    General: Pt in nad, alert and awake Cardiovascular: rrr, no rubs Respiratory: no increased wob, no wheezes  Discharge Instructions   Discharge Instructions    Call MD for:  difficulty breathing, headache or visual disturbances    Complete by:  As directed      Call MD for:  temperature >100.4    Complete by:  As directed      Diet - low sodium heart healthy    Complete by:  As directed      Discharge instructions    Complete by:  As directed   Please assess lower back muscle spasm and treat accordingly. Also decide whether or not to prolong antibiotic for treatment of right wrist infection     Increase activity slowly    Complete by:  As directed           Current Discharge Medication List    START taking these medications   Details  cephALEXin (KEFLEX) 500 MG capsule Take 1 capsule (500 mg total) by mouth  every 12 (twelve) hours. Qty: 6 capsule, Refills: 0      CONTINUE these medications which have CHANGED   Details  cyclobenzaprine (FLEXERIL) 5 MG tablet Take 1 tablet (5 mg total) by mouth at bedtime. Qty: 15 tablet, Refills: 0    HYDROcodone-acetaminophen (NORCO/VICODIN) 5-325 MG tablet Take 2 tablets by mouth every 4 (four) hours as needed. Qty: 30 tablet, Refills: 0      CONTINUE these medications which have NOT CHANGED   Details  albuterol (PROVENTIL HFA;VENTOLIN HFA) 108 (90 BASE) MCG/ACT inhaler Inhale 2  puffs into the lungs every 6 (six) hours as needed for wheezing or shortness of breath. Qty: 1 Inhaler, Refills: 11    !! AMBULATORY NON FORMULARY MEDICATION Home O2 @@ 3 LMP for 8 hrs at night    !! AMBULATORY NON FORMULARY MEDICATION CPAP    aspirin EC 81 MG tablet Take 81 mg by mouth daily.    atorvastatin (LIPITOR) 20 MG tablet Take 1 tablet (20 mg total) by mouth daily at 6 PM. Qty: 90 tablet, Refills: 3   Associated Diagnoses: Hyperlipidemia    Blood Glucose Monitoring Suppl (ACCU-CHEK AVIVA PLUS) W/DEVICE KIT Use to check blood sugars twice a day Dx 250.00 Qty: 1 kit, Refills: 0    carvedilol (COREG) 12.5 MG tablet Take 1 tablet (12.5 mg total) by mouth 2 (two) times daily. Qty: 180 tablet, Refills: 3   Associated Diagnoses: Chronic combined systolic and diastolic CHF (congestive heart failure) (HCC); SOB (shortness of breath)    glucose blood (ACCU-CHEK AVIVA PLUS) test strip Use to check blood sugars five times a day Dx E11.9 Qty: 150 each, Refills: 3    insulin aspart protamine - aspart (NOVOLOG MIX 70/30 FLEXPEN) (70-30) 100 UNIT/ML FlexPen Inject 40 Units into the skin 2 (two) times daily.    Insulin Pen Needle (BD PEN NEEDLE NANO U/F) 32G X 4 MM MISC Use as directed 1 per day   250.02 Qty: 100 each, Refills: 11    Insulin Glargine (TOUJEO SOLOSTAR) 300 UNIT/ML SOPN Inject 40 Units into the skin at bedtime. Qty: 4.5 mL, Refills: 3     !! - Potential duplicate medications found. Please discuss with provider.    STOP taking these medications     acetaminophen-codeine (TYLENOL #3) 300-30 MG tablet      metolazone (ZAROXOLYN) 5 MG tablet      torsemide (DEMADEX) 20 MG tablet      triamcinolone cream (KENALOG) 0.1 %      insulin lispro (HUMALOG KWIKPEN) 100 UNIT/ML KiwkPen      lisinopril (PRINIVIL,ZESTRIL) 10 MG tablet      predniSONE (DELTASONE) 50 MG tablet        Allergies  Allergen Reactions  . Other Hives    Poison ivy.    Follow-up Information     Follow up with [facility] .   Specialty:  Skilled Nursing Facility   Contact information:   [place] [phone]       The results of significant diagnostics from this hospitalization (including imaging, microbiology, ancillary and laboratory) are listed below for reference.    Significant Diagnostic Studies: Dg Chest Port 1 View  12/24/2015  CLINICAL DATA:  Weakness for 2 days.  Fall 1 day prior EXAM: PORTABLE CHEST 1 VIEW COMPARISON:  July 28, 2015 FINDINGS: There is no edema or consolidation. The heart size and pulmonary vascularity are normal. No pneumothorax. No adenopathy. There is extensive arthropathy in both shoulders.  No acute fracture evident. There is calcification in the left carotid artery. IMPRESSION: No edema or consolidation. No pneumothorax. Focal area of carotid artery calcification. Arthropathy in both shoulders. Electronically Signed   By: Lowella Grip III M.D.   On: 12/24/2015 13:56    Microbiology: Recent Results (from the past 240 hour(s))  Urine culture     Status: Abnormal   Collection Time: 12/24/15  2:31 PM  Result Value Ref Range Status   Specimen Description URINE, CLEAN CATCH  Final   Special Requests NONE  Final   Culture 4,000 COLONIES/mL INSIGNIFICANT GROWTH (A)  Final   Report Status 12/25/2015 FINAL  Final     Labs: Basic Metabolic Panel:  Recent Labs Lab 12/24/15 1145 12/24/15 1756 12/25/15 0432 12/27/15 1139  NA 135  --  135 131*  K 3.1*  --  3.6 4.0  CL 92*  --  95* 93*  CO2 23  --  25 26  GLUCOSE 224*  --  258* 327*  BUN 74*  --  79* 66*  CREATININE 2.51*  --  2.40* 2.11*  CALCIUM 9.6  --  9.2 9.2  MG  --  2.3  --   --    Liver Function Tests:  Recent Labs Lab 12/24/15 1145 12/27/15 1139  AST 32 29  ALT 17 23  ALKPHOS 74 90  BILITOT 1.7* 0.9  PROT 8.1 7.4  ALBUMIN 3.7 2.6*   No results for input(s): LIPASE, AMYLASE in the last 168 hours.  Recent Labs Lab  12/27/15 1139  AMMONIA 26   CBC:  Recent Labs Lab 12/24/15 1145 12/25/15 0432 12/28/15 0516  WBC 13.3* 11.2* 9.3  NEUTROABS 9.7*  --   --   HGB 13.8 12.4* 11.5*  HCT 41.9 37.3* 36.2*  MCV 87.3 86.3 88.3  PLT 187 185 225   Cardiac Enzymes:  Recent Labs Lab 12/24/15 1559  12/24/15 2240 12/25/15 0432 12/25/15 1100 12/26/15 0220 12/27/15 0405 12/28/15 0516  CKTOTAL  --   < > 874* 733* 647* 392 233 792*  TROPONINI 0.13*  --  0.09*  --   --   --   --   --   < > = values in this interval not displayed. BNP: BNP (last 3 results)  Recent Labs  12/24/15 1145  BNP 296.7*    ProBNP (last 3 results) No results for input(s): PROBNP in the last 8760 hours.  CBG:  Recent Labs Lab 12/27/15 1141 12/27/15 1638 12/27/15 2336 12/28/15 0540 12/28/15 1146  GLUCAP 359* 238* 178* 185* 227*    Signed:  Velvet Bathe MD.  [facility] 12/28/2015, 1:13 PM

## 2015-12-28 NOTE — Progress Notes (Signed)
Physical Therapy Treatment Patient Details Name: [name] [name] MRN: [id number] DOB: 11-Oct-1945 Today's Date: 12/28/2015    History of Present Illness Pt is a 70 y/o M who presents with Rt hand cellulitis, rhabdomyolysis secondary to fall and long lie syndrome with superimposed AK I in the setting of compensated CHF. Pt's PMH includes ischemic cardiomyopathy, GI bleed, CAD, EtOH use, lymphoma, on home O2.     PT Comments    Pt appears to be in more pain today w/ guarded posture, requiring increase in assist needed (mod +2 assist) for transfers. SNF recommendation remains appropriate.  Follow Up Recommendations  SNF;Supervision for mobility/OOB     Equipment Recommendations  Other (comment) (TBD at next venue of care)    Recommendations for Other Services       Precautions / Restrictions Precautions Precautions: Fall Restrictions Weight Bearing Restrictions: No    Mobility  Bed Mobility Overal bed mobility: Needs Assistance Bed Mobility: Supine to Sit     Supine to sit: HOB elevated;Min assist;+2 for physical assistance     General bed mobility comments: Pt again refuses to roll.  Assist elevating trunk. Increased time.  Transfers Overall transfer level: Needs assistance Equipment used: 1 person hand held assist Transfers: Sit to/from [facility] Sit to Stand: Mod assist;From elevated surface;+2 physical assistance Stand pivot transfers: Min assist;+2 physical assistance;+2 safety/equipment       General transfer comment: Mod +2 assist to boost to stand and to steady to pivot.  Cues for upright posture as pt flexed w/ knees slightly bent.    Ambulation/Gait         Gait velocity: deferred for pt/therapist safety       Stairs            Wheelchair Mobility    Modified Rankin (Stroke Patients Only)       Balance Overall balance assessment: Needs assistance Sitting-balance support: Single extremity supported;Feet  supported Sitting balance-Leahy Scale: Poor Sitting balance - Comments: Relies on UE support sitting EOB   Standing balance support: Single extremity supported;During functional activity Standing balance-Leahy Scale: Poor                      Cognition Arousal/Alertness: Awake/alert Behavior During Therapy: Anxious Overall Cognitive Status: Within Functional Limits for tasks assessed                      Exercises General Exercises - Lower Extremity Ankle Circles/Pumps: AROM;Both;10 reps;Seated    General Comments General comments (skin integrity, edema, etc.): Pt appears to be in more pain today w/ guarded posture, requiring increase in assist needed for transfers.      Pertinent Vitals/Pain Pain Assessment: Faces Faces Pain Scale: Hurts whole lot Pain Location: generalized pain; back, Lt knee Pain Descriptors / Indicators: Grimacing;Guarding;Spasm Pain Intervention(s): Limited activity within patient's tolerance;Monitored during session;Repositioned    Home Living                      Prior Function            PT Goals (current goals can now be found in the care plan section) Acute Rehab PT Goals Patient Stated Goal: to feel better PT Goal Formulation: With patient Time For Goal Achievement: 01/08/16 Potential to Achieve Goals: Good Progress towards PT goals: Not progressing toward goals - comment (pain and fatigue limiting)    Frequency  Min 3X/week    PT Plan Current plan remains  appropriate    Co-evaluation             End of Session Equipment Utilized During Treatment: Gait belt;Oxygen Activity Tolerance: Patient limited by pain;Patient limited by fatigue Patient left: in chair;with call bell/phone within reach;with chair alarm set     Time: 1304-1330 PT Time Calculation (min) (ACUTE ONLY): 26 min  Charges:  $Therapeutic Activity: 23-37 mins                     G Codes:      Collie Siad PT, DPT  Pager:  [phone] Phone: [phone] 12/28/2015, 2:25 PM

## 2015-12-28 NOTE — Clinical Social Work Placement (Signed)
   CLINICAL SOCIAL WORK PLACEMENT  NOTE  Date:  12/28/2015  Patient Details  Name: [name] [name] MRN: [id number] Date of Birth: Jul 31, 1945  Clinical Social Work is seeking post-discharge placement for this patient at the [place] level of care (*CSW will initial, date and re-position this form in  chart as items are completed):  Yes   Patient/family provided with [place] Work Department's list of facilities offering this level of care within the geographic area requested by the patient (or if unable, by the patient's family).  Yes   Patient/family informed of their freedom to choose among providers that offer the needed level of care, that participate in Medicare, Medicaid or managed care program needed by the patient, have an available bed and are willing to accept the patient.  Yes   Patient/family informed of [facility]'s ownership interest in [facility] and [facility], as well as of the fact that they are under no obligation to receive care at these facilities.  PASRR submitted to EDS on 12/25/15     PASRR number received on 12/25/15     Existing PASRR number confirmed on       FL2 transmitted to all facilities in geographic area requested by pt/family on 12/25/15     FL2 transmitted to all facilities within larger geographic area on       Patient informed that his/her managed care company has contracts with or will negotiate with certain facilities, including the following:            Patient/family informed of bed offers received.  Patient chooses bed at [facility]     Physician recommends and patient chooses bed at      Patient to be transferred to [facility] on 12/28/15.  Patient to be transferred to facility by [facility]     Patient family notified on 12/28/15 of transfer.  Name of family member notified:  [place]     PHYSICIAN       Additional Comment:     _______________________________________________ Candie Chroman, LCSW 12/28/2015, 3:40 PM

## 2015-12-28 NOTE — Clinical Social Work Note (Addendum)
CSW spoke with patient's daughter. She accepted bed offer at [facility]. Patient states that his daughter is making care decisions at this time. Will notify facility. Patient will likely discharge today. Will page MD once bed is confirmed.  Dayton Scrape, [place]  [facility] is at capacity for today and unable to take patient. Hoyle Sauer from [facility] will meet with patient's daughter at 1:45 pm today to do paperwork for [facility].  Dayton Scrape, [place]

## 2015-12-31 ENCOUNTER — Encounter: Payer: Self-pay | Admitting: Internal Medicine

## 2015-12-31 ENCOUNTER — Non-Acute Institutional Stay (SKILLED_NURSING_FACILITY): Payer: Commercial Managed Care - HMO | Admitting: Internal Medicine

## 2015-12-31 DIAGNOSIS — I25810 Atherosclerosis of coronary artery bypass graft(s) without angina pectoris: Secondary | ICD-10-CM | POA: Diagnosis not present

## 2015-12-31 DIAGNOSIS — M79641 Pain in right hand: Secondary | ICD-10-CM

## 2015-12-31 DIAGNOSIS — E1122 Type 2 diabetes mellitus with diabetic chronic kidney disease: Secondary | ICD-10-CM

## 2015-12-31 DIAGNOSIS — N183 Chronic kidney disease, stage 3 unspecified: Secondary | ICD-10-CM

## 2015-12-31 DIAGNOSIS — E785 Hyperlipidemia, unspecified: Secondary | ICD-10-CM

## 2015-12-31 DIAGNOSIS — R531 Weakness: Secondary | ICD-10-CM | POA: Diagnosis not present

## 2015-12-31 DIAGNOSIS — I10 Essential (primary) hypertension: Secondary | ICD-10-CM

## 2015-12-31 DIAGNOSIS — L03113 Cellulitis of right upper limb: Secondary | ICD-10-CM | POA: Diagnosis not present

## 2015-12-31 DIAGNOSIS — Z794 Long term (current) use of insulin: Secondary | ICD-10-CM

## 2015-12-31 DIAGNOSIS — D638 Anemia in other chronic diseases classified elsewhere: Secondary | ICD-10-CM | POA: Diagnosis not present

## 2015-12-31 DIAGNOSIS — J9610 Chronic respiratory failure, unspecified whether with hypoxia or hypercapnia: Secondary | ICD-10-CM | POA: Diagnosis not present

## 2015-12-31 DIAGNOSIS — E1165 Type 2 diabetes mellitus with hyperglycemia: Secondary | ICD-10-CM | POA: Diagnosis not present

## 2015-12-31 DIAGNOSIS — IMO0002 Reserved for concepts with insufficient information to code with codable children: Secondary | ICD-10-CM

## 2015-12-31 NOTE — Progress Notes (Signed)
Patient ID: [name] [name], male   DOB: 03-07-1946, 70 y.o.   MRN: [id number]     LOCATION: Facility: [facility]    PCP: Cathlean Cower, MD   Code Status: DNR   Goals of care: Advanced Directive information Advanced Directives 12/31/2015  Does patient have an advance directive? Yes  Type of Advance Directive Out of facility DNR (pink MOST or yellow form)  Does patient want to make changes to advanced directive? No - Patient declined  Copy of advanced directive(s) in chart? Yes       Extended Emergency Contact Information Primary Emergency Contact: [place] Mobile Phone: [phone] Relation: Daughter Secondary Emergency Contact: Clovis Fredrickson [place] Mobile Phone: [phone] Relation: Sister   Allergies  Allergen Reactions  . Other Hives    Poison ivy.     Chief Complaint  Patient presents with  . New Admit To SNF     HPI:  Patient is a 70 y.o. male seen today for short term rehabilitation post hospital admission from 12/24/15-12/28/15 with acute encephalopathy with right hand cellulitis and rhabdomyolysis with AKI.  He was started on antibiotics. His robaxin was discontinued. He made clinical improvement. He is seen in his room today. He has ischemic cardiomyopathy, HTN, HLD, CHF, GERD, OSA on o2, alcohol use, DM among others.   Review of Systems:  Constitutional: Negative for fever, chills, diaphoresis.  HENT: Negative for headache, congestion, nasal discharge, difficulty swallowing.   Eyes: Negative for blurred vision, double vision and discharge.  Respiratory: Negative for cough, shortness of breath and wheezing.   Cardiovascular: Negative for chest pain, palpitations, leg swelling.  Gastrointestinal: Negative for heartburn, nausea, vomiting, abdominal pain. Had bowel movement yesterday Genitourinary: Negative for dysuria and flank pain.  Musculoskeletal: Negative for fall. has  chronic back pain Skin: Negative for itching, rash.  Neurological: Negative for dizziness. Psychiatric/Behavioral: Negative for depression   Past Medical History  Diagnosis Date  . Ischemic cardiomyopathy   . Hypertension   . Hypercholesterolemia   . Lower GI bleeding 1980's    "when I was drinking alot" (06/06/2012)  . Chronic combined systolic and diastolic CHF (congestive heart failure) ([place]) 07/10/2012    a. EF 88-89%, + diastolic dysfunction by echo 12/2012.  Marland Kitchen GERD (gastroesophageal reflux disease) 07/12/2012  . Coronary artery disease     a. Cath 05/2012 with CTO of RCA; s/p BMS to severe OM-1 stenosis.  Marland Kitchen History of alcoholism ([place])     "haven't had a drink in 23 yr" (09/12/2014)  . Moderate aortic stenosis     a. Mod by echo 12/2012.  Marland Kitchen Pancreatitis   . Diverticulosis   . Personal history of rectal adenoma 05/28/2013  . Dietary noncompliance   . Morbid obesity ([place])   . Lymphoma ([place])     a. 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  . Shortness of breath   . Myocardial infarction ([place]) 11/13  . Pneumonia 05/2012    "right after stent was put in"  . OSA on CPAP   . On home oxygen therapy     "3L; pretty much last 1 1/2 weeks" (09/12/2014)  . Type II diabetes mellitus ([place])   . History of stomach ulcers     "when I drank alot; not bothered w/them since I quit drinking"  . Arthritis     "both shoulders" (09/12/2014)   Past Surgical History  Procedure Laterality  Date  . Coronary angioplasty with stent placement  06/06/2012    "1"  . Lymph gland excision  07/13/2012    Procedure: CERVICAL LYMPH GLAND EXCISION;  Surgeon: Currie Paris, MD;  Location: [facility] OR;  Service: General;  Laterality: Right;  . Portacath placement  07/30/2012    Procedure: INSERTION PORT-A-CATH;  Surgeon: Currie Paris, MD;  Location: [facility];  Service: General;  Laterality: Right;  .  Colonoscopy N/A 05/28/2013    Procedure: COLONOSCOPY;  Surgeon: Iva Boop, MD;  Location: [facility];  Service: Endoscopy;  Laterality: N/A;  . Left and right heart catheterization with coronary angiogram N/A 06/06/2012    Procedure: LEFT AND RIGHT HEART CATHETERIZATION WITH CORONARY ANGIOGRAM;  Surgeon: Chrystie Nose, MD;  Location: [facility] CATH LAB;  Service: Cardiovascular;  Laterality: N/A;  . Percutaneous coronary stent intervention (pci-s)  06/06/2012    Procedure: PERCUTANEOUS CORONARY STENT INTERVENTION (PCI-S);  Surgeon: Lennette Bihari, MD;  Location: [facility] CATH LAB;  Service: Cardiovascular;;  . Cardiac catheterization  1//20/2013    40french atrerial sheath   Social History:   reports that he quit smoking about 2 years ago. His smoking use included Cigarettes. He has a 106 pack-year smoking history. He has never used smokeless tobacco. He reports that he does not drink alcohol or use illicit drugs.  Family History  Problem Relation Age of Onset  . Osteoarthritis Mother   . Osteoarthritis Father   . Colon cancer Daughter   . Diabetes Father     entire family  . Heart disease Father     Medications:   Medication List       This list is accurate as of: 12/31/15  2:16 PM.  Always use your most recent med list.               ACCU-CHEK AVIVA PLUS w/Device Kit  Use to check blood sugars twice a day Dx 250.00     albuterol 108 (90 Base) MCG/ACT inhaler  Commonly known as:  PROVENTIL HFA;VENTOLIN HFA  Inhale 2 puffs into the lungs every 6 (six) hours as needed for wheezing or shortness of breath.     AMBULATORY NON FORMULARY MEDICATION  3 liters via nasal canula continuous.     AMBULATORY NON FORMULARY MEDICATION  CPAP with O2 @ 3 Liters Home settings     aspirin EC 81 MG tablet  Take 81 mg by mouth daily.     atorvastatin 20 MG tablet  Commonly known as:  LIPITOR  Take 1 tablet (20 mg total) by mouth daily at 6 PM.     carvedilol 12.5 MG tablet  Commonly known  as:  COREG  Take 1 tablet (12.5 mg total) by mouth 2 (two) times daily.     cephALEXin 500 MG capsule  Commonly known as:  KEFLEX  Take 1 capsule (500 mg total) by mouth every 12 (twelve) hours.     cyclobenzaprine 5 MG tablet  Commonly known as:  FLEXERIL  Take 1 tablet (5 mg total) by mouth at bedtime.     glucose blood test strip  Commonly known as:  ACCU-CHEK AVIVA PLUS  Use to check blood sugars five times a day Dx E11.9     HYDROcodone-acetaminophen 5-325 MG tablet  Commonly known as:  NORCO/VICODIN  Take 2 tablets by mouth every 4 (four) hours as needed.     Insulin Glargine 300 UNIT/ML Sopn  Commonly known as:  TOUJEO SOLOSTAR  Inject 40  Units into the skin at bedtime.     Insulin Pen Needle 32G X 4 MM Misc  Commonly known as:  BD PEN NEEDLE NANO U/F  Use as directed 1 per day   250.02     NOVOLOG MIX 70/30 FLEXPEN (70-30) 100 UNIT/ML FlexPen  Generic drug:  insulin aspart protamine - aspart  Inject 40 Units into the skin 2 (two) times daily.        Immunizations: Immunization History  Administered Date(s) Administered  . Influenza Split 07/14/2012  . Influenza,inj,Quad PF,36+ Mos 04/19/2013, 04/09/2014  . Pneumococcal Conjugate-13 02/04/2014  . Pneumococcal Polysaccharide-23 02/10/2015     Physical Exam: Filed Vitals:   12/31/15 0842  BP: 137/56  Pulse: 93  Temp: 98 F (36.7 C)  TempSrc: Oral  Resp: 20  Height: '6\' 2"'$  (1.88 m)  Weight: 284 lb 8 oz (129.048 kg)  SpO2: 97%   Body mass index is 36.51 kg/(m^2).  General- elderly male, obese, in no acute distress Head- normocephalic, atraumatic Nose- no maxillary or frontal sinus tenderness, no nasal discharge Throat- moist mucus membrane  Eyes- PERRLA, EOMI, no pallor, no icterus, no discharge, normal conjunctiva, normal sclera Neck- no cervical lymphadenopathy Cardiovascular- normal s1,s2, no murmur, 1+ leg edema, good radial pulse Respiratory- bilateral clear to auscultation, no wheeze, no  rhonchi, no crackles, no use of accessory muscles Abdomen- bowel sounds present, soft, non tender Musculoskeletal- able to move all 4 extremities, generalized weakness, on wheelchair, has arthritis changes to his fingers, has raised nodule to his interphalengeal joints, unable to flex his fingers in right hand to make a fist Neurological- alert and oriented to person, place and time Skin- warm and dry, right hand swelling with warmth and tenderness present, mild erythema present, no skin breakdown Psychiatry- normal mood and affect    Labs reviewed: Basic Metabolic Panel:  Recent Labs  12/24/15 1145 12/24/15 1756 12/25/15 0432 12/27/15 1139  NA 135  --  135 131*  K 3.1*  --  3.6 4.0  CL 92*  --  95* 93*  CO2 23  --  25 26  GLUCOSE 224*  --  258* 327*  BUN 74*  --  79* 66*  CREATININE 2.51*  --  2.40* 2.11*  CALCIUM 9.6  --  9.2 9.2  MG  --  2.3  --   --    Liver Function Tests:  Recent Labs  02/05/15 0940 12/24/15 1145 12/27/15 1139  AST 14 32 29  ALT '17 17 23  '$ ALKPHOS 60 74 90  BILITOT 0.35 1.7* 0.9  PROT 7.6 8.1 7.4  ALBUMIN 4.3 3.7 2.6*   No results for input(s): LIPASE, AMYLASE in the last 8760 hours.  Recent Labs  12/27/15 1139  AMMONIA 26   CBC:  Recent Labs  02/05/15 0940 12/24/15 1145 12/25/15 0432 12/28/15 0516  WBC 8.4 13.3* 11.2* 9.3  NEUTROABS 5.2 9.7*  --   --   HGB 12.1* 13.8 12.4* 11.5*  HCT 36.5* 41.9 37.3* 36.2*  MCV 88.4 87.3 86.3 88.3  PLT 175 187 185 225   Cardiac Enzymes:  Recent Labs  12/24/15 1559  12/24/15 2240  12/26/15 0220 12/27/15 0405 12/28/15 0516  CKTOTAL  --   < > 874*  < > 392 233 792*  TROPONINI 0.13*  --  0.09*  --   --   --   --   < > = values in this interval not displayed. BNP: Invalid input(s): POCBNP CBG:  Recent Labs  12/28/15  0540 12/28/15 1146 12/28/15 1802  GLUCAP 185* 227* 271*    Radiological Exams: Dg Chest Port 1 View  12/24/2015  CLINICAL DATA:  Weakness for 2 days.  Fall 1 day  prior EXAM: PORTABLE CHEST 1 VIEW COMPARISON:  July 28, 2015 FINDINGS: There is no edema or consolidation. The heart size and pulmonary vascularity are normal. No pneumothorax. No adenopathy. There is extensive arthropathy in both shoulders. No acute fracture evident. There is calcification in the left carotid artery. IMPRESSION: No edema or consolidation. No pneumothorax. Focal area of carotid artery calcification. Arthropathy in both shoulders. Electronically Signed   By: Lowella Grip III M.D.   On: 12/24/2015 13:56    Assessment/Plan  Generalized weakness Will have him work with physical therapy and occupational therapy team to help with gait training and muscle strengthening exercises.fall precautions. Skin care. Encourage to be out of bed.   Right hand cellulitis Continue keflex 500 mg bid until 01/01/16. Per patient his pain and swelling has not improved. No demarcated line to compare for cellulitis improvement. Check cbc with diff in am  Right hand pain With swelling and erythema. i have concern for acute gout episode vs inflammatory arthritis. Check uric acid level. Start prednisone 50 mg daily x 5 days and monitor. Start tylenol 500 mg tid for pain and continue norco 5-325 mg 2 tab q4h prn pain.   Anemia of chronic disease Monitor cbc  ckd stage 3 Monitor bmp, s/p iv fluids for acute worsening of renal function at hospital. Avoid NSAIDs  Chronic respiratory failure Continue o2 2l/min continuous, continue prn albuterol  HLD Continue lipitor, check lipid panel Lipid Panel     Component Value Date/Time   CHOL 141 05/19/2014 0844   TRIG 149.0 05/19/2014 0844   HDL 31.50* 05/19/2014 0844   CHOLHDL 4 05/19/2014 0844   VLDL 29.8 05/19/2014 0844   LDLCALC 80 05/19/2014 0844   CAD Chest pain free. Continue coreg 12.5 mg bid with statin and aspirin. Monitor bp  DM type 2 Lab Results  Component Value Date   HGBA1C 9.1* 11/27/2014   Check a1c. Currently on tuejeo 40 u  qhs with aspart 70/30 40 u bid. Per patient was only taking tuejeo at home. D/c aspart 70/30. Monitor cbg qac and hs for now. Start humalog 5 u for cbg > 150 with meals.   HTN Monitor bp bid x 1 week. Continue coreg   Goals of care: short term rehabilitation   Labs/tests ordered: cbc with diff, cmp, tsh, a1c, uric acid 01/01/16  Family/ staff Communication: reviewed care plan with patient and nursing supervisor    Blanchie Serve, MD Internal Medicine [place], [place] [address] Cell Phone (Monday-Friday 8 am - 5 pm): [phone] On Call: [phone] and follow prompts after 5 pm and on weekends Office Phone: [phone] Office Fax: [phone]

## 2016-01-01 LAB — LIPID PANEL
CHOLESTEROL: 137 mg/dL (ref 0–200)
HDL: 28 mg/dL — AB (ref 35–70)
LDL Cholesterol: 86 mg/dL
TRIGLYCERIDES: 117 mg/dL (ref 40–160)

## 2016-01-01 LAB — HEPATIC FUNCTION PANEL
ALT: 26 U/L (ref 10–40)
AST: 22 U/L (ref 14–40)
Alkaline Phosphatase: 122 U/L (ref 25–125)
Bilirubin, Total: 0.4 mg/dL

## 2016-01-01 LAB — HEMOGLOBIN A1C: HEMOGLOBIN A1C: 10

## 2016-01-01 LAB — CBC AND DIFFERENTIAL
HCT: 36 % — AB (ref 41–53)
HEMOGLOBIN: 11.6 g/dL — AB (ref 13.5–17.5)
Platelets: 362 10*3/uL (ref 150–399)
WBC: 7.9 10*3/mL

## 2016-01-01 LAB — BASIC METABOLIC PANEL
BUN: 59 mg/dL — AB (ref 4–21)
CREATININE: 1.9 mg/dL — AB (ref 0.6–1.3)
Glucose: 203 mg/dL
POTASSIUM: 3.9 mmol/L (ref 3.4–5.3)
SODIUM: 141 mmol/L (ref 137–147)

## 2016-01-01 LAB — TSH: TSH: 1.01 u[IU]/mL (ref 0.41–5.90)

## 2016-01-04 ENCOUNTER — Non-Acute Institutional Stay (SKILLED_NURSING_FACILITY): Payer: Commercial Managed Care - HMO | Admitting: Internal Medicine

## 2016-01-04 ENCOUNTER — Encounter: Payer: Self-pay | Admitting: Internal Medicine

## 2016-01-04 DIAGNOSIS — E1122 Type 2 diabetes mellitus with diabetic chronic kidney disease: Secondary | ICD-10-CM | POA: Diagnosis not present

## 2016-01-04 DIAGNOSIS — E1165 Type 2 diabetes mellitus with hyperglycemia: Secondary | ICD-10-CM | POA: Diagnosis not present

## 2016-01-04 DIAGNOSIS — M10041 Idiopathic gout, right hand: Secondary | ICD-10-CM | POA: Diagnosis not present

## 2016-01-04 DIAGNOSIS — N183 Chronic kidney disease, stage 3 unspecified: Secondary | ICD-10-CM

## 2016-01-04 DIAGNOSIS — Z794 Long term (current) use of insulin: Secondary | ICD-10-CM | POA: Diagnosis not present

## 2016-01-04 DIAGNOSIS — IMO0002 Reserved for concepts with insufficient information to code with codable children: Secondary | ICD-10-CM

## 2016-01-04 DIAGNOSIS — M109 Gout, unspecified: Secondary | ICD-10-CM

## 2016-01-04 NOTE — Progress Notes (Signed)
Patient ID: [name] [name], male   DOB: [birth year], 70 y.o.   MRN: [id number]     LOCATION: Facility: [facility]    PCP: Cathlean Cower, MD   Code Status: DNR   Goals of care: Advanced Directive information Advanced Directives 12/31/2015  Does patient have an advance directive? Yes  Type of Advance Directive Out of facility DNR (pink MOST or yellow form)  Does patient want to make changes to advanced directive? No - Patient declined  Copy of advanced directive(s) in chart? Yes       Extended Emergency Contact Information Primary Emergency Contact: [place] of [place] Mobile Phone: [phone] Relation: Daughter Secondary Emergency Contact: Clovis Fredrickson [place] Mobile Phone: [phone] Relation: Sister   Allergies  Allergen Reactions  . Other Hives    Poison ivy.     Chief Complaint  Patient presents with  . Acute Visit    Elevated uric acid & A1C     HPI:  Patient is a 70 y.o. male seen today for acute visit. He was treated for acute gout on last encounter and he has improvement in his swelling and pain. His ROM to the fingers have improved. Lab work shows elevated uric acid level and elevated a1c suggestive of gout and poorly controlled DM. He is eating chocolate in his room at present. cbg on review in the facility is 207-399. cbg this am 205 before breakfast.   Review of Systems:  Constitutional: Negative for fever, chills, diaphoresis.  HENT: Negative for headache, congestion.   Eyes: Negative for blurred vision.  Respiratory: Negative for cough, shortness of breath and wheezing.   Cardiovascular: Negative for chest pain, palpitations, leg swelling.  Gastrointestinal: Negative for heartburn, nausea, vomiting, abdominal pain.  Genitourinary: Negative for dysuria and flank pain.  Musculoskeletal: Negative for fall.  Skin: Negative for itching, rash.  Neurological: Negative for  dizziness.    Past Medical History  Diagnosis Date  . Ischemic cardiomyopathy   . Hypertension   . Hypercholesterolemia   . Lower GI bleeding 1980's    "when I was drinking alot" (06/06/2012)  . Chronic combined systolic and diastolic CHF (congestive heart failure) ([place]) 07/10/2012    a. EF 83-15%, + diastolic dysfunction by echo 12/2012.  Marland Kitchen GERD (gastroesophageal reflux disease) 07/12/2012  . Coronary artery disease     a. Cath 05/2012 with CTO of RCA; s/p BMS to severe OM-1 stenosis.  Marland Kitchen History of alcoholism ([place])     "haven't had a drink in 23 yr" (09/12/2014)  . Moderate aortic stenosis     a. Mod by echo 12/2012.  Marland Kitchen Pancreatitis   . Diverticulosis   . Personal history of rectal adenoma 05/28/2013  . Dietary noncompliance   . Morbid obesity ([place])   . Lymphoma ([place])     a. 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  . Shortness of breath   . Myocardial infarction ([place]) 11/13  . Pneumonia 05/2012    "right after stent was put in"  . OSA on CPAP   . On home oxygen therapy     "3L; pretty much last 1 1/2 weeks" (09/12/2014)  . Type II diabetes mellitus ([place])   . History of stomach ulcers     "when I drank alot; not bothered w/them since I quit drinking"  . Arthritis     "both shoulders" (09/12/2014)   Past Surgical History  Procedure Laterality  Date  . Coronary angioplasty with stent placement  06/06/2012    "1"  . Lymph gland excision  07/13/2012    Procedure: CERVICAL LYMPH GLAND EXCISION;  Surgeon: Haywood Lasso, MD;  Location: [place];  Service: General;  Laterality: Right;  . Portacath placement  07/30/2012    Procedure: INSERTION PORT-A-CATH;  Surgeon: Haywood Lasso, MD;  Location: [place];  Service: General;  Laterality: Right;  . Colonoscopy N/A 05/28/2013    Procedure: COLONOSCOPY;  Surgeon: Gatha Mayer, MD;  Location: [facility];  Service: Endoscopy;   Laterality: N/A;  . Left and right heart catheterization with coronary angiogram N/A 06/06/2012    Procedure: LEFT AND RIGHT HEART CATHETERIZATION WITH CORONARY ANGIOGRAM;  Surgeon: Pixie Casino, MD;  Location: [facility] CATH LAB;  Service: Cardiovascular;  Laterality: N/A;  . Percutaneous coronary stent intervention (pci-s)  06/06/2012    Procedure: PERCUTANEOUS CORONARY STENT INTERVENTION (PCI-S);  Surgeon: Troy Sine, MD;  Location: [facility] CATH LAB;  Service: Cardiovascular;;  . Cardiac catheterization  1//20/2013    28fench atrerial sheath     Medications:   Medication List       This list is accurate as of: 01/04/16 12:05 PM.  Always use your most recent med list.               ACCU-CHEK AVIVA PLUS w/Device Kit  Use to check blood sugars twice a day Dx 250.00     acetaminophen 500 MG tablet  Commonly known as:  TYLENOL  Take 500 mg by mouth 3 (three) times daily. Stop date 01/07/16     albuterol 108 (90 Base) MCG/ACT inhaler  Commonly known as:  PROVENTIL HFA;VENTOLIN HFA  Inhale 2 puffs into the lungs every 6 (six) hours as needed for wheezing or shortness of breath.     AMBULATORY NON FORMULARY MEDICATION  3 liters via nasal canula continuous.     AMBULATORY NON FORMULARY MEDICATION  CPAP with O2 @ 3 Liters Home settings     aspirin EC 81 MG tablet  Take 81 mg by mouth daily.     atorvastatin 20 MG tablet  Commonly known as:  LIPITOR  Take 1 tablet (20 mg total) by mouth daily at 6 PM.     carvedilol 12.5 MG tablet  Commonly known as:  COREG  Take 1 tablet (12.5 mg total) by mouth 2 (two) times daily.     cyclobenzaprine 5 MG tablet  Commonly known as:  FLEXERIL  Take 1 tablet (5 mg total) by mouth at bedtime.     glucose blood test strip  Commonly known as:  ACCU-CHEK AVIVA PLUS  Use to check blood sugars five times a day Dx E11.9     HYDROcodone-acetaminophen 5-325 MG tablet  Commonly known as:  NORCO/VICODIN  Take 2 tablets by mouth every 4 (four) hours  as needed.     Insulin Glargine 300 UNIT/ML Sopn  Commonly known as:  TOUJEO SOLOSTAR  Inject 40 Units into the skin at bedtime.     insulin lispro 100 UNIT/ML injection  Commonly known as:  HUMALOG  Inject 5 Units into the skin 3 (three) times daily with meals.     Insulin Pen Needle 32G X 4 MM Misc  Commonly known as:  BD PEN NEEDLE NANO U/F  Use as directed 1 per day   250.02     predniSONE 50 MG tablet  Commonly known as:  DELTASONE  Take 50 mg by mouth  daily with breakfast. Stop date 01/05/16        Immunizations: Immunization History  Administered Date(s) Administered  . Influenza Split 07/14/2012  . Influenza,inj,Quad PF,36+ Mos 04/19/2013, 04/09/2014  . Pneumococcal Conjugate-13 02/04/2014  . Pneumococcal Polysaccharide-23 02/10/2015     Physical Exam: Filed Vitals:   01/04/16 1155  BP: 129/71  Pulse: 94  Temp: 98 F (36.7 C)  TempSrc: Oral  Resp: 20  Height: _0  (1.88 m)  Weight: 284 lb 6.4 oz (129.003 kg)  SpO2: 95%   Body mass index is 36.5 kg/(m^2).  General- elderly male, obese, in no acute distress Head- normocephalic, atraumatic Throat- moist mucus membrane  Neck- no cervical lymphadenopathy Cardiovascular- normal s1,s2, no murmur, trace leg edema, good radial pulse Respiratory- bilateral clear to auscultation, no wheeze, no rhonchi, no crackles, no use of accessory muscles Abdomen- bowel sounds present, soft, non tender Musculoskeletal- able to move all 4 extremities, generalized weakness, on wheelchair, has arthritis changes to his fingers, has tophi to his joints, some limitation to flexion of his DIP present Neurological- alert and oriented to person, place and time Skin- warm and dry Psychiatry- normal mood and affect    Labs reviewed: Basic Metabolic Panel:  Recent Labs  12/24/15 1145 12/24/15 1756 12/25/15 0432 12/27/15 1139 01/01/16  NA 135  --  135 131* 141  K 3.1*  --  3.6 4.0 3.9  CL 92*  --  95* 93*  --   CO2 23  --   25 26  --   GLUCOSE 224*  --  258* 327*  --   BUN 74*  --  79* 66* 59*  CREATININE 2.51*  --  2.40* 2.11* 1.9*  CALCIUM 9.6  --  9.2 9.2  --   MG  --  2.3  --   --   --    Liver Function Tests:  Recent Labs  02/05/15 0940 12/24/15 1145 12/27/15 1139 01/01/16  AST 14 32 29 22  ALT _1 ALKPHOS 60 74 90 122  BILITOT 0.35 1.7* 0.9  --   PROT 7.6 8.1 7.4  --   ALBUMIN 4.3 3.7 2.6*  --    No results for input(s): LIPASE, AMYLASE in the last 8760 hours.  Recent Labs  12/27/15 1139  AMMONIA 26   CBC:  Recent Labs  02/05/15 0940  12/24/15 1145 12/25/15 0432 12/28/15 0516 01/01/16  WBC 8.4  < > 13.3* 11.2* 9.3 7.9  NEUTROABS 5.2  --  9.7*  --   --   --   HGB 12.1*  < > 13.8 12.4* 11.5* 11.6*  HCT 36.5*  < > 41.9 37.3* 36.2* 36*  MCV 88.4  --  87.3 86.3 88.3  --   PLT 175  < > 187 185 225 362  < > = values in this interval not displayed.  Lab Results  Component Value Date   HGBA1C 10.0 01/01/2016     Assessment/Plan  Acute gout With right hand and DIP/PIP swelling. Improved some with prednisone. Currently on prednisone 50 mg daily. Change this to 40 mg daily from tomorrow x 3 days, then 20 mg daily x 3 days and stop. 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 Avoid NSAIDs  DM type 2 Lab Results  Component Value Date   HGBA1C 10.0 01/01/2016   a1c suggestive of poorly controlled diabetes.  Currently on tuejeo 40 u qhs and has elevated cbg. Change tuejeo to 45 u daily for now and add humalog SSI of facility with meals. Review cbg reading in 1 week and adjust further if needed    Labs/tests ordered: cbg monitoring  Family/ staff Communication: reviewed care plan with patient and nursing supervisor    Blanchie Serve, MD Internal Medicine [place], [place] [address] Cell Phone (Monday-Friday 8 am - 5  pm): [phone] On Call: [phone] and follow prompts after 5 pm and on weekends Office Phone: [phone] Office Fax: [phone]

## 2016-01-08 ENCOUNTER — Encounter: Payer: Self-pay | Admitting: Internal Medicine

## 2016-01-08 ENCOUNTER — Non-Acute Institutional Stay (SKILLED_NURSING_FACILITY): Payer: Commercial Managed Care - HMO | Admitting: Internal Medicine

## 2016-01-08 DIAGNOSIS — R3911 Hesitancy of micturition: Secondary | ICD-10-CM | POA: Diagnosis not present

## 2016-01-08 DIAGNOSIS — I5042 Chronic combined systolic (congestive) and diastolic (congestive) heart failure: Secondary | ICD-10-CM | POA: Diagnosis not present

## 2016-01-08 DIAGNOSIS — N183 Chronic kidney disease, stage 3 unspecified: Secondary | ICD-10-CM

## 2016-01-08 DIAGNOSIS — R600 Localized edema: Secondary | ICD-10-CM

## 2016-01-08 DIAGNOSIS — E1122 Type 2 diabetes mellitus with diabetic chronic kidney disease: Secondary | ICD-10-CM | POA: Diagnosis not present

## 2016-01-08 DIAGNOSIS — Z794 Long term (current) use of insulin: Secondary | ICD-10-CM | POA: Diagnosis not present

## 2016-01-08 NOTE — Progress Notes (Signed)
Patient ID: [name] [name], male   DOB: Apr 17, 1946, 70 y.o.   MRN: [id number]     LOCATION: Facility: [facility]    PCP: Cathlean Cower, MD   Code Status: DNR   Goals of care: Advanced Directive information Advanced Directives 12/31/2015  Does patient have an advance directive? Yes  Type of Advance Directive Out of facility DNR (pink MOST or yellow form)  Does patient want to make changes to advanced directive? No - Patient declined  Copy of advanced directive(s) in chart? Yes       Extended Emergency Contact Information Primary Emergency Contact: [place] Mobile Phone: [phone] Relation: Daughter Secondary Emergency Contact: [place] Mobile Phone: [phone] Relation: Sister   Allergies  Allergen Reactions  . Other Hives    Poison ivy.     Chief Complaint  Patient presents with  . Acute Visit    leg edema and elevated cbg readings     HPI:  Patient is a 70 y.o. male seen today for acute visit. He has noticed increased swelling to his legs. He would like his home regimen torsemide and metolazone resumed. He was on torsemide 20 mg bid with metolazone 5 mg daily at home. He mentions having gained weight from baseline. These were discontinued at discharge from hospital with his worsening renal function. He was on lasix recently for 2 days and has lost 2 lbs. His cbg reading have been elevated. He has DM and is currently on prednisone. cbg on review 244-398 with one reading of 184.   Review of Systems:  Constitutional: Negative for fever, chills, diaphoresis.  HENT: Negative for headache, congestion.   Eyes: Negative for blurred vision.  Respiratory: Negative for cough, wheezing.  has some dyspnea with exertion Cardiovascular: Negative for chest pain, palpitations. Positive for leg swelling.  Gastrointestinal: Negative for heartburn, nausea, vomiting, abdominal pain.    Genitourinary: Negative for dysuria and flank pain. has poor urine outflow with hesitancy Musculoskeletal: Negative for fall.  Skin: Negative for itching, rash.  Neurological: Negative for dizziness.    Past Medical History  Diagnosis Date  . Ischemic cardiomyopathy   . Hypertension   . Hypercholesterolemia   . Lower GI bleeding 1980's    "when I was drinking alot" (06/06/2012)  . Chronic combined systolic and diastolic CHF (congestive heart failure) ([place]) 07/10/2012    a. EF 65-78%, + diastolic dysfunction by echo 12/2012.  Marland Kitchen GERD (gastroesophageal reflux disease) 07/12/2012  . Coronary artery disease     a. Cath 05/2012 with CTO of RCA; s/p BMS to severe OM-1 stenosis.  Marland Kitchen History of alcoholism ([place])     "haven't had a drink in 23 yr" (09/12/2014)  . Moderate aortic stenosis     a. Mod by echo 12/2012.  Marland Kitchen Pancreatitis   . Diverticulosis   . Personal history of rectal adenoma 05/28/2013  . Dietary noncompliance   . Morbid obesity ([place])   . Lymphoma ([place])     a. 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  . Shortness of breath   . Myocardial infarction ([place]) 11/13  . Pneumonia 05/2012    "right after stent was put in"  . OSA on CPAP   . On home oxygen therapy     "3L; pretty much last 1 1/2 weeks" (09/12/2014)  . Type II diabetes mellitus ([place])   . History of stomach ulcers     "  when I drank alot; not bothered w/them since I quit drinking"  . Arthritis     "both shoulders" (09/12/2014)   Past Surgical History  Procedure Laterality Date  . Coronary angioplasty with stent placement  06/06/2012    "1"  . Lymph gland excision  07/13/2012    Procedure: CERVICAL LYMPH GLAND EXCISION;  Surgeon: Currie Paris, MD;  Location: [facility] OR;  Service: General;  Laterality: Right;  . Portacath placement  07/30/2012    Procedure: INSERTION PORT-A-CATH;  Surgeon: Currie Paris, MD;  Location:   SURGERY CENTER;  Service: General;  Laterality: Right;  . Colonoscopy N/A 05/28/2013    Procedure: COLONOSCOPY;  Surgeon: Iva Boop, MD;  Location: [facility] ENDOSCOPY;  Service: Endoscopy;  Laterality: N/A;  . Left and right heart catheterization with coronary angiogram N/A 06/06/2012    Procedure: LEFT AND RIGHT HEART CATHETERIZATION WITH CORONARY ANGIOGRAM;  Surgeon: Chrystie Nose, MD;  Location: [facility] CATH LAB;  Service: Cardiovascular;  Laterality: N/A;  . Percutaneous coronary stent intervention (pci-s)  06/06/2012    Procedure: PERCUTANEOUS CORONARY STENT INTERVENTION (PCI-S);  Surgeon: Lennette Bihari, MD;  Location: [facility] CATH LAB;  Service: Cardiovascular;;  . Cardiac catheterization  1//20/2013    46french atrerial sheath     Medications:   Medication List       This list is accurate as of: 01/08/16 12:28 PM.  Always use your most recent med list.               ACCU-CHEK AVIVA PLUS w/Device Kit  Use to check blood sugars twice a day Dx 250.00     acetaminophen 500 MG tablet  Commonly known as:  TYLENOL  Take 500 mg by mouth 3 (three) times daily. Stop date 01/07/16     albuterol 108 (90 Base) MCG/ACT inhaler  Commonly known as:  PROVENTIL HFA;VENTOLIN HFA  Inhale 2 puffs into the lungs every 6 (six) hours as needed for wheezing or shortness of breath.     allopurinol 100 MG tablet  Commonly known as:  ZYLOPRIM  Take 100 mg by mouth 2 (two) times daily. Stop date 01/18/16. Then start 200 mg by mouth 2 times daily     allopurinol 100 MG tablet  Commonly known as:  ZYLOPRIM  Take 200 mg by mouth 2 (two) times daily.     AMBULATORY NON FORMULARY MEDICATION  3 liters via nasal canula continuous.     AMBULATORY NON FORMULARY MEDICATION  CPAP with O2 @ 3 Liters Home settings     aspirin EC 81 MG tablet  Take 81 mg by mouth daily.     atorvastatin 20 MG tablet  Commonly known as:  LIPITOR  Take 1 tablet (20 mg total) by mouth daily at 6 PM.     carvedilol 12.5 MG tablet   Commonly known as:  COREG  Take 1 tablet (12.5 mg total) by mouth 2 (two) times daily.     cyclobenzaprine 5 MG tablet  Commonly known as:  FLEXERIL  Take 1 tablet (5 mg total) by mouth at bedtime.     furosemide 20 MG tablet  Commonly known as:  LASIX  Take 20 mg by mouth daily.     glucose blood test strip  Commonly known as:  ACCU-CHEK AVIVA PLUS  Use to check blood sugars five times a day Dx E11.9     HYDROcodone-acetaminophen 5-325 MG tablet  Commonly known as:  NORCO/VICODIN  Take 2 tablets by mouth every  4 (four) hours as needed.     insulin lispro 100 UNIT/ML injection  Commonly known as:  HUMALOG  Inject 5 Units into the skin 3 (three) times daily with meals.     Insulin Pen Needle 32G X 4 MM Misc  Commonly known as:  BD PEN NEEDLE NANO U/F  Use as directed 1 per day   250.02     PREDNISONE PO  Take 40 mg by mouth daily. 01/05/16-01/08/16 take 40 mg by mouth daily.     predniSONE 20 MG tablet  Commonly known as:  DELTASONE  Take 20 mg by mouth daily with breakfast. Take from 01/09/16-01/11/16     TOUJEO SOLOSTAR 300 UNIT/ML Sopn  Generic drug:  Insulin Glargine  Inject 45 Units into the skin daily.        Immunizations: Immunization History  Administered Date(s) Administered  . Influenza Split 07/14/2012  . Influenza,inj,Quad PF,36+ Mos 04/19/2013, 04/09/2014  . Pneumococcal Conjugate-13 02/04/2014  . Pneumococcal Polysaccharide-23 02/10/2015     Physical Exam: Filed Vitals:   01/08/16 1148  BP: 143/69  Pulse: 78  Temp: 97.6 F (36.4 C)  TempSrc: Oral  Resp: 20  Height: '6\' 2"'$  (1.88 m)  Weight: 284 lb 6.4 oz (129.003 kg)  SpO2: 98%   Body mass index is 36.5 kg/(m^2).  General- elderly male, obese, in no acute distress Head- normocephalic, atraumatic Throat- moist mucus membrane  Neck- no cervical lymphadenopathy Cardiovascular- normal s1,s2, no murmur, 1+ leg edema, good radial pulse Respiratory- bilateral clear to auscultation, no wheeze,  no rhonchi, no crackles, no use of accessory muscles Abdomen- bowel sounds present, soft, non tender Musculoskeletal- able to move all 4 extremities, generalized weakness, on wheelchair Neurological- alert and oriented to person, place and time Skin- warm and dry Psychiatry- normal mood and affect    Labs reviewed: Basic Metabolic Panel:  Recent Labs  12/24/15 1145 12/24/15 1756 12/25/15 0432 12/27/15 1139 01/01/16  NA 135  --  135 131* 141  K 3.1*  --  3.6 4.0 3.9  CL 92*  --  95* 93*  --   CO2 23  --  25 26  --   GLUCOSE 224*  --  258* 327*  --   BUN 74*  --  79* 66* 59*  CREATININE 2.51*  --  2.40* 2.11* 1.9*  CALCIUM 9.6  --  9.2 9.2  --   MG  --  2.3  --   --   --    Liver Function Tests:  Recent Labs  02/05/15 0940 12/24/15 1145 12/27/15 1139 01/01/16  AST 14 32 29 22  ALT '17 17 23 26  '$ ALKPHOS 60 74 90 122  BILITOT 0.35 1.7* 0.9  --   PROT 7.6 8.1 7.4  --   ALBUMIN 4.3 3.7 2.6*  --    No results for input(s): LIPASE, AMYLASE in the last 8760 hours.  Recent Labs  12/27/15 1139  AMMONIA 26   CBC:  Recent Labs  02/05/15 0940  12/24/15 1145 12/25/15 0432 12/28/15 0516 01/01/16  WBC 8.4  < > 13.3* 11.2* 9.3 7.9  NEUTROABS 5.2  --  9.7*  --   --   --   HGB 12.1*  < > 13.8 12.4* 11.5* 11.6*  HCT 36.5*  < > 41.9 37.3* 36.2* 36*  MCV 88.4  --  87.3 86.3 88.3  --   PLT 175  < > 187 185 225 362  < > = values in this interval not  displayed.  Lab Results  Component Value Date   HGBA1C 10.0 01/01/2016     Assessment/Plan  Leg edema Has history of CHF and CKD both can contribute to leg edema. Advised to keep legs elevated at rest. Add ted hose. D/c lasix with impaired renal function. Start torsemide but at half his home regimen given his ckd. Start torsemide 20 mg daily and metolazone 2.5 mg daily for now. Check daily weight. Restrict fluid intake to 1.5 l/day. Check bmp to assess further  DM type 2 Has uncontrolled diabetes. cbg has been elevated  and he is on tuejeo 45 u at present with SSI with meals. Change his tuejeo to 50 u daily for now with him being on prednisone and monitor cbg reading.   Urinary hesitancy Concern for BPH. Check PSA. Start flomax 0.4 mg daily and monitor  chf Continue coreg. D/c lasix. Start torsemide and metolazone as above. Patient agrees with care plan   ckd stage 3 Reviewed bmp. Avoid NSAIDs. Monitor bmp with him on torsemide and metolazone   Labs/tests ordered: cbg monitoring  Family/ staff Communication: reviewed care plan with patient and nursing supervisor    Blanchie Serve, MD Internal Medicine [place], [place] [address] Cell Phone (Monday-Friday 8 am - 5 pm): [phone] On Call: [phone] and follow prompts after 5 pm and on weekends Office Phone: [phone] Office Fax: [phone]

## 2016-01-13 ENCOUNTER — Encounter: Payer: Self-pay | Admitting: Internal Medicine

## 2016-01-13 ENCOUNTER — Non-Acute Institutional Stay (SKILLED_NURSING_FACILITY): Payer: Commercial Managed Care - HMO | Admitting: Family

## 2016-01-13 ENCOUNTER — Encounter: Payer: Self-pay | Admitting: Family

## 2016-01-13 DIAGNOSIS — M10041 Idiopathic gout, right hand: Secondary | ICD-10-CM

## 2016-01-13 DIAGNOSIS — E0822 Diabetes mellitus due to underlying condition with diabetic chronic kidney disease: Secondary | ICD-10-CM | POA: Diagnosis not present

## 2016-01-13 DIAGNOSIS — I110 Hypertensive heart disease with heart failure: Secondary | ICD-10-CM

## 2016-01-13 DIAGNOSIS — N189 Chronic kidney disease, unspecified: Secondary | ICD-10-CM

## 2016-01-13 DIAGNOSIS — G4733 Obstructive sleep apnea (adult) (pediatric): Secondary | ICD-10-CM | POA: Diagnosis not present

## 2016-01-13 DIAGNOSIS — I25810 Atherosclerosis of coronary artery bypass graft(s) without angina pectoris: Secondary | ICD-10-CM | POA: Diagnosis not present

## 2016-01-13 DIAGNOSIS — Z794 Long term (current) use of insulin: Secondary | ICD-10-CM

## 2016-01-13 DIAGNOSIS — R531 Weakness: Secondary | ICD-10-CM

## 2016-01-13 DIAGNOSIS — M129 Arthropathy, unspecified: Secondary | ICD-10-CM

## 2016-01-13 DIAGNOSIS — E785 Hyperlipidemia, unspecified: Secondary | ICD-10-CM | POA: Diagnosis not present

## 2016-01-13 DIAGNOSIS — R269 Unspecified abnormalities of gait and mobility: Secondary | ICD-10-CM

## 2016-01-13 DIAGNOSIS — N183 Chronic kidney disease, stage 3 unspecified: Secondary | ICD-10-CM

## 2016-01-13 DIAGNOSIS — I5042 Chronic combined systolic (congestive) and diastolic (congestive) heart failure: Secondary | ICD-10-CM | POA: Diagnosis not present

## 2016-01-13 DIAGNOSIS — M109 Gout, unspecified: Secondary | ICD-10-CM

## 2016-01-13 DIAGNOSIS — M19019 Primary osteoarthritis, unspecified shoulder: Secondary | ICD-10-CM

## 2016-01-13 NOTE — Progress Notes (Signed)
Patient ID: [name] [name], male   DOB: 28-Apr-1946, 70 y.o.   MRN: [id number]  Location:   [facility] Room Number: 104-P Place of Service:  SNF [phone])  Provider: Richarda Blade FNP-C   PCP: Oliver Barre, MD Patient Care Team: Corwin Levins, MD as PCP - General (Internal Medicine) Benjiman Core, MD (Hematology and Oncology)  Extended Emergency Contact Information Primary Emergency Contact: Gerarda Fraction [place] Mobile Phone: [phone] Relation: Daughter Secondary Emergency Contact: Lovette Cliche [place] Mobile Phone: [phone] Relation: Sister  Code Status: DNR  Goals of care:  Advanced Directive information Advanced Directives 12/31/2015  Does patient have an advance directive? Yes  Type of Advance Directive Out of facility DNR (pink MOST or yellow form)  Does patient want to make changes to advanced directive? No - Patient declined  Copy of advanced directive(s) in chart? Yes     Allergies  Allergen Reactions  . Other Hives    Poison ivy.     Chief Complaint  Patient presents with  . Discharge Note    Discharge Visit    HPI:  70 y.o. male  Seen today at [facility] for discharge home. He was here for short term rehabilitation post hospital admission from 12/24/15-12/28/15 with acute encephalopathy with right hand cellulitis and rhabdomyolysis with AKI.He was started on antibiotics. He has a medical history of HTN, CHF, CAD,OA, Hyperlipidemia, sleep Apnea among others.He completed antibiotics for right hand cellulitis without any improvement. Uric Acid 12.7 while at Rehab and was treated with Tapered Prednisone and Allopurinol initiated with much improvement. He is seen in his room today. He denies any acute issues. Back and shoulder pain under control with current regimen. He has worked well with PT/OT now stable for discharge home.He will be discharged home with Home health PT/OT to  continue with ROM, Exercise, Gait stability and muscle strengthening. He does not require any DME states has own walker at home.Also has own oxygen concentrator and portable tank at home. Home health services will be arranged by facility social worker prior to discharge. Prescription medication will be written x 1 month then patient to follow up with PCP in 1-2 weeks. Facility staff report no new concerns.     Past Medical History  Diagnosis Date  . Ischemic cardiomyopathy   . Hypertension   . Hypercholesterolemia   . Lower GI bleeding 1980's    "when I was drinking alot" (06/06/2012)  . Chronic combined systolic and diastolic CHF (congestive heart failure) (HCC) 07/10/2012    a. EF 35-40%, + diastolic dysfunction by echo 12/2012.  Marland Kitchen GERD (gastroesophageal reflux disease) 07/12/2012  . Coronary artery disease     a. Cath 05/2012 with CTO of RCA; s/p BMS to severe OM-1 stenosis.  Marland Kitchen History of alcoholism (HCC)     "haven't had a drink in 23 yr" (09/12/2014)  . Moderate aortic stenosis     a. Mod by echo 12/2012.  Marland Kitchen Pancreatitis   . Diverticulosis   . Personal history of rectal adenoma 05/28/2013  . Dietary noncompliance   . Morbid obesity (HCC)   . Lymphoma (HCC)     a. 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  . Shortness of breath   . Myocardial infarction (HCC) 11/13  . Pneumonia 05/2012    "right after stent was put in"  . OSA on CPAP   .  On home oxygen therapy     "3L; pretty much last 1 1/2 weeks" (09/12/2014)  . Type II diabetes mellitus ([place])   . History of stomach ulcers     "when I drank alot; not bothered w/them since I quit drinking"  . Arthritis     "both shoulders" (09/12/2014)    Past Surgical History  Procedure Laterality Date  . Coronary angioplasty with stent placement  06/06/2012    "1"  . Lymph gland excision  07/13/2012    Procedure: CERVICAL LYMPH GLAND EXCISION;  Surgeon:  Haywood Lasso, MD;  Location: [place];  Service: General;  Laterality: Right;  . Portacath placement  07/30/2012    Procedure: INSERTION PORT-A-CATH;  Surgeon: Haywood Lasso, MD;  Location: [place];  Service: General;  Laterality: Right;  . Colonoscopy N/A 05/28/2013    Procedure: COLONOSCOPY;  Surgeon: Gatha Mayer, MD;  Location: [facility];  Service: Endoscopy;  Laterality: N/A;  . Left and right heart catheterization with coronary angiogram N/A 06/06/2012    Procedure: LEFT AND RIGHT HEART CATHETERIZATION WITH CORONARY ANGIOGRAM;  Surgeon: Pixie Casino, MD;  Location: [facility] CATH LAB;  Service: Cardiovascular;  Laterality: N/A;  . Percutaneous coronary stent intervention (pci-s)  06/06/2012    Procedure: PERCUTANEOUS CORONARY STENT INTERVENTION (PCI-S);  Surgeon: Troy Sine, MD;  Location: [facility] CATH LAB;  Service: Cardiovascular;;  . Cardiac catheterization  1//20/2013    4fench atrerial sheath      reports that he quit smoking about 2 years ago. His smoking use included Cigarettes. He has a 106 pack-year smoking history. He has never used smokeless tobacco. He reports that he does not drink alcohol or use illicit drugs. Social History   Social History  . Marital Status: Divorced    [name]  . Number of Children: 2  . Years of Education: 12   Occupational History  . retired    Social History Main Topics  . Smoking status: Former Smoker -- 2.00 packs/day for 53 years    Types: Cigarettes    Quit date: 07/18/2013  . Smokeless tobacco: Never Used  . Alcohol Use: No     Comment: Hasn't drank since 16269(former alcoholic)  . Drug Use: No  . Sexual Activity: No   Other Topics Concern  . 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   Lives by self, since '78   [name]    Regular exercise-no   Caffeine Use-no   Functional Status Survey:    Allergies  Allergen Reactions  .  Other Hives    Poison ivy.     Pertinent  Health Maintenance Due  Topic Date Due  . OPHTHALMOLOGY EXAM  10/07/1956  . FOOT EXAM  02/05/2015  . URINE MICROALBUMIN  02/05/2015  . INFLUENZA VACCINE  02/16/2016  . HEMOGLOBIN A1C  07/02/2016  . COLONOSCOPY  05/29/2023  . PNA vac Low Risk Adult  Completed    Medications:   Medication List       This list is accurate as of: 01/13/16  1:59 PM.  Always use your most recent med list.               ACCU-CHEK AVIVA PLUS w/Device Kit  Use to check blood sugars twice a day Dx 250.00     albuterol 108 (90 Base) MCG/ACT inhaler  Commonly known as:  PROVENTIL HFA;VENTOLIN HFA  Inhale 2  puffs into the lungs every 6 (six) hours as needed for wheezing or shortness of breath.     allopurinol 100 MG tablet  Commonly known as:  ZYLOPRIM  Take 100 mg by mouth 2 (two) times daily. Stop date 01/18/16. Then start 200 mg by mouth 2 times daily     allopurinol 100 MG tablet  Commonly known as:  ZYLOPRIM  Take 200 mg by mouth 2 (two) times daily.     AMBULATORY NON FORMULARY MEDICATION  3 liters via nasal canula continuous.     AMBULATORY NON FORMULARY MEDICATION  CPAP with O2 @ 3 Liters Home settings     aspirin EC 81 MG tablet  Take 81 mg by mouth daily.     atorvastatin 20 MG tablet  Commonly known as:  LIPITOR  Take 1 tablet (20 mg total) by mouth daily at 6 PM.     carvedilol 12.5 MG tablet  Commonly known as:  COREG  Take 1 tablet (12.5 mg total) by mouth 2 (two) times daily.     cyclobenzaprine 5 MG tablet  Commonly known as:  FLEXERIL  Take 1 tablet (5 mg total) by mouth at bedtime.     glucose blood test strip  Commonly known as:  ACCU-CHEK AVIVA PLUS  Use to check blood sugars five times a day Dx E11.9     HYDROcodone-acetaminophen 5-325 MG tablet  Commonly known as:  NORCO/VICODIN  Take 2 tablets by mouth every 4 (four) hours as needed.     insulin lispro 100 UNIT/ML injection  Commonly known as:  HUMALOG  Inject 5  Units into the skin 3 (three) times daily with meals.     Insulin Pen Needle 32G X 4 MM Misc  Commonly known as:  BD PEN NEEDLE NANO U/F  Use as directed 1 per day   250.02     metolazone 2.5 MG tablet  Commonly known as:  ZAROXOLYN  Take 2.5 mg by mouth daily.     tamsulosin 0.4 MG Caps capsule  Commonly known as:  FLOMAX  Take 0.4 mg by mouth daily.     torsemide 20 MG tablet  Commonly known as:  DEMADEX  Take 20 mg by mouth daily.     TOUJEO SOLOSTAR 300 UNIT/ML Sopn  Generic drug:  Insulin Glargine  Inject 50 Units into the skin daily.        Review of Systems  Constitutional: Negative for fever, chills, activity change and appetite change.  HENT: Negative for congestion, rhinorrhea, sinus pressure, sneezing and sore throat.   Eyes: Negative.   Respiratory: Negative for cough, chest tightness, shortness of breath and wheezing.   Cardiovascular: Negative for chest pain and palpitations.  Gastrointestinal: Negative for nausea, vomiting, abdominal pain, diarrhea, constipation and abdominal distention.       LBM 01/12/2016  Endocrine: Negative.   Genitourinary: Negative for dysuria, urgency and frequency.  Musculoskeletal: Positive for back pain and gait problem.       Shoulder pain   Skin: Negative.   Neurological: Negative for dizziness, seizures and headaches.       Generalized weakness   Hematological: Does not bruise/bleed easily.  Psychiatric/Behavioral: Negative for hallucinations, confusion, sleep disturbance and agitation. The patient is not nervous/anxious.     Filed Vitals:   01/13/16 0930  BP: 140/61  Pulse: 82  Temp: 97.6 F (36.4 C)  TempSrc: Oral  Resp: 20  Height: '6\' 2"'$  (1.88 m)  Weight: 284 lb 6.4 oz (129.003 kg)  SpO2: 98%   Body mass index is 36.5 kg/(m^2). Physical Exam  Constitutional: He is oriented to person, place, and time. He appears well-nourished. No distress.  Obese   HENT:  Head: Normocephalic.  Mouth/Throat: Oropharynx is  clear and moist. No oropharyngeal exudate.  Eyes: Conjunctivae and EOM are normal. Pupils are equal, round, and reactive to light. Right eye exhibits no discharge. Left eye exhibits no discharge. No scleral icterus.  Neck: Normal range of motion. No JVD present. No thyromegaly present.  Cardiovascular: Normal rate, regular rhythm, normal heart sounds and intact distal pulses.  Exam reveals no gallop and no friction rub.   No murmur heard. Pulmonary/Chest: Effort normal and breath sounds normal. No respiratory distress. He has no wheezes. He has no rales.  Abdominal: Soft. Bowel sounds are normal. He exhibits no distension. There is no tenderness. There is no rebound and no guarding.  Musculoskeletal: He exhibits no tenderness.  Move x 4 extremities, Generalized weakness. Trace edema to lower extremities.   Lymphadenopathy:    He has no cervical adenopathy.  Neurological: He is oriented to person, place, and time.  Skin: Skin is warm and dry. No rash noted. No erythema. No pallor.  Psychiatric: He has a normal mood and affect.    Labs reviewed: Basic Metabolic Panel:  Recent Labs  12/24/15 1145 12/24/15 1756 12/25/15 0432 12/27/15 1139 01/01/16  NA 135  --  135 131* 141  K 3.1*  --  3.6 4.0 3.9  CL 92*  --  95* 93*  --   CO2 23  --  25 26  --   GLUCOSE 224*  --  258* 327*  --   BUN 74*  --  79* 66* 59*  CREATININE 2.51*  --  2.40* 2.11* 1.9*  CALCIUM 9.6  --  9.2 9.2  --   MG  --  2.3  --   --   --    Liver Function Tests:  Recent Labs  02/05/15 0940 12/24/15 1145 12/27/15 1139 01/01/16  AST 14 32 29 22  ALT '17 17 23 26  '$ ALKPHOS 60 74 90 122  BILITOT 0.35 1.7* 0.9  --   PROT 7.6 8.1 7.4  --   ALBUMIN 4.3 3.7 2.6*  --    No results for input(s): LIPASE, AMYLASE in the last 8760 hours.  Recent Labs  12/27/15 1139  AMMONIA 26   CBC:  Recent Labs  02/05/15 0940  12/24/15 1145 12/25/15 0432 12/28/15 0516 01/01/16  WBC 8.4  < > 13.3* 11.2* 9.3 7.9  NEUTROABS  5.2  --  9.7*  --   --   --   HGB 12.1*  < > 13.8 12.4* 11.5* 11.6*  HCT 36.5*  < > 41.9 37.3* 36.2* 36*  MCV 88.4  --  87.3 86.3 88.3  --   PLT 175  < > 187 185 225 362  < > = values in this interval not displayed. Cardiac Enzymes:  Recent Labs  12/24/15 1559  12/24/15 2240  12/26/15 0220 12/27/15 0405 12/28/15 0516  CKTOTAL  --   < > 874*  < > 392 233 792*  TROPONINI 0.13*  --  0.09*  --   --   --   --   < > = values in this interval not displayed. BNP: Invalid input(s): POCBNP CBG:  Recent Labs  12/28/15 0540 12/28/15 1146 12/28/15 1802  GLUCAP 185* 227* 271*    Procedures and Imaging Studies During Stay: Dg Chest  Port 1 View  12/24/2015  CLINICAL DATA:  Weakness for 2 days.  Fall 1 day prior EXAM: PORTABLE CHEST 1 VIEW COMPARISON:  July 28, 2015 FINDINGS: There is no edema or consolidation. The heart size and pulmonary vascularity are normal. No pneumothorax. No adenopathy. There is extensive arthropathy in both shoulders. No acute fracture evident. There is calcification in the left carotid artery. IMPRESSION: No edema or consolidation. No pneumothorax. Focal area of carotid artery calcification. Arthropathy in both shoulders. Electronically Signed   By: Lowella Grip III M.D.   On: 12/24/2015 13:56    Assessment/Plan:   1. Hypertensive heart disease with CHF (congestive heart failure) (HCC) B/p stable. Continue Carvedilol 12.5 mg tablet. BMP in 1-2 weeks with PCP    2. Chronic combined systolic and diastolic CHF (congestive heart failure) (HCC) Stable. Exam findings negative for wheezing, Shortness of breath or rales. Trace edema to lower extremities though much improvement. Continue bilateral lower extremities knee high Ted hose on in AM and off at bedtime. Continue Metolazone 2.5 mg tablet daily, Torsemide 20 mg tablet and Coreg 12.5 mg Tablet. Daily weight notify provider if gain > 3 lbs in one day.   3. Coronary artery disease involving coronary bypass graft  of native heart without angina pectoris Chest pain free. Continue on ASA EC 81 mg Tablet and Lipitor 20 mg Tablet.   4. Obstructive sleep apnea Continue with home C-Pap and Oxygen   5. Diabetes mellitus due to underlying condition with stage 3 chronic kidney disease, with long-term current use of insulin (HCC) Recent Hgb A1C 10.0 ( 01/01/2016)  Toujeo increased to 50 units SQ daily. Continue on Humalog 5 units with meals  if CBG's > 150   6. Chronic renal insufficiency, stage III (moderate) Continue to monitor and avoid nephrotoxins and dose all other medication for renal clearance. BMP in 1-2 weeks with PCP.   7. Dyslipidemia, LDL 105 Continue on Lipitor 20 mg tablet. Monitor Lipid panel.   8. Arthritis of shoulder Current pain regimen effective.   9. Generalized weakness Has improved with PT/OT.  will be discharged home with Home health PT/OT to continue with ROM, Exercise, Gait stability and muscle strengthening.  10. Abnormality of gait  will be discharged home with Home health PT/OT to continue with ROM, Exercise, Gait stability and muscle strengthening. He does not require any DME states has own walker at home. Fall and safety precautions.   11. Gout  Right  Hand; resolved. Continue with Allopurinol   Patient is being discharged with the following home health services:    Home health PT/OT to continue with ROM, Exercise, Gait stability and muscle strengthening.   Patient is being discharged with the following durable medical equipment:   He does not require any DME states has own walker at home.Also has own oxygen concentrator and portable tank at home.   RX written x one month supply.   Patient has been advised to f/u with their PCP in 1-2 weeks to bring them up to date on their rehab stay.  Social services at facility was responsible for arranging this appointment.  Pt was provided with a 30 day supply of prescriptions for medications and refills must be obtained from their  PCP.  For controlled substances, a more limited supply may be provided adequate until PCP appointment only.  Future labs/tests needed:  CBC, BMP in 1-2 weeks with PCP

## 2016-01-13 NOTE — Progress Notes (Signed)
Opened in error

## 2016-01-29 ENCOUNTER — Inpatient Hospital Stay: Payer: Self-pay | Admitting: Internal Medicine

## 2016-02-16 DEATH — deceased

## 2016-02-25 ENCOUNTER — Telehealth: Payer: Self-pay

## 2016-02-25 NOTE — Telephone Encounter (Signed)
On 24-Feb-2016 I received a death certificate from [facility] (original). The death certificate is for cremation. The patient is a patient of Doctor Jenny Reichmann. On 2016-02-24 I received the death certificate back from Doctor Cathlean Cower. I called the funeral home to let them know the death certificate was ready for pickup.

## 2016-09-16 IMAGING — CT CT CHEST W/O CM
2 of 4 series · 16 of 46 positions shown, 18 images · non-contrast
Comparison: 08/12/2014 chest radiograph.  10/02/2013 CTs.

CLINICAL DATA: Restaging of lymphoma. Diagnosed in 1827.
Chemotherapy complete.

EXAM:
CT CHEST, ABDOMEN AND PELVIS WITHOUT CONTRAST
TECHNIQUE: Multidetector CT imaging of the chest, abdomen and pelvis was
performed following the standard protocol without IV contrast.

[Series 2: cap w/o w/o st · axial · non-contrast · 0.91mm/px · z∈[+612,+1252]mm · 13 of 142 slices shown, 15 images]
[im 7/142  soft-tissue]
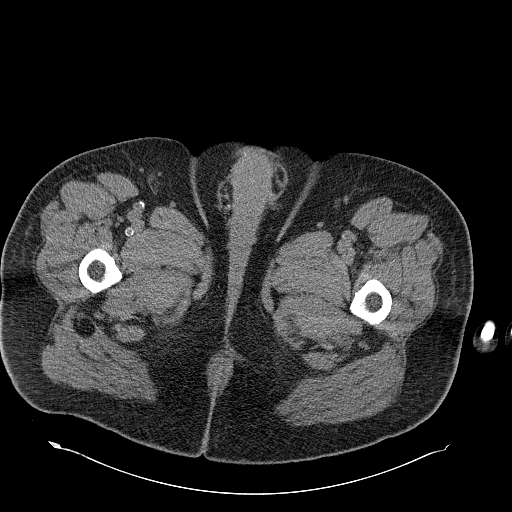
[im 7/142  bone]
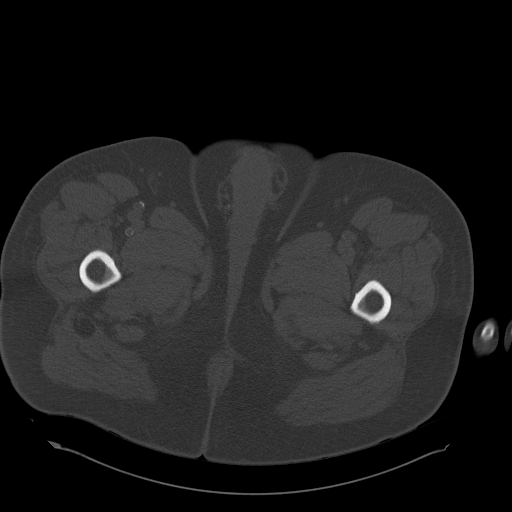
[im 21/142  soft-tissue]
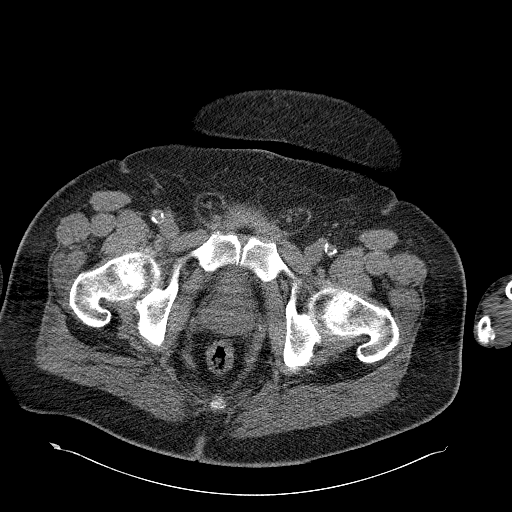
[im 27/142  soft-tissue]
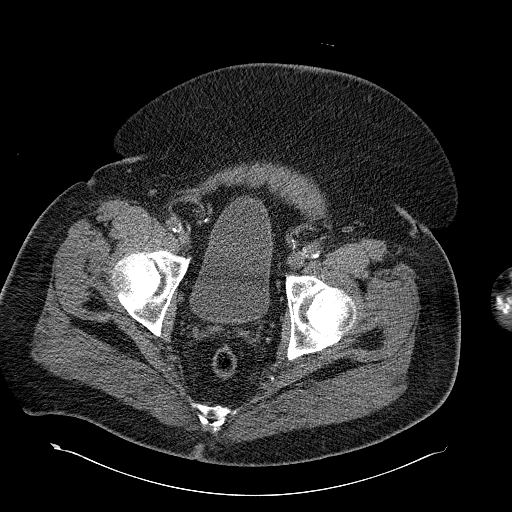
[im 41/142  soft-tissue]
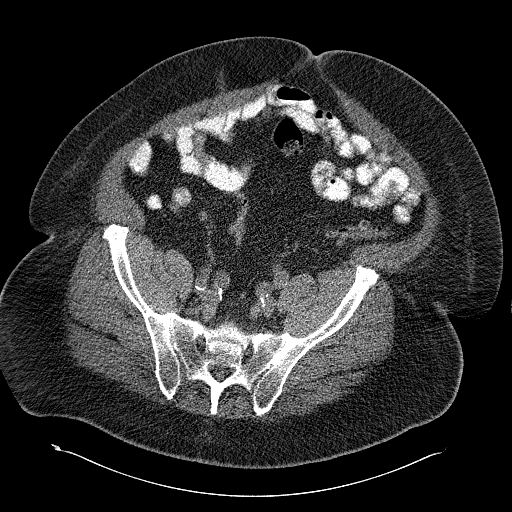
[im 48/142  soft-tissue]
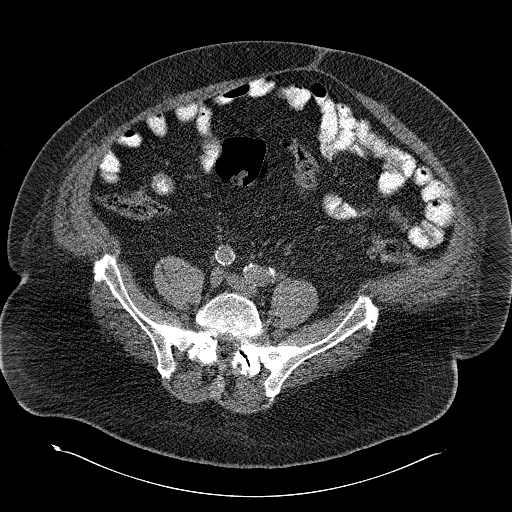
[im 61/142  soft-tissue]
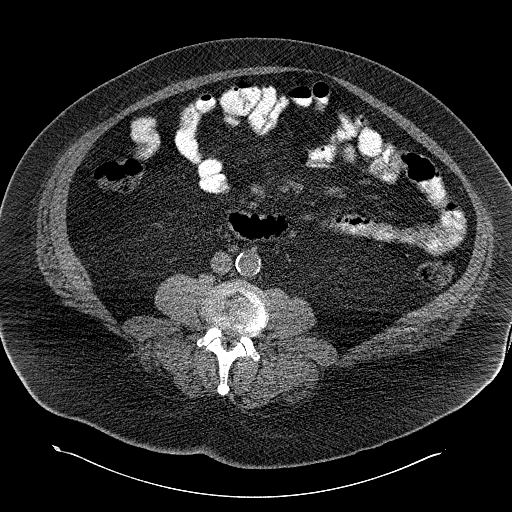
[im 74/142  soft-tissue]
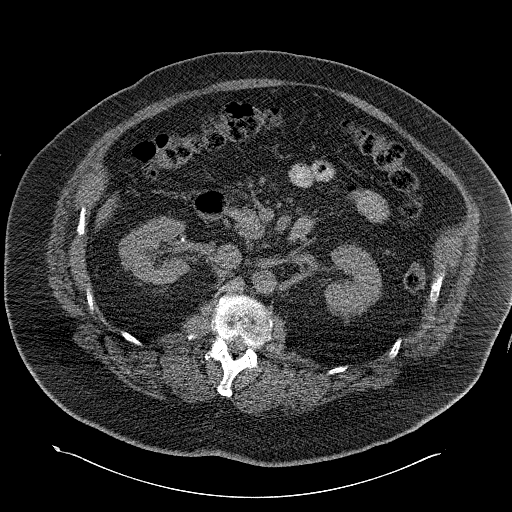
[im 81/142  soft-tissue]
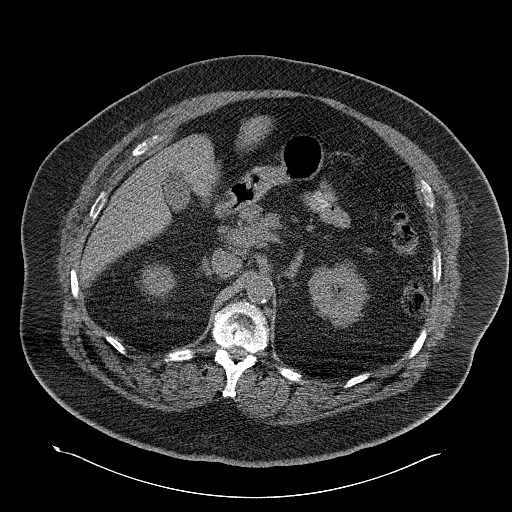
[im 95/142  soft-tissue]
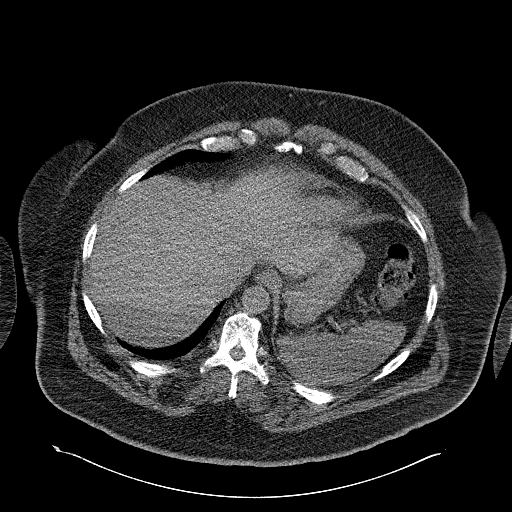
[im 95/142  bone]
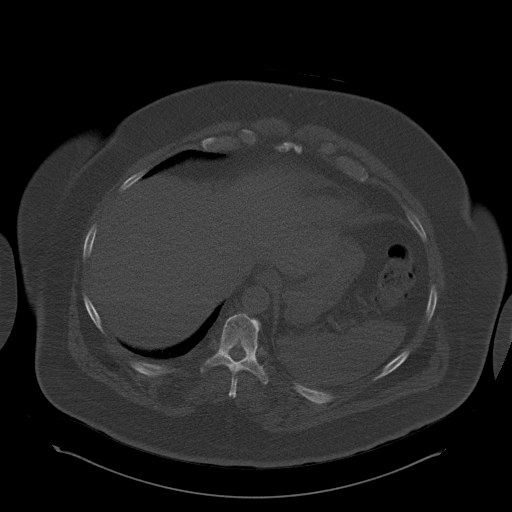
[im 101/142  soft-tissue]
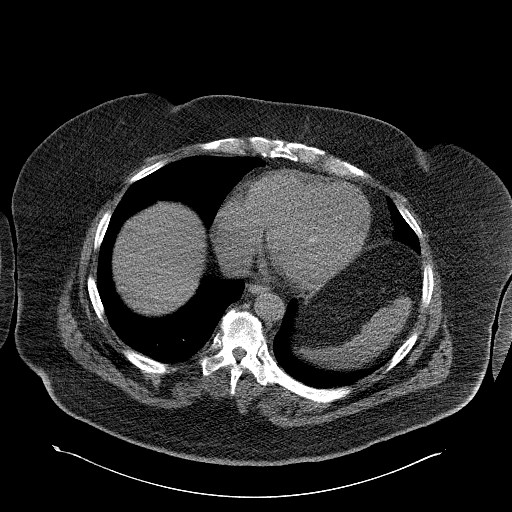
[im 115/142  soft-tissue]
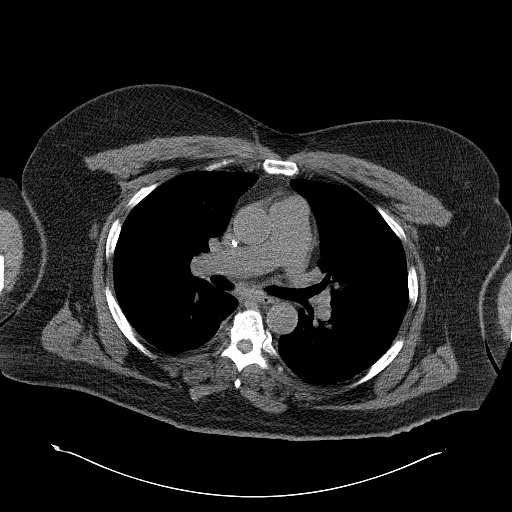
[im 121/142  soft-tissue]
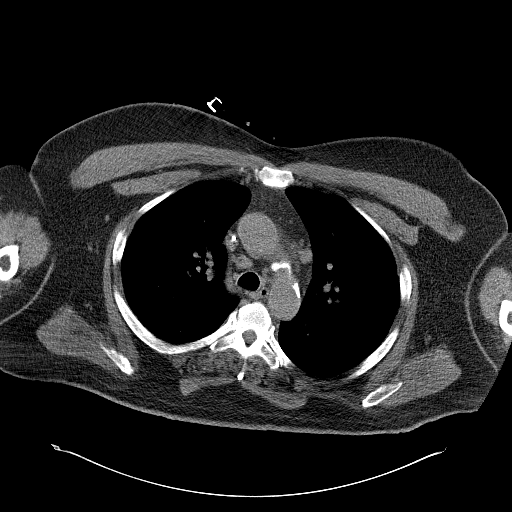
[im 135/142  soft-tissue]
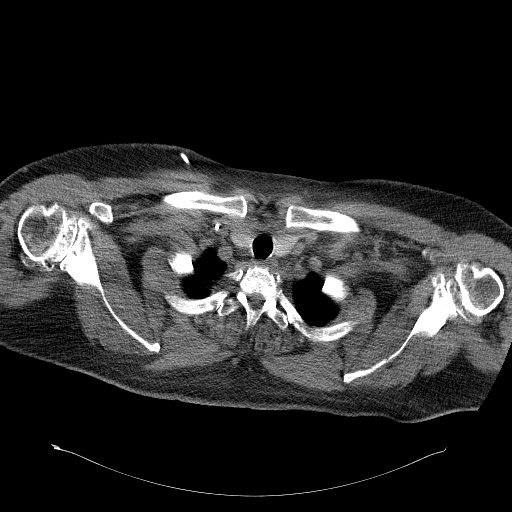

[Series 602: <mpr thick range> · coronal · 1.39mm/px · 3 of 121 slices shown]
[im 41/121  soft-tissue]
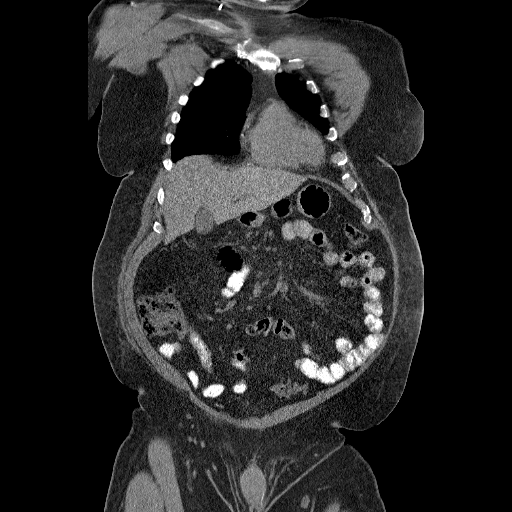
[im 54/121  soft-tissue]
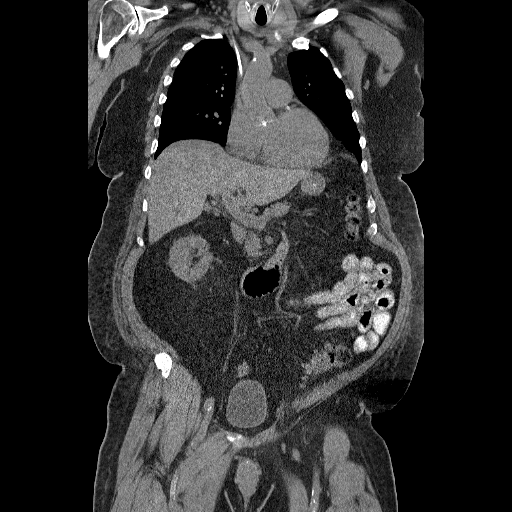
[im 67/121  soft-tissue]
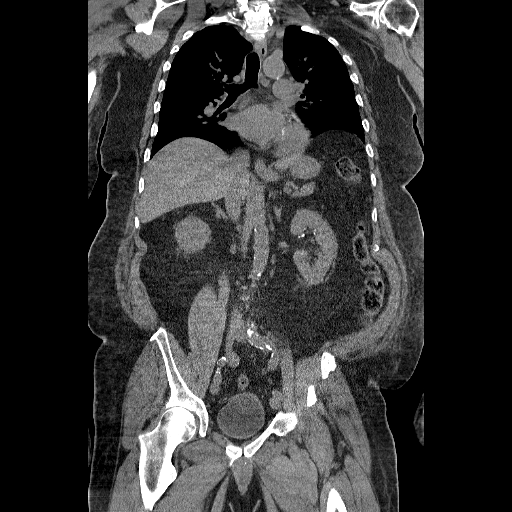

[16 of 46 positions shown; findings below may reference images not displayed]

FINDINGS: CT CHEST FINDINGS

Mediastinum/Nodes: Low left jugular node measures 1.4 cm on image 1
and is new or enlarged since the prior. A right internal jugular
line terminates at the low SVC. Increased number of bilateral
subpectoral nodes, new. Aortic and branch vessel atherosclerosis.
Mild cardiomegaly. Multivessel coronary artery atherosclerosis.
Pulmonary artery enlargement, outflow tract 3.5 cm.

A low right paratracheal node measures 1.3 cm on image 23 versus
cm on the prior

Subcarinal node measures 1.4 cm today versus 1.1 cm on the prior
(when remeasured). Hilar regions poorly evaluated without
intravenous contrast.

Prevascular node measures 1.1 cm on image 22 versus 9 mm on the
prior

Lungs/Pleura: Mild centrilobular emphysema.  No pleural fluid.

Musculoskeletal: No acute osseous abnormality.

CT ABDOMEN PELVIS FINDINGS

Hepatobiliary: Degradation, secondary to patient size and arm
position, not raised above the head. Lack of IV contrast also limits
exam. Grossly normal liver. Normal gallbladder, without biliary
ductal dilatation.

Pancreas: Normal, without mass or ductal dilatation.

Spleen: Normal

Adrenals/Urinary Tract: Normal adrenal glands. Bilateral renal
vascular calcifications. No bladder calculi.

Stomach/Bowel: Normal stomach, without wall thickening. Scattered
colonic diverticula. Normal terminal ileum and appendix. Normal
small bowel.

Vascular/Lymphatic: Aortic and branch vessel atherosclerosis. No
abdominopelvic adenopathy.

Reproductive: Mild prostatomegaly.

Other: No significant free fluid.

Musculoskeletal: Degenerative partial fusion of the bilateral
sacroiliac joints. Bilateral glenohumeral joint osteoarthritis.
IMPRESSION: CT CHEST IMPRESSION

1. Slight progression of thoracic adenopathy with suspicion of
developing lower cervical adenopathy.
2.  Atherosclerosis, including within the coronary arteries.
3. Pulmonary artery enlargement suggests pulmonary arterial
hypertension.

CT ABDOMEN AND PELVIS IMPRESSION

1. No acute process or evidence of active lymphoma within the
abdomen or pelvis.
2. Decreased sensitivity and specificity exam due to technique
related factors, as described above.
